# Patient Record
Sex: Female | Born: 1945 | Race: White | Hispanic: No | Smoking: Former smoker
Health system: Southern US, Community
[De-identification: ages and names within clinical notes are randomized; demographics above are authoritative.]

## PROBLEM LIST (undated history)

## (undated) DIAGNOSIS — E039 Hypothyroidism, unspecified: Secondary | ICD-10-CM

## (undated) DIAGNOSIS — C259 Malignant neoplasm of pancreas, unspecified: Secondary | ICD-10-CM

## (undated) DIAGNOSIS — K219 Gastro-esophageal reflux disease without esophagitis: Secondary | ICD-10-CM

## (undated) DIAGNOSIS — F419 Anxiety disorder, unspecified: Secondary | ICD-10-CM

## (undated) DIAGNOSIS — C73 Malignant neoplasm of thyroid gland: Secondary | ICD-10-CM

## (undated) DIAGNOSIS — Z8489 Family history of other specified conditions: Secondary | ICD-10-CM

## (undated) DIAGNOSIS — D649 Anemia, unspecified: Secondary | ICD-10-CM

## (undated) DIAGNOSIS — E119 Type 2 diabetes mellitus without complications: Secondary | ICD-10-CM

## (undated) DIAGNOSIS — C189 Malignant neoplasm of colon, unspecified: Secondary | ICD-10-CM

## (undated) DIAGNOSIS — I10 Essential (primary) hypertension: Secondary | ICD-10-CM

## (undated) DIAGNOSIS — C649 Malignant neoplasm of unspecified kidney, except renal pelvis: Secondary | ICD-10-CM

## (undated) HISTORY — DX: Malignant neoplasm of unspecified kidney, except renal pelvis: C64.9

## (undated) HISTORY — PX: COLON RESECTION: SHX5231

## (undated) HISTORY — PX: OTHER SURGICAL HISTORY: SHX169

## (undated) HISTORY — PX: CHOLECYSTECTOMY: SHX55

## (undated) HISTORY — PX: THYROID SURGERY: SHX805

---

## 2004-09-07 ENCOUNTER — Ambulatory Visit: Payer: Self-pay | Admitting: Internal Medicine

## 2005-03-09 ENCOUNTER — Ambulatory Visit: Payer: Self-pay | Admitting: Internal Medicine

## 2006-03-27 ENCOUNTER — Ambulatory Visit: Payer: Self-pay | Admitting: Internal Medicine

## 2006-04-25 ENCOUNTER — Ambulatory Visit: Payer: Self-pay | Admitting: Unknown Physician Specialty

## 2006-04-26 ENCOUNTER — Ambulatory Visit: Payer: Self-pay | Admitting: Unknown Physician Specialty

## 2006-04-30 ENCOUNTER — Ambulatory Visit: Payer: Self-pay | Admitting: Internal Medicine

## 2006-05-03 ENCOUNTER — Ambulatory Visit: Payer: Self-pay | Admitting: Surgery

## 2006-05-08 ENCOUNTER — Inpatient Hospital Stay: Payer: Self-pay | Admitting: Surgery

## 2006-05-21 ENCOUNTER — Ambulatory Visit: Payer: Self-pay | Admitting: Oncology

## 2006-05-28 ENCOUNTER — Ambulatory Visit: Payer: Self-pay | Admitting: Oncology

## 2006-08-14 ENCOUNTER — Ambulatory Visit: Payer: Self-pay | Admitting: Oncology

## 2006-08-20 ENCOUNTER — Ambulatory Visit: Payer: Self-pay | Admitting: Oncology

## 2006-08-27 ENCOUNTER — Ambulatory Visit: Payer: Self-pay | Admitting: Oncology

## 2007-01-03 ENCOUNTER — Ambulatory Visit: Payer: Self-pay | Admitting: Unknown Physician Specialty

## 2007-01-27 ENCOUNTER — Ambulatory Visit: Payer: Self-pay | Admitting: Oncology

## 2007-02-14 ENCOUNTER — Ambulatory Visit: Payer: Self-pay | Admitting: Oncology

## 2007-02-27 ENCOUNTER — Ambulatory Visit: Payer: Self-pay | Admitting: Oncology

## 2007-03-30 ENCOUNTER — Ambulatory Visit: Payer: Self-pay | Admitting: Oncology

## 2007-05-06 ENCOUNTER — Ambulatory Visit: Payer: Self-pay | Admitting: Internal Medicine

## 2007-05-06 ENCOUNTER — Ambulatory Visit: Payer: Self-pay | Admitting: Oncology

## 2007-05-13 ENCOUNTER — Ambulatory Visit: Payer: Self-pay | Admitting: Oncology

## 2007-05-28 ENCOUNTER — Ambulatory Visit: Payer: Self-pay | Admitting: Oncology

## 2007-06-27 ENCOUNTER — Ambulatory Visit: Payer: Self-pay | Admitting: Oncology

## 2007-07-28 ENCOUNTER — Ambulatory Visit: Payer: Self-pay | Admitting: Oncology

## 2007-08-26 ENCOUNTER — Ambulatory Visit: Payer: Self-pay | Admitting: Oncology

## 2007-08-27 ENCOUNTER — Ambulatory Visit: Payer: Self-pay | Admitting: Oncology

## 2007-11-27 ENCOUNTER — Ambulatory Visit: Payer: Self-pay | Admitting: Oncology

## 2007-12-14 ENCOUNTER — Ambulatory Visit: Payer: Self-pay | Admitting: Family Medicine

## 2007-12-28 ENCOUNTER — Ambulatory Visit: Payer: Self-pay | Admitting: Oncology

## 2008-05-06 ENCOUNTER — Ambulatory Visit: Payer: Self-pay | Admitting: Internal Medicine

## 2008-05-27 ENCOUNTER — Ambulatory Visit: Payer: Self-pay | Admitting: Oncology

## 2008-06-08 ENCOUNTER — Ambulatory Visit: Payer: Self-pay | Admitting: Oncology

## 2008-06-26 ENCOUNTER — Ambulatory Visit: Payer: Self-pay | Admitting: Oncology

## 2008-10-27 ENCOUNTER — Ambulatory Visit: Payer: Self-pay | Admitting: Oncology

## 2008-11-23 ENCOUNTER — Ambulatory Visit: Payer: Self-pay | Admitting: Oncology

## 2008-11-26 ENCOUNTER — Ambulatory Visit: Payer: Self-pay | Admitting: Oncology

## 2009-01-24 ENCOUNTER — Ambulatory Visit: Payer: Self-pay | Admitting: Unknown Physician Specialty

## 2009-04-26 ENCOUNTER — Ambulatory Visit: Payer: Self-pay | Admitting: Oncology

## 2009-05-10 ENCOUNTER — Ambulatory Visit: Payer: Self-pay | Admitting: Internal Medicine

## 2009-05-10 ENCOUNTER — Ambulatory Visit: Payer: Self-pay | Admitting: Oncology

## 2009-05-27 ENCOUNTER — Ambulatory Visit: Payer: Self-pay | Admitting: Oncology

## 2009-10-27 ENCOUNTER — Ambulatory Visit: Payer: Self-pay | Admitting: Oncology

## 2009-11-01 ENCOUNTER — Ambulatory Visit: Payer: Self-pay | Admitting: Oncology

## 2009-11-26 ENCOUNTER — Ambulatory Visit: Payer: Self-pay | Admitting: Oncology

## 2010-02-01 ENCOUNTER — Ambulatory Visit: Payer: Self-pay | Admitting: Oncology

## 2010-02-26 ENCOUNTER — Ambulatory Visit: Payer: Self-pay | Admitting: Oncology

## 2010-05-03 ENCOUNTER — Ambulatory Visit: Payer: Self-pay | Admitting: Oncology

## 2010-05-10 ENCOUNTER — Ambulatory Visit: Payer: Self-pay | Admitting: Oncology

## 2010-05-11 LAB — CEA: CEA: 1.6 ng/mL (ref 0.0–4.7)

## 2010-05-17 ENCOUNTER — Ambulatory Visit: Payer: Self-pay | Admitting: Unknown Physician Specialty

## 2010-05-28 ENCOUNTER — Ambulatory Visit: Payer: Self-pay | Admitting: Oncology

## 2010-05-30 ENCOUNTER — Ambulatory Visit: Payer: Self-pay | Admitting: Unknown Physician Specialty

## 2010-05-30 ENCOUNTER — Ambulatory Visit: Payer: Self-pay | Admitting: Surgery

## 2010-06-06 ENCOUNTER — Ambulatory Visit: Payer: Self-pay | Admitting: Surgery

## 2010-07-03 ENCOUNTER — Ambulatory Visit: Payer: Self-pay | Admitting: General Practice

## 2010-07-17 ENCOUNTER — Inpatient Hospital Stay: Payer: Self-pay | Admitting: General Practice

## 2010-11-15 ENCOUNTER — Ambulatory Visit: Payer: Self-pay | Admitting: Oncology

## 2010-11-27 ENCOUNTER — Ambulatory Visit: Payer: Self-pay | Admitting: Oncology

## 2011-01-05 ENCOUNTER — Ambulatory Visit: Payer: Self-pay | Admitting: Internal Medicine

## 2011-03-14 ENCOUNTER — Ambulatory Visit: Payer: Self-pay | Admitting: Oncology

## 2011-03-14 LAB — COMPREHENSIVE METABOLIC PANEL
Albumin: 3.7 g/dL (ref 3.4–5.0)
Anion Gap: 12 (ref 7–16)
BUN: 15 mg/dL (ref 7–18)
Calcium, Total: 9.2 mg/dL (ref 8.5–10.1)
EGFR (African American): 55 — ABNORMAL LOW
EGFR (Non-African Amer.): 45 — ABNORMAL LOW
Glucose: 198 mg/dL — ABNORMAL HIGH (ref 65–99)
Osmolality: 282 (ref 275–301)
Potassium: 4.4 mmol/L (ref 3.5–5.1)
Sodium: 138 mmol/L (ref 136–145)
Total Protein: 7.5 g/dL (ref 6.4–8.2)

## 2011-03-14 LAB — CBC CANCER CENTER
Basophil #: 0 x10 3/mm (ref 0.0–0.1)
Basophil %: 0.4 %
Eosinophil #: 0.2 x10 3/mm (ref 0.0–0.7)
Eosinophil %: 1.6 %
HGB: 11.4 g/dL — ABNORMAL LOW (ref 12.0–16.0)
Lymphocyte #: 1.7 x10 3/mm (ref 1.0–3.6)
Lymphocyte %: 18 %
Monocyte #: 0.3 x10 3/mm (ref 0.0–0.7)
Monocyte %: 3.2 %
Neutrophil %: 76.8 %
Platelet: 265 x10 3/mm (ref 150–440)
RBC: 4.34 10*6/uL (ref 3.80–5.20)
WBC: 9.6 x10 3/mm (ref 3.6–11.0)

## 2011-03-30 ENCOUNTER — Ambulatory Visit: Payer: Self-pay | Admitting: Oncology

## 2011-06-05 ENCOUNTER — Ambulatory Visit: Payer: Self-pay | Admitting: Internal Medicine

## 2011-09-21 ENCOUNTER — Ambulatory Visit: Payer: Self-pay | Admitting: Oncology

## 2011-09-21 LAB — CBC CANCER CENTER
Basophil %: 0.8 %
Eosinophil %: 1.9 %
HCT: 33 % — ABNORMAL LOW (ref 35.0–47.0)
Lymphocyte #: 1.8 x10 3/mm (ref 1.0–3.6)
MCHC: 32.3 g/dL (ref 32.0–36.0)
MCV: 84 fL (ref 80–100)
Monocyte #: 0.4 x10 3/mm (ref 0.2–0.9)
Neutrophil #: 6.5 x10 3/mm (ref 1.4–6.5)
Platelet: 210 x10 3/mm (ref 150–440)
RBC: 3.93 10*6/uL (ref 3.80–5.20)
WBC: 8.8 x10 3/mm (ref 3.6–11.0)

## 2011-09-23 LAB — CEA: CEA: 1.4 ng/mL (ref 0.0–4.7)

## 2011-09-27 ENCOUNTER — Ambulatory Visit: Payer: Self-pay | Admitting: Oncology

## 2012-03-21 ENCOUNTER — Ambulatory Visit: Payer: Self-pay | Admitting: Oncology

## 2012-03-21 LAB — CBC CANCER CENTER
Basophil #: 0.1 x10 3/mm (ref 0.0–0.1)
Basophil %: 1.4 %
HCT: 36.5 % (ref 35.0–47.0)
HGB: 11.6 g/dL — ABNORMAL LOW (ref 12.0–16.0)
Lymphocyte #: 1.4 x10 3/mm (ref 1.0–3.6)
Lymphocyte %: 12.8 %
MCH: 26.3 pg (ref 26.0–34.0)
Monocyte #: 0.4 x10 3/mm (ref 0.2–0.9)
Monocyte %: 3.4 %
RBC: 4.4 10*6/uL (ref 3.80–5.20)
WBC: 10.8 x10 3/mm (ref 3.6–11.0)

## 2012-03-21 LAB — COMPREHENSIVE METABOLIC PANEL
Albumin: 3.9 g/dL (ref 3.4–5.0)
Alkaline Phosphatase: 79 U/L (ref 50–136)
Anion Gap: 12 (ref 7–16)
BUN: 16 mg/dL (ref 7–18)
Bilirubin,Total: 0.3 mg/dL (ref 0.2–1.0)
Chloride: 103 mmol/L (ref 98–107)
Co2: 25 mmol/L (ref 21–32)
Creatinine: 1.3 mg/dL (ref 0.60–1.30)
EGFR (Non-African Amer.): 42 — ABNORMAL LOW
Glucose: 221 mg/dL — ABNORMAL HIGH (ref 65–99)
Osmolality: 287 (ref 275–301)
Potassium: 4.9 mmol/L (ref 3.5–5.1)
SGPT (ALT): 20 U/L (ref 12–78)
Total Protein: 7.7 g/dL (ref 6.4–8.2)

## 2012-03-24 LAB — CEA: CEA: 1.8 ng/mL (ref 0.0–4.7)

## 2012-03-29 ENCOUNTER — Ambulatory Visit: Payer: Self-pay | Admitting: Oncology

## 2012-06-05 ENCOUNTER — Ambulatory Visit: Payer: Self-pay | Admitting: Internal Medicine

## 2012-06-06 ENCOUNTER — Ambulatory Visit: Payer: Self-pay | Admitting: Internal Medicine

## 2012-06-24 ENCOUNTER — Ambulatory Visit: Payer: Self-pay | Admitting: Internal Medicine

## 2012-06-27 ENCOUNTER — Ambulatory Visit: Payer: Self-pay | Admitting: Internal Medicine

## 2012-06-30 ENCOUNTER — Ambulatory Visit: Payer: Self-pay | Admitting: Unknown Physician Specialty

## 2012-07-01 LAB — PATHOLOGY REPORT

## 2012-09-10 ENCOUNTER — Ambulatory Visit: Payer: Self-pay | Admitting: Oncology

## 2012-09-12 ENCOUNTER — Ambulatory Visit: Payer: Self-pay | Admitting: Oncology

## 2012-09-12 LAB — CBC CANCER CENTER
Basophil #: 0.1 x10 3/mm (ref 0.0–0.1)
Eosinophil %: 2.1 %
HCT: 32.2 % — ABNORMAL LOW (ref 35.0–47.0)
HGB: 10.6 g/dL — ABNORMAL LOW (ref 12.0–16.0)
MCV: 80 fL (ref 80–100)
Monocyte %: 5 %
Neutrophil #: 6.3 x10 3/mm (ref 1.4–6.5)
Neutrophil %: 74.7 %
WBC: 8.5 x10 3/mm (ref 3.6–11.0)

## 2012-09-12 LAB — COMPREHENSIVE METABOLIC PANEL
Albumin: 3.9 g/dL (ref 3.4–5.0)
Alkaline Phosphatase: 70 U/L (ref 50–136)
Anion Gap: 10 (ref 7–16)
BUN: 15 mg/dL (ref 7–18)
Bilirubin,Total: 0.3 mg/dL (ref 0.2–1.0)
Chloride: 104 mmol/L (ref 98–107)
Creatinine: 1.29 mg/dL (ref 0.60–1.30)
EGFR (African American): 49 — ABNORMAL LOW
Osmolality: 287 (ref 275–301)
Potassium: 5.2 mmol/L — ABNORMAL HIGH (ref 3.5–5.1)
SGOT(AST): 22 U/L (ref 15–37)
Sodium: 139 mmol/L (ref 136–145)
Total Protein: 7.7 g/dL (ref 6.4–8.2)

## 2012-09-13 LAB — CEA: CEA: 1.5 ng/mL (ref 0.0–4.7)

## 2012-09-26 ENCOUNTER — Ambulatory Visit: Payer: Self-pay | Admitting: Oncology

## 2013-03-13 ENCOUNTER — Ambulatory Visit: Payer: Self-pay | Admitting: Oncology

## 2013-03-13 LAB — COMPREHENSIVE METABOLIC PANEL
AST: 24 U/L (ref 15–37)
Albumin: 3.7 g/dL (ref 3.4–5.0)
Alkaline Phosphatase: 68 U/L
Anion Gap: 10 (ref 7–16)
BUN: 15 mg/dL (ref 7–18)
Bilirubin,Total: 0.3 mg/dL (ref 0.2–1.0)
CO2: 24 mmol/L (ref 21–32)
CREATININE: 1.26 mg/dL (ref 0.60–1.30)
Calcium, Total: 9 mg/dL (ref 8.5–10.1)
Chloride: 100 mmol/L (ref 98–107)
EGFR (African American): 50 — ABNORMAL LOW
EGFR (Non-African Amer.): 43 — ABNORMAL LOW
Glucose: 309 mg/dL — ABNORMAL HIGH (ref 65–99)
Osmolality: 281 (ref 275–301)
Potassium: 5.1 mmol/L (ref 3.5–5.1)
SGPT (ALT): 23 U/L (ref 12–78)
SODIUM: 134 mmol/L — AB (ref 136–145)
TOTAL PROTEIN: 7.3 g/dL (ref 6.4–8.2)

## 2013-03-13 LAB — CBC CANCER CENTER
BASOS PCT: 1 %
Basophil #: 0.1 x10 3/mm (ref 0.0–0.1)
Eosinophil #: 0.2 x10 3/mm (ref 0.0–0.7)
Eosinophil %: 1.6 %
HCT: 28.3 % — ABNORMAL LOW (ref 35.0–47.0)
HGB: 8.7 g/dL — ABNORMAL LOW (ref 12.0–16.0)
LYMPHS PCT: 14.4 %
Lymphocyte #: 1.4 x10 3/mm (ref 1.0–3.6)
MCH: 23.1 pg — ABNORMAL LOW (ref 26.0–34.0)
MCHC: 30.8 g/dL — ABNORMAL LOW (ref 32.0–36.0)
MCV: 75 fL — ABNORMAL LOW (ref 80–100)
MONO ABS: 0.4 x10 3/mm (ref 0.2–0.9)
Monocyte %: 4.4 %
NEUTROS ABS: 7.8 x10 3/mm — AB (ref 1.4–6.5)
Neutrophil %: 78.6 %
Platelet: 242 x10 3/mm (ref 150–440)
RBC: 3.77 10*6/uL — ABNORMAL LOW (ref 3.80–5.20)
RDW: 16.1 % — ABNORMAL HIGH (ref 11.5–14.5)
WBC: 10 x10 3/mm (ref 3.6–11.0)

## 2013-03-29 ENCOUNTER — Ambulatory Visit: Payer: Self-pay | Admitting: Oncology

## 2013-06-05 ENCOUNTER — Ambulatory Visit: Payer: Self-pay | Admitting: Oncology

## 2013-06-08 ENCOUNTER — Ambulatory Visit: Payer: Self-pay | Admitting: Internal Medicine

## 2013-10-02 ENCOUNTER — Ambulatory Visit: Payer: Self-pay | Admitting: Oncology

## 2013-10-02 LAB — COMPREHENSIVE METABOLIC PANEL
ANION GAP: 10 (ref 7–16)
Albumin: 3.6 g/dL (ref 3.4–5.0)
Alkaline Phosphatase: 62 U/L
BUN: 12 mg/dL (ref 7–18)
Bilirubin,Total: 0.3 mg/dL (ref 0.2–1.0)
CHLORIDE: 101 mmol/L (ref 98–107)
CO2: 26 mmol/L (ref 21–32)
Calcium, Total: 8.8 mg/dL (ref 8.5–10.1)
Creatinine: 1.12 mg/dL (ref 0.60–1.30)
EGFR (African American): 58 — ABNORMAL LOW
EGFR (Non-African Amer.): 50 — ABNORMAL LOW
GLUCOSE: 293 mg/dL — AB (ref 65–99)
Osmolality: 284 (ref 275–301)
POTASSIUM: 5 mmol/L (ref 3.5–5.1)
SGOT(AST): 28 U/L (ref 15–37)
SGPT (ALT): 22 U/L
Sodium: 137 mmol/L (ref 136–145)
TOTAL PROTEIN: 7.5 g/dL (ref 6.4–8.2)

## 2013-10-02 LAB — CBC CANCER CENTER
BASOS ABS: 0.1 x10 3/mm (ref 0.0–0.1)
Basophil %: 0.8 %
Eosinophil #: 0.1 x10 3/mm (ref 0.0–0.7)
Eosinophil %: 1.5 %
HCT: 29.2 % — ABNORMAL LOW (ref 35.0–47.0)
HGB: 9.1 g/dL — ABNORMAL LOW (ref 12.0–16.0)
LYMPHS ABS: 1.3 x10 3/mm (ref 1.0–3.6)
Lymphocyte %: 14.1 %
MCH: 23.8 pg — AB (ref 26.0–34.0)
MCHC: 31.1 g/dL — AB (ref 32.0–36.0)
MCV: 77 fL — ABNORMAL LOW (ref 80–100)
MONOS PCT: 4.5 %
Monocyte #: 0.4 x10 3/mm (ref 0.2–0.9)
NEUTROS ABS: 7 x10 3/mm — AB (ref 1.4–6.5)
Neutrophil %: 79.1 %
PLATELETS: 235 x10 3/mm (ref 150–440)
RBC: 3.81 10*6/uL (ref 3.80–5.20)
RDW: 19.5 % — AB (ref 11.5–14.5)
WBC: 8.9 x10 3/mm (ref 3.6–11.0)

## 2013-10-02 LAB — FERRITIN: Ferritin (ARMC): 8 ng/mL (ref 8–388)

## 2013-10-02 LAB — IRON AND TIBC
IRON BIND. CAP.(TOTAL): 539 ug/dL — AB (ref 250–450)
IRON: 55 ug/dL (ref 50–170)
Iron Saturation: 10 %
Unbound Iron-Bind.Cap.: 484 ug/dL

## 2013-10-27 ENCOUNTER — Ambulatory Visit: Payer: Self-pay | Admitting: Oncology

## 2013-12-25 ENCOUNTER — Ambulatory Visit: Payer: Self-pay | Admitting: Unknown Physician Specialty

## 2013-12-31 ENCOUNTER — Ambulatory Visit: Payer: Self-pay | Admitting: Oncology

## 2013-12-31 LAB — COMPREHENSIVE METABOLIC PANEL
Albumin: 3.5 g/dL (ref 3.4–5.0)
Alkaline Phosphatase: 69 U/L
Anion Gap: 13 (ref 7–16)
BUN: 15 mg/dL (ref 7–18)
Bilirubin,Total: 0.5 mg/dL (ref 0.2–1.0)
CHLORIDE: 99 mmol/L (ref 98–107)
CO2: 24 mmol/L (ref 21–32)
Calcium, Total: 9.9 mg/dL (ref 8.5–10.1)
Creatinine: 1.03 mg/dL (ref 0.60–1.30)
EGFR (Non-African Amer.): 56 — ABNORMAL LOW
GLUCOSE: 226 mg/dL — AB (ref 65–99)
OSMOLALITY: 280 (ref 275–301)
Potassium: 4.8 mmol/L (ref 3.5–5.1)
SGOT(AST): 18 U/L (ref 15–37)
SGPT (ALT): 21 U/L
Sodium: 136 mmol/L (ref 136–145)
Total Protein: 7.5 g/dL (ref 6.4–8.2)

## 2013-12-31 LAB — IRON AND TIBC
IRON SATURATION: 9 %
IRON: 35 ug/dL — AB (ref 50–170)
Iron Bind.Cap.(Total): 408 ug/dL (ref 250–450)
UNBOUND IRON-BIND. CAP.: 373 ug/dL

## 2013-12-31 LAB — CBC CANCER CENTER
BASOS ABS: 0.1 x10 3/mm (ref 0.0–0.1)
BASOS PCT: 0.8 %
EOS PCT: 1.2 %
Eosinophil #: 0.1 x10 3/mm (ref 0.0–0.7)
HCT: 36.1 % (ref 35.0–47.0)
HGB: 11.6 g/dL — ABNORMAL LOW (ref 12.0–16.0)
LYMPHS ABS: 1.3 x10 3/mm (ref 1.0–3.6)
Lymphocyte %: 12.3 %
MCH: 27.7 pg (ref 26.0–34.0)
MCHC: 32.2 g/dL (ref 32.0–36.0)
MCV: 86 fL (ref 80–100)
Monocyte #: 0.5 x10 3/mm (ref 0.2–0.9)
Monocyte %: 4.8 %
NEUTROS ABS: 8.4 x10 3/mm — AB (ref 1.4–6.5)
Neutrophil %: 80.9 %
PLATELETS: 202 x10 3/mm (ref 150–440)
RBC: 4.19 10*6/uL (ref 3.80–5.20)
RDW: 16.3 % — AB (ref 11.5–14.5)
WBC: 10.4 x10 3/mm (ref 3.6–11.0)

## 2013-12-31 LAB — FERRITIN: FERRITIN (ARMC): 37 ng/mL (ref 8–388)

## 2014-01-26 ENCOUNTER — Ambulatory Visit: Payer: Self-pay | Admitting: Oncology

## 2014-02-02 ENCOUNTER — Ambulatory Visit: Payer: Self-pay | Admitting: Surgery

## 2014-02-02 LAB — PROTIME-INR
INR: 1.1
PROTHROMBIN TIME: 13.9 s (ref 11.5–14.7)

## 2014-02-02 LAB — CBC WITH DIFFERENTIAL/PLATELET
BASOS ABS: 0.1 10*3/uL (ref 0.0–0.1)
BASOS PCT: 0.9 %
Eosinophil #: 0.1 10*3/uL (ref 0.0–0.7)
Eosinophil %: 1.6 %
HCT: 36.2 % (ref 35.0–47.0)
HGB: 11.7 g/dL — AB (ref 12.0–16.0)
LYMPHS ABS: 1.8 10*3/uL (ref 1.0–3.6)
Lymphocyte %: 19.5 %
MCH: 28 pg (ref 26.0–34.0)
MCHC: 32.3 g/dL (ref 32.0–36.0)
MCV: 87 fL (ref 80–100)
MONO ABS: 0.4 x10 3/mm (ref 0.2–0.9)
MONOS PCT: 4.5 %
NEUTROS PCT: 73.5 %
Neutrophil #: 6.8 10*3/uL — ABNORMAL HIGH (ref 1.4–6.5)
Platelet: 224 10*3/uL (ref 150–440)
RBC: 4.19 10*6/uL (ref 3.80–5.20)
RDW: 14.2 % (ref 11.5–14.5)
WBC: 9.2 10*3/uL (ref 3.6–11.0)

## 2014-02-02 LAB — BASIC METABOLIC PANEL
ANION GAP: 11 (ref 7–16)
BUN: 15 mg/dL (ref 7–18)
CALCIUM: 8.6 mg/dL (ref 8.5–10.1)
Chloride: 105 mmol/L (ref 98–107)
Co2: 23 mmol/L (ref 21–32)
Creatinine: 1.09 mg/dL (ref 0.60–1.30)
EGFR (African American): >60
EGFR (Non-African Amer.): 53 — ABNORMAL LOW
Glucose: 153 mg/dL — ABNORMAL HIGH (ref 65–99)
Osmolality: 281 (ref 275–301)
POTASSIUM: 4.6 mmol/L (ref 3.5–5.1)
Sodium: 139 mmol/L (ref 136–145)

## 2014-02-02 LAB — APTT: Activated PTT: 31.4 secs (ref 23.6–35.9)

## 2014-02-02 LAB — HEPATIC FUNCTION PANEL A (ARMC)
ALK PHOS: 72 U/L
Albumin: 3.6 g/dL (ref 3.4–5.0)
Bilirubin, Direct: <0.1 mg/dL (ref 0.0–0.2)
Bilirubin,Total: 0.2 mg/dL (ref 0.2–1.0)
SGOT(AST): 35 U/L (ref 15–37)
SGPT (ALT): 27 U/L
Total Protein: 7.3 g/dL (ref 6.4–8.2)

## 2014-02-04 DIAGNOSIS — D638 Anemia in other chronic diseases classified elsewhere: Secondary | ICD-10-CM | POA: Insufficient documentation

## 2014-02-09 ENCOUNTER — Ambulatory Visit: Payer: Self-pay | Admitting: Surgery

## 2014-02-10 LAB — ALBUMIN: ALBUMIN: 3.1 g/dL — AB (ref 3.4–5.0)

## 2014-02-10 LAB — CALCIUM: Calcium, Total: 8.1 mg/dL — ABNORMAL LOW (ref 8.5–10.1)

## 2014-03-03 ENCOUNTER — Ambulatory Visit: Payer: Self-pay | Admitting: Oncology

## 2014-03-05 ENCOUNTER — Ambulatory Visit: Payer: Self-pay | Admitting: Oncology

## 2014-03-05 LAB — CBC CANCER CENTER
BASOS ABS: 0.1 x10 3/mm (ref 0.0–0.1)
Basophil %: 1.1 %
EOS ABS: 0.3 x10 3/mm (ref 0.0–0.7)
Eosinophil %: 2.9 %
HCT: 34.9 % — ABNORMAL LOW (ref 35.0–47.0)
HGB: 11.2 g/dL — ABNORMAL LOW (ref 12.0–16.0)
LYMPHS PCT: 16.6 %
Lymphocyte #: 1.7 x10 3/mm (ref 1.0–3.6)
MCH: 27.2 pg (ref 26.0–34.0)
MCHC: 32.1 g/dL (ref 32.0–36.0)
MCV: 85 fL (ref 80–100)
MONO ABS: 0.4 x10 3/mm (ref 0.2–0.9)
Monocyte %: 3.8 %
NEUTROS PCT: 75.6 %
Neutrophil #: 7.9 x10 3/mm — ABNORMAL HIGH (ref 1.4–6.5)
PLATELETS: 217 x10 3/mm (ref 150–440)
RBC: 4.13 10*6/uL (ref 3.80–5.20)
RDW: 15.4 % — AB (ref 11.5–14.5)
WBC: 10.5 x10 3/mm (ref 3.6–11.0)

## 2014-03-05 LAB — BASIC METABOLIC PANEL
Anion Gap: 10 (ref 7–16)
BUN: 20 mg/dL — ABNORMAL HIGH (ref 7–18)
CHLORIDE: 103 mmol/L (ref 98–107)
CO2: 25 mmol/L (ref 21–32)
CREATININE: 1.46 mg/dL — AB (ref 0.60–1.30)
Calcium, Total: 9.5 mg/dL (ref 8.5–10.1)
EGFR (African American): 46 — ABNORMAL LOW
EGFR (Non-African Amer.): 38 — ABNORMAL LOW
Glucose: 200 mg/dL — ABNORMAL HIGH (ref 65–99)
Osmolality: 284 (ref 275–301)
Potassium: 4.6 mmol/L (ref 3.5–5.1)
SODIUM: 138 mmol/L (ref 136–145)

## 2014-03-29 ENCOUNTER — Ambulatory Visit: Payer: Self-pay | Admitting: Oncology

## 2014-03-30 ENCOUNTER — Ambulatory Visit: Payer: Self-pay | Admitting: Internal Medicine

## 2014-06-19 NOTE — Op Note (Signed)
PATIENT NAME:  Rebecca Kirby, Rebecca Kirby MR#:  [id number] DATE OF BIRTH:  May 12, 1945  DATE OF PROCEDURE:  02/09/2014  PREOPERATIVE DIAGNOSIS: Thyroid nodules.   POSTOPERATIVE DIAGNOSIS:  Thyroid nodules.  OPERATION PERFORMED:  1.  Total thyroidectomy.  2.  Reimplantation of right superior parathyroid into right sternocleidomastoid muscle.   SURGEON: Consuela Mimes, M.D.   FIRST ASSISTANT:  Dr. Marlyce Huge.   ANESTHESIA: General.   PROCEDURE IN DETAIL: The patient was placed supine on the Operating Room table and prepped and draped in the usual sterile fashion. A curvilinear incision was made 2 fingerbreadths above the suprasternal notch in the neck and carried down through the skin and subcutaneous tissue and platysma with electrocautery. Subplatysmal flaps are created superiorly and inferiorly and the intermediate fascia was opened in the midline between the strap muscles. Strap muscles were dissected off of the thyroid lobes bilaterally, and they were densely adherent on both sides with some reactive vasculature, likely due to the fine needle aspirates. The superior pole vessels were ligated and divided with the Harmonic scalpel. On the right one of the superior pole vessels was also clipped with a medium Hemoclip as it was quite sizable. The middle thyroid veins were ligated and divided with the Harmonic scalpel and inferior pole vessels were treated identically. As the thyroid lobes were rotated medially the recurrent laryngeal nerve was identified and spared throughout the procedure. No energy devices were used near the nerve. Rather, medium and small hemoclips were used as well as the Harmonic scalpel and the bipolar electrocautery, when necessary. The entire thyroid was removed, including some nodules within the isthmus and a pyramidal lobe and passed off the table as a specimen. Hemostasis was excellent and was aided with topical thrombin-soaked Gelfoam pads that were placed right on the  recurrent laryngeal nerves but were removed prior to closure. On the right side the right superior parathyroid gland was excised with the specimen and therefore, it was elected to reimplant this even though the remaining three parathyroid glands remained intact in the patient. After a small TLS drain was placed on both sides of the neck and brought out through a stab incision in the suprasternal notch and the intermediate fascia was closed with a running 3-0 Monocryl suture, and then the right superior parathyroid gland was minced into 1 mm cubes and placed into a pocket in the right sternocleidomastoid muscle and the fascia over top of this pocket was closed with a running 5-0 Prolene suture. The platysma was then closed with a running 3-0 Monocryl suture and the skin was reapproximated with a running subcuticular 5-0 Monocryl and suture strips. The patient tolerated the procedure well. There were no complications.     ____________________________ Consuela Mimes, MD wfm:at D: 02/09/2014 15:45:19 ET T: 02/09/2014 15:56:51 ET JOB#: [id number]  cc: Consuela Mimes, MD, <Dictator> A. Lavone Orn, MD Consuela Mimes MD ELECTRONICALLY SIGNED 02/12/2014 9:38

## 2014-06-21 LAB — SURGICAL PATHOLOGY

## 2014-07-28 ENCOUNTER — Inpatient Hospital Stay: Admission: RE | Admit: 2014-07-28 | Payer: Self-pay | Source: Ambulatory Visit

## 2014-07-29 ENCOUNTER — Encounter
Admission: RE | Admit: 2014-07-29 | Discharge: 2014-07-29 | Disposition: A | Discharge status: Home / Self Care | Payer: Medicare Other | Source: Ambulatory Visit | Attending: Orthopedic Surgery | Admitting: Orthopedic Surgery

## 2014-07-29 DIAGNOSIS — Z01810 Encounter for preprocedural cardiovascular examination: Secondary | ICD-10-CM | POA: Insufficient documentation

## 2014-07-29 DIAGNOSIS — Z01812 Encounter for preprocedural laboratory examination: Secondary | ICD-10-CM | POA: Insufficient documentation

## 2014-07-29 DIAGNOSIS — M179 Osteoarthritis of knee, unspecified: Secondary | ICD-10-CM | POA: Insufficient documentation

## 2014-07-29 HISTORY — DX: Gastro-esophageal reflux disease without esophagitis: K21.9

## 2014-07-29 HISTORY — DX: Essential (primary) hypertension: I10

## 2014-07-29 HISTORY — DX: Anxiety disorder, unspecified: F41.9

## 2014-07-29 HISTORY — DX: Hypothyroidism, unspecified: E03.9

## 2014-07-29 HISTORY — DX: Anemia, unspecified: D64.9

## 2014-07-29 HISTORY — DX: Type 2 diabetes mellitus without complications: E11.9

## 2014-07-29 LAB — URINALYSIS COMPLETE WITH MICROSCOPIC (ARMC ONLY)
BILIRUBIN URINE: NEGATIVE
Glucose, UA: NEGATIVE mg/dL
Hgb urine dipstick: NEGATIVE
Ketones, ur: NEGATIVE mg/dL
NITRITE: NEGATIVE
Protein, ur: NEGATIVE mg/dL
Specific Gravity, Urine: 1.015 (ref 1.005–1.030)
pH: 5 (ref 5.0–8.0)

## 2014-07-29 LAB — BASIC METABOLIC PANEL
Anion gap: 9 (ref 5–15)
BUN: 17 mg/dL (ref 6–20)
CALCIUM: 7.9 mg/dL — AB (ref 8.9–10.3)
CO2: 27 mmol/L (ref 22–32)
Chloride: 104 mmol/L (ref 101–111)
Creatinine, Ser: 1.03 mg/dL — ABNORMAL HIGH (ref 0.44–1.00)
GFR, EST NON AFRICAN AMERICAN: 53 mL/min — AB (ref >60)
Glucose, Bld: 101 mg/dL — ABNORMAL HIGH (ref 65–99)
Potassium: 4.5 mmol/L (ref 3.5–5.1)
Sodium: 140 mmol/L (ref 135–145)

## 2014-07-29 LAB — TYPE AND SCREEN
ABO/RH(D): A POS
ANTIBODY SCREEN: NEGATIVE

## 2014-07-29 LAB — SURGICAL PCR SCREEN
MRSA, PCR: NEGATIVE
STAPHYLOCOCCUS AUREUS: NEGATIVE

## 2014-07-29 LAB — CBC
HCT: 32.7 % — ABNORMAL LOW (ref 35.0–47.0)
HEMOGLOBIN: 10.4 g/dL — AB (ref 12.0–16.0)
MCH: 25.8 pg — ABNORMAL LOW (ref 26.0–34.0)
MCHC: 31.7 g/dL — AB (ref 32.0–36.0)
MCV: 81.5 fL (ref 80.0–100.0)
Platelets: 221 10*3/uL (ref 150–440)
RBC: 4.01 MIL/uL (ref 3.80–5.20)
RDW: 16.5 % — ABNORMAL HIGH (ref 11.5–14.5)
WBC: 10.7 10*3/uL (ref 3.6–11.0)

## 2014-07-29 LAB — PROTIME-INR
INR: 1.08
Prothrombin Time: 14.2 seconds (ref 11.4–15.0)

## 2014-07-29 LAB — ABO/RH: ABO/RH(D): A POS

## 2014-07-29 LAB — SEDIMENTATION RATE: SED RATE: 39 mm/h — AB (ref 0–30)

## 2014-07-29 LAB — APTT: APTT: 31 s (ref 24–36)

## 2014-07-29 NOTE — Patient Instructions (Signed)
  Your procedure is scheduled on: 08/09/14 Monday Report to Day Surgery. To find out your arrival time please call [phone] between 1PM - 3PM on Fri 08/06/14 Remember: Instructions that are not followed completely may result in serious medical risk, up to and including death, or upon the discretion of your surgeon and anesthesiologist your surgery may need to be rescheduled.    __x__ 1. Do not eat food or drink liquids after midnight. No gum chewing or hard candies.     ____ 2. No Alcohol for 24 hours before or after surgery.   ____ 3. Bring all medications with you on the day of surgery if instructed.    _x___ 4. Notify your doctor if there is any change in your medical condition     (cold, fever, infections).     Do not wear jewelry, make-up, hairpins, clips or nail polish.  Do not wear lotions, powders, or perfumes. You may wear deodorant.  Do not shave 48 hours prior to surgery. Men may shave face and neck.  Do not bring valuables to the hospital.    [facility] is not responsible for any belongings or valuables.               Contacts, dentures or bridgework may not be worn into surgery.  Leave your suitcase in the car. After surgery it may be brought to your room.  For patients admitted to the hospital, discharge time is determined by your                treatment team.   Patients discharged the day of surgery will not be allowed to drive home.   Please read over the following fact sheets that you were given:   MRSA Information   ____ Take these medicines the morning of surgery with A SIP OF WATER:    1. xanax  2. celexa  3. lexapro  4.synthroid  5.protonix  6.  ____ Fleet Enema (as directed)   ____ Use CHG Soap as directed  ____ Use inhalers on the day of surgery  __x__ Stop metformin 2 days prior to surgery    ____ Take 1/2 of usual insulin dose the night before surgery and none on the morning of surgery.   ____ Stop Coumadin/Plavix/aspirin on   ____  Stop Anti-inflammatories on    ____ Stop supplements until after surgery.    ____ Bring C-Pap to the hospital.

## 2014-07-30 LAB — HEMOGLOBIN A1C: HEMOGLOBIN A1C: 7.3 % — AB (ref 4.0–6.0)

## 2014-07-31 LAB — URINE CULTURE: Culture: >=100000

## 2014-08-02 NOTE — OR Nursing (Signed)
Labs faxed to Dr Marry Guan

## 2014-08-09 ENCOUNTER — Inpatient Hospital Stay: Payer: Medicare Other

## 2014-08-09 ENCOUNTER — Inpatient Hospital Stay: Payer: Medicare Other | Admitting: Anesthesiology

## 2014-08-09 ENCOUNTER — Encounter
Admission: RE | Disposition: A | Discharge status: Home Health | Payer: Self-pay | Source: Ambulatory Visit | Attending: Orthopedic Surgery

## 2014-08-09 ENCOUNTER — Encounter: Payer: Self-pay | Admitting: *Deleted

## 2014-08-09 ENCOUNTER — Inpatient Hospital Stay
Admission: RE | Admit: 2014-08-09 | Discharge: 2014-08-12 | DRG: 470 | Disposition: A | Discharge status: Home Health | Payer: Medicare Other | Source: Ambulatory Visit | Attending: Orthopedic Surgery | Admitting: Orthopedic Surgery

## 2014-08-09 DIAGNOSIS — Z86010 Personal history of colonic polyps: Secondary | ICD-10-CM | POA: Diagnosis not present

## 2014-08-09 DIAGNOSIS — Z85038 Personal history of other malignant neoplasm of large intestine: Secondary | ICD-10-CM | POA: Diagnosis not present

## 2014-08-09 DIAGNOSIS — Z96651 Presence of right artificial knee joint: Secondary | ICD-10-CM | POA: Diagnosis present

## 2014-08-09 DIAGNOSIS — E039 Hypothyroidism, unspecified: Secondary | ICD-10-CM | POA: Diagnosis present

## 2014-08-09 DIAGNOSIS — Z87891 Personal history of nicotine dependence: Secondary | ICD-10-CM | POA: Diagnosis not present

## 2014-08-09 DIAGNOSIS — Z833 Family history of diabetes mellitus: Secondary | ICD-10-CM

## 2014-08-09 DIAGNOSIS — Z79899 Other long term (current) drug therapy: Secondary | ICD-10-CM

## 2014-08-09 DIAGNOSIS — E559 Vitamin D deficiency, unspecified: Secondary | ICD-10-CM | POA: Diagnosis present

## 2014-08-09 DIAGNOSIS — Z96659 Presence of unspecified artificial knee joint: Secondary | ICD-10-CM | POA: Insufficient documentation

## 2014-08-09 DIAGNOSIS — W548XXA Other contact with dog, initial encounter: Secondary | ICD-10-CM | POA: Diagnosis present

## 2014-08-09 DIAGNOSIS — E1121 Type 2 diabetes mellitus with diabetic nephropathy: Secondary | ICD-10-CM | POA: Diagnosis present

## 2014-08-09 DIAGNOSIS — N289 Disorder of kidney and ureter, unspecified: Secondary | ICD-10-CM | POA: Diagnosis present

## 2014-08-09 DIAGNOSIS — Z8249 Family history of ischemic heart disease and other diseases of the circulatory system: Secondary | ICD-10-CM

## 2014-08-09 DIAGNOSIS — D649 Anemia, unspecified: Secondary | ICD-10-CM | POA: Diagnosis present

## 2014-08-09 DIAGNOSIS — S80212A Abrasion, left knee, initial encounter: Secondary | ICD-10-CM | POA: Diagnosis present

## 2014-08-09 DIAGNOSIS — M170 Bilateral primary osteoarthritis of knee: Secondary | ICD-10-CM | POA: Diagnosis present

## 2014-08-09 DIAGNOSIS — M179 Osteoarthritis of knee, unspecified: Secondary | ICD-10-CM | POA: Diagnosis present

## 2014-08-09 DIAGNOSIS — K219 Gastro-esophageal reflux disease without esophagitis: Secondary | ICD-10-CM | POA: Diagnosis present

## 2014-08-09 DIAGNOSIS — Z85528 Personal history of other malignant neoplasm of kidney: Secondary | ICD-10-CM

## 2014-08-09 DIAGNOSIS — Z905 Acquired absence of kidney: Secondary | ICD-10-CM | POA: Diagnosis present

## 2014-08-09 DIAGNOSIS — I10 Essential (primary) hypertension: Secondary | ICD-10-CM | POA: Diagnosis present

## 2014-08-09 DIAGNOSIS — F419 Anxiety disorder, unspecified: Secondary | ICD-10-CM | POA: Diagnosis present

## 2014-08-09 DIAGNOSIS — Z79891 Long term (current) use of opiate analgesic: Secondary | ICD-10-CM

## 2014-08-09 DIAGNOSIS — Z9049 Acquired absence of other specified parts of digestive tract: Secondary | ICD-10-CM | POA: Diagnosis present

## 2014-08-09 DIAGNOSIS — M1712 Unilateral primary osteoarthritis, left knee: Secondary | ICD-10-CM | POA: Diagnosis present

## 2014-08-09 DIAGNOSIS — Z825 Family history of asthma and other chronic lower respiratory diseases: Secondary | ICD-10-CM

## 2014-08-09 DIAGNOSIS — Z96652 Presence of left artificial knee joint: Secondary | ICD-10-CM

## 2014-08-09 HISTORY — PX: TOTAL KNEE ARTHROPLASTY: SHX125

## 2014-08-09 LAB — GLUCOSE, CAPILLARY
GLUCOSE-CAPILLARY: 232 mg/dL — AB (ref 65–99)
GLUCOSE-CAPILLARY: 200 mg/dL — AB (ref 65–99)
Glucose-Capillary: 166 mg/dL — ABNORMAL HIGH (ref 65–99)
Glucose-Capillary: 218 mg/dL — ABNORMAL HIGH (ref 65–99)

## 2014-08-09 SURGERY — ARTHROPLASTY, KNEE, TOTAL
Anesthesia: Spinal | Site: Knee | Laterality: Left | Wound class: Clean

## 2014-08-09 MED ORDER — ACETAMINOPHEN 10 MG/ML IV SOLN
INTRAVENOUS | Status: AC
  Filled 2014-08-09: qty 100

## 2014-08-09 MED ORDER — BUPIVACAINE-EPINEPHRINE 0.25% -1:200000 IJ SOLN
INTRAMUSCULAR | Status: DC | PRN
  Administered 2014-08-09: 30 mL

## 2014-08-09 MED ORDER — SODIUM CHLORIDE 0.9 % IV SOLN
INTRAVENOUS | Status: DC | PRN
  Administered 2014-08-09: 60 mL

## 2014-08-09 MED ORDER — ONDANSETRON HCL 4 MG PO TABS: 4.0000 mg | ORAL_TABLET | Freq: Four times a day (QID) | ORAL | Status: DC | PRN

## 2014-08-09 MED ORDER — PROPOFOL INFUSION 10 MG/ML OPTIME
INTRAVENOUS | Status: DC | PRN
  Administered 2014-08-09: 50 ug/kg/min via INTRAVENOUS

## 2014-08-09 MED ORDER — ENOXAPARIN SODIUM 30 MG/0.3ML ~~LOC~~ SOLN
30.0000 mg | Freq: Two times a day (BID) | SUBCUTANEOUS | Status: DC
  Administered 2014-08-10 – 2014-08-12 (×5): 30 mg via SUBCUTANEOUS
  Filled 2014-08-09 (×5): qty 0.3

## 2014-08-09 MED ORDER — GLYCOPYRROLATE 0.2 MG/ML IJ SOLN
INTRAMUSCULAR | Status: DC | PRN
  Administered 2014-08-09: 0.2 mg via INTRAVENOUS

## 2014-08-09 MED ORDER — CALCITRIOL 0.25 MCG PO CAPS
0.5000 ug | ORAL_CAPSULE | Freq: Every day | ORAL | Status: DC
Start: 2014-08-09 — End: 2014-08-10
  Filled 2014-08-09: qty 1

## 2014-08-09 MED ORDER — VITAMIN B-12 1000 MCG PO TABS
1000.0000 ug | ORAL_TABLET | Freq: Every day | ORAL | Status: DC
  Administered 2014-08-09 – 2014-08-12 (×4): 1000 ug via ORAL
  Filled 2014-08-09 (×4): qty 1

## 2014-08-09 MED ORDER — TRANEXAMIC ACID 1000 MG/10ML IV SOLN
1000.0000 mg | Freq: Once | INTRAVENOUS | Status: AC
  Administered 2014-08-09: 1000 mg via INTRAVENOUS
  Filled 2014-08-09: qty 10

## 2014-08-09 MED ORDER — HYDROMORPHONE HCL 1 MG/ML IJ SOLN: 0.2500 mg | INTRAMUSCULAR | Status: DC | PRN

## 2014-08-09 MED ORDER — PHENYLEPHRINE HCL 10 MG/ML IJ SOLN
INTRAMUSCULAR | Status: DC | PRN
  Administered 2014-08-09 (×2): 100 ug via INTRAVENOUS

## 2014-08-09 MED ORDER — ACETAMINOPHEN 325 MG PO TABS: 650.0000 mg | ORAL_TABLET | Freq: Four times a day (QID) | ORAL | Status: DC | PRN

## 2014-08-09 MED ORDER — ACETAMINOPHEN 10 MG/ML IV SOLN
1000.0000 mg | Freq: Four times a day (QID) | INTRAVENOUS | Status: AC
  Administered 2014-08-09 – 2014-08-10 (×4): 1000 mg via INTRAVENOUS
  Filled 2014-08-09 (×4): qty 100

## 2014-08-09 MED ORDER — ACETAMINOPHEN 650 MG RE SUPP: 650.0000 mg | Freq: Four times a day (QID) | RECTAL | Status: DC | PRN

## 2014-08-09 MED ORDER — CEFAZOLIN SODIUM-DEXTROSE 2-3 GM-% IV SOLR
2.0000 g | Freq: Four times a day (QID) | INTRAVENOUS | Status: AC
  Administered 2014-08-09 – 2014-08-10 (×4): 2 g via INTRAVENOUS
  Filled 2014-08-09 (×4): qty 50

## 2014-08-09 MED ORDER — CEFAZOLIN SODIUM-DEXTROSE 2-3 GM-% IV SOLR
INTRAVENOUS | Status: AC
Start: 2014-08-09 — End: 2014-08-09
  Filled 2014-08-09: qty 50

## 2014-08-09 MED ORDER — KETAMINE HCL 50 MG/ML IJ SOLN
INTRAMUSCULAR | Status: DC | PRN
  Administered 2014-08-09: 25 mg via INTRAMUSCULAR
  Administered 2014-08-09: 50 mg via INTRAVENOUS

## 2014-08-09 MED ORDER — NEOMYCIN-POLYMYXIN B GU 40-200000 IR SOLN
Status: DC | PRN
  Administered 2014-08-09: 14 mL

## 2014-08-09 MED ORDER — SODIUM CHLORIDE 0.9 % IJ SOLN
INTRAMUSCULAR | Status: AC
  Filled 2014-08-09: qty 50

## 2014-08-09 MED ORDER — BISACODYL 10 MG RE SUPP
10.0000 mg | Freq: Every day | RECTAL | Status: DC | PRN
  Administered 2014-08-11: 10 mg via RECTAL
  Filled 2014-08-09: qty 1

## 2014-08-09 MED ORDER — FLEET ENEMA 7-19 GM/118ML RE ENEM: 1.0000 | ENEMA | Freq: Once | RECTAL | Status: AC | PRN

## 2014-08-09 MED ORDER — BUPIVACAINE HCL (PF) 0.5 % IJ SOLN
INTRAMUSCULAR | Status: DC | PRN
Start: 2014-08-09 — End: 2014-08-09
  Administered 2014-08-09: 3 mL

## 2014-08-09 MED ORDER — TRANEXAMIC ACID 1000 MG/10ML IV SOLN
1000.0000 mg | INTRAVENOUS | Status: AC
  Administered 2014-08-09: 1000 mg via INTRAVENOUS
  Filled 2014-08-09: qty 10

## 2014-08-09 MED ORDER — ONDANSETRON HCL 4 MG/2ML IJ SOLN: 4.0000 mg | Freq: Four times a day (QID) | INTRAMUSCULAR | Status: DC | PRN

## 2014-08-09 MED ORDER — NEBIVOLOL HCL 5 MG PO TABS
5.0000 mg | ORAL_TABLET | Freq: Every day | ORAL | Status: DC
  Administered 2014-08-10 – 2014-08-12 (×3): 5 mg via ORAL
  Filled 2014-08-09 (×4): qty 1

## 2014-08-09 MED ORDER — AMLODIPINE BESYLATE 5 MG PO TABS
5.0000 mg | ORAL_TABLET | Freq: Every day | ORAL | Status: DC
  Administered 2014-08-10 – 2014-08-12 (×3): 5 mg via ORAL
  Filled 2014-08-09 (×3): qty 1

## 2014-08-09 MED ORDER — OXYCODONE HCL 5 MG PO TABS
5.0000 mg | ORAL_TABLET | ORAL | Status: DC | PRN
  Administered 2014-08-09: 5 mg via ORAL
  Administered 2014-08-09 – 2014-08-12 (×11): 10 mg via ORAL
  Filled 2014-08-09 (×2): qty 2
  Filled 2014-08-09: qty 1
  Filled 2014-08-09 (×9): qty 2

## 2014-08-09 MED ORDER — NEOMYCIN-POLYMYXIN B GU 40-200000 IR SOLN
Status: AC
  Filled 2014-08-09: qty 20

## 2014-08-09 MED ORDER — ALUM & MAG HYDROXIDE-SIMETH 200-200-20 MG/5ML PO SUSP: 30.0000 mL | ORAL | Status: DC | PRN

## 2014-08-09 MED ORDER — MORPHINE SULFATE 2 MG/ML IJ SOLN
2.0000 mg | INTRAMUSCULAR | Status: DC | PRN
  Administered 2014-08-09 – 2014-08-10 (×3): 2 mg via INTRAVENOUS
  Filled 2014-08-09: qty 1
  Filled 2014-08-09: qty 2
  Filled 2014-08-09: qty 1

## 2014-08-09 MED ORDER — ACETAMINOPHEN 10 MG/ML IV SOLN
INTRAVENOUS | Status: DC | PRN
  Administered 2014-08-09: 1000 mg via INTRAVENOUS

## 2014-08-09 MED ORDER — ALPRAZOLAM 0.5 MG PO TABS
0.5000 mg | ORAL_TABLET | Freq: Two times a day (BID) | ORAL | Status: DC | PRN
  Administered 2014-08-10 – 2014-08-12 (×2): 0.5 mg via ORAL
  Filled 2014-08-09 (×2): qty 1

## 2014-08-09 MED ORDER — PHENOL 1.4 % MT LIQD: 1.0000 | OROMUCOSAL | Status: DC | PRN

## 2014-08-09 MED ORDER — FOLIVANE-F 125-1 MG PO CAPS
1.0000 | ORAL_CAPSULE | Freq: Two times a day (BID) | ORAL | Status: DC
  Administered 2014-08-09 – 2014-08-12 (×6): 1 via ORAL
  Filled 2014-08-09: qty 1

## 2014-08-09 MED ORDER — LEVOTHYROXINE SODIUM 75 MCG PO TABS
150.0000 ug | ORAL_TABLET | Freq: Every day | ORAL | Status: DC
  Administered 2014-08-10 – 2014-08-12 (×3): 150 ug via ORAL
  Filled 2014-08-09 (×3): qty 2

## 2014-08-09 MED ORDER — VITAMIN D 1000 UNITS PO TABS
1000.0000 [IU] | ORAL_TABLET | Freq: Every day | ORAL | Status: DC
  Administered 2014-08-09 – 2014-08-12 (×4): 1000 [IU] via ORAL
  Filled 2014-08-09 (×4): qty 1

## 2014-08-09 MED ORDER — IRBESARTAN 150 MG PO TABS
300.0000 mg | ORAL_TABLET | Freq: Every day | ORAL | Status: DC
  Administered 2014-08-10 – 2014-08-12 (×3): 300 mg via ORAL
  Filled 2014-08-09 (×3): qty 2

## 2014-08-09 MED ORDER — PIOGLITAZONE HCL 15 MG PO TABS
30.0000 mg | ORAL_TABLET | Freq: Every day | ORAL | Status: DC
  Administered 2014-08-10 – 2014-08-12 (×3): 30 mg via ORAL
  Filled 2014-08-09 (×4): qty 2

## 2014-08-09 MED ORDER — CALCIUM ACETATE (PHOS BINDER) 667 MG PO CAPS
667.0000 mg | ORAL_CAPSULE | Freq: Two times a day (BID) | ORAL | Status: DC
  Administered 2014-08-09 – 2014-08-12 (×6): 667 mg via ORAL
  Filled 2014-08-09 (×7): qty 1

## 2014-08-09 MED ORDER — METOCLOPRAMIDE HCL 10 MG PO TABS
10.0000 mg | ORAL_TABLET | Freq: Three times a day (TID) | ORAL | Status: AC
  Administered 2014-08-09 – 2014-08-11 (×8): 10 mg via ORAL
  Filled 2014-08-09 (×8): qty 1

## 2014-08-09 MED ORDER — BUPIVACAINE-EPINEPHRINE (PF) 0.25% -1:200000 IJ SOLN
INTRAMUSCULAR | Status: AC
  Filled 2014-08-09: qty 30

## 2014-08-09 MED ORDER — ONDANSETRON HCL 4 MG/2ML IJ SOLN
INTRAMUSCULAR | Status: DC | PRN
  Administered 2014-08-09: 4 mg via INTRAVENOUS

## 2014-08-09 MED ORDER — AMLODIPINE-OLMESARTAN 5-20 MG PO TABS: 1.0000 | ORAL_TABLET | Freq: Every day | ORAL | Status: DC

## 2014-08-09 MED ORDER — MAGNESIUM HYDROXIDE 400 MG/5ML PO SUSP
30.0000 mL | Freq: Every day | ORAL | Status: DC | PRN
  Administered 2014-08-10: 30 mL via ORAL
  Filled 2014-08-09: qty 30

## 2014-08-09 MED ORDER — CEFAZOLIN SODIUM-DEXTROSE 2-3 GM-% IV SOLR
2.0000 g | Freq: Once | INTRAVENOUS | Status: AC
  Administered 2014-08-09: 2 g via INTRAVENOUS

## 2014-08-09 MED ORDER — NEBIVOLOL HCL 5 MG PO TABS
5.0000 mg | ORAL_TABLET | ORAL | Status: AC
  Administered 2014-08-09: 5 mg via ORAL
  Filled 2014-08-09: qty 1

## 2014-08-09 MED ORDER — SODIUM CHLORIDE 0.9 % IV SOLN
INTRAVENOUS | Status: DC
  Administered 2014-08-09: 1000 mL via INTRAVENOUS
  Administered 2014-08-09 (×2): via INTRAVENOUS

## 2014-08-09 MED ORDER — MENTHOL 3 MG MT LOZG: 1.0000 | LOZENGE | OROMUCOSAL | Status: DC | PRN

## 2014-08-09 MED ORDER — TRAMADOL HCL 50 MG PO TABS
50.0000 mg | ORAL_TABLET | ORAL | Status: DC | PRN
  Administered 2014-08-09: 100 mg via ORAL
  Administered 2014-08-09: 50 mg via ORAL
  Administered 2014-08-10 – 2014-08-12 (×5): 100 mg via ORAL
  Filled 2014-08-09 (×5): qty 2
  Filled 2014-08-09: qty 1
  Filled 2014-08-09 (×3): qty 2

## 2014-08-09 MED ORDER — METFORMIN HCL 500 MG PO TABS
1000.0000 mg | ORAL_TABLET | Freq: Two times a day (BID) | ORAL | Status: DC
  Administered 2014-08-09 – 2014-08-12 (×6): 1000 mg via ORAL
  Filled 2014-08-09 (×6): qty 2

## 2014-08-09 MED ORDER — BUPIVACAINE LIPOSOME 1.3 % IJ SUSP
INTRAMUSCULAR | Status: AC
  Filled 2014-08-09: qty 20

## 2014-08-09 MED ORDER — PANTOPRAZOLE SODIUM 40 MG PO TBEC
40.0000 mg | DELAYED_RELEASE_TABLET | Freq: Two times a day (BID) | ORAL | Status: DC
  Administered 2014-08-09 – 2014-08-12 (×4): 40 mg via ORAL
  Filled 2014-08-09 (×4): qty 1

## 2014-08-09 MED ORDER — SODIUM CHLORIDE 0.9 % IV SOLN
INTRAVENOUS | Status: DC
  Administered 2014-08-09 – 2014-08-10 (×2): via INTRAVENOUS

## 2014-08-09 MED ORDER — ONDANSETRON HCL 4 MG/2ML IJ SOLN: 4.0000 mg | Freq: Once | INTRAMUSCULAR | Status: DC | PRN

## 2014-08-09 MED ORDER — GLIPIZIDE 5 MG PO TABS
10.0000 mg | ORAL_TABLET | Freq: Two times a day (BID) | ORAL | Status: DC
  Administered 2014-08-09 – 2014-08-12 (×6): 10 mg via ORAL
  Filled 2014-08-09 (×6): qty 2

## 2014-08-09 MED ORDER — CITALOPRAM HYDROBROMIDE 20 MG PO TABS
20.0000 mg | ORAL_TABLET | Freq: Every day | ORAL | Status: DC
  Administered 2014-08-10 – 2014-08-12 (×3): 20 mg via ORAL
  Filled 2014-08-09 (×3): qty 1

## 2014-08-09 MED ORDER — SENNOSIDES-DOCUSATE SODIUM 8.6-50 MG PO TABS
1.0000 | ORAL_TABLET | Freq: Two times a day (BID) | ORAL | Status: DC
  Administered 2014-08-09 – 2014-08-12 (×6): 1 via ORAL
  Filled 2014-08-09 (×6): qty 1

## 2014-08-09 MED ORDER — MIDAZOLAM HCL 2 MG/2ML IJ SOLN
INTRAMUSCULAR | Status: DC | PRN
Start: 2014-08-09 — End: 2014-08-09
  Administered 2014-08-09: 2 mg via INTRAVENOUS

## 2014-08-09 MED ORDER — FENTANYL CITRATE (PF) 100 MCG/2ML IJ SOLN: 25.0000 ug | INTRAMUSCULAR | Status: DC | PRN

## 2014-08-09 MED ORDER — INSULIN ASPART 100 UNIT/ML ~~LOC~~ SOLN
0.0000 [IU] | Freq: Three times a day (TID) | SUBCUTANEOUS | Status: DC
  Administered 2014-08-09: 3 [IU] via SUBCUTANEOUS
  Administered 2014-08-10: 8 [IU] via SUBCUTANEOUS
  Administered 2014-08-10: 5 [IU] via SUBCUTANEOUS
  Administered 2014-08-10 (×2): 3 [IU] via SUBCUTANEOUS
  Administered 2014-08-11: 2 [IU] via SUBCUTANEOUS
  Administered 2014-08-11 (×2): 5 [IU] via SUBCUTANEOUS
  Administered 2014-08-11 – 2014-08-12 (×2): 3 [IU] via SUBCUTANEOUS
  Filled 2014-08-09 (×3): qty 3
  Filled 2014-08-09: qty 1
  Filled 2014-08-09: qty 2
  Filled 2014-08-09 (×2): qty 5
  Filled 2014-08-09: qty 3
  Filled 2014-08-09: qty 5
  Filled 2014-08-09: qty 3

## 2014-08-09 MED ORDER — SODIUM CHLORIDE 0.9 % IV SOLN
Freq: Once | INTRAVENOUS | Status: AC
  Administered 2014-08-09: 16:00:00 via INTRAVENOUS

## 2014-08-09 MED ORDER — LINAGLIPTIN 5 MG PO TABS
5.0000 mg | ORAL_TABLET | Freq: Every day | ORAL | Status: DC
  Administered 2014-08-10 – 2014-08-12 (×3): 5 mg via ORAL
  Filled 2014-08-09 (×3): qty 1

## 2014-08-09 SURGICAL SUPPLY — 57 items
AUTOTRANSFUS HAS 1/8 (MISCELLANEOUS) ×3
BATTERY INSTRU NAVIGATION (MISCELLANEOUS) ×12 IMPLANT
BLADE SAW 1 (BLADE) ×3 IMPLANT
BLADE SAW 1/2 (BLADE) ×3 IMPLANT
BONE CEMENT GENTAMICIN (Cement) ×3 IMPLANT
CANISTER SUCT 1200ML W/VALVE (MISCELLANEOUS) ×3 IMPLANT
CANISTER SUCT 3000ML (MISCELLANEOUS) ×6 IMPLANT
CAP KNEE TOTAL 3 SIGMA ×3 IMPLANT
CATH TRAY 16F METER LATEX (MISCELLANEOUS) ×3 IMPLANT
CEMENT BONE GENTAMICIN 40 (Cement) ×1 IMPLANT
COOLER POLAR GLACIER W/PUMP (MISCELLANEOUS) ×3 IMPLANT
DRAPE INCISE IOBAN 66X45 STRL (DRAPES) ×3 IMPLANT
DRAPE SHEET LG 3/4 BI-LAMINATE (DRAPES) ×3 IMPLANT
DRSG DERMACEA 8X12 NADH (GAUZE/BANDAGES/DRESSINGS) ×3 IMPLANT
DRSG OPSITE POSTOP 4X14 (GAUZE/BANDAGES/DRESSINGS) ×3 IMPLANT
DURAPREP 26ML APPLICATOR (WOUND CARE) ×6 IMPLANT
ELECT CAUTERY BLADE 6.4 (BLADE) ×3 IMPLANT
EX-PIN ORTHOLOCK NAV 4X150 (PIN) ×6 IMPLANT
GLOVE BIOGEL M STRL SZ7.5 (GLOVE) ×6 IMPLANT
GLOVE INDICATOR 8.0 STRL GRN (GLOVE) ×3 IMPLANT
GLOVE SURG 9.0 ORTHO LTXF (GLOVE) ×3 IMPLANT
GLOVE SURG ORTHO 9.0 STRL STRW (GLOVE) ×3 IMPLANT
GOWN STRL REUS W/ TWL LRG LVL3 (GOWN DISPOSABLE) ×1 IMPLANT
GOWN STRL REUS W/TWL 2XL LVL3 (GOWN DISPOSABLE) ×3 IMPLANT
GOWN STRL REUS W/TWL LRG LVL3 (GOWN DISPOSABLE) ×2
GOWN STRL REUS W/TWL XL LVL4 (GOWN DISPOSABLE) ×3 IMPLANT
HANDPIECE SUCTION TUBG SURGILV (MISCELLANEOUS) ×3 IMPLANT
HOLDER FOLEY CATH W/STRAP (MISCELLANEOUS) ×3 IMPLANT
HOOD PEEL AWAY FACE SHEILD DIS (HOOD) ×6 IMPLANT
KNIFE SCULPS 14X20 (INSTRUMENTS) ×3 IMPLANT
NDL SAFETY 18GX1.5 (NEEDLE) ×3 IMPLANT
NEEDLE SPNL 18GX3.5 QUINCKE PK (NEEDLE) ×3 IMPLANT
NEEDLE SPNL 20GX3.5 QUINCKE YW (NEEDLE) ×3 IMPLANT
NS IRRIG 500ML POUR BTL (IV SOLUTION) ×3 IMPLANT
PACK TOTAL KNEE (MISCELLANEOUS) ×3 IMPLANT
PAD GROUND ADULT SPLIT (MISCELLANEOUS) ×3 IMPLANT
PAD WRAPON POLAR KNEE (MISCELLANEOUS) ×1 IMPLANT
PIN FIXATION 1/8DIA X 3INL (PIN) ×3 IMPLANT
SOL .9 NS 3000ML IRR  AL (IV SOLUTION) ×2
SOL .9 NS 3000ML IRR UROMATIC (IV SOLUTION) ×1 IMPLANT
SOL PREP PVP 2OZ (MISCELLANEOUS) ×3
SOLUTION PREP PVP 2OZ (MISCELLANEOUS) ×1 IMPLANT
SPONGE DRAIN TRACH 4X4 STRL 2S (GAUZE/BANDAGES/DRESSINGS) ×3 IMPLANT
STAPLER SKIN PROX 35W (STAPLE) ×3 IMPLANT
STRAP SAFETY BODY (MISCELLANEOUS) ×3 IMPLANT
SUCTION FRAZIER TIP 10 FR DISP (SUCTIONS) ×3 IMPLANT
SUT VIC AB 0 CT1 36 (SUTURE) ×3 IMPLANT
SUT VIC AB 1 CT1 36 (SUTURE) ×6 IMPLANT
SUT VIC AB 2-0 CT2 27 (SUTURE) ×3 IMPLANT
SYR 20CC LL (SYRINGE) ×3 IMPLANT
SYR 30ML LL (SYRINGE) ×3 IMPLANT
SYR 50ML LL SCALE MARK (SYRINGE) ×3 IMPLANT
SYSTEM AUTOTRANSFUS DUAL TROCR (MISCELLANEOUS) ×1 IMPLANT
TOWEL OR 17X26 4PK STRL BLUE (TOWEL DISPOSABLE) ×3 IMPLANT
TOWER CARTRIDGE SMART MIX (DISPOSABLE) ×3 IMPLANT
WATER STERILE IRR 1000ML POUR (IV SOLUTION) IMPLANT
WRAPON POLAR PAD KNEE (MISCELLANEOUS) ×3

## 2014-08-09 NOTE — Progress Notes (Signed)
ALERT. MODERATE PAIN RELIEF WITH PRESENT REGIME OF TRAMADOL,OXYCODONE AND MORPHINE. FULL SENSATION NOTED. POOR APPETITE . TOLERATED AUTOVAC 92 CC. LEFT KNEE DRESSING CLEAN DRY AND INTACT

## 2014-08-09 NOTE — Brief Op Note (Signed)
08/09/2014  11:11 AM  PATIENT:  Rebecca Kirby  69 y.o. female  PRE-OPERATIVE DIAGNOSIS:  DEGENERATIVE ARTHROSIS LEFT KNEE  POST-OPERATIVE DIAGNOSIS:  DEGENERATIVE ARTHROSIS LEFT KNEE  PROCEDURE:  Procedure(s): TOTAL KNEE ARTHROPLASTY (Left) using computer-assisted navigation  SURGEON:  Surgeon(s) and Role:    * Dereck Leep, MD - Primary  ASSISTANTS: Vance Peper, PA   ANESTHESIA:   spinal  EBL:  Total I/O In: -  Out: 550 [Urine:500; Blood:50]  BLOOD ADMINISTERED:none  DRAINS: 2 drains to reinfusion system   LOCAL MEDICATIONS USED:  MARCAINE    and OTHER Exparel  SPECIMEN:  No Specimen  DISPOSITION OF SPECIMEN:  N/A  COUNTS:  YES  TOURNIQUET:  99 min  DICTATION: .Dragon Dictation  PLAN OF CARE: Admit to inpatient   PATIENT DISPOSITION:  PACU - hemodynamically stable.   Delay start of Pharmacological VTE agent (>24hrs) due to surgical blood loss or risk of bleeding: yes

## 2014-08-09 NOTE — Anesthesia Procedure Notes (Addendum)
Date/Time: 08/09/2014 7:59 AM Performed by: Nelda Marseille Pre-anesthesia Checklist: Patient identified, Emergency Drugs available, Suction available, Patient being monitored and Timeout performed Oxygen Delivery Method: Simple face mask    Spinal Patient location during procedure: OR Start time: 08/09/2014 7:40 AM End time: 08/09/2014 7:44 AM Staffing Resident/CRNA: Nelda Marseille Preanesthetic Checklist Completed: patient identified, site marked, surgical consent, pre-op evaluation, timeout performed, IV checked, risks and benefits discussed and monitors and equipment checked Spinal Block Patient position: sitting Prep: Betadine Patient monitoring: heart rate, continuous pulse ox, blood pressure and cardiac monitor Approach: midline Location: L4-5 Injection technique: single-shot Needle Needle type: Whitacre and Introducer  Needle gauge: 24 G Needle length: 9 cm Assessment Sensory level: T10 Additional Notes Negative paresthesia. Negative blood return. Positive free-flowing CSF. Expiration date of kit checked and confirmed. Patient tolerated procedure well, without complications.

## 2014-08-09 NOTE — H&P (Addendum)
The patient has been re-examined, and the chart reviewed, and there have been no interval changes to the documented history and physical.    The risks, benefits, and alternatives have been discussed at length, and the patient is willing to proceed.    A 3 cm superficial "scratch" was noted superior to the patella. The patient believes her dog may have caused the scratch this AM when she jumped up on the patient. There was not a break in the skin integrity, minimal redness, and no evidence of drainage.This finding was discussed in detail with the patient and her daughter, specifically with regard to the potential for cellulitis and infection given the patient's diabetes. The grave consequences with periprosthetic infection were discussed. The patient and her daughter expressed understanding of these potential risks and agreed with proceeding with surgery.

## 2014-08-09 NOTE — Anesthesia Preprocedure Evaluation (Signed)
Anesthesia Evaluation  Patient identified by MRN, date of birth, ID band Patient awake    Reviewed: Allergy & Precautions, NPO status , Patient's Chart, lab work & pertinent test results  History of Anesthesia Complications Negative for: history of anesthetic complications  Airway Mallampati: II  TM Distance: >3 FB Neck ROM: Full    Dental  (+) Chipped,    Pulmonary neg pulmonary ROS, former smoker,  breath sounds clear to auscultation  Pulmonary exam normal       Cardiovascular Exercise Tolerance: Poor hypertension, Pt. on medications and Pt. on home beta blockers Normal cardiovascular examRhythm:Regular Rate:Normal     Neuro/Psych Anxiety negative neurological ROS     GI/Hepatic Neg liver ROS, GERD-  Medicated and Controlled,  Endo/Other  diabetes, Poorly Controlled, Type 2, Oral Hypoglycemic AgentsHypothyroidism   Renal/GU negative Renal ROS  negative genitourinary   Musculoskeletal  (+) Arthritis -, Osteoarthritis,    Abdominal   Peds negative pediatric ROS (+)  Hematology  (+) anemia ,   Anesthesia Other Findings   Reproductive/Obstetrics negative OB ROS                             Anesthesia Physical Anesthesia Plan  ASA: III  Anesthesia Plan: Spinal   Post-op Pain Management:    Induction:   Airway Management Planned: Nasal Cannula  Additional Equipment:   Intra-op Plan:   Post-operative Plan:   Informed Consent: I have reviewed the patients History and Physical, chart, labs and discussed the procedure including the risks, benefits and alternatives for the proposed anesthesia with the patient or authorized representative who has indicated his/her understanding and acceptance.   Dental advisory given  Plan Discussed with: CRNA and Surgeon  Anesthesia Plan Comments:         Anesthesia Quick Evaluation

## 2014-08-09 NOTE — Op Note (Signed)
DATE OF SURGERY:  08/09/2014  PATIENT NAME:  Rebecca Kirby   DOB: Sep 07, 1945  MRN: [id number]  PRE-OPERATIVE DIAGNOSIS: Degenerative arthrosis of the left knee, primary  POST-OPERATIVE DIAGNOSIS:  Same  PROCEDURE:  Left total knee arthroplasty using computer-assisted navigation  SURGEON:  Marciano Sequin. M.D.  ASSISTANT:  Vance Peper, PA (present and scrubbed throughout the case, critical for assistance with exposure, retraction, instrumentation, and closure)  ANESTHESIA: spinal  ESTIMATED BLOOD LOSS: 50 mL  FLUIDS REPLACED: 1300 mL of crystalloid  TOURNIQUET TIME: 99 minutes  DRAINS: 2 medium drains to a reinfusion system  SOFT TISSUE RELEASES: Anterior cruciate ligament, posterior cruciate ligament, deep medial collateral ligament, patellofemoral ligament   IMPLANTS UTILIZED: DePuy PFC Sigma size 3 posterior stabilized femoral component (cemented), size 3 MBT tibial component (cemented), 35 mm 3 peg oval dome patella (cemented), and a 10 mm stabilized rotating platform polyethylene insert.  INDICATIONS FOR SURGERY: Rebecca Kirby is a 69 y.o. year old female with a long history of progressive knee pain. X-rays demonstrated severe degenerative changes in tricompartmental fashion. He had not seen any significant improvement despite conservative nonsurgical intervention. After discussion of the risks and benefits of surgical intervention, the patient expressed understanding of the risks benefits and agree with plans for total knee arthroplasty.   The risks, benefits, and alternatives were discussed at length including but not limited to the risks of infection, bleeding, nerve injury, stiffness, blood clots, the need for revision surgery, cardiopulmonary complications, among others, and they were willing to proceed.  PROCEDURE IN DETAIL: The patient was brought into the operating room and, after adequate spinal anesthesia was achieved, a tourniquet was placed on the patient's upper  thigh. The patient's knee and leg were cleaned and prepped with alcohol and DuraPrep and draped in the usual sterile fashion. A "timeout" was performed as per usual protocol. The lower extremity was exsanguinated using an Esmarch, and the tourniquet was inflated to 300 mmHg. An anterior longitudinal incision was made followed by a standard mid vastus approach. The deep fibers of the medial collateral ligament were elevated in a subperiosteal fashion off of the medial flare of the tibia so as to maintain a continuous soft tissue sleeve. The patella was subluxed laterally and the patellofemoral ligament was incised. Inspection of the knee demonstrated severe degenerative changes with full-thickness loss of articular cartilage. Osteophytes were debrided using a rongeur. Anterior and posterior cruciate ligaments were excised. Two 4.0 mm Schanz pins were inserted in the femur and into the tibia for attachment of the array of trackers used for computer-assisted navigation. Hip center was identified using a circumduction technique. Distal landmarks were mapped using the computer. The distal femur and proximal tibia were mapped using the computer. The distal femoral cutting guide was positioned using computer-assisted navigation so as to achieve a 5 distal valgus cut. The femur was sized and it was felt that a size 3 femoral component was appropriate. A size 3 femoral cutting guide was positioned and the anterior cut was performed and verified using the computer. This was followed by completion of the posterior and chamfer cuts. Femoral cutting guide for the central box was then positioned in the center box cut was performed.  Attention was then directed to the proximal tibia. Medial and lateral menisci were excised. The extramedullary tibial cutting guide was positioned using computer-assisted navigation so as to achieve a 0 varus-valgus alignment and 0 posterior slope. The cut was performed and verified using the  computer. The  proximal tibia was sized and it was felt that a size 3 tibial tray was appropriate. Tibial and femoral trials were inserted followed by insertion of a 10 mm polyethylene insert. The knee was felt to be tight in extension but well balanced in flexion. Trial components were removed and the distal femoral cutting guide was repositioned so as to resect an additional 2 mm of bone. This was followed by placement of the 3 in 1 cutting guide for re-cutting of the chamfers. Trial components were reinserted with a 10 mm polyethylene trial. This allowed for excellent mediolateral soft tissue balancing both in flexion and in full extension. Finally, the patella was cut and prepared so as to accommodate a 35 mm 3 peg oval dome patella. A patella trial was placed and the knee was placed through a range of motion with excellent patellar tracking appreciated. The femoral trial was removed after debridement of posterior osteophytes. The central post-hole for the tibial component was reamed followed by insertion of a keel punch. Tibial trials were then removed. Cut surfaces of bone were irrigated with copious amounts of normal saline with antibiotic solution using pulsatile lavage and then suctioned dry. Polymethylmethacrylate cement with gentamicin was prepared in the usual fashion using a vacuum mixer. Cement was applied to the cut surface of the proximal tibia as well as along the undersurface of a size 3 MBT tibial component. Tibial component was positioned and impacted into place. Excess cement was removed using [employer] fast streamer. Cement was then applied to the cut surfaces of the femur as well as along the posterior flanges of the size 3 femoral component. The femoral component was positioned and impacted into place. Excess cement was removed using [employer] fast streamer. A 10 mm polyethylene trial was inserted and the knee was brought into full extension with steady axial compression applied. Finally, cement was applied  to the backside of a 35 mm 3 peg oval dome patella and the patellar component was positioned and patellar clamp applied. Excess cement was removed using [employer] fast streamer. After adequate curing of the cement, the tourniquet was deflated after a total tourniquet time of 99 minutes. Hemostasis was achieved using electrocautery. The knee was irrigated with copious amounts of normal saline with antibiotic solution using pulsatile lavage and then suctioned dry. 20 mL of 1.3% Exparel in 40 mL of normal saline was injected along the posterior capsule, medial and lateral gutters, and along the arthrotomy site. A 10 mm stabilized rotating platform polyethylene insert was inserted and the knee was placed through a range of motion with excellent mediolateral soft tissue balancing appreciated and excellent patellar tracking noted. 2 medium drains were placed in the wound bed and brought out through separate stab incisions to be attached to a reinfusion system. The medial parapatellar portion of the incision was reapproximated using interrupted sutures of #1 Vicryl. Subcutaneous tissue was then injected with a total of 30 cc of 0.25% Marcaine with epinephrine. Subcutaneous tissue was approximated in layers using first #0 Vicryl followed #2-0 Vicryl. The skin was approximated with skin staples. A sterile dressing was applied.  The patient tolerated the procedure well and was transported to the recovery room in stable condition.    James P. Holley Bouche., M.D.

## 2014-08-09 NOTE — OR Nursing (Signed)
Patient has a superficial reddened area on the left knee close to where the incision will be. Dr. Marry Guan has had extensive conversation with the patient and her daughter re risk and benefits of surgery today. Patient is adamant about wanting to do surgery today. The decision by Dr. Marry Guan with input from family was to proceed with surgery today.

## 2014-08-09 NOTE — Care Management Note (Addendum)
Case Management Note  Patient Details  Name: Rebecca Kirby MRN: [id number] Date of Birth: 1945-09-08  Subjective/Objective:                  Patient received from PACU. She is resting in bed and able to answer CM assessment questions. She would like to return home at discharge. She used [facility] in the past (2012).  She has a rolling walker available at home. She uses [facility] for Rx  [phone].   Action/Plan:  RNCM will continue to follow.Lovenox 40mg  #14 called in to [facility] for Rx  [phone] for price and availability.  List of home health providers shared with patient/daughter. Checking with [facility] to see if they can accept patient as that is her preference.   Expected Discharge Date:                  Expected Discharge Plan:     In-House Referral:     Discharge planning Services  CM Consult  Post Acute Care Choice:    Choice offered to:  Patient, Adult Children  DME Arranged:  N/A DME Agency:     HH Arranged:  PT HH Agency:  [facility]  Status of Service:     Medicare Important Message Given:    Date Medicare IM Given:    Medicare IM give by:    Date Additional Medicare IM Given:    Additional Medicare Important Message give by:     If discussed at [place] of Stay Meetings, dates discussed:    Additional Comments: [facility] could not provide assistance to this patient. Patient/daughter would like to use [facility]. Referral text to Sonia Side and Tim with [facility].  Lovenox $14.69.   Marshell Garfinkel, RN 08/09/2014, 1:08 PM

## 2014-08-09 NOTE — Transfer of Care (Signed)
Immediate Anesthesia Transfer of Care Note  Patient: Rebecca Kirby  Procedure(s) Performed: Procedure(s): TOTAL KNEE ARTHROPLASTY (Left)  Patient Location: PACU  Anesthesia Type:Spinal  Level of Consciousness: awake and alert   Airway & Oxygen Therapy: Patient connected to face mask oxygen  Post-op Assessment: Report given to RN and Post -op Vital signs reviewed and stable  Post vital signs: Reviewed and stable  Last Vitals:  Filed Vitals:   08/09/14 0615  BP: 130/70  Pulse: 76  Temp: 36.8 C  Resp: 14    Complications: No apparent anesthesia complications

## 2014-08-09 NOTE — Transfer of Care (Signed)
Immediate Anesthesia Transfer of Care Note  Patient: Rebecca Kirby  Procedure(s) Performed: Procedure(s): TOTAL KNEE ARTHROPLASTY (Left)  Patient Location: PACU  Anesthesia Type:Spinal  Level of Consciousness: awake and oriented  Airway & Oxygen Therapy: Patient connected to face mask oxygen  Post-op Assessment: Report given to RN and Post -op Vital signs reviewed and stable  Post vital signs: Reviewed and stable  Last Vitals:  Filed Vitals:   08/09/14 0615  BP: 130/70  Pulse: 76  Temp: 36.8 C  Resp: 14    Complications: No apparent anesthesia complications

## 2014-08-10 LAB — CBC
HCT: 29.3 % — ABNORMAL LOW (ref 35.0–47.0)
Hemoglobin: 9.2 g/dL — ABNORMAL LOW (ref 12.0–16.0)
MCH: 25.8 pg — AB (ref 26.0–34.0)
MCHC: 31.5 g/dL — AB (ref 32.0–36.0)
MCV: 81.9 fL (ref 80.0–100.0)
PLATELETS: 171 10*3/uL (ref 150–440)
RBC: 3.58 MIL/uL — ABNORMAL LOW (ref 3.80–5.20)
RDW: 16.7 % — AB (ref 11.5–14.5)
WBC: 13.4 10*3/uL — ABNORMAL HIGH (ref 3.6–11.0)

## 2014-08-10 LAB — BASIC METABOLIC PANEL
Anion gap: 8 (ref 5–15)
BUN: 8 mg/dL (ref 6–20)
CHLORIDE: 102 mmol/L (ref 101–111)
CO2: 24 mmol/L (ref 22–32)
CREATININE: 0.91 mg/dL (ref 0.44–1.00)
Calcium: 7.4 mg/dL — ABNORMAL LOW (ref 8.9–10.3)
Glucose, Bld: 243 mg/dL — ABNORMAL HIGH (ref 65–99)
Potassium: 4.9 mmol/L (ref 3.5–5.1)
Sodium: 134 mmol/L — ABNORMAL LOW (ref 135–145)

## 2014-08-10 LAB — GLUCOSE, CAPILLARY
GLUCOSE-CAPILLARY: 258 mg/dL — AB (ref 65–99)
GLUCOSE-CAPILLARY: 197 mg/dL — AB (ref 65–99)
Glucose-Capillary: 196 mg/dL — ABNORMAL HIGH (ref 65–99)
Glucose-Capillary: 237 mg/dL — ABNORMAL HIGH (ref 65–99)

## 2014-08-10 NOTE — Progress Notes (Signed)
Still complaining of pain. Minimal relief with medication prescribed. Dsg to left knee dry and intact with polar care in place. Foley patent.

## 2014-08-10 NOTE — Progress Notes (Signed)
Physical Therapy Treatment Patient Details Name: Rebecca Kirby MRN: [id number] DOB: [birth year] Today's Date: 08/10/2014    History of Present Illness This patient is a 69 year old female who came to [facility] for a L TKR.    PT Comments    Pt tolerating treatment session well, motivated and able to complete entire PT sesssion as planned. Pt continues to make progress toward goals as evidenced by improved ambulation distance and strength demonstrated during therex. Pt's greatest limitation continues to be AROM and pain control which continues to limit ability to perform functional mobility at baseline function. Patient presenting with impairment of strength, pain, range of motion, balance, and activity tolerance, limiting ability to perform ADL and mobility tasks at  baseline level of function. Patient will benefit from skilled intervention to address the above impairments and limitations, in order to restore to prior level of function, improve patient safety upon discharge, and to decrease caregiver burden.    Follow Up Recommendations  SNF     Equipment Recommendations  None recommended by PT    Recommendations for Other Services       Precautions / Restrictions Precautions Precautions: Fall Restrictions Weight Bearing Restrictions: Yes LLE Weight Bearing: Weight bearing as tolerated    Mobility  Bed Mobility Overal bed mobility: Needs Assistance Bed Mobility: Sit to Supine     Supine to sit: Supervision     General bed mobility comments: Cues given for leg hooking to come back into supine. Pt unable to effectively scoot up in despite multiple attemtps.   Transfers Overall transfer level: Needs assistance Equipment used: Rolling walker (2 wheeled) Transfers: Sit to/from Stand Sit to Stand: Supervision         General transfer comment: Maximal effort required, but able to accomplish in one attempt.   Ambulation/Gait Ambulation/Gait assistance: Supervision Ambulation  Distance (Feet): 200 Feet Assistive device: Rolling walker (2 wheeled) Gait Pattern/deviations: Step-through pattern (cadence: equal; step-length: near-equal. ) Gait velocity: 0.66 m/s Gait velocity interpretation: <1.8 ft/sec, indicative of risk for recurrent falls General Gait Details: 55' to bathroom, 18' to chair   Stairs            Wheelchair Mobility    Modified Rankin (Stroke Patients Only)       Balance Overall balance assessment: Modified Independent;History of Falls;No apparent balance deficits (not formally assessed)                                  Cognition Arousal/Alertness: Awake/alert Behavior During Therapy: WFL for tasks assessed/performed Overall Cognitive Status: Within Functional Limits for tasks assessed                      Exercises Total Joint Exercises Ankle Circles/Pumps:  (Pt familiar with and already performing. ) Quad Sets: Supine;10 reps;Left;Strengthening;AROM Short Arc Quad: AAROM;Strengthening;Left;Supine;15 reps Heel Slides: AAROM;Left;Strengthening;10 reps;Supine Hip ABduction/ADduction: Strengthening;AROM;Left;Supine;15 reps Straight Leg Raises: AAROM;Strengthening;Left;10 reps;Supine Goniometric ROM: 15-61 degrees L knee flexion     General Comments        Pertinent Vitals/Pain Pain Assessment: 0-10 Pain Score: 8  Pain Location: L knee  Pain Descriptors / Indicators: Aching;Constant Pain Intervention(s): Limited activity within patient's tolerance;Premedicated before session;Monitored during session;Ice applied    Home Living Family/patient expects to be discharged to:: Private residence Living Arrangements: Alone Available Help at Discharge: Family Type of Home: Mobile home Home Access: Stairs to enter Entrance Stairs-Rails: Right Home Layout: Other (  Comment) Home Equipment: Bedside commode;Walker - 2 wheels      Prior Function Level of Independence: Independent      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   PT Goals (current goals can now be found in the care plan section) Acute Rehab PT Goals Patient Stated Goal: Home with familiy assist PT Goal Formulation: With patient/family Time For Goal Achievement: 08/24/14 Potential to Achieve Goals: Fair Progress towards PT goals: Progressing toward goals    Frequency  BID    PT Plan Current plan remains appropriate    Co-evaluation             End of Session Equipment Utilized During Treatment: Gait belt Activity Tolerance: Patient tolerated treatment well;Patient limited by pain Patient left: with family/visitor present;with call bell/phone within reach;in bed;with bed alarm set     Time: 1421-1444 PT Time Calculation (min) (ACUTE ONLY): 23 min  Charges:  $Gait Training: 8-22 mins $Therapeutic Exercise: 8-22 mins                    G Codes:      Taffany Heiser C 08-19-14, 2:55 PM 2:56 PM  Etta Grandchild, PT, DPT [place] License # [id number]

## 2014-08-10 NOTE — Plan of Care (Signed)
Problem: Consults Goal: Diagnosis- Total Joint Replacement Outcome: Completed/Met Date Met:  08/10/14 Primary Total Knee     

## 2014-08-10 NOTE — Anesthesia Postprocedure Evaluation (Signed)
  Anesthesia Post-op Note  Patient: Rebecca Kirby  Procedure(s) Performed: Procedure(s): TOTAL KNEE ARTHROPLASTY (Left)  Anesthesia type:Spinal  Patient location: PACU  Post pain: Pain level controlled  Post assessment: Post-op Vital signs reviewed, Patient's Cardiovascular Status Stable, Respiratory Function Stable, Patent Airway and No signs of Nausea or vomiting  Post vital signs: Reviewed and stable  Last Vitals:  Filed Vitals:   08/10/14 0412  BP: 149/56  Pulse: 91  Temp: 37.1 C  Resp: 18    Level of consciousness: awake, alert  and patient cooperative  Complications: No apparent anesthesia complications

## 2014-08-10 NOTE — Progress Notes (Signed)
Pt complaining of being in pain. Pt dangled legs, sat on side of bed for a few minutes. She stated it made her feel better

## 2014-08-10 NOTE — Progress Notes (Signed)
SNF and Non-Emergent EMS Transport Benefits:  Number called: [phone] Rep: Nevin Bloodgood Reference Number: [id number]  [place] Complete HMO Plan Two active as of 02/26/14 with no deductible.  Out of pocket max is $6700, of which $384.16 met so far.  In-network SNF: $0 copay for days 1-20, a $160 daily copay for days 21-62, and a $0 copay for days 63-100.  Once out of pocket is reached, patient covered at 100% for remainder of 100 day benefit period.  $0 copay for professional fees and 3 day hospital stay is not required.  Josem Kaufmann is required: [phone].    Non-emergent EMS transport: $250 copay for each one way medically necessary, Medicare covered trip.  Josem Kaufmann is required: [phone].

## 2014-08-10 NOTE — Anesthesia Post-op Follow-up Note (Signed)
  Anesthesia Pain Follow-up Note  Patient: Rebecca Kirby  Day #: 1  Date of Follow-up: 08/10/2014 Time: 7:27 AM  Last Vitals:  Filed Vitals:   08/10/14 0412  BP: 149/56  Pulse: 91  Temp: 37.1 C  Resp: 18    Level of Consciousness: alert  Pain: none   Side Effects:None  Catheter Site Exam:clean, dry, no drainage  Plan: D/C from anesthesia care  Gerald Leitz

## 2014-08-10 NOTE — Anesthesia Postprocedure Evaluation (Deleted)
  Anesthesia Post-op Note  Patient: Rebecca Kirby  Procedure(s) Performed: Procedure(s): TOTAL KNEE ARTHROPLASTY (Left)  Anesthesia type:Spinal  Patient location: PACU  Post pain: Pain level controlled  Post assessment: Post-op Vital signs reviewed, Patient's Cardiovascular Status Stable, Respiratory Function Stable, Patent Airway and No signs of Nausea or vomiting  Post vital signs: Reviewed and stable  Last Vitals:  Filed Vitals:   08/10/14 0733  BP: 148/58  Pulse: 70  Temp: 36.7 C  Resp: 18    Level of consciousness: awake, alert  and patient cooperative  Complications: No apparent anesthesia complications

## 2014-08-10 NOTE — Progress Notes (Signed)
   Subjective: 1 Day Post-Op Procedure(s) (LRB): TOTAL KNEE ARTHROPLASTY (Left) Patient reports pain as 7 on 0-10 scale.   Patient is well, and has had no acute complaints or problems We will start therapy today.  Plan is to go Rehab after hospital stay. no nausea and no vomiting Patient denies any chest pains or shortness of breath. Objective: Vital signs in last 24 hours: Temp:  [97.7 F (36.5 C)-99.7 F (37.6 C)] 98 F (36.7 C) (06/14 0733) Pulse Rate:  [57-91] 70 (06/14 0733) Resp:  [12-18] 18 (06/14 0733) BP: (108-149)/(43-65) 148/58 mmHg (06/14 0733) SpO2:  [87 %-100 %] 90 % (06/14 0733) Weight:  [101.152 kg (223 lb)] 101.152 kg (223 lb) (06/13 1400) Patient still has original dressing in place Calves are nontender Homan sign negative Heels are non tender and elevated off the bed using rolled towels Intake/Output from previous day: 06/13 0701 - 06/14 0700 In: 3242 [I.V.:3100; Blood:92] Out: 3125 [Urine:3000; Drains:75; Blood:50] Intake/Output this shift:     Recent Labs  08/10/14 0441  HGB 9.2*    Recent Labs  08/10/14 0441  WBC 13.4*  RBC 3.58*  HCT 29.3*  PLT 171    Recent Labs  08/10/14 0441  NA 134*  K 4.9  CL 102  CO2 24  BUN 8  CREATININE 0.91  GLUCOSE 243*  CALCIUM 7.4*   No results for input(s): LABPT, INR in the last 72 hours.  EXAM General - Patient is Alert, Appropriate and Oriented Extremity - Neurologically intact Neurovascular intact Sensation intact distally Intact pulses distally Dorsiflexion/Plantar flexion intact Dressing - Patient still has original dressing in place Motor Function - intact, moving foot and toes well on exam. Partial straight leg raise on her own  Past Medical History  Diagnosis Date  . Anxiety   . Hypertension   . Hypothyroidism   . Diabetes mellitus without complication   . GERD (gastroesophageal reflux disease)   . Anemia     Assessment/Plan: 1 Day Post-Op Procedure(s) (LRB): TOTAL KNEE  ARTHROPLASTY (Left) Active Problems:   Left knee DJD   Total knee replacement status  Estimated body mass index is 36.01 kg/(m^2) as calculated from the following:   Height as of this encounter: 5\' 6"  (1.676 m).   Weight as of this encounter: 101.152 kg (223 lb). Advance diet Up with therapy D/C IV fluids Discharge to SNF  Labs: Were reviewed DVT Prophylaxis - Lovenox, Foot Pumps and TED hose Weight-Bearing as tolerated to left leg D/C O2 and Pulse OX and try on Room Air  Jon R. [place] 08/10/2014, 7:42 AM

## 2014-08-10 NOTE — Clinical Social Work Note (Signed)
Clinical Social Work Assessment  Patient Details  Name: Rebecca Kirby MRN: [id number] Date of Birth: 1945-09-04  Date of referral:  08/10/14               Reason for consult:  Facility Placement                Permission sought to share information with:  Family Supports Permission granted to share information::  Yes, Verbal Permission Granted  Name::        Agency::     Relationship::     Contact Information:     Housing/Transportation Living arrangements for the past 2 months:   (home) Source of Information:  Patient, Adult Children Patient Interpreter Needed:  None Criminal Activity/Legal Involvement Pertinent to Current Situation/Hospitalization:  No - Comment as needed Significant Relationships:  Adult Children Lives with:  Self Do you feel safe going back to the place where you live?  Yes Need for family participation in patient care:  Yes (Comment)  Care giving concerns:  Patient's daughter reports patient will have 24/7 care at home.    Social Worker assessment / plan:  CSW spoke with patient and her daughter this afternoon in patient's room. Patient had a scowl on her face. Patient's daughter and patient immediately began to discuss with CSW their concern regarding hospital bills that were sent to a collection agency and that they have not yet had a resolution. They also stated patient was asked by Dr. Jeb Levering to be a part of a program at the [facility] but patient did not want to incur any additional bills. CSW offered sympathy and offered to discuss situation with Elease Etienne, the [place] Patient Navigator. CSW also advised to call and speak with a supervisor in the financial department as well.   When CSW began speaking regarding the reason visiting patient. Patient looked out the window and was told by her daughter to pay attention and patient replied that "I am paying attention." Patient bluntly stated, "no" when asked if she would consider rehab. Patient's  daughter stated that she told the PT that patient would refuse and that this was a similar situation that patient had with her last surgery where it was recommended she go to STR but was able to go home with [facility] and had 24/7 care. Patient's daughter stated that patient would have 24/7 care. 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 Employment status:  Retired Forensic scientist:    PT Recommendations:  [place] / Referral to community resources:     Patient/Family's Response to care:  Patient very hostile regarding recommendation for rehab by PT.  Patient/Family's Understanding of and Emotional Response to Diagnosis, Current Treatment, and Prognosis:  Patient refuses STR at this time.  Emotional Assessment Appearance:  Appears stated age Attitude/Demeanor/Rapport:  Angry, Avoidant, Guarded Affect (typically observed):  Angry Orientation:  Oriented to Self, Oriented to Place, Oriented to  Time, Oriented to Situation Alcohol / Substance use:  Not Applicable Psych involvement (Current and /or in the community):  No (Comment)  Discharge Needs  Concerns to be addressed:  Patient refuses services Readmission within the last 30 days:  No Current discharge risk:   (PT recommendations for STR level of  care, paitient refuses) Barriers to Discharge:  No Barriers Identified   Shela Leff, LCSW 08/10/2014, 11:51 AM

## 2014-08-10 NOTE — Procedures (Signed)
lovenox teaching done with teach back. Had lovenox with previous knee surgery. Reinforce teaching prior to discharge

## 2014-08-10 NOTE — Evaluation (Signed)
Physical Therapy Evaluation Patient Details Name: Rebecca Kirby MRN: [id number] DOB: 1945-04-11 Today's Date: 08/10/2014   History of Present Illness  Pt is a 69yo white female who presents 1day s/p L TKA. Pt reports gradual decline in function and increasing pain on LLE. Pt reports history of R TKA in 2012 which reportedly went very well.   Clinical Impression  Pt tolerating evaluation with moderate c/o pain and nausea but able to complete entire PT sesssion as planned. Pt's greatest limitation is pain and limited ROM which continues to limit ability to perform all mobility at baseline function. Patient presenting with impairment of strength, pain, range of motion, balance, and activity tolerance, limiting ability to perform ADL and mobility tasks at  baseline level of function. Patient will benefit from skilled intervention to address the above impairments and limitations, in order to restore to prior level of function, improve patient safety upon discharge, and to decrease caregiver burden.      Follow Up Recommendations SNF    Equipment Recommendations  None recommended by PT    Recommendations for Other Services       Precautions / Restrictions Precautions Precautions: Fall Restrictions Weight Bearing Restrictions: Yes LLE Weight Bearing: Weight bearing as tolerated      Mobility  Bed Mobility Overal bed mobility: Needs Assistance Bed Mobility: Supine to Sit     Supine to sit: Supervision     General bed mobility comments: difficulty moving hips laterally in bed.   Transfers Overall transfer level: Needs assistance Equipment used: Rolling walker (2 wheeled) Transfers: Sit to/from Stand Sit to Stand: Supervision         General transfer comment: Weakness noted with Maximal effort required come to standing.   Ambulation/Gait Ambulation/Gait assistance: Supervision Ambulation Distance (Feet): 30 Feet Assistive device: Rolling walker (2 wheeled) Gait  Pattern/deviations: Antalgic;Decreased weight shift to left   Gait velocity interpretation: <1.8 ft/sec, indicative of risk for recurrent falls General Gait Details: 48' to bathroom, 18' to chair  Stairs            Wheelchair Mobility    Modified Rankin (Stroke Patients Only)       Balance Overall balance assessment: Modified Independent;History of Falls;No apparent balance deficits (not formally assessed)                                           Pertinent Vitals/Pain Pain Assessment: 0-10 Pain Score: 8  Pain Location: L knee  Pain Descriptors / Indicators: Aching;Constant Pain Intervention(s): Monitored during session;Limited activity within patient's tolerance;Ice applied;Premedicated before session    Home Living Family/patient expects to be discharged to:: Private residence Living Arrangements: Alone Available Help at Discharge: Family Type of Home: Mobile home Home Access: Stairs to enter Entrance Stairs-Rails: Right Entrance Stairs-Number of Steps: [place]: One level Home Equipment: Bedside commode;Walker - 2 wheels      Prior Function Level of Independence: Independent         Comments: Still working. History of multiple falls before surgery.      Hand Dominance   Dominant Hand: Right    Extremity/Trunk Assessment   Upper Extremity Assessment: Overall WFL for tasks assessed           Lower Extremity Assessment: Generalized weakness      Cervical / Trunk Assessment: Normal  Communication   Communication: No difficulties  Cognition Arousal/Alertness: Awake/alert Behavior  During Therapy: WFL for tasks assessed/performed Overall Cognitive Status: Within Functional Limits for tasks assessed                      General Comments      Exercises Total Joint Exercises Ankle Circles/Pumps:  (Pt familiar with and already performing. ) Quad Sets: AAROM;Supine;10 reps;Left;Strengthening Short Arc Quad:  AAROM;Strengthening;Left;10 reps;Supine (Very limited AROM and PROM) Heel Slides: AAROM;Left;Strengthening;10 reps;Supine Hip ABduction/ADduction: Strengthening;AROM;Left;10 reps;Supine Straight Leg Raises: AAROM;Strengthening;Left;10 reps;Supine Goniometric ROM: 15-61 degrees L knee flexion       Assessment/Plan    PT Assessment    PT Diagnosis Difficulty walking;Abnormality of gait;Generalized weakness;Acute pain   PT Problem List    PT Treatment Interventions     PT Goals (Current goals can be found in the Care Plan section) Acute Rehab PT Goals Patient Stated Goal: DC to home with help from family PT Goal Formulation: With patient/family Time For Goal Achievement: 08/24/14 Potential to Achieve Goals: Fair    Frequency     Barriers to discharge        Co-evaluation               End of Session Equipment Utilized During Treatment: Gait belt Activity Tolerance: Patient tolerated treatment well;Patient limited by pain Patient left: in chair;with family/visitor present;with call bell/phone within reach;with chair alarm set Nurse Communication: Mobility status         Time: 1031-5945 PT Time Calculation (min) (ACUTE ONLY): 34 min   Charges:   PT Evaluation $Initial PT Evaluation Tier I: 1 Procedure PT Treatments $Therapeutic Exercise: 8-22 mins   PT G Codes:        Buccola,Allan C 08/26/2014, 11:08 AM  11:11 AM  Etta Grandchild, PT, DPT [place] License # [id number]

## 2014-08-10 NOTE — Progress Notes (Signed)
Up to bathroom with assist. Continued  Incisional pain requiring tramadol and oxycodone. Left knee dressing intact. Voiding withyout difficulty. Blood sugars elevated. Neuro checks wnl

## 2014-08-10 NOTE — Evaluation (Signed)
Occupational Therapy Evaluation Patient Details Name: Rebecca Kirby MRN: [id number] DOB: 12/17/1945 Today's Date: 08/10/2014    History of Present Illness This patient is a 69 year old female who came to [facility] for a L TKR.   Clinical Impression   This patient is a 69 year old female who came to [facility] for a L total knee replacement.  Patient lives in a one story mobile home with 5 and 2 steps to enter depending on entrance used.  She had been independent with ADL and functional mobility and works. She now requires minimal assistance for lower body dressing and practiced Donned/doffed socks and pants to knees (drain still in place).      Follow Up Recommendations       Equipment Recommendations       Recommendations for Other Services       Precautions / Restrictions Precautions Precautions: Fall Restrictions Weight Bearing Restrictions: Yes LLE Weight Bearing: Weight bearing as tolerated      Mobility Bed Mobility Overal bed mobility:  Bed Mobility: Supine to Sit     Supine to sit:     General bed mobility comments:   Transfers Overall transfer level: Needs assistance Equipment used: Rolling walker (2 wheeled) Transfers: Sit to/from Stand Sit to Stand: Supervision         General transfer comment:    Balance Overall balance assessment:                                           ADL                                         General ADL Comments: Had been independent now needs assist     Vision     Perception     Praxis      Pertinent Vitals/Pain Pain Assessment: 0-10 Pain Score: 8  Pain Location: L knee  Pain Descriptors / Indicators: Aching;Constant Pain Intervention(s):     Hand Dominance Right   Extremity/Trunk Assessment Upper Extremity Assessment Upper Extremity Assessment: Overall WFL for tasks assessed   Lower Extremity Assessment Lower Extremity Assessment:  Generalized weakness   Cervical / Trunk Assessment Cervical / Trunk Assessment: Normal   Communication Communication Communication: No difficulties   Cognition Arousal/Alertness: Awake/alert Behavior During Therapy: WFL for tasks assessed/performed Overall Cognitive Status: Within Functional Limits for tasks assessed                     General Comments       Exercises       Shoulder Instructions      Home Living Family/patient expects to be discharged to:: Private residence Living Arrangements: Alone Available Help at Discharge: Family Type of Home: Mobile home Home Access: Stairs to enter Entrance Stairs-Number of Steps: 5 in front and 2 in rear Entrance Stairs-Rails: Right Home Layout: Other (Comment)               Home Equipment: Bedside commode;Walker - 2 wheels          Prior Functioning/Environment Level of Independence: Independent        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    OT Diagnosis: Generalized weakness   OT Problem List: Pain  OT Treatment/Interventions:      OT Goals(Current goals can be found in the care plan section) Acute Rehab OT Goals Patient Stated Goal: Home with familiy assist OT Goal Formulation: With patient/family Potential to Achieve Goals: Good  OT Frequency:     Barriers to D/C:            Co-evaluation              End of Session Equipment Utilized During Treatment:  (Hip kit)  Activity Tolerance: Patient tolerated treatment well Patient left: in chair;with chair alarm set;with call bell/phone within reach   Time: 1116-1140 OT Time Calculation (min): 24 min Charges:  OT General Charges $OT Visit: 1 Procedure OT Evaluation $Initial OT Evaluation Tier I: 1 Procedure OT Treatments $Self Care/Home Management : 8-22 mins G-Codes:    Myrene Galas, MS/OTR/L  08/10/2014, 11:51 AM

## 2014-08-11 LAB — CBC
HEMATOCRIT: 28.8 % — AB (ref 35.0–47.0)
Hemoglobin: 9.3 g/dL — ABNORMAL LOW (ref 12.0–16.0)
MCH: 26.1 pg (ref 26.0–34.0)
MCHC: 32.2 g/dL (ref 32.0–36.0)
MCV: 81.1 fL (ref 80.0–100.0)
Platelets: 172 10*3/uL (ref 150–440)
RBC: 3.55 MIL/uL — ABNORMAL LOW (ref 3.80–5.20)
RDW: 16.4 % — AB (ref 11.5–14.5)
WBC: 12.8 10*3/uL — ABNORMAL HIGH (ref 3.6–11.0)

## 2014-08-11 LAB — GLUCOSE, CAPILLARY
GLUCOSE-CAPILLARY: 214 mg/dL — AB (ref 65–99)
GLUCOSE-CAPILLARY: 190 mg/dL — AB (ref 65–99)
Glucose-Capillary: 207 mg/dL — ABNORMAL HIGH (ref 65–99)
Glucose-Capillary: 133 mg/dL — ABNORMAL HIGH (ref 65–99)

## 2014-08-11 MED ORDER — LACTULOSE 10 GM/15ML PO SOLN: 10.0000 g | Freq: Two times a day (BID) | ORAL | Status: DC | PRN

## 2014-08-11 MED ORDER — ENOXAPARIN SODIUM 40 MG/0.4ML ~~LOC~~ SOLN: 40.0000 mg | SUBCUTANEOUS | Status: DC

## 2014-08-11 MED ORDER — OXYCODONE HCL 5 MG PO TABS: 5.0000 mg | ORAL_TABLET | ORAL | Status: DC | PRN

## 2014-08-11 MED ORDER — TRAMADOL HCL 50 MG PO TABS
50.0000 mg | ORAL_TABLET | ORAL | Status: DC | PRN
Start: 2014-08-11 — End: 2015-07-07

## 2014-08-11 NOTE — Progress Notes (Signed)
Inpatient Diabetes Program Recommendations  AACE/ADA: New Consensus Statement on Inpatient Glycemic Control (2013)  Target Ranges:  Prepandial:   less than 140 mg/dL      Peak postprandial:   less than 180 mg/dL (1-2 hours)      Critically ill patients:  140 - 180 mg/dL   Note CBG's higher than goal.  Patient needs to follow-up with PCP regarding elevated CBG's.  A1C not available. May need low dose basal insulin such as Lantus 15 units daily to assist with insulin needs.  Thanks, Adah Perl, RN, BC-ADM Inpatient Diabetes Coordinator Pager [phone] (8a-5p)

## 2014-08-11 NOTE — Discharge Instructions (Signed)
° °Rebecca Kirby ° °Knee Rehabilitation, Guidelines Following Surgery  °Results after knee surgery are often greatly improved when you follow the exercise, range of motion and muscle strengthening exercises prescribed by your doctor. Safety measures are also important to protect the knee from further injury. Any time any of these exercises cause you to have increased pain or swelling in your knee joint, decrease the amount until you are comfortable again and slowly increase them. If you have problems or questions, call your caregiver or physical therapist for advice.  ° °HOME CARE INSTRUCTIONS  °Remove items at home which could result in a fall. This includes throw rugs or furniture in walking pathways.  °· Continue to use polar care unit on the knee for pain and swelling from surgery. You may notice swelling that will progress down to the foot and ankle.  This is normal after surgery.  Elevate the leg when you are not up walking on it.   °· Continue to use the breathing machine which will help keep your temperature down.  It is common for your temperature to cycle up and down following surgery, especially at night when you are not up moving around and exerting yourself.  The breathing machine keeps your lungs expanded and your temperature down. °· Do not place pillow under knee, focus on keeping the knee STRAIGHT while resting ° °DIET °You may resume your previous home diet once your are discharged from the hospital. ° °DRESSING / WOUND CARE / SHOWERING °You may start showering once staples have been removed. Change the surgical dressing as needed. ° °ACTIVITY °Walk with your walker as instructed. °Use walker as long as suggested by your caregivers. °Avoid periods of inactivity such as sitting longer than an hour when not asleep. This helps prevent blood clots.  °You may resume a sexual relationship in one month or when given the OK by your doctor.  °You may return to work once  you are cleared by your doctor.  °Do not drive a car for 6 weeks or until released by you surgeon.  °Do not drive while taking narcotics. ° °WEIGHT BEARING °Weight bearing as tolerated with assist device (walker, cane, etc) as directed, use it as long as suggested by your surgeon or therapist, typically at least 4-6 weeks. ° °POSTOPERATIVE CONSTIPATION PROTOCOL °Constipation - defined medically as fewer than three stools per week and severe constipation as less than one stool per week. ° °One of the most common issues patients have following surgery is constipation.  Even if you have a regular bowel pattern at home, your normal regimen is likely to be disrupted due to multiple reasons following surgery.  Combination of anesthesia, postoperative narcotics, change in appetite and fluid intake all can affect your bowels.  In order to avoid complications following surgery, here are some recommendations in order to help you during your recovery period. ° °Colace (docusate) - Pick up an over-the-counter form of Colace or another stool softener and take twice a day as long as you are requiring postoperative pain medications.  Take with a full glass of water daily.  If you experience loose stools or diarrhea, hold the colace until you stool forms back up.  If your symptoms do not get better within 1 week or if they get worse, check with your doctor. ° °Dulcolax (bisacodyl) - Pick up over-the-counter and take as directed by the product packaging as needed to assist with the movement of your bowels.  Take with a   full glass of water.  Use this product as needed if not relieved by Colace only.   MiraLax (polyethylene glycol) - Pick up over-the-counter to have on hand.  MiraLax is a solution that will increase the amount of water in your bowels to assist with bowel movements.  Take as directed and can mix with a glass of water, juice, soda, coffee, or tea.  Take if you go more than two days without a movement. Do not use  MiraLax more than once per day. Call your doctor if you are still constipated or irregular after using this medication for 7 days in a row.  If you continue to have problems with postoperative constipation, please contact the office for further assistance and recommendations.  If you experience "the worst abdominal pain ever" or develop nausea or vomiting, please contact the office immediatly for further recommendations for treatment.  ITCHING  If you experience itching with your medications, try taking only a single pain pill, or even half a pain pill at a time.  You can also use Benadryl over the counter for itching or also to help with sleep.   TED HOSE STOCKINGS Wear the elastic stockings on both legs for  6 weeks following surgery during the day but you may remove then at night for sleeping.  MEDICATIONS See your medication summary on the After Visit Summary that the nursing staff will review with you prior to discharge.  You may have some home medications which will be placed on hold until you complete the course of blood thinner medication.  It is important for you to complete the blood thinner medication as prescribed by your surgeon.  Continue your approved medications as instructed at time of discharge.  PRECAUTIONS If you experience chest pain or shortness of breath - call 911 immediately for transfer to the hospital emergency department.  If you develop a fever greater that 101 F, purulent drainage from wound, increased redness or drainage from wound, foul odor from the wound/dressing, or calf pain - CONTACT YOUR SURGEON.                                                   FOLLOW-UP APPOINTMENTS Make sure you keep all of your appointments after your operation with your surgeon and caregivers. You should call the office at the above phone number and make an appointment for approximately two weeks after the date of your surgery or on the date instructed by your surgeon outlined in the  "After Visit Summary".   RANGE OF MOTION AND STRENGTHENING EXERCISES  Rehabilitation of the knee is important following a knee injury or an operation. After just a few days of immobilization, the muscles of the thigh which control the knee become weakened and shrink (atrophy). Knee exercises are designed to build up the tone and strength of the thigh muscles and to improve knee motion. Often times heat used for twenty to thirty minutes before working out will loosen up your tissues and help with improving the range of motion but do not use heat for the first two weeks following surgery. These exercises can be done on a training (exercise) mat, on the floor, on a table or on a bed. Use what ever works the best and is most comfortable for you Knee exercises include:  Leg Lifts - While your knee is  still immobilized in a splint or cast, you can do straight leg raises. Lift the leg to 60 degrees, hold for 3 sec, and slowly lower the leg. Repeat 10-20 times 2-3 times daily. Perform this exercise against resistance later as your knee gets better.  Quad and Hamstring Sets - Tighten up the muscle on the front of the thigh (Quad) and hold for 5-10 sec. Repeat this 10-20 times hourly. Hamstring sets are done by pushing the foot backward against an object and holding for 5-10 sec. Repeat as with quad sets.   Leg Slides: Lying on your back, slowly slide your foot toward your buttocks, bending your knee up off the floor (only go as far as is comfortable). Then slowly slide your foot back down until your leg is flat on the floor again.  Angel Wings: Lying on your back spread your legs to the side as far apart as you can without causing discomfort.  A rehabilitation program following serious knee injuries can speed recovery and prevent re-injury in the future due to weakened muscles. Contact your doctor or a physical therapist for more information on knee rehabilitation.   IF YOU ARE TRANSFERRED TO A SKILLED REHAB  FACILITY If the patient is transferred to a skilled rehab facility following release from the hospital, a list of the current medications will be sent to the facility for the patient to continue.  When discharged from the skilled rehab facility, please have the facility set up the patient's [place] prior to being released. Also, the skilled facility will be responsible for providing the patient with their medications at time of release from the facility to include their pain medication, the muscle relaxants, and their blood thinner medication. If the patient is still at the rehab facility at time of the two week follow up appointment, the skilled rehab facility will also need to assist the patient in arranging follow up appointment in our office and any transportation needs.  MAKE SURE YOU:  Understand these instructions.  Get help right away if you are not doing well or get worse.

## 2014-08-11 NOTE — Progress Notes (Signed)
Physical Therapy Treatment Patient Details Name: Rebecca Kirby MRN: [id number] DOB: [birth year] Today's Date: 08/11/2014    History of Present Illness This patient is a 69 year old female who came to [facility] for a L TKR.    PT Comments    Pt tolerating treatment session well, motivated and able to complete entire PT sesssion as planned. Pt continues to make progress toward goals as evidenced by improved safety and tolerance to stairs activities. Pt's greatest limitation continues to be weakness exhibited during therex and HEP review which continues to limit ability to perform HEP indep at DC. Patient presenting with impairment of strength, pain, range of motion, balance, and activity tolerance, limiting ability to perform ADL and mobility tasks at  baseline level of function. Patient will benefit from skilled intervention to address the above impairments and limitations, in order to restore to prior level of function, improve patient safety upon discharge, and to decrease caregiver burden.    Follow Up Recommendations  Home health PT     Equipment Recommendations  None recommended by PT    Recommendations for Other Services       Precautions / Restrictions Precautions Precautions: Fall Restrictions Weight Bearing Restrictions: Yes LLE Weight Bearing: Weight bearing as tolerated    Mobility  Bed Mobility Overal bed mobility: Needs Assistance Bed Mobility: Supine to Sit     Supine to sit: Supervision     General bed mobility comments: Pt still using bed controls to bring back of bed up into full sitting position, despite PT cues to attempt bed mobility from flat to simulate bedmobility at home. Pt reports that her bed is different she will not have problems at home.   Transfers Overall transfer level: Needs assistance Equipment used: Rolling walker (2 wheeled) Transfers: Sit to/from Stand Sit to Stand: Supervision         General transfer comment: Moderate effort  required, but able to accomplish in one attempt.   Ambulation/Gait Ambulation/Gait assistance: Supervision Ambulation Distance (Feet): 160 Feet Assistive device: Rolling walker (2 wheeled)   Gait velocity: 0.50 m/s Gait velocity interpretation: <1.8 ft/sec, indicative of risk for recurrent falls General Gait Details: Gait to PT gym to perform stairs training.    Stairs Stairs: Yes Stairs assistance: Min guard Stair Management: Two rails;Step to pattern Number of Stairs: 8 General stair comments: Pt reminded of cues for safe performance of stairs. Grandson attending to take note. Pt able to perform correctly with minimal verval cues.   Wheelchair Mobility    Modified Rankin (Stroke Patients Only)       Balance Overall balance assessment: Modified Independent;History of Falls;No apparent balance deficits (not formally assessed)                                  Cognition Arousal/Alertness: Awake/alert Behavior During Therapy: WFL for tasks assessed/performed Overall Cognitive Status: Within Functional Limits for tasks assessed                      Exercises Total Joint Exercises Quad Sets: Supine;10 reps;Left;Strengthening;AROM Short Arc Quad: AAROM;Strengthening;Left;Supine;15 reps Heel Slides: AAROM;Left;Strengthening;10 reps;Supine (Reviewed active assisted with patient and taught grandson how to provide assistance with activty s/p DC. ) Straight Leg Raises: Strengthening;Left;10 reps;Supine;AROM Goniometric ROM: 7-80 degrees    General Comments        Pertinent Vitals/Pain Pain Assessment: 0-10 Pain Score: 5  Pain Location: L knee  Pain Descriptors / Indicators: Constant Pain Intervention(s): Monitored during session;Ice applied;Premedicated before session    Home Living                      Prior Function            PT Goals (current goals can now be found in the care plan section) Acute Rehab PT Goals Patient Stated  Goal: Home with familiy assist PT Goal Formulation: With patient/family Time For Goal Achievement: 08/24/14 Potential to Achieve Goals: Good Progress towards PT goals: Progressing toward goals    Frequency  BID    PT Plan Current plan remains appropriate    Co-evaluation             End of Session Equipment Utilized During Treatment: Gait belt Activity Tolerance: Patient tolerated treatment well;Patient limited by pain Patient left: with family/visitor present;with call bell/phone within reach;with chair alarm set;in chair     Time: 1352-1418 PT Time Calculation (min) (ACUTE ONLY): 26 min  Charges:  $Gait Training: 8-22 mins $Therapeutic Exercise: 8-22 mins                    G Codes:      Chason Mciver C Aug 29, 2014, 2:28 PM  2:30 PM  Etta Grandchild, PT, DPT [place] License # [id number]

## 2014-08-11 NOTE — Progress Notes (Signed)
   Subjective: 2 Days Post-Op Procedure(s) (LRB): TOTAL KNEE ARTHROPLASTY (Left) Patient reports pain as 10 on 0-10 scale.  However the patient does not appear to be having this much discomfort. Patient is well, and has had no acute complaints or problems Continue with physical therapy today.  Plan is to go Home after hospital stay. no nausea and no vomiting Patient denies any chest pains or shortness of breath. Objective: Vital signs in last 24 hours: Temp:  [98 F (36.7 C)-98.8 F (37.1 C)] 98.1 F (36.7 C) (06/15 0404) Pulse Rate:  [70-87] 87 (06/15 0404) Resp:  [18] 18 (06/15 0404) BP: (133-149)/(51-59) 139/54 mmHg (06/15 0404) SpO2:  [90 %-95 %] 90 % (06/15 0404) well approximated incision. The dog scratch to the proximal area of the incision shows no signs of any infection and almost invisible. Heels are non tender and elevated off the bed using rolled towels Intake/Output from previous day: 06/14 0701 - 06/15 0700 In: 1180 [P.O.:720; I.V.:460] Out: 1320 [Urine:1050; Drains:270] Intake/Output this shift:     Recent Labs  08/10/14 0441 08/11/14 0501  HGB 9.2* 9.3*    Recent Labs  08/10/14 0441 08/11/14 0501  WBC 13.4* 12.8*  RBC 3.58* 3.55*  HCT 29.3* 28.8*  PLT 171 172    Recent Labs  08/10/14 0441  NA 134*  K 4.9  CL 102  CO2 24  BUN 8  CREATININE 0.91  GLUCOSE 243*  CALCIUM 7.4*   No results for input(s): LABPT, INR in the last 72 hours.  EXAM General - Patient is Alert, Appropriate and Oriented Extremity - Neurologically intact Neurovascular intact Sensation intact distally Intact pulses distally Dorsiflexion/Plantar flexion intact Dressing - dressing C/D/I Motor Function - intact, moving foot and toes well on exam. Good quad tone but still having some difficulty with straight leg raise.  Past Medical History  Diagnosis Date  . Anxiety   . Hypertension   . Hypothyroidism   . Diabetes mellitus without complication   . GERD  (gastroesophageal reflux disease)   . Anemia     Assessment/Plan: 2 Days Post-Op Procedure(s) (LRB): TOTAL KNEE ARTHROPLASTY (Left) Active Problems:   Left knee DJD   Total knee replacement status  Estimated body mass index is 36.01 kg/(m^2) as calculated from the following:   Height as of this encounter: 5\' 6"  (1.676 m).   Weight as of this encounter: 101.152 kg (223 lb). Up with therapy Plan for discharge tomorrow Discharge home with home health  Labs: Were reviewed on today's visit DVT Prophylaxis - Lovenox, Foot Pumps and TED hose Weight-Bearing as tolerated to left leg Needs to have a bowel movement today. We'll stick with milk of magnesia for now secondary to irritable bowel syndrome.  Jillyn Ledger. [place] 08/11/2014, 7:13 AM

## 2014-08-11 NOTE — Progress Notes (Signed)
Physical Therapy Treatment Patient Details Name: Rebecca Kirby MRN: [id number] DOB: [birth year] Today's Date: 08/11/2014    History of Present Illness This patient is a 69 year old female who came to [facility] for a L TKR.    PT Comments    Pt tolerating treatment session well, motivated and able to complete entire PT sesssion as planned. Pt continues to make progress toward goals as evidenced by performance of stairs today. Pt's greatest limitation continues to be difficulty with bed mobility and sequencing of steps with stairs which continues to limit ability to perform independent household mobility at baseline function. Patient presenting with impairment of strength, pain, range of motion, balance, and activity tolerance, limiting ability to perform ADL and mobility tasks at  baseline level of function. Patient will benefit from skilled intervention to address the above impairments and limitations, in order to restore to prior level of function, improve patient safety upon discharge, and to decrease caregiver burden.    Follow Up Recommendations  Home health PT     Equipment Recommendations  None recommended by PT    Recommendations for Other Services       Precautions / Restrictions Precautions Precautions: Fall Restrictions Weight Bearing Restrictions: Yes LLE Weight Bearing: Weight bearing as tolerated    Mobility  Bed Mobility Overal bed mobility: Needs Assistance Bed Mobility: Supine to Sit     Supine to sit: Supervision     General bed mobility comments: Pt using bed controls to bring back of bed up into full sitting position, despite PT cues to attempt bed mobility from flat to simulate bedmobility at home.   Transfers Overall transfer level: Needs assistance Equipment used: Rolling walker (2 wheeled) Transfers: Sit to/from Stand Sit to Stand: Supervision         General transfer comment: Moderate effort required, but able to accomplish in one attempt.    Ambulation/Gait Ambulation/Gait assistance: Supervision Ambulation Distance (Feet): 160 Feet Assistive device: Rolling walker (2 wheeled)   Gait velocity: 0.50 m/s Gait velocity interpretation: <1.8 ft/sec, indicative of risk for recurrent falls General Gait Details: Gait to PT gym to perform stairs training.    Stairs Stairs: Yes Stairs assistance: Min guard Stair Management: Two rails;Step to pattern Number of Stairs: 4 General stair comments: Pt given several cues for proper step sequencing, but pt demonstrating difficulty performing with 100% compliance. Will revisit again.   Wheelchair Mobility    Modified Rankin (Stroke Patients Only)       Balance Overall balance assessment: Modified Independent;No apparent balance deficits (not formally assessed);History of Falls                                  Cognition Arousal/Alertness: Awake/alert Behavior During Therapy: WFL for tasks assessed/performed Overall Cognitive Status: Within Functional Limits for tasks assessed                      Exercises Total Joint Exercises Quad Sets: Supine;10 reps;Left;Strengthening;AROM Short Arc Quad: AAROM;Strengthening;Left;Supine;15 reps Heel Slides: AAROM;Left;Strengthening;10 reps;Supine (Attempted performance with self-assisted with gait belt. ) Straight Leg Raises: Strengthening;Left;10 reps;Supine;AROM Goniometric ROM: 7-80 degrees    General Comments        Pertinent Vitals/Pain Pain Assessment: 0-10 Pain Score: 6  Pain Location: L knee  Pain Descriptors / Indicators: Constant Pain Intervention(s): Monitored during session;Ice applied;Premedicated before session    Home Living  Prior Function            PT Goals (current goals can now be found in the care plan section) Acute Rehab PT Goals Patient Stated Goal: Home with familiy assist PT Goal Formulation: With patient/family Time For Goal Achievement:  08/24/14 Potential to Achieve Goals: Good Progress towards PT goals: Progressing toward goals    Frequency  BID    PT Plan Discharge plan needs to be updated    Co-evaluation             End of Session Equipment Utilized During Treatment: Gait belt Activity Tolerance: Patient tolerated treatment well;Patient limited by pain Patient left: with family/visitor present;with call bell/phone within reach;with chair alarm set;in chair     Time: 6010-9323 PT Time Calculation (min) (ACUTE ONLY): 30 min  Charges:  $Gait Training: 8-22 mins $Therapeutic Exercise: 8-22 mins                    G Codes:      Kinzlee Selvy C 2014/09/08, 10:45 AM 10:46 AM  Etta Grandchild, PT, DPT [place] License # [id number]

## 2014-08-12 LAB — CBC
HCT: 28.2 % — ABNORMAL LOW (ref 35.0–47.0)
HEMOGLOBIN: 8.7 g/dL — AB (ref 12.0–16.0)
MCH: 25.1 pg — AB (ref 26.0–34.0)
MCHC: 30.8 g/dL — AB (ref 32.0–36.0)
MCV: 81.7 fL (ref 80.0–100.0)
Platelets: 184 10*3/uL (ref 150–440)
RBC: 3.45 MIL/uL — ABNORMAL LOW (ref 3.80–5.20)
RDW: 16.6 % — ABNORMAL HIGH (ref 11.5–14.5)
WBC: 12.5 10*3/uL — ABNORMAL HIGH (ref 3.6–11.0)

## 2014-08-12 LAB — GLUCOSE, CAPILLARY: GLUCOSE-CAPILLARY: 162 mg/dL — AB (ref 65–99)

## 2014-08-12 NOTE — Care Management Note (Signed)
Case Management Note  Patient Details  Name: Rebecca Kirby MRN: [id number] Date of Birth: August 20, 1945  Subjective/Objective:     Home health services have been arranged with [place]. [facility] updated on discharge. Patient updated on POC. She has all needed DME.                Action/Plan: Home PT with [facility]  Expected Discharge Date:    08/12/2014              Expected Discharge Plan:  [place]  In-House Referral:     Discharge planning Services  CM Consult  Post Acute Care Choice:    Choice offered to:  Patient, Adult Children  DME Arranged:  N/A DME Agency:     HH Arranged:  PT HH Agency:  [place]  Status of Service:  Completed, signed off  Medicare Important Message Given:  Yes Date Medicare IM Given:  08/12/14 Medicare IM give by:  [place] July Date Additional Medicare IM Given:    Additional Medicare Important Message give by:     If discussed at [place] of Stay Meetings, dates discussed:    Additional Comments:  Jolly Mango, RN 08/12/2014, 10:15 AM

## 2014-08-12 NOTE — Progress Notes (Signed)
Physical Therapy Treatment Patient Details Name: Rebecca Kirby MRN: [id number] DOB: 02-24-1946 Today's Date: 08/12/2014    History of Present Illness This patient is a 69 year old female who came to [facility] for a L TKR.    PT Comments    Pt tolerating treatment session well, motivated and able to complete entire PT sesssion as planned. Pt continues to make progress toward goals as evidenced by decreased level of assistance required with mobility. Pt's greatest limitation continues to be strength and indep c therex, which continues to limit ability to perform HEP at baseline function. Patient presenting with impairment of strength, pain, range of motion, balance, and activity tolerance, limiting ability to perform ADL and mobility tasks at  baseline level of function. Patient will benefit from skilled intervention to address the above impairments and limitations, in order to restore to prior level of function, improve patient safety upon discharge, and to decrease caregiver burden.    Follow Up Recommendations  Home health PT     Equipment Recommendations  None recommended by PT    Recommendations for Other Services       Precautions / Restrictions Precautions Precautions: Fall Restrictions Weight Bearing Restrictions: Yes LLE Weight Bearing: Weight bearing as tolerated    Mobility  Bed Mobility Overal bed mobility: Modified Independent Bed Mobility: Supine to Sit     Supine to sit: Modified independent (Device/Increase time) Sit to supine: Modified independent (Device/Increase time)   General bed mobility comments: Performed in flat bed with no guard rails, demonstrating good trunk strength.   Transfers Overall transfer level: Modified independent Equipment used: Rolling walker (2 wheeled) Transfers: Sit to/from Stand Sit to Stand: Modified independent (Device/Increase time)         General transfer comment: Moderate effort required, but able to accomplish in one  attempt.   Ambulation/Gait Ambulation/Gait assistance: Supervision Ambulation Distance (Feet): 180 Feet Assistive device: Rolling walker (2 wheeled) Gait Pattern/deviations: Step-to pattern   Gait velocity interpretation: <1.8 ft/sec, indicative of risk for recurrent falls General Gait Details: Near equal step length and stance time. Progressing well, asking when she will be able to ambulated without RW.    Stairs            Wheelchair Mobility    Modified Rankin (Stroke Patients Only)       Balance Overall balance assessment: Modified Independent;History of Falls;No apparent balance deficits (not formally assessed)                                  Cognition Arousal/Alertness: Awake/alert Behavior During Therapy: WFL for tasks assessed/performed Overall Cognitive Status: Within Functional Limits for tasks assessed                      Exercises Total Joint Exercises Quad Sets: Supine;10 reps;Left;Strengthening;AROM Heel Slides: AAROM;Left;10 reps;Supine Goniometric ROM: 8-95 degrees L knee flexion.     General Comments        Pertinent Vitals/Pain Pain Assessment: 0-10 Pain Score: 7  Pain Location: L knee  Pain Intervention(s): Limited activity within patient's tolerance;Monitored during session;Ice applied;Premedicated before session    Home Living Family/patient expects to be discharged to:: Private residence Living Arrangements: Alone Available Help at Discharge: Family Type of Home: Mobile home Home Access: Stairs to enter            Prior Function  PT Goals (current goals can now be found in the care plan section) Acute Rehab PT Goals Patient Stated Goal: Home with familiy assist PT Goal Formulation: With patient/family Time For Goal Achievement: 08/24/14 Potential to Achieve Goals: Good Progress towards PT goals: Progressing toward goals    Frequency  BID    PT Plan Current plan remains appropriate     Co-evaluation             End of Session   Activity Tolerance: Patient tolerated treatment well;Patient limited by pain Patient left: with family/visitor present;with call bell/phone within reach;in bed;with bed alarm set     Time: 0355-9741 PT Time Calculation (min) (ACUTE ONLY): 15 min  Charges:  $Therapeutic Activity: 8-22 mins                    G Codes:      Aldine Grainger C 26-Aug-2014, 10:04 AM  10:06 AM  Etta Grandchild, PT, DPT [place] License # [id number]

## 2014-08-12 NOTE — Discharge Summary (Signed)
Physician Discharge Summary  Patient ID: Rebecca Kirby MRN: [id number] DOB/AGE: [birth year] 71 y.o.  Admit date: 08/09/2014 Discharge date: 08/12/2014  Admission Diagnoses:  DEGENERATIVE OSTEOARTHRITIS   Discharge Diagnoses: Patient Active Problem List   Diagnosis Date Noted  . 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 Total knee replacement status 08/09/2014    Past Medical History  Diagnosis Date  . Anxiety   . Hypertension   . Hypothyroidism   . Diabetes mellitus without complication   . GERD (gastroesophageal reflux disease)   . Anemia      Transfusion: Autovac transfusions given the first 6 hours postoperatively   Consultants (if any):   there is no consultations ordered on this admission  Discharged Condition: Improved  Hospital Course: Rebecca Kirby is an 69 y.o. female who was admitted 08/09/2014 with a diagnosis of degenerative arthrosis left knee and went to the operating room on 08/09/2014 and underwent the above named procedures.    Surgeries:Procedure(s): TOTAL KNEE ARTHROPLASTY on 08/09/2014  ANESTHESIA: spinal  ESTIMATED BLOOD LOSS: 50 mL  FLUIDS REPLACED: 1300 mL of crystalloid  TOURNIQUET TIME: 99 minutes  DRAINS: 2 medium drains to a reinfusion system  SOFT TISSUE RELEASES: Anterior cruciate ligament, posterior cruciate ligament, deep medial collateral ligament, patellofemoral ligament   IMPLANTS UTILIZED: DePuy PFC Sigma size 3 posterior stabilized femoral component (cemented), size 3 MBT tibial component (cemented), 35 mm 3 peg oval dome patella (cemented), and a 10 mm stabilized rotating platform polyethylene insert.  INDICATIONS FOR SURGERY: Rebecca Kirby is a 69 y.o. year old female with a long history of progressive knee pain. X-rays demonstrated severe degenerative changes in tricompartmental fashion. He had not seen any significant improvement despite conservative nonsurgical intervention. After discussion of the risks and benefits of surgical  intervention, the patient expressed understanding of the risks benefits and agree with plans for total knee arthroplasty.   Patient tolerated the surgery well. No complications .Patient was taken to PACU where she was stabilized and then transferred to the orthopedic floor.  Patient started on Lovenox 30 q 12 hrs. Foot pumps applied bilaterally at 80 mmHg. Heels elevated off bed with rolled towels. No evidence of DVT. Calves non tender. Negative Homan. Physical therapy started on day #1 for gait training and transfer with OT starting on  day #1 for ADL and assisted devices. Patient has done well with therapy. Ambulated greater than 200 feet upon being discharged. Able to go up 4 steps independent with bed to chair transfers.  Patient's IV , foley day #1 and hemovac was d/c on day #2.   She was given perioperative antibiotics:  Anti-infectives    Start     Dose/Rate Route Frequency Ordered Stop   08/09/14 1300  ceFAZolin (ANCEF) IVPB 2 g/50 mL premix     2 g 100 mL/hr over 30 Minutes Intravenous Every 6 hours 08/09/14 1247 08/10/14 0650   08/09/14 0615  ceFAZolin (ANCEF) IVPB 2 g/50 mL premix     2 g 100 mL/hr over 30 Minutes Intravenous  Once 08/09/14 0600 08/09/14 0747   08/09/14 0554  ceFAZolin (ANCEF) 2-3 GM-% IVPB SOLR    Comments:  Idamae Lusher: cabinet override      08/09/14 0554 08/09/14 1759    .  She was given sequential compression devices, early ambulation, and TED stockings and Lovenox for DVT prophylaxis.  She benefited maximally from the hospital stay and there were no complications.    Recent vital signs:  Filed Vitals:   08/12/14  0318  BP: 156/72  Pulse: 91  Temp: 99.3 F (37.4 C)  Resp: 18    Recent laboratory studies:  Lab Results  Component Value Date   HGB 8.7* 08/12/2014   HGB 9.3* 08/11/2014   HGB 9.2* 08/10/2014   Lab Results  Component Value Date   WBC 12.5* 08/12/2014   PLT 184 08/12/2014   Lab Results  Component Value Date   INR 1.08  07/29/2014   Lab Results  Component Value Date   NA 134* 08/10/2014   K 4.9 08/10/2014   CL 102 08/10/2014   CO2 24 08/10/2014   BUN 8 08/10/2014   CREATININE 0.91 08/10/2014   GLUCOSE 243* 08/10/2014    Discharge Medications:     Medication List    STOP taking these medications        HYDROcodone-acetaminophen 10-325 MG per tablet  Commonly known as:  NORCO      TAKE these medications        ALPRAZolam 0.5 MG tablet  Commonly known as:  XANAX  Take 0.5 mg by mouth 2 (two) times daily as needed for anxiety.     amLODipine-olmesartan 5-20 MG per tablet  Commonly known as:  AZOR  Take 1 tablet by mouth daily.     calcitRIOL 0.5 MCG capsule  Commonly known as:  ROCALTROL  Take 0.5 mcg by mouth daily.     calcium acetate 667 MG capsule  Commonly known as:  PHOSLO  Take by mouth 2 (two) times daily.     citalopram 20 MG tablet  Commonly known as:  CELEXA  Take 20 mg by mouth daily.     enoxaparin 40 MG/0.4ML injection  Commonly known as:  LOVENOX  Inject 0.4 mLs (40 mg total) into the skin daily.  Start taking on:  08/13/2014     FOLIVANE-F 125-1 MG Caps  Take 1 capsule by mouth 2 (two) times daily.     glipiZIDE 10 MG tablet  Commonly known as:  GLUCOTROL  Take 10 mg by mouth 2 (two) times daily before a meal.     levothyroxine 150 MCG tablet  Commonly known as:  SYNTHROID, LEVOTHROID  Take 150 mcg by mouth daily before breakfast.     metFORMIN 1000 MG tablet  Commonly known as:  GLUCOPHAGE  Take 1,000 mg by mouth 2 (two) times daily with a meal.     nebivolol 5 MG tablet  Commonly known as:  BYSTOLIC  Take 5 mg by mouth daily.     oxyCODONE 5 MG immediate release tablet  Commonly known as:  Oxy IR/ROXICODONE  Take 1-2 tablets (5-10 mg total) by mouth every 3 (three) hours as needed for breakthrough pain.     pantoprazole 40 MG tablet  Commonly known as:  PROTONIX  Take 40 mg by mouth daily.     pioglitazone 30 MG tablet  Commonly known as:   ACTOS  Take 30 mg by mouth daily.     sitaGLIPtin 100 MG tablet  Commonly known as:  JANUVIA  Take 100 mg by mouth daily.     traMADol 50 MG tablet  Commonly known as:  ULTRAM  Take 1-2 tablets (50-100 mg total) by mouth every 4 (four) hours as needed for moderate pain.     vitamin B-12 1000 MCG tablet  Commonly known as:  CYANOCOBALAMIN  Take 1,000 mcg by mouth daily.     Vitamin D-3 1000 UNITS Caps  Take 1 capsule by mouth daily.  Diagnostic Studies: Dg Knee Left Port  08/09/2014   CLINICAL DATA:  Left knee arthroplasty  EXAM: PORTABLE LEFT KNEE - 1-2 VIEW  COMPARISON:  None.  FINDINGS: Left knee arthroplasty in satisfactory position.  Surgical drain.  Subcutaneous gas.  Overlying skin staples.  No fracture or dislocation is seen.  IMPRESSION: Left knee arthroplasty in satisfactory position.   Electronically Signed   By: Julian Hy M.D.   On: 08/09/2014 12:07    Disposition:       Discharge Instructions    Diet - low sodium heart healthy    Complete by:  As directed      Diet - low sodium heart healthy    Complete by:  As directed      Increase activity slowly    Complete by:  As directed      Increase activity slowly    Complete by:  As directed            Follow-up Information    Follow up with [facility] R[facility] On 08/24/2014.   Specialty:  Physician Assistant   Why:  Appointment is at 8:30   Contact information:   [address] [place] [address] [phone]       Follow up with Dereck Leep, MD On 09/21/2014.   Specialty:  Orthopedic Surgery   Why:  Appointment is at 9:15      Please follow up.   Why:  Patient should already have an appointment scheduled for 08/24/2014 at 8:30       Signed: Kris Burd R. 08/12/2014, 7:37 AM

## 2014-08-12 NOTE — Progress Notes (Signed)
   Subjective: 3 Days Post-Op Procedure(s) (LRB): TOTAL KNEE ARTHROPLASTY (Left) Patient reports pain as moderate.   Patient is well, and has had no acute complaints or problems Continue with physical therapy today.  Plan is to go Home after hospital stay. no nausea and no vomiting Patient denies any chest pains or shortness of breath. Objective: Vital signs in last 24 hours: Temp:  [98.3 F (36.8 C)-99.3 F (37.4 C)] 99.3 F (37.4 C) (06/16 0318) Pulse Rate:  [80-91] 91 (06/16 0318) Resp:  [16-18] 18 (06/16 0318) BP: (148-156)/(53-72) 156/72 mmHg (06/16 0318) SpO2:  [94 %-97 %] 97 % (06/16 0318) well approximated incision Heels are non tender and elevated off the bed using rolled towels Intake/Output from previous day: 06/15 0701 - 06/16 0700 In: 720 [P.O.:720] Out: 1475 [Urine:1475] Intake/Output this shift: Total I/O In: -  Out: 350 [Urine:350]   Recent Labs  08/10/14 0441 08/11/14 0501 08/12/14 0455  HGB 9.2* 9.3* 8.7*    Recent Labs  08/11/14 0501 08/12/14 0455  WBC 12.8* 12.5*  RBC 3.55* 3.45*  HCT 28.8* 28.2*  PLT 172 184    Recent Labs  08/10/14 0441  NA 134*  K 4.9  CL 102  CO2 24  BUN 8  CREATININE 0.91  GLUCOSE 243*  CALCIUM 7.4*   No results for input(s): LABPT, INR in the last 72 hours.  EXAM General - Patient is Alert, Appropriate and Oriented Extremity - Neurologically intact Neurovascular intact Sensation intact distally Intact pulses distally Dorsiflexion/Plantar flexion intact Dressing - scant drainage Motor Function - intact, moving foot and toes well on exam. Has good quad tone. Right leg raise on her own. Good strength against resistance of dorsiflexion and plantarflexion of the foot  Past Medical History  Diagnosis Date  . Anxiety   . Hypertension   . Hypothyroidism   . Diabetes mellitus without complication   . GERD (gastroesophageal reflux disease)   . Anemia     Assessment/Plan: 3 Days Post-Op Procedure(s)  (LRB): TOTAL KNEE ARTHROPLASTY (Left) Active Problems:   Left knee DJD   Total knee replacement status  Estimated body mass index is 36.01 kg/(m^2) as calculated from the following:   Height as of this encounter: 5\' 6"  (1.676 m).   Weight as of this encounter: 101.152 kg (223 lb). Up with therapy Discharge home with home health  Labs: Labs were reviewed DVT Prophylaxis - Lovenox, Foot Pumps and TED hose Weight-Bearing as tolerated to left leg D/C O2 and Pulse OX and try on Room Air  Jon R. Duenweg Arnold Line 08/12/2014, 7:34 AM

## 2014-08-12 NOTE — Progress Notes (Signed)
Discharge instructions completed with patient and daughter.  Discharged home with daughter, HHPT.  No complaints at time of discharge.

## 2014-08-26 ENCOUNTER — Inpatient Hospital Stay: Payer: Medicare Other | Attending: Oncology | Admitting: Oncology

## 2014-08-26 ENCOUNTER — Inpatient Hospital Stay: Payer: Medicare Other

## 2014-08-26 ENCOUNTER — Other Ambulatory Visit: Payer: Self-pay | Admitting: *Deleted

## 2014-08-26 ENCOUNTER — Encounter: Payer: Self-pay | Admitting: Oncology

## 2014-08-26 VITALS — BP 108/75 | HR 86 | Temp 98.6°F | Wt 199.5 lb

## 2014-08-26 DIAGNOSIS — Z85038 Personal history of other malignant neoplasm of large intestine: Secondary | ICD-10-CM | POA: Insufficient documentation

## 2014-08-26 DIAGNOSIS — C189 Malignant neoplasm of colon, unspecified: Secondary | ICD-10-CM

## 2014-08-26 DIAGNOSIS — Z87891 Personal history of nicotine dependence: Secondary | ICD-10-CM | POA: Insufficient documentation

## 2014-08-26 DIAGNOSIS — E890 Postprocedural hypothyroidism: Secondary | ICD-10-CM | POA: Diagnosis not present

## 2014-08-26 DIAGNOSIS — D649 Anemia, unspecified: Secondary | ICD-10-CM | POA: Insufficient documentation

## 2014-08-26 DIAGNOSIS — Z9049 Acquired absence of other specified parts of digestive tract: Secondary | ICD-10-CM | POA: Diagnosis not present

## 2014-08-26 DIAGNOSIS — K219 Gastro-esophageal reflux disease without esophagitis: Secondary | ICD-10-CM | POA: Diagnosis not present

## 2014-08-26 DIAGNOSIS — I10 Essential (primary) hypertension: Secondary | ICD-10-CM | POA: Diagnosis not present

## 2014-08-26 DIAGNOSIS — M2550 Pain in unspecified joint: Secondary | ICD-10-CM | POA: Insufficient documentation

## 2014-08-26 DIAGNOSIS — F419 Anxiety disorder, unspecified: Secondary | ICD-10-CM | POA: Insufficient documentation

## 2014-08-26 DIAGNOSIS — R221 Localized swelling, mass and lump, neck: Secondary | ICD-10-CM | POA: Diagnosis not present

## 2014-08-26 DIAGNOSIS — Z79899 Other long term (current) drug therapy: Secondary | ICD-10-CM | POA: Diagnosis not present

## 2014-08-26 DIAGNOSIS — C649 Malignant neoplasm of unspecified kidney, except renal pelvis: Secondary | ICD-10-CM | POA: Diagnosis present

## 2014-08-26 DIAGNOSIS — E119 Type 2 diabetes mellitus without complications: Secondary | ICD-10-CM | POA: Insufficient documentation

## 2014-08-26 HISTORY — DX: Malignant neoplasm of unspecified kidney, except renal pelvis: C64.9

## 2014-08-26 LAB — COMPREHENSIVE METABOLIC PANEL
ALBUMIN: 4 g/dL (ref 3.5–5.0)
ALT: 15 U/L (ref 14–54)
ANION GAP: 11 (ref 5–15)
AST: 29 U/L (ref 15–41)
Alkaline Phosphatase: 81 U/L (ref 38–126)
BILIRUBIN TOTAL: 0.4 mg/dL (ref 0.3–1.2)
BUN: 13 mg/dL (ref 6–20)
CALCIUM: 8.5 mg/dL — AB (ref 8.9–10.3)
CO2: 26 mmol/L (ref 22–32)
Chloride: 98 mmol/L — ABNORMAL LOW (ref 101–111)
Creatinine, Ser: 1.19 mg/dL — ABNORMAL HIGH (ref 0.44–1.00)
GFR calc Af Amer: 52 mL/min — ABNORMAL LOW (ref >60)
GFR, EST NON AFRICAN AMERICAN: 45 mL/min — AB (ref >60)
Glucose, Bld: 177 mg/dL — ABNORMAL HIGH (ref 65–99)
Potassium: 4.9 mmol/L (ref 3.5–5.1)
SODIUM: 135 mmol/L (ref 135–145)
Total Protein: 7.6 g/dL (ref 6.5–8.1)

## 2014-08-26 LAB — CBC WITH DIFFERENTIAL/PLATELET
Basophils Absolute: 0.1 10*3/uL (ref 0–0.1)
Basophils Relative: 1 %
EOS ABS: 0.3 10*3/uL (ref 0–0.7)
EOS PCT: 3 %
HCT: 31.5 % — ABNORMAL LOW (ref 35.0–47.0)
HEMOGLOBIN: 9.7 g/dL — AB (ref 12.0–16.0)
Lymphocytes Relative: 12 %
Lymphs Abs: 1.4 10*3/uL (ref 1.0–3.6)
MCH: 24.8 pg — AB (ref 26.0–34.0)
MCHC: 30.7 g/dL — ABNORMAL LOW (ref 32.0–36.0)
MCV: 80.6 fL (ref 80.0–100.0)
Monocytes Absolute: 0.5 10*3/uL (ref 0.2–0.9)
Monocytes Relative: 5 %
Neutro Abs: 8.8 10*3/uL — ABNORMAL HIGH (ref 1.4–6.5)
Neutrophils Relative %: 79 %
PLATELETS: 366 10*3/uL (ref 150–440)
RBC: 3.91 MIL/uL (ref 3.80–5.20)
RDW: 15.9 % — ABNORMAL HIGH (ref 11.5–14.5)
WBC: 11.1 10*3/uL — ABNORMAL HIGH (ref 3.6–11.0)

## 2014-08-26 MED ORDER — CITALOPRAM HYDROBROMIDE 40 MG PO TABS: 40.0000 mg | ORAL_TABLET | Freq: Every day | ORAL | Status: DC

## 2014-08-26 NOTE — Progress Notes (Signed)
[facility]  [phone]  Fax:([phone]) [place] DOB: June 20, 1945  MR#: [id number]  LEX#:[id number]  Patient Care Team: Idelle Crouch, MD as PCP - General (Internal Medicine)  CHIEF COMPLAINT:  Chief Complaint  Patient presents with  . Follow-up   Carcinoma of sigmoid colon, status post colectomy. 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 T3 a NXM0 Status post thyroidectomy for enlarged thyroid gland in December 2015. No evidence of thyroid cancer but suspected metastatic disease from kidney cancer.  INTERVAL HISTORY:  Patient is here for continued evaluation and treatment consideration regarding a history of colon CA, clear cell carcinoma of the kidney, as well as recent thyroidectomy due to enlarging thyroid gland. On pathology there was no evidence of primary thyroid cancer however there was an area of concern suggesting metastasis from the kidney cancer. Patient continues to have some swelling in the neck area. She has recently had her left knee replaced and is working with physical therapy. She otherwise denies any complaints other than the cost of medications.  REVIEW OF SYSTEMS:   Review of Systems  Constitutional: Negative for fever, chills, weight loss, malaise/fatigue and diaphoresis.  HENT: Negative for congestion, ear discharge, ear pain, hearing loss, nosebleeds, sore throat and tinnitus.   Eyes: Negative for blurred vision, double vision, photophobia, pain, discharge and redness.  Respiratory: Negative for cough, hemoptysis, sputum production, shortness of breath, wheezing and stridor.   Cardiovascular: Negative for chest pain, palpitations, orthopnea, claudication, leg swelling and PND.  Gastrointestinal: Negative for heartburn, nausea, vomiting, abdominal pain, diarrhea, constipation, blood in stool and melena.  Genitourinary: Negative.   Musculoskeletal: Positive for joint  pain.       Recent knee replacement  Skin: Negative.   Neurological: Negative for dizziness, tingling, focal weakness, seizures, weakness and headaches.  Endo/Heme/Allergies: Does not bruise/bleed easily.  Psychiatric/Behavioral: Negative for depression. The patient is not nervous/anxious and does not have insomnia.     As per HPI. Otherwise, a complete review of systems is negatve.  ONCOLOGY HISTORY:  No history exists.    PAST MEDICAL HISTORY: Past Medical History  Diagnosis Date  . Anxiety   . Hypertension   . Hypothyroidism   . Diabetes mellitus without complication   . GERD (gastroesophageal reflux disease)   . Anemia   . Kidney cancer, primary, with metastasis from kidney to other site 08/26/2014    PAST SURGICAL HISTORY: Past Surgical History  Procedure Laterality Date  . Colon resection    . Rt kidney removed Right   . Cholecystectomy    . Thyroid surgery Bilateral   . Total knee arthroplasty Right   . Total knee arthroplasty Left 08/09/2014    Procedure: TOTAL KNEE ARTHROPLASTY;  Surgeon: Dereck Leep, MD;  Location: [facility] ORS;  Service: Orthopedics;  Laterality: Left;    FAMILY HISTORY No family history on file.  GYNECOLOGIC HISTORY:  No LMP recorded. Patient is postmenopausal.     ADVANCED DIRECTIVES:    HEALTH MAINTENANCE: History  Substance Use Topics  . Smoking status: Former Smoker    Quit date: 07/29/1994  . Smokeless tobacco: Not on file  . Alcohol Use: No     Colonoscopy:  PAP:  Bone density:  Lipid panel:  No Known Allergies  Current Outpatient Prescriptions  Medication Sig Dispense Refill  . ALPRAZolam (XANAX) 0.5 MG tablet Take 0.5 mg by mouth 2 (two) times daily as needed for  anxiety.    Marland Kitchen amLODipine-olmesartan (AZOR) 5-20 MG per tablet Take 1 tablet by mouth daily.    . calcitRIOL (ROCALTROL) 0.5 MCG capsule Take 0.5 mcg by mouth daily.    . calcium acetate (PHOSLO) 667 MG capsule Take by mouth 2 (two) times daily.    .  Cholecalciferol (VITAMIN D-3) 1000 UNITS CAPS Take 1 capsule by mouth daily.    Marland Kitchen enoxaparin (LOVENOX) 40 MG/0.4ML injection Inject 0.4 mLs (40 mg total) into the skin daily. 14 Syringe 0  . Fe Fum-FePoly-FA-Vit C-Vit B3 (FOLIVANE-F) 125-1 MG CAPS Take 1 capsule by mouth 2 (two) times daily.    Marland Kitchen glipiZIDE (GLUCOTROL) 10 MG tablet Take 10 mg by mouth 2 (two) times daily before a meal.    . levothyroxine (SYNTHROID, LEVOTHROID) 150 MCG tablet Take 150 mcg by mouth daily before breakfast.    . metFORMIN (GLUCOPHAGE) 1000 MG tablet Take 1,000 mg by mouth 2 (two) times daily with a meal.    . nebivolol (BYSTOLIC) 5 MG tablet Take 5 mg by mouth daily.    Marland Kitchen oxyCODONE (OXY IR/ROXICODONE) 5 MG immediate release tablet Take 1-2 tablets (5-10 mg total) by mouth every 3 (three) hours as needed for breakthrough pain. 80 tablet 0  . pantoprazole (PROTONIX) 40 MG tablet Take 40 mg by mouth daily.    . pioglitazone (ACTOS) 30 MG tablet Take 30 mg by mouth daily.    . sitaGLIPtin (JANUVIA) 100 MG tablet Take 100 mg by mouth daily.    . traMADol (ULTRAM) 50 MG tablet Take 1-2 tablets (50-100 mg total) by mouth every 4 (four) hours as needed for moderate pain. 60 tablet 1  . vitamin B-12 (CYANOCOBALAMIN) 1000 MCG tablet Take 1,000 mcg by mouth daily.    . citalopram (CELEXA) 40 MG tablet Take 1 tablet (40 mg total) by mouth daily. 90 tablet 1   No current facility-administered medications for this visit.    OBJECTIVE: BP 108/75 mmHg  Pulse 86  Temp(Src) 98.6 F (37 C) (Tympanic)  Wt 199 lb 8.3 oz (90.5 kg)   Body mass index is 32.22 kg/(m^2).    ECOG FS:1 - Symptomatic but completely ambulatory  General: Well-developed, well-nourished, no acute distress. Eyes: Pink conjunctiva, anicteric sclera. HEENT: Normocephalic, moist mucous membranes, clear oropharnyx. Some edema collected in neck. Lungs: Clear to auscultation bilaterally. Heart: Regular rate and rhythm. No rubs, murmurs, or gallops. Abdomen: Soft,  nontender, nondistended. No organomegaly noted, normoactive bowel sounds. Musculoskeletal: No edema, cyanosis, or clubbing. Recent left knee replacement Neuro: Alert, answering all questions appropriately. Cranial nerves grossly intact. Skin: No rashes or petechiae noted. Psych: Normal affect. Lymphatics: No cervical, calvicular, axillary or inguinal LAD.   LAB RESULTS:  Appointment on 08/26/2014  Component Date Value Ref Range Status  . WBC 08/26/2014 11.1* 3.6 - 11.0 K/uL Final  . RBC 08/26/2014 3.91  3.80 - 5.20 MIL/uL Final  . Hemoglobin 08/26/2014 9.7* 12.0 - 16.0 g/dL Final  . HCT 08/26/2014 31.5* 35.0 - 47.0 % Final  . MCV 08/26/2014 80.6  80.0 - 100.0 fL Final  . MCH 08/26/2014 24.8* 26.0 - 34.0 pg Final  . MCHC 08/26/2014 30.7* 32.0 - 36.0 g/dL Final  . RDW 08/26/2014 15.9* 11.5 - 14.5 % Final  . Platelets 08/26/2014 366  150 - 440 K/uL Final  . Neutrophils Relative % 08/26/2014 79   Final  . Neutro Abs 08/26/2014 8.8* 1.4 - 6.5 K/uL Final  . Lymphocytes Relative 08/26/2014 12   Final  .  Lymphs Abs 08/26/2014 1.4  1.0 - 3.6 K/uL Final  . Monocytes Relative 08/26/2014 5   Final  . Monocytes Absolute 08/26/2014 0.5  0.2 - 0.9 K/uL Final  . Eosinophils Relative 08/26/2014 3   Final  . Eosinophils Absolute 08/26/2014 0.3  0 - 0.7 K/uL Final  . Basophils Relative 08/26/2014 1   Final  . Basophils Absolute 08/26/2014 0.1  0 - 0.1 K/uL Final  . Sodium 08/26/2014 135  135 - 145 mmol/L Final  . Potassium 08/26/2014 4.9  3.5 - 5.1 mmol/L Final  . Chloride 08/26/2014 98* 101 - 111 mmol/L Final  . CO2 08/26/2014 26  22 - 32 mmol/L Final  . Glucose, Bld 08/26/2014 177* 65 - 99 mg/dL Final  . BUN 08/26/2014 13  6 - 20 mg/dL Final  . Creatinine, Ser 08/26/2014 1.19* 0.44 - 1.00 mg/dL Final  . Calcium 08/26/2014 8.5* 8.9 - 10.3 mg/dL Final  . Total Protein 08/26/2014 7.6  6.5 - 8.1 g/dL Final  . Albumin 08/26/2014 4.0  3.5 - 5.0 g/dL Final  . AST 08/26/2014 29  15 - 41 U/L Final  .  ALT 08/26/2014 15  14 - 54 U/L Final  . Alkaline Phosphatase 08/26/2014 81  38 - 126 U/L Final  . Total Bilirubin 08/26/2014 0.4  0.3 - 1.2 mg/dL Final  . GFR calc non Af Amer 08/26/2014 45* >60 mL/min Final  . GFR calc Af Amer 08/26/2014 52* >60 mL/min Final   Comment: (NOTE) The eGFR has been calculated using the CKD EPI equation. This calculation has not been validated in all clinical situations. eGFR's persistently <60 mL/min signify possible Chronic Kidney Disease.   . Anion gap 08/26/2014 11  5 - 15 Final    STUDIES: No results found.  ASSESSMENT:  Carcinoma of colon Carcinoma of kidney  PLAN:   1. Colon CA. Clinically there is no evidence of disease. Colonoscopy was performed in May 2014 and was reported as negative. 2. Kidney CA, clear cell. Following recent thyroidectomy, patient was found to have metastasis from the kidney to the thyroid. Patient had a PET scan in January 2016 that did not reveal any other evidence of metastatic disease. Patient is now considered stage IV for renal cell CA but with no other evidence of disease at this time.  Discussed with patient and patient's daughter their concerns regarding financial issues. Specifically the high cost of medications. [place] with care management for referral. He will contact patient's daughter directly to see if there is any assistance that he may offer. We'll continue with follow-up in 6 months.  Patient expressed understanding and was in agreement with this plan. She also understands that She can call clinic at any time with any questions, concerns, or complaints.   Dr. Oliva Bustard was available for consultation and review of plan of care for this patient.   Evlyn Kanner, NP   08/26/2014 12:33 PM

## 2014-08-26 NOTE — Progress Notes (Signed)
Patient does have living will.  Former smoker. 

## 2014-08-27 LAB — CEA: CEA: 2.2 ng/mL (ref 0.0–4.7)

## 2014-09-08 ENCOUNTER — Other Ambulatory Visit: Payer: Self-pay

## 2014-09-08 NOTE — Progress Notes (Unsigned)
PSN met with patient's daughter about medication cost, and obtaining clarification about hospital bills.  Daughter referred to patient financial services about the bills.  PSN gave her information about the Medication Management program.

## 2014-09-15 ENCOUNTER — Other Ambulatory Visit: Payer: Self-pay | Admitting: Internal Medicine

## 2014-09-15 DIAGNOSIS — Z1231 Encounter for screening mammogram for malignant neoplasm of breast: Secondary | ICD-10-CM

## 2014-09-23 ENCOUNTER — Other Ambulatory Visit: Payer: Self-pay | Admitting: Internal Medicine

## 2014-09-23 ENCOUNTER — Ambulatory Visit
Admission: RE | Admit: 2014-09-23 | Discharge: 2014-09-23 | Disposition: A | Discharge status: Home / Self Care | Payer: Medicare Other | Source: Ambulatory Visit | Attending: Internal Medicine | Admitting: Internal Medicine

## 2014-09-23 DIAGNOSIS — Z1231 Encounter for screening mammogram for malignant neoplasm of breast: Secondary | ICD-10-CM | POA: Insufficient documentation

## 2014-09-23 HISTORY — DX: Malignant neoplasm of colon, unspecified: C18.9

## 2014-09-23 HISTORY — DX: Malignant neoplasm of thyroid gland: C73

## 2015-01-05 DIAGNOSIS — E079 Disorder of thyroid, unspecified: Secondary | ICD-10-CM | POA: Insufficient documentation

## 2015-01-23 ENCOUNTER — Other Ambulatory Visit: Payer: Self-pay | Admitting: Family Medicine

## 2015-02-28 ENCOUNTER — Ambulatory Visit: Payer: Medicare Other | Admitting: Oncology

## 2015-02-28 ENCOUNTER — Other Ambulatory Visit: Payer: Medicare Other

## 2015-03-08 ENCOUNTER — Inpatient Hospital Stay: Payer: Medicare Other | Admitting: Oncology

## 2015-03-08 ENCOUNTER — Inpatient Hospital Stay: Payer: Medicare Other

## 2015-03-16 ENCOUNTER — Inpatient Hospital Stay: Payer: Medicare Other

## 2015-03-16 ENCOUNTER — Encounter: Payer: Self-pay | Admitting: Oncology

## 2015-03-16 ENCOUNTER — Inpatient Hospital Stay: Payer: Medicare Other | Attending: Oncology | Admitting: Oncology

## 2015-03-16 VITALS — BP 179/72 | HR 78 | Temp 96.4°F | Wt 203.3 lb

## 2015-03-16 DIAGNOSIS — Z87891 Personal history of nicotine dependence: Secondary | ICD-10-CM | POA: Diagnosis not present

## 2015-03-16 DIAGNOSIS — Z803 Family history of malignant neoplasm of breast: Secondary | ICD-10-CM | POA: Insufficient documentation

## 2015-03-16 DIAGNOSIS — E119 Type 2 diabetes mellitus without complications: Secondary | ICD-10-CM | POA: Diagnosis not present

## 2015-03-16 DIAGNOSIS — Z7984 Long term (current) use of oral hypoglycemic drugs: Secondary | ICD-10-CM | POA: Insufficient documentation

## 2015-03-16 DIAGNOSIS — Z85038 Personal history of other malignant neoplasm of large intestine: Secondary | ICD-10-CM | POA: Diagnosis not present

## 2015-03-16 DIAGNOSIS — D649 Anemia, unspecified: Secondary | ICD-10-CM | POA: Diagnosis not present

## 2015-03-16 DIAGNOSIS — C649 Malignant neoplasm of unspecified kidney, except renal pelvis: Secondary | ICD-10-CM | POA: Diagnosis not present

## 2015-03-16 DIAGNOSIS — Z905 Acquired absence of kidney: Secondary | ICD-10-CM | POA: Diagnosis not present

## 2015-03-16 DIAGNOSIS — Z79899 Other long term (current) drug therapy: Secondary | ICD-10-CM | POA: Insufficient documentation

## 2015-03-16 DIAGNOSIS — N289 Disorder of kidney and ureter, unspecified: Secondary | ICD-10-CM | POA: Insufficient documentation

## 2015-03-16 DIAGNOSIS — E890 Postprocedural hypothyroidism: Secondary | ICD-10-CM | POA: Diagnosis not present

## 2015-03-16 DIAGNOSIS — F419 Anxiety disorder, unspecified: Secondary | ICD-10-CM | POA: Insufficient documentation

## 2015-03-16 DIAGNOSIS — S52209A Unspecified fracture of shaft of unspecified ulna, initial encounter for closed fracture: Secondary | ICD-10-CM | POA: Insufficient documentation

## 2015-03-16 DIAGNOSIS — C189 Malignant neoplasm of colon, unspecified: Secondary | ICD-10-CM | POA: Insufficient documentation

## 2015-03-16 DIAGNOSIS — E039 Hypothyroidism, unspecified: Secondary | ICD-10-CM | POA: Insufficient documentation

## 2015-03-16 DIAGNOSIS — E559 Vitamin D deficiency, unspecified: Secondary | ICD-10-CM | POA: Insufficient documentation

## 2015-03-16 DIAGNOSIS — Z8781 Personal history of (healed) traumatic fracture: Secondary | ICD-10-CM | POA: Insufficient documentation

## 2015-03-16 DIAGNOSIS — I10 Essential (primary) hypertension: Secondary | ICD-10-CM | POA: Insufficient documentation

## 2015-03-16 DIAGNOSIS — R221 Localized swelling, mass and lump, neck: Secondary | ICD-10-CM

## 2015-03-16 DIAGNOSIS — K219 Gastro-esophageal reflux disease without esophagitis: Secondary | ICD-10-CM | POA: Diagnosis not present

## 2015-03-16 LAB — COMPREHENSIVE METABOLIC PANEL
ALK PHOS: 59 U/L (ref 38–126)
ALT: 13 U/L — ABNORMAL LOW (ref 14–54)
AST: 19 U/L (ref 15–41)
Albumin: 3.9 g/dL (ref 3.5–5.0)
Anion gap: 6 (ref 5–15)
BUN: 20 mg/dL (ref 6–20)
CALCIUM: 8.5 mg/dL — AB (ref 8.9–10.3)
CO2: 26 mmol/L (ref 22–32)
CREATININE: 1.07 mg/dL — AB (ref 0.44–1.00)
Chloride: 103 mmol/L (ref 101–111)
GFR, EST AFRICAN AMERICAN: 59 mL/min — AB (ref >60)
GFR, EST NON AFRICAN AMERICAN: 51 mL/min — AB (ref >60)
Glucose, Bld: 151 mg/dL — ABNORMAL HIGH (ref 65–99)
Potassium: 4.7 mmol/L (ref 3.5–5.1)
Sodium: 135 mmol/L (ref 135–145)
TOTAL PROTEIN: 7.2 g/dL (ref 6.5–8.1)
Total Bilirubin: 0.4 mg/dL (ref 0.3–1.2)

## 2015-03-16 LAB — CBC WITH DIFFERENTIAL/PLATELET
Basophils Absolute: 0.1 10*3/uL (ref 0–0.1)
Basophils Relative: 1 %
Eosinophils Absolute: 0.2 10*3/uL (ref 0–0.7)
Eosinophils Relative: 2 %
HEMATOCRIT: 30.2 % — AB (ref 35.0–47.0)
Hemoglobin: 9.7 g/dL — ABNORMAL LOW (ref 12.0–16.0)
LYMPHS ABS: 1.2 10*3/uL (ref 1.0–3.6)
LYMPHS PCT: 14 %
MCH: 25.8 pg — AB (ref 26.0–34.0)
MCHC: 31.9 g/dL — ABNORMAL LOW (ref 32.0–36.0)
MCV: 80.7 fL (ref 80.0–100.0)
MONO ABS: 0.4 10*3/uL (ref 0.2–0.9)
Monocytes Relative: 5 %
NEUTROS ABS: 6.6 10*3/uL — AB (ref 1.4–6.5)
Neutrophils Relative %: 78 %
PLATELETS: 229 10*3/uL (ref 150–440)
RBC: 3.75 MIL/uL — ABNORMAL LOW (ref 3.80–5.20)
RDW: 17 % — ABNORMAL HIGH (ref 11.5–14.5)
WBC: 8.4 10*3/uL (ref 3.6–11.0)

## 2015-03-16 NOTE — Progress Notes (Signed)
[place]  Telephone:(336) [phone]  Fax:([phone]) [place] DOB: 12/24/1945  MR#: [id number]  KVQ#:[id number]  Patient Care Team: Idelle Crouch, MD as PCP - General (Internal Medicine)  CHIEF COMPLAINT:  Chief Complaint  Patient presents with  . Kidney Cancer   Carcinoma of sigmoid colon, status post colectomy. 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 T3 a NXM0 Status post thyroidectomy for enlarged thyroid gland in December 2015. No evidence of thyroid cancer but suspected metastatic disease from kidney cancer.  INTERVAL HISTORY:  Patient is here for continued evaluation and treatment consideration regarding a history of colon CA, clear cell carcinoma of the kidney, as well as recent thyroidectomy due to enlarging thyroid gland. On pathology there was no evidence of primary thyroid cancer however there was an area of concern suggesting metastasis from the kidney cancer. Patient continues to have some swelling in the neck area. She has recently had her left knee replaced and is working with physical therapy. She otherwise denies any complaints other than the cost of medications. Patient is scheduled for colonoscopy next year. Mammogram in June of 2016.  Last PET scan was done in January of 2016 Patient is here for further follow-up and treatment consideration No chills fever. Hemoglobin continues to be low  REVIEW OF SYSTEMS:   Review of Systems  Constitutional: Negative for fever, chills, weight loss, malaise/fatigue and diaphoresis.  HENT: Negative for congestion, ear discharge, ear pain, hearing loss, nosebleeds, sore throat and tinnitus.   Eyes: Negative for blurred vision, double vision, photophobia, pain, discharge and redness.  Respiratory: Negative for cough, hemoptysis, sputum production, shortness of breath, wheezing and stridor.   Cardiovascular: Negative for chest pain, palpitations,  orthopnea, claudication, leg swelling and PND.  Gastrointestinal: Negative for heartburn, nausea, vomiting, abdominal pain, diarrhea, constipation, blood in stool and melena.  Genitourinary: Negative.   Musculoskeletal: Positive for joint pain.       Recent knee replacement  Skin: Negative.   Neurological: Negative for dizziness, tingling, focal weakness, seizures, weakness and headaches.  Endo/Heme/Allergies: Does not bruise/bleed easily.  Psychiatric/Behavioral: Negative for depression. The patient is not nervous/anxious and does not have insomnia.     As per HPI. Otherwise, a complete review of systems is negatve.  ONCOLOGY HISTORY:  No history exists.    PAST MEDICAL HISTORY: Past Medical History  Diagnosis Date  . Anxiety   . Hypertension   . Hypothyroidism   . Diabetes mellitus without complication ([place])   . GERD (gastroesophageal reflux disease)   . Anemia   . Kidney cancer, primary, with metastasis from kidney to other site [facility]) 08/26/2014  . Colon cancer ([place])   . Thyroid cancer ([place])     PAST SURGICAL HISTORY: Past Surgical History  Procedure Laterality Date  . Colon resection    . Rt kidney removed Right   . Cholecystectomy    . Thyroid surgery Bilateral   . Total knee arthroplasty Right   . Total knee arthroplasty Left 08/09/2014    Procedure: TOTAL KNEE ARTHROPLASTY;  Surgeon: Dereck Leep, MD;  Location: [facility] ORS;  Service: Orthopedics;  Laterality: Left;    FAMILY HISTORY Family History  Problem Relation Age of Onset  . Breast cancer Maternal Aunt   . Breast cancer Cousin     GYNECOLOGIC HISTORY:  No LMP recorded. Patient is postmenopausal.     ADVANCED DIRECTIVES:    HEALTH MAINTENANCE: Social History  Substance Use Topics  . Smoking status: Former Smoker    Quit date: 07/29/1994  . Smokeless tobacco: None  . Alcohol Use: No       No Known Allergies  Current Outpatient Prescriptions  Medication Sig Dispense Refill  . ALPRAZolam  (XANAX) 0.5 MG tablet Take 0.5 mg by mouth 2 (two) times daily as needed for anxiety.    Marland Kitchen amLODipine-olmesartan (AZOR) 5-20 MG per tablet Take 1 tablet by mouth daily.    Marland Kitchen amLODipine-olmesartan (AZOR) 5-20 MG tablet Take 1 tablet by mouth once daily    . calcitRIOL (ROCALTROL) 0.5 MCG capsule Take 0.5 mcg by mouth daily.    . calcium acetate (PHOSLO) 667 MG capsule Take by mouth 2 (two) times daily.    . Cholecalciferol (VITAMIN D-3) 1000 UNITS CAPS Take 1 capsule by mouth daily.    . citalopram (CELEXA) 40 MG tablet Take 1 tablet by mouth  daily 90 tablet 3  . Fe Fum-FePoly-FA-Vit C-Vit B3 (FOLIVANE-F) 125-1 MG CAPS Take 1 capsule by mouth 2 (two) times daily.    Marland Kitchen glipiZIDE (GLUCOTROL) 10 MG tablet Take 10 mg by mouth 2 (two) times daily before a meal.    . levothyroxine (SYNTHROID, LEVOTHROID) 150 MCG tablet Take 150 mcg by mouth daily before breakfast.    . metFORMIN (GLUCOPHAGE) 1000 MG tablet Take 1,000 mg by mouth 2 (two) times daily with a meal.    . nebivolol (BYSTOLIC) 5 MG tablet Take 5 mg by mouth daily.    . nebivolol (BYSTOLIC) 5 MG tablet Take 1 tablet by mouth once daily    . oxyCODONE (OXY IR/ROXICODONE) 5 MG immediate release tablet Take 1-2 tablets (5-10 mg total) by mouth every 3 (three) hours as needed for breakthrough pain. 80 tablet 0  . pantoprazole (PROTONIX) 40 MG tablet Take 40 mg by mouth daily.    . pioglitazone (ACTOS) 30 MG tablet Take 30 mg by mouth daily.    . sitaGLIPtin (JANUVIA) 100 MG tablet Take 100 mg by mouth daily.    . traMADol (ULTRAM) 50 MG tablet Take 1-2 tablets (50-100 mg total) by mouth every 4 (four) hours as needed for moderate pain. 60 tablet 1  . vitamin B-12 (CYANOCOBALAMIN) 1000 MCG tablet Take 1,000 mcg by mouth daily.    Marland Kitchen enoxaparin (LOVENOX) 40 MG/0.4ML injection Inject 0.4 mLs (40 mg total) into the skin daily. 14 Syringe 0   No current facility-administered medications for this visit.    OBJECTIVE: BP 179/72 mmHg  Pulse 78   Temp(Src) 96.4 F (35.8 C) (Tympanic)  Wt 203 lb 4.2 oz (92.2 kg)   Body mass index is 32.82 kg/(m^2).    ECOG FS:1 - Symptomatic but completely ambulatory  General: Well-developed, well-nourished, no acute distress. Eyes: Pink conjunctiva, anicteric sclera. HEENT: Normocephalic, moist mucous membranes, clear oropharnyx. Some edema collected in neck. Lungs: Clear to auscultation bilaterally. Heart: Regular rate and rhythm. No rubs, murmurs, or gallops. Abdomen: Soft, nontender, nondistended. No organomegaly noted, normoactive bowel sounds. Musculoskeletal: No edema, cyanosis, or clubbing. Recent left knee replacement Neuro: Alert, answering all questions appropriately. Cranial nerves grossly intact. Skin: No rashes or petechiae noted. Psych: Normal affect. Lymphatics: No cervical, calvicular, axillary or inguinal LAD.   LAB RESULTS:  Appointment on 03/16/2015  Component Date Value Ref Range Status  . WBC 03/16/2015 8.4  3.6 - 11.0 K/uL Final  . RBC 03/16/2015 3.75* 3.80 - 5.20 MIL/uL Final  . Hemoglobin 03/16/2015 9.7* 12.0 - 16.0 g/dL Final  .  HCT 03/16/2015 30.2* 35.0 - 47.0 % Final  . MCV 03/16/2015 80.7  80.0 - 100.0 fL Final  . MCH 03/16/2015 25.8* 26.0 - 34.0 pg Final  . MCHC 03/16/2015 31.9* 32.0 - 36.0 g/dL Final  . RDW 03/16/2015 17.0* 11.5 - 14.5 % Final  . Platelets 03/16/2015 229  150 - 440 K/uL Final  . Neutrophils Relative % 03/16/2015 78   Final  . Neutro Abs 03/16/2015 6.6* 1.4 - 6.5 K/uL Final  . Lymphocytes Relative 03/16/2015 14   Final  . Lymphs Abs 03/16/2015 1.2  1.0 - 3.6 K/uL Final  . Monocytes Relative 03/16/2015 5   Final  . Monocytes Absolute 03/16/2015 0.4  0.2 - 0.9 K/uL Final  . Eosinophils Relative 03/16/2015 2   Final  . Eosinophils Absolute 03/16/2015 0.2  0 - 0.7 K/uL Final  . Basophils Relative 03/16/2015 1   Final  . Basophils Absolute 03/16/2015 0.1  0 - 0.1 K/uL Final  . Sodium 03/16/2015 135  135 - 145 mmol/L Final  . Potassium  03/16/2015 4.7  3.5 - 5.1 mmol/L Final  . Chloride 03/16/2015 103  101 - 111 mmol/L Final  . CO2 03/16/2015 26  22 - 32 mmol/L Final  . Glucose, Bld 03/16/2015 151* 65 - 99 mg/dL Final  . BUN 03/16/2015 20  6 - 20 mg/dL Final  . Creatinine, Ser 03/16/2015 1.07* 0.44 - 1.00 mg/dL Final  . Calcium 03/16/2015 8.5* 8.9 - 10.3 mg/dL Final  . Total Protein 03/16/2015 7.2  6.5 - 8.1 g/dL Final  . Albumin 03/16/2015 3.9  3.5 - 5.0 g/dL Final  . AST 03/16/2015 19  15 - 41 U/L Final  . ALT 03/16/2015 13* 14 - 54 U/L Final  . Alkaline Phosphatase 03/16/2015 59  38 - 126 U/L Final  . Total Bilirubin 03/16/2015 0.4  0.3 - 1.2 mg/dL Final  . GFR calc non Af Amer 03/16/2015 51* >60 mL/min Final  . GFR calc Af Amer 03/16/2015 59* >60 mL/min Final   Comment: (NOTE) The eGFR has been calculated using the CKD EPI equation. This calculation has not been validated in all clinical situations. eGFR's persistently <60 mL/min signify possible Chronic Kidney Disease.   . Anion gap 03/16/2015 6  5 - 15 Final    STUDIES: No results found.  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  PLAN:   1. Colon CA. Clinically there is no evidence of disease. Colonoscopy was performed in May 2014 and was reported as negative. Colonoscopy is being planned for 2017. Last CEA in June was 2.2 when one will be repeated in February 3 rd at Dr. Stacie Glaze office  Screening mammogram has been   birad-1  was done in July of 2016 2. Kidney CA, clear cell. Following recent thyroidectomy, patient was found to have metastasis from the kidney to the thyroid. Patient had a PET scan in January 2016 that did not reveal any other evidence of metastatic disease. Patient is now considered stage IV for renal cell CA but with no other evidence of disease at this time.  Discussed with patient and  patient's daughter their concerns regarding financial issues. Specifically the high cost of medications. [place] with care management for referral. He will contact patient's daughter directly to see if there is any assistance that he may offer. We'll continue with follow-up in 6  months. Repeat PET scan  In next few for complete omplete restaging  Patient expressed understanding and was in agreement with this plan. She also understands that She can call clinic at any time with any questions, concerns, or complaints.   Dr. Oliva Bustard was available for consultation and review of plan of care for this patient.   Forest Gleason, MD   03/16/2015 10:05 AM

## 2015-04-04 ENCOUNTER — Ambulatory Visit
Admission: RE | Admit: 2015-04-04 | Discharge: 2015-04-04 | Disposition: A | Discharge status: Home / Self Care | Payer: Medicare Other | Source: Ambulatory Visit | Attending: Oncology | Admitting: Oncology

## 2015-04-04 DIAGNOSIS — M5136 Other intervertebral disc degeneration, lumbar region: Secondary | ICD-10-CM | POA: Diagnosis not present

## 2015-04-04 DIAGNOSIS — R911 Solitary pulmonary nodule: Secondary | ICD-10-CM | POA: Diagnosis not present

## 2015-04-04 DIAGNOSIS — I2510 Atherosclerotic heart disease of native coronary artery without angina pectoris: Secondary | ICD-10-CM | POA: Insufficient documentation

## 2015-04-04 DIAGNOSIS — K7460 Unspecified cirrhosis of liver: Secondary | ICD-10-CM | POA: Insufficient documentation

## 2015-04-04 DIAGNOSIS — E890 Postprocedural hypothyroidism: Secondary | ICD-10-CM | POA: Insufficient documentation

## 2015-04-04 DIAGNOSIS — M47816 Spondylosis without myelopathy or radiculopathy, lumbar region: Secondary | ICD-10-CM | POA: Insufficient documentation

## 2015-04-04 DIAGNOSIS — I517 Cardiomegaly: Secondary | ICD-10-CM | POA: Insufficient documentation

## 2015-04-04 DIAGNOSIS — C7989 Secondary malignant neoplasm of other specified sites: Secondary | ICD-10-CM | POA: Diagnosis not present

## 2015-04-04 DIAGNOSIS — J8410 Pulmonary fibrosis, unspecified: Secondary | ICD-10-CM | POA: Diagnosis not present

## 2015-04-04 DIAGNOSIS — I700 Atherosclerosis of aorta: Secondary | ICD-10-CM | POA: Insufficient documentation

## 2015-04-04 DIAGNOSIS — C189 Malignant neoplasm of colon, unspecified: Secondary | ICD-10-CM | POA: Diagnosis not present

## 2015-04-04 DIAGNOSIS — N281 Cyst of kidney, acquired: Secondary | ICD-10-CM | POA: Insufficient documentation

## 2015-04-04 DIAGNOSIS — Z905 Acquired absence of kidney: Secondary | ICD-10-CM | POA: Insufficient documentation

## 2015-04-04 DIAGNOSIS — C649 Malignant neoplasm of unspecified kidney, except renal pelvis: Secondary | ICD-10-CM

## 2015-04-04 LAB — GLUCOSE, CAPILLARY: Glucose-Capillary: 180 mg/dL — ABNORMAL HIGH (ref 65–99)

## 2015-04-04 MED ORDER — FLUDEOXYGLUCOSE F - 18 (FDG) INJECTION
12.4100 | Freq: Once | INTRAVENOUS | Status: AC | PRN
  Administered 2015-04-04: 12.41 via INTRAVENOUS

## 2015-04-08 ENCOUNTER — Other Ambulatory Visit: Payer: Self-pay | Admitting: Internal Medicine

## 2015-04-08 DIAGNOSIS — R932 Abnormal findings on diagnostic imaging of liver and biliary tract: Secondary | ICD-10-CM

## 2015-04-12 ENCOUNTER — Ambulatory Visit
Admission: RE | Admit: 2015-04-12 | Discharge: 2015-04-12 | Disposition: A | Discharge status: Home / Self Care | Payer: Medicare Other | Source: Ambulatory Visit | Attending: Internal Medicine | Admitting: Internal Medicine

## 2015-04-12 DIAGNOSIS — R932 Abnormal findings on diagnostic imaging of liver and biliary tract: Secondary | ICD-10-CM

## 2015-04-12 DIAGNOSIS — Z9049 Acquired absence of other specified parts of digestive tract: Secondary | ICD-10-CM | POA: Insufficient documentation

## 2015-04-12 DIAGNOSIS — R945 Abnormal results of liver function studies: Secondary | ICD-10-CM | POA: Diagnosis present

## 2015-04-12 DIAGNOSIS — K7460 Unspecified cirrhosis of liver: Secondary | ICD-10-CM | POA: Insufficient documentation

## 2015-07-06 ENCOUNTER — Other Ambulatory Visit: Payer: Self-pay | Admitting: *Deleted

## 2015-07-06 DIAGNOSIS — D638 Anemia in other chronic diseases classified elsewhere: Secondary | ICD-10-CM

## 2015-07-07 ENCOUNTER — Inpatient Hospital Stay (HOSPITAL_BASED_OUTPATIENT_CLINIC_OR_DEPARTMENT_OTHER): Payer: Medicare Other | Admitting: Oncology

## 2015-07-07 ENCOUNTER — Inpatient Hospital Stay: Payer: Medicare Other | Attending: Oncology

## 2015-07-07 ENCOUNTER — Encounter: Payer: Self-pay | Admitting: Oncology

## 2015-07-07 VITALS — BP 131/64 | HR 76 | Temp 97.1°F | Resp 18 | Wt 211.0 lb

## 2015-07-07 DIAGNOSIS — K219 Gastro-esophageal reflux disease without esophagitis: Secondary | ICD-10-CM | POA: Insufficient documentation

## 2015-07-07 DIAGNOSIS — E890 Postprocedural hypothyroidism: Secondary | ICD-10-CM

## 2015-07-07 DIAGNOSIS — M2550 Pain in unspecified joint: Secondary | ICD-10-CM | POA: Diagnosis not present

## 2015-07-07 DIAGNOSIS — I10 Essential (primary) hypertension: Secondary | ICD-10-CM | POA: Insufficient documentation

## 2015-07-07 DIAGNOSIS — Z803 Family history of malignant neoplasm of breast: Secondary | ICD-10-CM

## 2015-07-07 DIAGNOSIS — Z85038 Personal history of other malignant neoplasm of large intestine: Secondary | ICD-10-CM | POA: Diagnosis not present

## 2015-07-07 DIAGNOSIS — Z7984 Long term (current) use of oral hypoglycemic drugs: Secondary | ICD-10-CM

## 2015-07-07 DIAGNOSIS — R110 Nausea: Secondary | ICD-10-CM | POA: Diagnosis not present

## 2015-07-07 DIAGNOSIS — Z85528 Personal history of other malignant neoplasm of kidney: Secondary | ICD-10-CM

## 2015-07-07 DIAGNOSIS — E119 Type 2 diabetes mellitus without complications: Secondary | ICD-10-CM | POA: Diagnosis not present

## 2015-07-07 DIAGNOSIS — R221 Localized swelling, mass and lump, neck: Secondary | ICD-10-CM | POA: Diagnosis not present

## 2015-07-07 DIAGNOSIS — Z87891 Personal history of nicotine dependence: Secondary | ICD-10-CM

## 2015-07-07 DIAGNOSIS — D649 Anemia, unspecified: Secondary | ICD-10-CM

## 2015-07-07 DIAGNOSIS — D638 Anemia in other chronic diseases classified elsewhere: Secondary | ICD-10-CM

## 2015-07-07 DIAGNOSIS — F419 Anxiety disorder, unspecified: Secondary | ICD-10-CM | POA: Diagnosis not present

## 2015-07-07 DIAGNOSIS — R0602 Shortness of breath: Secondary | ICD-10-CM | POA: Diagnosis not present

## 2015-07-07 DIAGNOSIS — D509 Iron deficiency anemia, unspecified: Secondary | ICD-10-CM | POA: Insufficient documentation

## 2015-07-07 LAB — CBC WITH DIFFERENTIAL/PLATELET
BASOS ABS: 0.1 10*3/uL (ref 0–0.1)
BASOS PCT: 1 %
EOS ABS: 0.1 10*3/uL (ref 0–0.7)
Eosinophils Relative: 1 %
HCT: 23.7 % — ABNORMAL LOW (ref 35.0–47.0)
Hemoglobin: 7.7 g/dL — ABNORMAL LOW (ref 12.0–16.0)
Lymphocytes Relative: 16 %
Lymphs Abs: 1.7 10*3/uL (ref 1.0–3.6)
MCH: 25.7 pg — ABNORMAL LOW (ref 26.0–34.0)
MCHC: 32.6 g/dL (ref 32.0–36.0)
MCV: 79.1 fL — ABNORMAL LOW (ref 80.0–100.0)
MONOS PCT: 5 %
Monocytes Absolute: 0.5 10*3/uL (ref 0.2–0.9)
NEUTROS ABS: 7.7 10*3/uL — AB (ref 1.4–6.5)
NEUTROS PCT: 77 %
PLATELETS: 239 10*3/uL (ref 150–440)
RBC: 3 MIL/uL — AB (ref 3.80–5.20)
RDW: 15.6 % — AB (ref 11.5–14.5)
WBC: 10.1 10*3/uL (ref 3.6–11.0)

## 2015-07-07 LAB — SAMPLE TO BLOOD BANK

## 2015-07-07 LAB — FERRITIN: Ferritin: 7 ng/mL — ABNORMAL LOW (ref 11–307)

## 2015-07-07 LAB — IRON AND TIBC
Iron: 35 ug/dL (ref 28–170)
SATURATION RATIOS: 7 % — AB (ref 10.4–31.8)
TIBC: 526 ug/dL — ABNORMAL HIGH (ref 250–450)
UIBC: 491 ug/dL

## 2015-07-07 LAB — PREPARE RBC (CROSSMATCH)

## 2015-07-07 NOTE — Progress Notes (Signed)
Patient states she has some SOB when walking around even on level ground.  States she has episodes when she eats that suddenly she has to move her bowels.  States she is very tired.  Patient accompanied by her daughter for this visit.  Having nausea in the morning.

## 2015-07-08 ENCOUNTER — Inpatient Hospital Stay: Payer: Medicare Other

## 2015-07-08 VITALS — BP 115/68 | HR 73 | Temp 97.4°F

## 2015-07-08 DIAGNOSIS — D509 Iron deficiency anemia, unspecified: Secondary | ICD-10-CM | POA: Diagnosis not present

## 2015-07-08 DIAGNOSIS — D638 Anemia in other chronic diseases classified elsewhere: Secondary | ICD-10-CM

## 2015-07-08 DIAGNOSIS — D649 Anemia, unspecified: Secondary | ICD-10-CM

## 2015-07-08 MED ORDER — ACETAMINOPHEN 325 MG PO TABS
650.0000 mg | ORAL_TABLET | Freq: Once | ORAL | Status: AC
  Administered 2015-07-08: 650 mg via ORAL
  Filled 2015-07-08: qty 2

## 2015-07-08 MED ORDER — SODIUM CHLORIDE 0.9 % IV SOLN
510.0000 mg | Freq: Once | INTRAVENOUS | Status: AC
  Administered 2015-07-08: 510 mg via INTRAVENOUS
  Filled 2015-07-08: qty 17

## 2015-07-08 MED ORDER — DIPHENHYDRAMINE HCL 25 MG PO CAPS
25.0000 mg | ORAL_CAPSULE | Freq: Once | ORAL | Status: AC
  Administered 2015-07-08: 25 mg via ORAL
  Filled 2015-07-08: qty 1

## 2015-07-08 MED ORDER — SODIUM CHLORIDE 0.9 % IV SOLN
250.0000 mL | Freq: Once | INTRAVENOUS | Status: AC
  Administered 2015-07-08: 250 mL via INTRAVENOUS
  Filled 2015-07-08: qty 250

## 2015-07-08 MED ORDER — SODIUM CHLORIDE 0.9 % IV SOLN
Freq: Once | INTRAVENOUS | Status: AC
  Administered 2015-07-08: 10:00:00 via INTRAVENOUS
  Filled 2015-07-08: qty 1000

## 2015-07-09 ENCOUNTER — Encounter: Payer: Self-pay | Admitting: Oncology

## 2015-07-09 LAB — TYPE AND SCREEN
ABO/RH(D): A POS
ANTIBODY SCREEN: NEGATIVE
Unit division: 0

## 2015-07-09 NOTE — Progress Notes (Signed)
[place]  Telephone:(336) [phone]  Fax:([phone]) [place] DOB: 06/10/1945  MR#: [id number]  [id number]  Patient Care Team: Idelle Crouch, MD as PCP - General (Internal Medicine)  CHIEF COMPLAINT:  Chief Complaint  Patient presents with  . Colon Cancer   Carcinoma of sigmoid colon, status post colectomy. 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 T3 a NXM0 Status post thyroidectomy for enlarged thyroid gland in December 2015. No evidence of thyroid cancer but suspected metastatic disease from kidney cancer.  INTERVAL HISTORY:  Patient is here for continued evaluation and treatment consideration regarding a history of colon CA, clear cell carcinoma of the kidney, as well as recent thyroidectomy due to enlarging thyroid gland. On pathology there was no evidence of primary thyroid cancer however there was an area of concern suggesting metastasis from the kidney cancer. Patient continues to have some swelling in the neck area. She has recently had her left knee replaced and is working with physical therapy. She otherwise denies any complaints other than the cost of medications. Patient is scheduled for colonoscopy next year. Mammogram in June of 2016.  Last PET scan was done in January of 2016 Patient is here for further follow-up and treatment consideration No chills fever.      Patient states she has some SOB when walking around even on level ground. States she has episodes when she eats that suddenly she has to move her bowels. States she is very tired. Patient accompanied by her daughter for this visit. Having nausea in the morning.   Patient denies any history of black stool.  Hemoccult test which was done in primary care doctor's office was negative for any  OCCULT BLOOD .  Patient does not take any aspirin or any blood thinning medication.  Hemoglobin is dropped to 7 g recently. Here for  further follow-up and treatment consideration     REVIEW OF SYSTEMS:   Review of Systems  Constitutional: Negative for fever, chills, weight loss, malaise/fatigue and diaphoresis.  HENT: Negative for congestion, ear discharge, ear pain, hearing loss, nosebleeds, sore throat and tinnitus.   Eyes: Negative for blurred vision, double vision, photophobia, pain, discharge and redness.  Respiratory: Negative for cough, hemoptysis, sputum production, shortness of breath, wheezing and stridor.   Cardiovascular: Negative for chest pain, palpitations, orthopnea, claudication, leg swelling and PND.  Gastrointestinal: Negative for heartburn, nausea, vomiting, abdominal pain, diarrhea, constipation, blood in stool and melena.  Genitourinary: Negative.   Musculoskeletal: Positive for joint pain.       Recent knee replacement  Skin: Negative.   Neurological: Negative for dizziness, tingling, focal weakness, seizures, weakness and headaches.  Endo/Heme/Allergies: Does not bruise/bleed easily.  Psychiatric/Behavioral: Negative for depression. The patient is not nervous/anxious and does not have insomnia.     As per HPI. Otherwise, a complete review of systems is negatve.  ONCOLOGY HISTORY:  No history exists.    PAST MEDICAL HISTORY: Past Medical History  Diagnosis Date  . Anxiety   . Hypertension   . Hypothyroidism   . Diabetes mellitus without complication ([place])   . GERD (gastroesophageal reflux disease)   . Anemia   . 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 Colon cancer ([place])   . Thyroid cancer ([place])     PAST SURGICAL HISTORY: Past Surgical History  Procedure Laterality Date  . Colon resection    .  Rt kidney removed Right   . Cholecystectomy    . Thyroid surgery Bilateral   . Total knee arthroplasty Right   . Total knee arthroplasty Left 08/09/2014    Procedure: TOTAL KNEE ARTHROPLASTY;  Surgeon: Dereck Leep, MD;  Location: [facility] ORS;   Service: Orthopedics;  Laterality: Left;    FAMILY HISTORY Family History  Problem Relation Age of Onset  . Breast cancer Maternal Aunt   . Breast cancer Cousin     GYNECOLOGIC HISTORY:  No LMP recorded. Patient is postmenopausal.     ADVANCED DIRECTIVES:    HEALTH MAINTENANCE: Social History  Substance Use Topics  . Smoking status: Former Smoker    Quit date: 07/29/1994  . Smokeless tobacco: None  . Alcohol Use: No       No Known Allergies  Current Outpatient Prescriptions  Medication Sig Dispense Refill  . amLODipine-olmesartan (AZOR) 5-20 MG per tablet Take 1 tablet by mouth daily.    . calcium acetate (PHOSLO) 667 MG capsule Take by mouth 2 (two) times daily.    . Cholecalciferol (VITAMIN D-3) 1000 UNITS CAPS Take 1 capsule by mouth daily.    . citalopram (CELEXA) 40 MG tablet Take 1 tablet by mouth  daily 90 tablet 3  . Fe Fum-FePoly-FA-Vit C-Vit B3 (FOLIVANE-F) 125-1 MG CAPS Take 1 capsule by mouth 2 (two) times daily.    Marland Kitchen glipiZIDE (GLUCOTROL) 10 MG tablet Take 10 mg by mouth 2 (two) times daily before a meal.    . HYDROcodone-acetaminophen (NORCO) 10-325 MG tablet Take by mouth.    . levothyroxine (SYNTHROID, LEVOTHROID) 150 MCG tablet Take 150 mcg by mouth daily before breakfast.    . metFORMIN (GLUCOPHAGE) 1000 MG tablet Take 1,000 mg by mouth 2 (two) times daily with a meal.    . nebivolol (BYSTOLIC) 5 MG tablet Take 5 mg by mouth daily.    . pantoprazole (PROTONIX) 40 MG tablet Take 40 mg by mouth daily.    . pioglitazone (ACTOS) 30 MG tablet Take 30 mg by mouth daily.    . sitaGLIPtin (JANUVIA) 100 MG tablet Take 100 mg by mouth daily.    Marland Kitchen enoxaparin (LOVENOX) 40 MG/0.4ML injection Inject 0.4 mLs (40 mg total) into the skin daily. 14 Syringe 0  . nebivolol (BYSTOLIC) 5 MG tablet Reported on 07/07/2015    . vitamin B-12 (CYANOCOBALAMIN) 1000 MCG tablet Take 1,000 mcg by mouth daily. Reported on 07/07/2015     No current facility-administered medications  for this visit.    OBJECTIVE: BP 131/64 mmHg  Pulse 76  Temp(Src) 97.1 F (36.2 C) (Tympanic)  Resp 18  Wt 210 lb 15.7 oz (95.7 kg)   Body mass index is 34.07 kg/(m^2).    ECOG FS:1 - Symptomatic but completely ambulatory  General: Well-developed, well-nourished, no acute distress. Eyes: Pink conjunctiva, anicteric sclera. HEENT: Normocephalic, moist mucous membranes, clear oropharnyx. Some edema collected in neck. Lungs: Clear to auscultation bilaterally. Heart: Regular rate and rhythm. No rubs, murmurs, or gallops. Abdomen: Soft, nontender, nondistended. No organomegaly noted, normoactive bowel sounds. Musculoskeletal: No edema, cyanosis, or clubbing. Recent left knee replacement Neuro: Alert, answering all questions appropriately. Cranial nerves grossly intact. Skin: No rashes or petechiae noted. Psych: Normal affect. Lymphatics: No cervical, calvicular, axillary or inguinal LAD.   LAB RESULTS:  Office Visit on 07/07/2015  Component Date Value Ref Range Status  . Order Confirmation 07/07/2015 ORDER PROCESSED BY BLOOD BANK   Final  . ABO/RH(D) 07/07/2015 A POS   Final  .  Antibody Screen 07/07/2015 NEG   Final  . Sample Expiration 07/07/2015 07/10/2015   Final  . Unit Number 07/07/2015 [id number]   Final  . Blood Component Type 07/07/2015 RED CELLS,LR   Final  . Unit division 07/07/2015 00   Final  . Status of Unit 07/07/2015 ISSUED   Final  . Transfusion Status 07/07/2015 OK TO TRANSFUSE   Final  . Crossmatch Result 07/07/2015 Compatible   Final  Appointment on 07/07/2015  Component Date Value Ref Range Status  . WBC 07/07/2015 10.1  3.6 - 11.0 K/uL Final  . RBC 07/07/2015 3.00* 3.80 - 5.20 MIL/uL Final  . Hemoglobin 07/07/2015 7.7* 12.0 - 16.0 g/dL Final  . HCT 07/07/2015 23.7* 35.0 - 47.0 % Final  . MCV 07/07/2015 79.1* 80.0 - 100.0 fL Final  . MCH 07/07/2015 25.7* 26.0 - 34.0 pg Final  . MCHC 07/07/2015 32.6  32.0 - 36.0 g/dL Final  . RDW 07/07/2015 15.6* 11.5 -  14.5 % Final  . Platelets 07/07/2015 239  150 - 440 K/uL Final  . Neutrophils Relative % 07/07/2015 77   Final  . Neutro Abs 07/07/2015 7.7* 1.4 - 6.5 K/uL Final  . Lymphocytes Relative 07/07/2015 16   Final  . Lymphs Abs 07/07/2015 1.7  1.0 - 3.6 K/uL Final  . Monocytes Relative 07/07/2015 5   Final  . Monocytes Absolute 07/07/2015 0.5  0.2 - 0.9 K/uL Final  . Eosinophils Relative 07/07/2015 1   Final  . Eosinophils Absolute 07/07/2015 0.1  0 - 0.7 K/uL Final  . Basophils Relative 07/07/2015 1   Final  . Basophils Absolute 07/07/2015 0.1  0 - 0.1 K/uL Final  . Blood Bank Specimen 07/07/2015 SAMPLE AVAILABLE FOR TESTING   Final  . Sample Expiration 07/07/2015 07/10/2015   Final  . Ferritin 07/07/2015 7* 11 - 307 ng/mL Final  . Iron 07/07/2015 35  28 - 170 ug/dL Final  . TIBC 07/07/2015 526* 250 - 450 ug/dL Final  . Saturation Ratios 07/07/2015 7* 10.4 - 31.8 % Final  . UIBC 07/07/2015 491   Final    STUDIES: No results found.  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  PLAN:   1. Colon CA. Clinically there is no evidence of disease. Colonoscopy was performed in May 2014 and was reported as negative. Colonoscopy is being planned for 2017. Last CEA in June was 2.2 when one will be repeated in February 3 rd at Dr. Stacie Glaze office  Screening mammogram has been   birad-1  was done in July of 2016 2. Kidney CA, clear cell. Following recent thyroidectomy, patient was found to have metastasis from the kidney to the thyroid. Patient had a PET scan in January 2016 that did not reveal any other evidence of metastatic disease. Patient is now considered stage IV for renal cell CA but with no other evidence of disease at this time.  2.  Anemia multifactorial recent episode appears to be iron deficiency Ferritin is low.  Occult blood is been  negative. Proceed with 1 unit of packed cells as patient is very symptomatic Intravenous iron tomorrow and repeat in 1 week Patient will need upper and lower endoscopy as well as Shows study for small intestinal evaluation if upper and lower endoscopies negative for any source of bleeding 3.  Considering previous history of carcinoma of kidney metastases to thyroid gland  patient may need another PET scan for restaging workup 4.  If anemia does not improve the possibility of bone marrow aspiration and biopsy would be planned to rule out any myelodysplastic syndrome This has all been discussed with the patient and family .      Forest Gleason, MD   07/09/2015 8:00 AM

## 2015-07-15 ENCOUNTER — Inpatient Hospital Stay: Payer: Medicare Other

## 2015-07-15 VITALS — BP 105/64 | HR 72 | Resp 20

## 2015-07-15 DIAGNOSIS — D509 Iron deficiency anemia, unspecified: Secondary | ICD-10-CM | POA: Diagnosis not present

## 2015-07-15 DIAGNOSIS — D638 Anemia in other chronic diseases classified elsewhere: Secondary | ICD-10-CM

## 2015-07-15 MED ORDER — SODIUM CHLORIDE 0.9 % IV SOLN
Freq: Once | INTRAVENOUS | Status: AC
Start: 2015-07-15 — End: 2015-07-15
  Administered 2015-07-15: 14:00:00 via INTRAVENOUS
  Filled 2015-07-15: qty 1000

## 2015-07-15 MED ORDER — FERUMOXYTOL INJECTION 510 MG/17 ML
510.0000 mg | Freq: Once | INTRAVENOUS | Status: AC
  Administered 2015-07-15: 510 mg via INTRAVENOUS
  Filled 2015-07-15: qty 17

## 2015-07-21 ENCOUNTER — Inpatient Hospital Stay (HOSPITAL_BASED_OUTPATIENT_CLINIC_OR_DEPARTMENT_OTHER): Payer: Medicare Other | Admitting: Oncology

## 2015-07-21 ENCOUNTER — Inpatient Hospital Stay: Payer: Medicare Other

## 2015-07-21 VITALS — BP 143/74 | HR 73 | Temp 96.4°F | Resp 18 | Wt 207.5 lb

## 2015-07-21 DIAGNOSIS — Z87891 Personal history of nicotine dependence: Secondary | ICD-10-CM

## 2015-07-21 DIAGNOSIS — M2550 Pain in unspecified joint: Secondary | ICD-10-CM

## 2015-07-21 DIAGNOSIS — Z803 Family history of malignant neoplasm of breast: Secondary | ICD-10-CM

## 2015-07-21 DIAGNOSIS — E890 Postprocedural hypothyroidism: Secondary | ICD-10-CM | POA: Diagnosis not present

## 2015-07-21 DIAGNOSIS — R221 Localized swelling, mass and lump, neck: Secondary | ICD-10-CM | POA: Diagnosis not present

## 2015-07-21 DIAGNOSIS — I10 Essential (primary) hypertension: Secondary | ICD-10-CM

## 2015-07-21 DIAGNOSIS — F419 Anxiety disorder, unspecified: Secondary | ICD-10-CM

## 2015-07-21 DIAGNOSIS — Z7984 Long term (current) use of oral hypoglycemic drugs: Secondary | ICD-10-CM

## 2015-07-21 DIAGNOSIS — R0602 Shortness of breath: Secondary | ICD-10-CM | POA: Diagnosis not present

## 2015-07-21 DIAGNOSIS — R110 Nausea: Secondary | ICD-10-CM

## 2015-07-21 DIAGNOSIS — D638 Anemia in other chronic diseases classified elsewhere: Secondary | ICD-10-CM

## 2015-07-21 DIAGNOSIS — D509 Iron deficiency anemia, unspecified: Secondary | ICD-10-CM | POA: Diagnosis not present

## 2015-07-21 DIAGNOSIS — Z85038 Personal history of other malignant neoplasm of large intestine: Secondary | ICD-10-CM

## 2015-07-21 DIAGNOSIS — K219 Gastro-esophageal reflux disease without esophagitis: Secondary | ICD-10-CM

## 2015-07-21 DIAGNOSIS — E119 Type 2 diabetes mellitus without complications: Secondary | ICD-10-CM

## 2015-07-21 DIAGNOSIS — Z85528 Personal history of other malignant neoplasm of kidney: Secondary | ICD-10-CM

## 2015-07-21 DIAGNOSIS — D649 Anemia, unspecified: Secondary | ICD-10-CM

## 2015-07-21 LAB — CBC WITH DIFFERENTIAL/PLATELET
BASOS PCT: 1 %
Basophils Absolute: 0.1 10*3/uL (ref 0–0.1)
EOS ABS: 0.1 10*3/uL (ref 0–0.7)
EOS PCT: 1 %
HCT: 32.1 % — ABNORMAL LOW (ref 35.0–47.0)
Hemoglobin: 10.4 g/dL — ABNORMAL LOW (ref 12.0–16.0)
LYMPHS ABS: 1 10*3/uL (ref 1.0–3.6)
Lymphocytes Relative: 10 %
MCH: 27.6 pg (ref 26.0–34.0)
MCHC: 32.5 g/dL (ref 32.0–36.0)
MCV: 84.9 fL (ref 80.0–100.0)
MONOS PCT: 5 %
Monocytes Absolute: 0.4 10*3/uL (ref 0.2–0.9)
NEUTROS PCT: 83 %
Neutro Abs: 7.9 10*3/uL — ABNORMAL HIGH (ref 1.4–6.5)
PLATELETS: 185 10*3/uL (ref 150–440)
RBC: 3.78 MIL/uL — ABNORMAL LOW (ref 3.80–5.20)
RDW: 20.6 % — AB (ref 11.5–14.5)
WBC: 9.5 10*3/uL (ref 3.6–11.0)

## 2015-07-21 LAB — SAMPLE TO BLOOD BANK

## 2015-07-21 NOTE — Progress Notes (Signed)
[place]  Telephone:(336) [phone]  Fax:(336) [place] DOB: September 28, 1945  MR#: [id number]  [id number]  Patient Care Team: Idelle Crouch, MD as PCP - General (Internal Medicine)  CHIEF COMPLAINT:  Chief Complaint  Patient presents with  . Kidney with mets to colon   Carcinoma of sigmoid colon, status post colectomy. 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 T3 a NXM0 Status post thyroidectomy for enlarged thyroid gland in December 2015. No evidence of thyroid cancer but suspected metastatic disease from kidney cancer.  INTERVAL HISTORY:  Patient is here for continued evaluation and treatment consideration regarding a history of colon CA, clear cell carcinoma of the kidney, as well as recent thyroidectomy due to enlarging thyroid gland. On pathology there was no evidence of primary thyroid cancer however there was an area of concern suggesting metastasis from the kidney cancer. Patient continues to have some swelling in the neck area. She has recently had her left knee replaced and is working with physical therapy. She otherwise denies any complaints other than the cost of medications. Patient is scheduled for colonoscopy next year. Mammogram in June of 2016.  Last PET scan was done in January of 2016 Patient is here for further follow-up and treatment consideration No chills fever.      Patient received 1 unit of packed cells and 2 intravenous iron therapy feeling significantly stronger.  Hemoglobin is improved to 10 g. Here for further follow-up and treatment consideration Iron studies were consistent with iron deficiency anemia     REVIEW OF SYSTEMS:   Review of Systems  Constitutional: Negative for fever, chills, weight loss, malaise/fatigue and diaphoresis.  HENT: Negative for congestion, ear discharge, ear pain, hearing loss, nosebleeds, sore throat and tinnitus.   Eyes: Negative  for blurred vision, double vision, photophobia, pain, discharge and redness.  Respiratory: Negative for cough, hemoptysis, sputum production, shortness of breath, wheezing and stridor.   Cardiovascular: Negative for chest pain, palpitations, orthopnea, claudication, leg swelling and PND.  Gastrointestinal: Negative for heartburn, nausea, vomiting, abdominal pain, diarrhea, constipation, blood in stool and melena.  Genitourinary: Negative.   Musculoskeletal: Positive for joint pain.       Recent knee replacement  Skin: Negative.   Neurological: Negative for dizziness, tingling, focal weakness, seizures, weakness and headaches.  Endo/Heme/Allergies: Does not bruise/bleed easily.  Psychiatric/Behavioral: Negative for depression. The patient is not nervous/anxious and does not have insomnia.     As per HPI. Otherwise, a complete review of systems is negatve.  ONCOLOGY HISTORY:  No history exists.    PAST MEDICAL HISTORY: Past Medical History  Diagnosis Date  . Anxiety   . Hypertension   . Hypothyroidism   . Diabetes mellitus without complication ([place])   . GERD (gastroesophageal reflux disease)   . Anemia   . Kidney cancer, primary, with metastasis from kidney to other site [facility]) 08/26/2014  . Colon cancer ([place])   . Thyroid cancer ([place])     PAST SURGICAL HISTORY: Past Surgical History  Procedure Laterality Date  . Colon resection    . Rt kidney removed Right   . Cholecystectomy    . Thyroid surgery Bilateral   . Total knee arthroplasty Right   . Total knee arthroplasty Left 08/09/2014    Procedure: TOTAL KNEE ARTHROPLASTY;  Surgeon: Dereck Leep, MD;  Location: [facility] ORS;  Service: Orthopedics;  Laterality: Left;    FAMILY HISTORY Family History  Problem Relation Age of Onset  . Breast cancer Maternal Aunt   . Breast cancer Cousin     GYNECOLOGIC HISTORY:  No LMP recorded. Patient is postmenopausal.     ADVANCED DIRECTIVES:    HEALTH MAINTENANCE: Social History    Substance Use Topics  . Smoking status: Former Smoker    Quit date: 07/29/1994  . Smokeless tobacco: Not on file  . Alcohol Use: No       No Known Allergies  Current Outpatient Prescriptions  Medication Sig Dispense Refill  . ALPRAZolam (XANAX) 0.5 MG tablet Take by mouth.    Marland Kitchen amLODipine-olmesartan (AZOR) 5-20 MG per tablet Take 1 tablet by mouth daily.    . calcium acetate (PHOSLO) 667 MG capsule Take by mouth 2 (two) times daily.    . Cholecalciferol (VITAMIN D-3) 1000 UNITS CAPS Take 1 capsule by mouth daily.    . citalopram (CELEXA) 40 MG tablet Take 1 tablet by mouth  daily 90 tablet 3  . Fe Fum-FePoly-FA-Vit C-Vit B3 (FOLIVANE-F) 125-1 MG CAPS Take 1 capsule by mouth 2 (two) times daily.    Marland Kitchen glipiZIDE (GLUCOTROL) 10 MG tablet Take 10 mg by mouth 2 (two) times daily before a meal.    . HYDROcodone-acetaminophen (NORCO) 10-325 MG tablet Take by mouth.    . levothyroxine (SYNTHROID, LEVOTHROID) 150 MCG tablet Take 150 mcg by mouth daily before breakfast.    . metFORMIN (GLUCOPHAGE) 1000 MG tablet Take 1,000 mg by mouth 2 (two) times daily with a meal.    . nebivolol (BYSTOLIC) 5 MG tablet Take 5 mg by mouth daily.    . nebivolol (BYSTOLIC) 5 MG tablet Reported on 07/07/2015    . pantoprazole (PROTONIX) 40 MG tablet Take 40 mg by mouth daily.    . pioglitazone (ACTOS) 30 MG tablet Take 30 mg by mouth daily.    . sitaGLIPtin (JANUVIA) 100 MG tablet Take 100 mg by mouth daily.    . vitamin B-12 (CYANOCOBALAMIN) 1000 MCG tablet Take 1,000 mcg by mouth daily. Reported on 07/07/2015    . enoxaparin (LOVENOX) 40 MG/0.4ML injection Inject 0.4 mLs (40 mg total) into the skin daily. 14 Syringe 0   No current facility-administered medications for this visit.    OBJECTIVE: BP 143/74 mmHg  Pulse 73  Temp(Src) 96.4 F (35.8 C) (Tympanic)  Resp 18  Wt 207 lb 7.3 oz (94.1 kg)   Body mass index is 33.5 kg/(m^2).    ECOG FS:1 - Symptomatic but completely ambulatory  General:  Well-developed, well-nourished, no acute distress. Eyes: Pink conjunctiva, anicteric sclera. HEENT: Normocephalic, moist mucous membranes, clear oropharnyx. Some edema collected in neck. Lungs: Clear to auscultation bilaterally. Heart: Regular rate and rhythm. No rubs, murmurs, or gallops. Abdomen: Soft, nontender, nondistended. No organomegaly noted, normoactive bowel sounds. Musculoskeletal: No edema, cyanosis, or clubbing. Recent left knee replacement Neuro: Alert, answering all questions appropriately. Cranial nerves grossly intact. Skin: No rashes or petechiae noted. Psych: Normal affect. Lymphatics: No cervical, calvicular, axillary or inguinal LAD.   LAB RESULTS:  Appointment on 07/21/2015  Component Date Value Ref Range Status  . WBC 07/21/2015 9.5  3.6 - 11.0 K/uL Final  . RBC 07/21/2015 3.78* 3.80 - 5.20 MIL/uL Final  . Hemoglobin 07/21/2015 10.4* 12.0 - 16.0 g/dL Final  . HCT 07/21/2015 32.1* 35.0 - 47.0 % Final  . MCV 07/21/2015 84.9  80.0 - 100.0 fL Final  . MCH 07/21/2015 27.6  26.0 - 34.0 pg Final  . MCHC 07/21/2015 32.5  32.0 -  36.0 g/dL Final  . RDW 07/21/2015 20.6* 11.5 - 14.5 % Final  . Platelets 07/21/2015 185  150 - 440 K/uL Final  . Neutrophils Relative % 07/21/2015 83   Final  . Neutro Abs 07/21/2015 7.9* 1.4 - 6.5 K/uL Final  . Lymphocytes Relative 07/21/2015 10   Final  . Lymphs Abs 07/21/2015 1.0  1.0 - 3.6 K/uL Final  . Monocytes Relative 07/21/2015 5   Final  . Monocytes Absolute 07/21/2015 0.4  0.2 - 0.9 K/uL Final  . Eosinophils Relative 07/21/2015 1   Final  . Eosinophils Absolute 07/21/2015 0.1  0 - 0.7 K/uL Final  . Basophils Relative 07/21/2015 1   Final  . Basophils Absolute 07/21/2015 0.1  0 - 0.1 K/uL Final    STUDIES: No results found.  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  PLAN:     1. Colon CA. Clinically there is no evidence of disease. Colonoscopy was performed in May 2014 and was reported as negative.  workup Iron deficiency anemia.  After 1 unit of packed cell transfusion and 2 treatment of iron therapy patient's hemoglobin has been stabilized at 10.4 and patient symptomatically improved. I requested patient's daughter who is a nurse to arrange for upper and lower endoscopy in 2 study if indicated. Repeat hemoglobin and iron studies in one month if needed continue iron supplement oral as well as intravenous. Patient is due for mammogram in June of 2017 which is being arranged If hemoglobin continues to drop patient can have a bone marrow aspiration and biopsy to rule out any other causes.  Repeat PET scan has been scheduled in July of 2017 and patient to be evaluated by my associate.  She is aware of my retirement plan. Forest Gleason, MD   07/21/2015 11:05 AM

## 2015-07-23 ENCOUNTER — Encounter: Payer: Self-pay | Admitting: Oncology

## 2015-08-04 ENCOUNTER — Encounter: Payer: Self-pay | Admitting: Oncology

## 2015-08-04 ENCOUNTER — Inpatient Hospital Stay: Payer: Medicare Other | Attending: Oncology

## 2015-08-04 ENCOUNTER — Telehealth: Payer: Self-pay | Admitting: *Deleted

## 2015-08-04 ENCOUNTER — Inpatient Hospital Stay (HOSPITAL_BASED_OUTPATIENT_CLINIC_OR_DEPARTMENT_OTHER): Payer: Medicare Other | Admitting: Oncology

## 2015-08-04 VITALS — BP 137/73 | HR 74 | Temp 95.8°F | Resp 18 | Wt 208.1 lb

## 2015-08-04 DIAGNOSIS — Z7984 Long term (current) use of oral hypoglycemic drugs: Secondary | ICD-10-CM | POA: Diagnosis not present

## 2015-08-04 DIAGNOSIS — Z9049 Acquired absence of other specified parts of digestive tract: Secondary | ICD-10-CM | POA: Diagnosis not present

## 2015-08-04 DIAGNOSIS — F419 Anxiety disorder, unspecified: Secondary | ICD-10-CM | POA: Insufficient documentation

## 2015-08-04 DIAGNOSIS — K219 Gastro-esophageal reflux disease without esophagitis: Secondary | ICD-10-CM | POA: Diagnosis not present

## 2015-08-04 DIAGNOSIS — E890 Postprocedural hypothyroidism: Secondary | ICD-10-CM

## 2015-08-04 DIAGNOSIS — R042 Hemoptysis: Secondary | ICD-10-CM

## 2015-08-04 DIAGNOSIS — M2550 Pain in unspecified joint: Secondary | ICD-10-CM | POA: Insufficient documentation

## 2015-08-04 DIAGNOSIS — C649 Malignant neoplasm of unspecified kidney, except renal pelvis: Secondary | ICD-10-CM

## 2015-08-04 DIAGNOSIS — R05 Cough: Secondary | ICD-10-CM

## 2015-08-04 DIAGNOSIS — I10 Essential (primary) hypertension: Secondary | ICD-10-CM | POA: Insufficient documentation

## 2015-08-04 DIAGNOSIS — Z85528 Personal history of other malignant neoplasm of kidney: Secondary | ICD-10-CM | POA: Diagnosis not present

## 2015-08-04 DIAGNOSIS — D509 Iron deficiency anemia, unspecified: Secondary | ICD-10-CM

## 2015-08-04 DIAGNOSIS — R221 Localized swelling, mass and lump, neck: Secondary | ICD-10-CM

## 2015-08-04 DIAGNOSIS — Z85038 Personal history of other malignant neoplasm of large intestine: Secondary | ICD-10-CM

## 2015-08-04 DIAGNOSIS — Z905 Acquired absence of kidney: Secondary | ICD-10-CM

## 2015-08-04 DIAGNOSIS — Z803 Family history of malignant neoplasm of breast: Secondary | ICD-10-CM

## 2015-08-04 DIAGNOSIS — Z87891 Personal history of nicotine dependence: Secondary | ICD-10-CM

## 2015-08-04 DIAGNOSIS — D638 Anemia in other chronic diseases classified elsewhere: Secondary | ICD-10-CM

## 2015-08-04 DIAGNOSIS — E119 Type 2 diabetes mellitus without complications: Secondary | ICD-10-CM

## 2015-08-04 DIAGNOSIS — Z79899 Other long term (current) drug therapy: Secondary | ICD-10-CM | POA: Diagnosis not present

## 2015-08-04 LAB — IRON AND TIBC
IRON: 39 ug/dL (ref 28–170)
Saturation Ratios: 10 % — ABNORMAL LOW (ref 10.4–31.8)
TIBC: 375 ug/dL (ref 250–450)
UIBC: 336 ug/dL

## 2015-08-04 LAB — FERRITIN: Ferritin: 92 ng/mL (ref 11–307)

## 2015-08-04 LAB — HEMOGLOBIN: Hemoglobin: 11.1 g/dL — ABNORMAL LOW (ref 12.0–16.0)

## 2015-08-04 LAB — SAMPLE TO BLOOD BANK

## 2015-08-04 NOTE — Telephone Encounter (Signed)
Called to report that her mother casually mentioned that she is coughing up blood in the mornings. Also noted that she is choking more frequently on liquids or solids. She has not mentioned this to any of her providers and she has an appt this morning with Dr Oliva Bustard.

## 2015-08-04 NOTE — Telephone Encounter (Signed)
MD aware

## 2015-08-04 NOTE — Progress Notes (Signed)
[place]  Telephone:(336) [phone]  Fax:(336) [place] DOB: [birth year]  MR#: [id number]  [id number]  Patient Care Team: Idelle Crouch, MD as PCP - General (Internal Medicine)  CHIEF COMPLAINT:  Chief Complaint  Patient presents with  . Anemia in chronic illness   Carcinoma of sigmoid colon, status post colectomy. 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 T3 a NXM0 Status post thyroidectomy for enlarged thyroid gland in December 2015. No evidence of thyroid cancer but suspected metastatic disease from kidney cancer. 2.Iron deficiency anemia requiring blood transfusion and intravenous iron therapy (May of 2017) INTERVAL HISTORY:  Patient is here for continued evaluation and treatment consideration regarding a history of colon CA, clear cell carcinoma of the kidney, as well as recent thyroidectomy due to enlarging thyroid gland. On pathology there was no evidence of primary thyroid cancer however there was an area of concern suggesting metastasis from the kidney cancer. Patient continues to have some swelling in the neck area  According to patient's family the neck swelling has increased.  Patient has some cough and hemoptysis.  Feeling better hemoglobin is improved to 11.1.  Patient has upper and lower endoscopy scheduled on July 6    Feeling stronger.  Family is concerned about ongoing symptoms of swelling in the neck and hemoptysis.    REVIEW OF SYSTEMS:   Review of Systems  Constitutional: Negative for fever, chills, weight loss, malaise/fatigue and diaphoresis.  HENT: Negative for congestion, ear discharge, ear pain, hearing loss, nosebleeds, sore throat and tinnitus.   Eyes: Negative for blurred vision, double vision, photophobia, pain, discharge and redness.  Respiratory: Negative for cough, hemoptysis, sputum production, shortness of breath, wheezing and stridor.   Cardiovascular:  Negative for chest pain, palpitations, orthopnea, claudication, leg swelling and PND.  Gastrointestinal: Negative for heartburn, nausea, vomiting, abdominal pain, diarrhea, constipation, blood in stool and melena.  Genitourinary: Negative.   Musculoskeletal: Positive for joint pain.       Recent knee replacement  Skin: Negative.   Neurological: Negative for dizziness, tingling, focal weakness, seizures, weakness and headaches.  Endo/Heme/Allergies: Does not bruise/bleed easily.  Psychiatric/Behavioral: Negative for depression. The patient is not nervous/anxious and does not have insomnia.     As per HPI. Otherwise, a complete review of systems is negatve.  ONCOLOGY HISTORY:  No history exists.    PAST MEDICAL HISTORY: Past Medical History  Diagnosis Date  . Anxiety   . Hypertension   . Hypothyroidism   . Diabetes mellitus without complication ([place])   . GERD (gastroesophageal reflux disease)   . Anemia   . Kidney cancer, primary, with metastasis from kidney to other site [facility]) 08/26/2014  . Colon cancer ([place])   . Thyroid cancer ([place])     PAST SURGICAL HISTORY: Past Surgical History  Procedure Laterality Date  . Colon resection    . Rt kidney removed Right   . Cholecystectomy    . Thyroid surgery Bilateral   . Total knee arthroplasty Right   . Total knee arthroplasty Left 08/09/2014    Procedure: TOTAL KNEE ARTHROPLASTY;  Surgeon: Dereck Leep, MD;  Location: [facility] ORS;  Service: Orthopedics;  Laterality: Left;    FAMILY HISTORY Family History  Problem Relation Age of Onset  . Breast cancer Maternal Aunt   . Breast cancer Cousin     GYNECOLOGIC HISTORY:  No LMP recorded. Patient is postmenopausal.     ADVANCED DIRECTIVES:  HEALTH MAINTENANCE: Social History  Substance Use Topics  . Smoking status: Former Smoker    Quit date: 07/29/1994  . Smokeless tobacco: None  . Alcohol Use: No       No Known Allergies  Current Outpatient Prescriptions    Medication Sig Dispense Refill  . ALPRAZolam (XANAX) 0.5 MG tablet Take by mouth.    Marland Kitchen amLODipine-olmesartan (AZOR) 5-20 MG per tablet Take 1 tablet by mouth daily.    . calcium acetate (PHOSLO) 667 MG capsule Take by mouth 2 (two) times daily.    . Cholecalciferol (VITAMIN D-3) 1000 UNITS CAPS Take 1 capsule by mouth daily.    . citalopram (CELEXA) 40 MG tablet Take 1 tablet by mouth  daily 90 tablet 3  . Fe Fum-FePoly-FA-Vit C-Vit B3 (FOLIVANE-F) 125-1 MG CAPS Take 1 capsule by mouth 2 (two) times daily.    Marland Kitchen glipiZIDE (GLUCOTROL) 10 MG tablet Take 10 mg by mouth 2 (two) times daily before a meal.    . HYDROcodone-acetaminophen (NORCO) 10-325 MG tablet Take by mouth.    . levothyroxine (SYNTHROID, LEVOTHROID) 150 MCG tablet Take 150 mcg by mouth daily before breakfast.    . metFORMIN (GLUCOPHAGE) 1000 MG tablet Take 1,000 mg by mouth 2 (two) times daily with a meal.    . nebivolol (BYSTOLIC) 5 MG tablet Take 5 mg by mouth daily.    . nebivolol (BYSTOLIC) 5 MG tablet Reported on 07/07/2015    . pantoprazole (PROTONIX) 40 MG tablet Take 40 mg by mouth daily.    . pioglitazone (ACTOS) 30 MG tablet Take 30 mg by mouth daily.    . sitaGLIPtin (JANUVIA) 100 MG tablet Take 100 mg by mouth daily.    . vitamin B-12 (CYANOCOBALAMIN) 1000 MCG tablet Take 1,000 mcg by mouth daily. Reported on 07/07/2015    . enoxaparin (LOVENOX) 40 MG/0.4ML injection Inject 0.4 mLs (40 mg total) into the skin daily. 14 Syringe 0   No current facility-administered medications for this visit.    OBJECTIVE: BP 137/73 mmHg  Pulse 74  Temp(Src) 95.8 F (35.4 C) (Tympanic)  Resp 18  Wt 208 lb 1.8 oz (94.4 kg)   Body mass index is 33.61 kg/(m^2).    ECOG FS:1 - Symptomatic but completely ambulatory  General: Well-developed, well-nourished, no acute distress. Eyes: Pink conjunctiva, anicteric sclera. HEENT: Normocephalic, moist mucous membranes, clear oropharnyx. Some edema collected in neck. Lungs: Clear to  auscultation bilaterally. Heart: Regular rate and rhythm. No rubs, murmurs, or gallops. Abdomen: Soft, nontender, nondistended. No organomegaly noted, normoactive bowel sounds. Musculoskeletal: No edema, cyanosis, or clubbing. Recent left knee replacement Neuro: Alert, answering all questions appropriately. Cranial nerves grossly intact. Skin: No rashes or petechiae noted. Psych: Normal affect. Lymphatics: No cervical, calvicular, axillary or inguinal LAD.   LAB RESULTS:  Appointment on 08/04/2015  Component Date Value Ref Range Status  . Hemoglobin 08/04/2015 11.1* 12.0 - 16.0 g/dL Final  . Ferritin 08/04/2015 92  11 - 307 ng/mL Final  . Iron 08/04/2015 39  28 - 170 ug/dL Final  . TIBC 08/04/2015 375  250 - 450 ug/dL Final  . Saturation Ratios 08/04/2015 10* 10.4 - 31.8 % Final  . UIBC 08/04/2015 336   Final    STUDIES: No results found.  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  PLAN:   1. Colon CA. Clinically there is no  evidence of disease. Colonoscopy was performed in May 2014 and was reported as negative. Hemoglobin has improved to 11.1.  Upper and lower endoscopy has been scheduled on July 6. Swelling in the naked may be slightly larger but is soft and suggest probably collecting serous fluid following thyroidectomy will get ENT physician to evaluate.  PET scan would be done for restaging because of hemoptysis.  A PET scan is rejected for some other reason then CT scan of head and neck area and CT scan of chest can be done for restaging and ruling out metastases and disease from kidney cancer. In my absence patient will be evaluated by Dr. B after PET scan or CT scan is done and after upper and lower endoscopy and follow-up with another blood count and iron studies    Forest Gleason, MD   08/04/2015 11:29 AM

## 2015-08-04 NOTE — Progress Notes (Signed)
Patient here today to discuss her throat with MD.  States she has been coughing up brownish blood.

## 2015-08-11 ENCOUNTER — Encounter
Admission: RE | Admit: 2015-08-11 | Discharge: 2015-08-11 | Disposition: A | Discharge status: Home / Self Care | Payer: Medicare Other | Source: Ambulatory Visit | Attending: Oncology | Admitting: Oncology

## 2015-08-11 DIAGNOSIS — C799 Secondary malignant neoplasm of unspecified site: Secondary | ICD-10-CM | POA: Diagnosis present

## 2015-08-11 DIAGNOSIS — C649 Malignant neoplasm of unspecified kidney, except renal pelvis: Secondary | ICD-10-CM | POA: Diagnosis not present

## 2015-08-11 LAB — GLUCOSE, CAPILLARY: GLUCOSE-CAPILLARY: 138 mg/dL — AB (ref 65–99)

## 2015-08-11 MED ORDER — FLUDEOXYGLUCOSE F - 18 (FDG) INJECTION
12.0000 | Freq: Once | INTRAVENOUS | Status: AC | PRN
  Administered 2015-08-11: 12.94 via INTRAVENOUS

## 2015-08-19 ENCOUNTER — Telehealth: Payer: Self-pay | Admitting: *Deleted

## 2015-08-19 NOTE — Telephone Encounter (Signed)
Patient is having colonoscopy with Dr. Vira Agar on 09-01-15.  Will have labs @ [facility] on 08-22-15.  (Hgb, etc.)  Please call Butch Penny back to discuss patient's multiple appointments and see if some of these can be coordinated.

## 2015-08-19 NOTE — Telephone Encounter (Signed)
i called and left vm for patient on daughter donna neese's vm. Dr. B wants to see pt in [place] [facility] next Tuesday  08/23/15 at 130pm with labs.  we received a note from DR. Mcqueen asking for medical clearance for patient's anemia. This would be the best date to see pt .

## 2015-08-19 NOTE — Telephone Encounter (Signed)
rn left msg on daughter's voice mail. Ok to coordinate labs. Pt will need a cbc and iron studies, ferritin drawn at minimal. Called patient's daughter to confirm what labs will be drawn on Monday at [place] office. MD would still like to see patient in [place] on Tuesday as planned. Per Mickel Baas in cancer center scheduling, pt made aware of this appointment time.  She requested that Dr. Rogue Bussing use the labs from [place] office rather than having labs drawn again in [place] on Tuesday.

## 2015-08-22 ENCOUNTER — Telehealth: Payer: Self-pay | Admitting: *Deleted

## 2015-08-22 NOTE — Telephone Encounter (Signed)
Confused as to why she needs lab and md tomorrow when she is having colonscopy on 7/6. She did just have IIBC Ferr and CBC on 6/8.   I spoke with Dr B and he agreed to cancel the appt tomorrow and see her on the 7th as planned and can work her anemia up then which will be prior to ENT Surgery

## 2015-08-23 ENCOUNTER — Other Ambulatory Visit: Payer: Medicare Other

## 2015-08-23 ENCOUNTER — Ambulatory Visit: Payer: Medicare Other | Admitting: Internal Medicine

## 2015-08-31 ENCOUNTER — Telehealth: Payer: Self-pay | Admitting: *Deleted

## 2015-08-31 NOTE — Telephone Encounter (Signed)
Called and LVM @ ENT that patient has an appointment on 09-02-15 and refused to come in earlier for appointment, therefore, we do not have clearance form completed and are unable to do so until patient is seen on 09-02-15.

## 2015-08-31 NOTE — Telephone Encounter (Signed)
-----   Message from Cephus Richer sent at 08/31/2015  9:49 AM EDT ----- Danae Chen  From ENT [phone] want to know if we have filled out clearance form. Please call her back.

## 2015-09-01 ENCOUNTER — Ambulatory Visit: Payer: Medicare Other | Admitting: Anesthesiology

## 2015-09-01 ENCOUNTER — Other Ambulatory Visit: Payer: Medicare Other

## 2015-09-01 ENCOUNTER — Ambulatory Visit
Admission: RE | Admit: 2015-09-01 | Discharge: 2015-09-01 | Disposition: A | Discharge status: Home / Self Care | Payer: Medicare Other | Source: Ambulatory Visit | Attending: Unknown Physician Specialty | Admitting: Unknown Physician Specialty

## 2015-09-01 ENCOUNTER — Ambulatory Visit: Payer: Medicare Other | Admitting: Internal Medicine

## 2015-09-01 ENCOUNTER — Encounter: Payer: Self-pay | Admitting: Anesthesiology

## 2015-09-01 ENCOUNTER — Encounter
Admission: RE | Disposition: A | Discharge status: Home / Self Care | Payer: Self-pay | Source: Ambulatory Visit | Attending: Unknown Physician Specialty

## 2015-09-01 DIAGNOSIS — Z96652 Presence of left artificial knee joint: Secondary | ICD-10-CM | POA: Insufficient documentation

## 2015-09-01 DIAGNOSIS — Z96653 Presence of artificial knee joint, bilateral: Secondary | ICD-10-CM | POA: Insufficient documentation

## 2015-09-01 DIAGNOSIS — D123 Benign neoplasm of transverse colon: Secondary | ICD-10-CM | POA: Diagnosis not present

## 2015-09-01 DIAGNOSIS — E1122 Type 2 diabetes mellitus with diabetic chronic kidney disease: Secondary | ICD-10-CM | POA: Insufficient documentation

## 2015-09-01 DIAGNOSIS — Z79899 Other long term (current) drug therapy: Secondary | ICD-10-CM | POA: Insufficient documentation

## 2015-09-01 DIAGNOSIS — I129 Hypertensive chronic kidney disease with stage 1 through stage 4 chronic kidney disease, or unspecified chronic kidney disease: Secondary | ICD-10-CM | POA: Insufficient documentation

## 2015-09-01 DIAGNOSIS — E039 Hypothyroidism, unspecified: Secondary | ICD-10-CM | POA: Diagnosis not present

## 2015-09-01 DIAGNOSIS — Z85038 Personal history of other malignant neoplasm of large intestine: Secondary | ICD-10-CM | POA: Diagnosis not present

## 2015-09-01 DIAGNOSIS — K317 Polyp of stomach and duodenum: Secondary | ICD-10-CM | POA: Diagnosis not present

## 2015-09-01 DIAGNOSIS — Z85850 Personal history of malignant neoplasm of thyroid: Secondary | ICD-10-CM | POA: Insufficient documentation

## 2015-09-01 DIAGNOSIS — E559 Vitamin D deficiency, unspecified: Secondary | ICD-10-CM | POA: Diagnosis not present

## 2015-09-01 DIAGNOSIS — Z7984 Long term (current) use of oral hypoglycemic drugs: Secondary | ICD-10-CM | POA: Diagnosis not present

## 2015-09-01 DIAGNOSIS — D631 Anemia in chronic kidney disease: Secondary | ICD-10-CM | POA: Diagnosis not present

## 2015-09-01 DIAGNOSIS — D127 Benign neoplasm of rectosigmoid junction: Secondary | ICD-10-CM | POA: Diagnosis not present

## 2015-09-01 DIAGNOSIS — D125 Benign neoplasm of sigmoid colon: Secondary | ICD-10-CM | POA: Diagnosis not present

## 2015-09-01 DIAGNOSIS — Z85528 Personal history of other malignant neoplasm of kidney: Secondary | ICD-10-CM | POA: Insufficient documentation

## 2015-09-01 DIAGNOSIS — K219 Gastro-esophageal reflux disease without esophagitis: Secondary | ICD-10-CM | POA: Insufficient documentation

## 2015-09-01 DIAGNOSIS — F419 Anxiety disorder, unspecified: Secondary | ICD-10-CM | POA: Insufficient documentation

## 2015-09-01 DIAGNOSIS — D509 Iron deficiency anemia, unspecified: Secondary | ICD-10-CM | POA: Diagnosis present

## 2015-09-01 DIAGNOSIS — Z8489 Family history of other specified conditions: Secondary | ICD-10-CM | POA: Diagnosis not present

## 2015-09-01 DIAGNOSIS — K648 Other hemorrhoids: Secondary | ICD-10-CM | POA: Diagnosis not present

## 2015-09-01 DIAGNOSIS — N189 Chronic kidney disease, unspecified: Secondary | ICD-10-CM | POA: Diagnosis not present

## 2015-09-01 DIAGNOSIS — D120 Benign neoplasm of cecum: Secondary | ICD-10-CM | POA: Insufficient documentation

## 2015-09-01 DIAGNOSIS — Z9049 Acquired absence of other specified parts of digestive tract: Secondary | ICD-10-CM | POA: Diagnosis not present

## 2015-09-01 DIAGNOSIS — Z87891 Personal history of nicotine dependence: Secondary | ICD-10-CM | POA: Insufficient documentation

## 2015-09-01 DIAGNOSIS — K5520 Angiodysplasia of colon without hemorrhage: Secondary | ICD-10-CM | POA: Diagnosis not present

## 2015-09-01 DIAGNOSIS — K2950 Unspecified chronic gastritis without bleeding: Secondary | ICD-10-CM | POA: Diagnosis not present

## 2015-09-01 HISTORY — PX: COLONOSCOPY WITH PROPOFOL: SHX5780

## 2015-09-01 HISTORY — PX: ESOPHAGOGASTRODUODENOSCOPY (EGD) WITH PROPOFOL: SHX5813

## 2015-09-01 LAB — GLUCOSE, CAPILLARY: GLUCOSE-CAPILLARY: 214 mg/dL — AB (ref 65–99)

## 2015-09-01 SURGERY — COLONOSCOPY WITH PROPOFOL
Anesthesia: General

## 2015-09-01 MED ORDER — LIDOCAINE HCL (CARDIAC) 20 MG/ML IV SOLN
INTRAVENOUS | Status: DC | PRN
  Administered 2015-09-01: 100 mg via INTRAVENOUS

## 2015-09-01 MED ORDER — PROPOFOL 500 MG/50ML IV EMUL
INTRAVENOUS | Status: DC | PRN
  Administered 2015-09-01: 150 ug/kg/min via INTRAVENOUS

## 2015-09-01 MED ORDER — PROPOFOL 10 MG/ML IV BOLUS
INTRAVENOUS | Status: DC | PRN
  Administered 2015-09-01: 30 mg via INTRAVENOUS
  Administered 2015-09-01: 90 mg via INTRAVENOUS
  Administered 2015-09-01: 30 mg via INTRAVENOUS
  Administered 2015-09-01 (×2): 20 mg via INTRAVENOUS

## 2015-09-01 MED ORDER — GLYCOPYRROLATE 0.2 MG/ML IJ SOLN
INTRAMUSCULAR | Status: DC | PRN
  Administered 2015-09-01: 0.2 mg via INTRAVENOUS

## 2015-09-01 MED ORDER — SODIUM CHLORIDE 0.9 % IV SOLN
INTRAVENOUS | Status: DC
  Administered 2015-09-01: 10:00:00 via INTRAVENOUS

## 2015-09-01 MED ORDER — PIPERACILLIN-TAZOBACTAM 3.375 G IVPB
3.3750 g | Freq: Once | INTRAVENOUS | Status: AC
  Administered 2015-09-01: 3.375 g via INTRAVENOUS
  Filled 2015-09-01: qty 50

## 2015-09-01 MED ORDER — SODIUM CHLORIDE 0.9 % IV SOLN
INTRAVENOUS | Status: DC
Start: 2015-09-01 — End: 2015-09-01

## 2015-09-01 MED ORDER — EPHEDRINE SULFATE 50 MG/ML IJ SOLN
INTRAMUSCULAR | Status: DC | PRN
  Administered 2015-09-01 (×3): 10 mg via INTRAVENOUS

## 2015-09-01 NOTE — Anesthesia Preprocedure Evaluation (Signed)
Anesthesia Evaluation  Patient identified by MRN, date of birth, ID band Patient awake    Reviewed: Allergy & Precautions, H&P , NPO status , Patient's Chart, lab work & pertinent test results, reviewed documented beta blocker date and time   History of Anesthesia Complications Negative for: history of anesthetic complications  Airway Mallampati: IV  TM Distance: >3 FB Neck ROM: full    Dental no notable dental hx. (+) Edentulous Upper, Upper Dentures, Partial Lower, Poor Dentition, Missing   Pulmonary neg pulmonary ROS, former smoker,    Pulmonary exam normal breath sounds clear to auscultation       Cardiovascular Exercise Tolerance: Good hypertension, (-) angina(-) CAD, (-) Past MI, (-) Cardiac Stents and (-) CABG Normal cardiovascular exam+ dysrhythmias Supra Ventricular Tachycardia (-) Valvular Problems/Murmurs Rhythm:regular Rate:Normal     Neuro/Psych negative neurological ROS  negative psych ROS   GI/Hepatic GERD  Medicated,NAFLD   Endo/Other  negative endocrine ROSdiabetes, Well Controlled, Oral Hypoglycemic AgentsHypothyroidism   Renal/GU CRFRenal disease  negative genitourinary   Musculoskeletal   Abdominal   Peds  Hematology  (+) Blood dyscrasia, anemia ,   Anesthesia Other Findings Past Medical History:   Anxiety                                                      Hypertension                                                 Hypothyroidism                                               Diabetes mellitus without complication (HCC)                 GERD (gastroesophageal reflux disease)                       Anemia                                                       Kidney cancer, primary, with metastasis from k* 08/26/2014    Colon cancer (HCC)                                           Thyroid cancer ([place])                                         Reproductive/Obstetrics negative OB ROS                              Anesthesia Physical Anesthesia Plan  ASA: III  Anesthesia Plan:    Post-op  Pain Management:    Induction:   Airway Management Planned:   Additional Equipment:   Intra-op Plan:   Post-operative Plan:   Informed Consent: I have reviewed the patients History and Physical, chart, labs and discussed the procedure including the risks, benefits and alternatives for the proposed anesthesia with the patient or authorized representative who has indicated his/her understanding and acceptance.   Dental Advisory Given  Plan Discussed with: Anesthesiologist, CRNA and Surgeon  Anesthesia Plan Comments:         Anesthesia Quick Evaluation

## 2015-09-01 NOTE — Transfer of Care (Signed)
Immediate Anesthesia Transfer of Care Note  Patient: Rebecca Kirby  Procedure(s) Performed: Procedure(s): COLONOSCOPY WITH PROPOFOL (N/A) ESOPHAGOGASTRODUODENOSCOPY (EGD) WITH PROPOFOL (N/A)  Patient Location: Endoscopy Unit  Anesthesia Type:General  Level of Consciousness: awake, alert  and oriented  Airway & Oxygen Therapy: Patient Spontanous Breathing and Patient connected to nasal cannula oxygen  Post-op Assessment: Report given to RN and Post -op Vital signs reviewed and stable  Post vital signs: Reviewed and stable  Last Vitals:  Filed Vitals:   09/01/15 0957  BP: 138/53  Pulse: 62  Temp: 35.9 C  Resp: 20    Last Pain: There were no vitals filed for this visit.       Complications: No apparent anesthesia complications

## 2015-09-01 NOTE — H&P (Signed)
Primary Care Physician:  Idelle Crouch, MD Primary Gastroenterologist:  Dr. Vira Agar  Pre-Procedure History & Physical: HPI:  Rebecca Kirby is a 70 y.o. female is here for an endoscopy and colonoscopy.   Past Medical History  Diagnosis Date  . Anxiety   . Hypertension   . Hypothyroidism   . Diabetes mellitus without complication ([place])   . GERD (gastroesophageal reflux disease)   . Anemia   . Kidney cancer, primary, with metastasis from kidney to other site [facility]) 08/26/2014  . Colon cancer ([employer])   . Thyroid cancer [facility])     Past Surgical History  Procedure Laterality Date  . Colon resection    . Rt kidney removed Right   . Cholecystectomy    . Thyroid surgery Bilateral   . Total knee arthroplasty Right   . Total knee arthroplasty Left 08/09/2014    Procedure: TOTAL KNEE ARTHROPLASTY;  Surgeon: Dereck Leep, MD;  Location: [facility] ORS;  Service: Orthopedics;  Laterality: Left;    Prior to Admission medications   Medication Sig Start Date End Date Taking? Authorizing Provider  calcium acetate (PHOSLO) 667 MG capsule Take by mouth 2 (two) times daily.   Yes Historical Provider, MD  citalopram (CELEXA) 40 MG tablet Take 1 tablet by mouth  daily 01/24/15  Yes Evlyn Kanner, NP  levothyroxine (SYNTHROID, LEVOTHROID) 150 MCG tablet Take 150 mcg by mouth daily before breakfast.   Yes Historical Provider, MD  nebivolol (BYSTOLIC) 5 MG tablet Reported on 07/07/2015 02/09/15  Yes Historical Provider, MD  pantoprazole (PROTONIX) 40 MG tablet Take 40 mg by mouth daily.   Yes Historical Provider, MD  ALPRAZolam Duanne Moron) 0.5 MG tablet Take by mouth. 06/14/14   Historical Provider, MD  amLODipine-olmesartan (AZOR) 5-20 MG per tablet Take 1 tablet by mouth daily.    Historical Provider, MD  Cholecalciferol (VITAMIN D-3) 1000 UNITS CAPS Take 1 capsule by mouth daily.    Historical Provider, MD  enoxaparin (LOVENOX) 40 MG/0.4ML injection Inject 0.4 mLs (40 mg total) into the skin daily.  08/13/14 08/27/14  Watt Climes, PA  Fe Fum-FePoly-FA-Vit C-Vit B3 (FOLIVANE-F) 125-1 MG CAPS Take 1 capsule by mouth 2 (two) times daily.    Historical Provider, MD  glipiZIDE (GLUCOTROL) 10 MG tablet Take 10 mg by mouth 2 (two) times daily before a meal.    Historical Provider, MD  HYDROcodone-acetaminophen (NORCO) 10-325 MG tablet Take by mouth. 06/03/15   Historical Provider, MD  metFORMIN (GLUCOPHAGE) 1000 MG tablet Take 1,000 mg by mouth 2 (two) times daily with a meal.    Historical Provider, MD  nebivolol (BYSTOLIC) 5 MG tablet Take 5 mg by mouth daily.    Historical Provider, MD  pioglitazone (ACTOS) 30 MG tablet Take 30 mg by mouth daily.    Historical Provider, MD  sitaGLIPtin (JANUVIA) 100 MG tablet Take 100 mg by mouth daily.    Historical Provider, MD  vitamin B-12 (CYANOCOBALAMIN) 1000 MCG tablet Take 1,000 mcg by mouth daily. Reported on 07/07/2015    Historical Provider, MD    Allergies as of 08/29/2015  . (No Known Allergies)    Family History  Problem Relation Age of Onset  . Breast cancer Maternal Aunt   . Breast cancer Cousin     Social History   Social History  . Marital Status: Widowed    Spouse Name: N/A  . Number of Children: N/A  . Years of Education: N/A   Occupational History  . Not on file.  Social History Main Topics  . Smoking status: Former Smoker    Quit date: 07/29/1994  . Smokeless tobacco: Not on file  . Alcohol Use: No  . Drug Use: No  . Sexual Activity: Not on file   Other Topics Concern  . Not on file   Social History Narrative    Review of Systems: See HPI, otherwise negative ROS  Physical Exam: BP 138/53 mmHg  Pulse 62  Temp(Src) 96.6 F (35.9 C) (Tympanic)  Resp 20  Ht 5\' 6"  (1.676 m)  Wt 92.534 kg (204 lb)  BMI 32.94 kg/m2  SpO2 96% General:   Alert,  pleasant and cooperative in NAD Head:  Normocephalic and atraumatic. Neck:  Supple; no masses or thyromegaly. Lungs:  Clear throughout to auscultation.    Heart:   Regular rate and rhythm. Abdomen:  Soft, nontender and nondistended. Normal bowel sounds, without guarding, and without rebound.   Neurologic:  Alert and  oriented x4;  grossly normal neurologically.  Impression/Plan: Rebecca Kirby is here for an endoscopy and colonoscopy to be performed for iron def anemia  Risks, benefits, limitations, and alternatives regarding  endoscopy and colonoscopy have been reviewed with the patient.  Questions have been answered.  All parties agreeable.   Gaylyn Cheers, MD  09/01/2015, 10:48 AM

## 2015-09-01 NOTE — Op Note (Signed)
[facility] Gastroenterology Patient Name: Rebecca Kirby Procedure Date: 09/01/2015 10:54 AM MRN: [id number] Account #: [id number] Date of Birth: 1945/04/11 Admit Type: Outpatient Age: 70 Room: [facility] ENDO ROOM 1 Gender: Female Note Status: Finalized Procedure:            Upper GI endoscopy Indications:          Iron deficiency anemia Providers:            Manya Silvas, MD Referring MD:         Leonie Douglas. Doy Hutching, MD (Referring MD) Medicines:            Propofol per Anesthesia Complications:        No immediate complications. Procedure:            Pre-Anesthesia Assessment:                       - After reviewing the risks and benefits, the patient                        was deemed in satisfactory condition to undergo the                        procedure.                       After obtaining informed consent, the endoscope was                        passed under direct vision. Throughout the procedure,                        the patient's blood pressure, pulse, and oxygen                        saturations were monitored continuously. The Endoscope                        was introduced through the mouth, and advanced to the                        second part of duodenum. The upper GI endoscopy was                        accomplished without difficulty. The patient tolerated                        the procedure well. Findings:      The examined esophagus was normal.      Diffuse and patchy moderate inflammation characterized by congestion       (edema), erythema and granularity was found in the gastric body.       Biopsies were taken with a cold forceps for histology.      A few medium pedunculated and sessile polyps with no bleeding and no       stigmata of recent bleeding were found in the gastric antrum. Biopsies       were taken with a cold forceps for histology. Impression:           - Normal esophagus.                       -  Gastritis. Biopsied.                 - A few gastric polyps. Biopsied. Recommendation:       - Await pathology results. Manya Silvas, MD 09/01/2015 11:14:07 AM This report has been signed electronically. Number of Addenda: 0 Note Initiated On: 09/01/2015 10:54 AM      [facility]

## 2015-09-01 NOTE — Anesthesia Postprocedure Evaluation (Signed)
Anesthesia Post Note  Patient: Rebecca Kirby  Procedure(s) Performed: Procedure(s) (LRB): COLONOSCOPY WITH PROPOFOL (N/A) ESOPHAGOGASTRODUODENOSCOPY (EGD) WITH PROPOFOL (N/A)  Patient location during evaluation: Endoscopy Anesthesia Type: General Level of consciousness: awake and alert Pain management: pain level controlled Vital Signs Assessment: post-procedure vital signs reviewed and stable Respiratory status: spontaneous breathing, nonlabored ventilation, respiratory function stable and patient connected to nasal cannula oxygen Cardiovascular status: blood pressure returned to baseline and stable Postop Assessment: no signs of nausea or vomiting Anesthetic complications: no    Last Vitals:  Filed Vitals:   09/01/15 1220 09/01/15 1228  BP: 121/52 125/54  Pulse: 71 71  Temp:    Resp: 14 20    Last Pain: There were no vitals filed for this visit.               Martha Clan

## 2015-09-01 NOTE — Op Note (Signed)
[facility] Gastroenterology Patient Name: Rebecca Kirby Procedure Date: 09/01/2015 10:56 AM MRN: [id number] Account #: [id number] Date of Birth: 03-25-1945 Admit Type: Outpatient Age: 70 Room: [facility] ENDO ROOM 1 Gender: Female Note Status: Finalized Procedure:            Colonoscopy Indications:          Iron deficiency anemia Providers:            Manya Silvas, MD Referring MD:         Leonie Douglas. Doy Hutching, MD (Referring MD) Complications:        No immediate complications. Procedure:            Pre-Anesthesia Assessment:                       - After reviewing the risks and benefits, the patient                        was deemed in satisfactory condition to undergo the                        procedure.                       After obtaining informed consent, the colonoscope was                        passed under direct vision. Throughout the procedure,                        the patient's blood pressure, pulse, and oxygen                        saturations were monitored continuously. The                        Colonoscope was introduced through the anus and                        advanced to the the cecum, identified by appendiceal                        orifice and ileocecal valve. The colonoscopy was                        performed without difficulty. The patient tolerated the                        procedure well. The quality of the bowel preparation                        was good. Findings:      A small polyp was found in the cecum. The polyp was sessile. The polyp       was removed with a hot snare. Resection and retrieval were complete.      A diminutive polyp was found in the hepatic flexure. The polyp was       sessile.      Area in sigmoid colon where surgery done.      A small polyp was found in the sigmoid colon. The polyp was sessile. The       polyp  was removed with a hot snare. Resection and retrieval were       complete.      A diminutive  polyp was found in the recto-sigmoid colon. The polyp was       sessile. The polyp was removed with a jumbo cold forceps. Resection and       retrieval were complete.      Multiple medium-sized diffuse angioectasias without bleeding were found       in the transverse colon, in the ascending colon and in the cecum.       Coagulation for tissue destruction using argon plasma at 0.4       liters/minute and 20 watts was successful. Impression:           - One small polyp in the cecum, removed with a hot                        snare. Resected and retrieved.                       - One diminutive polyp at the hepatic flexure.                       - One small polyp in the sigmoid colon, removed with a                        hot snare. Resected and retrieved.                       - One diminutive polyp at the recto-sigmoid colon,                        removed with a jumbo cold forceps. Resected and                        retrieved.                       - Multiple non-bleeding colonic angioectasias. Treated                        with argon plasma coagulation (APC). Recommendation:       - Await pathology results. Manya Silvas, MD 09/01/2015 11:57:41 AM This report has been signed electronically. Number of Addenda: 0 Note Initiated On: 09/01/2015 10:56 AM Scope Withdrawal Time: 0 hours 32 minutes 3 seconds  Total Procedure Duration: 0 hours 37 minutes 37 seconds       [facility]

## 2015-09-02 ENCOUNTER — Inpatient Hospital Stay (HOSPITAL_BASED_OUTPATIENT_CLINIC_OR_DEPARTMENT_OTHER): Payer: Medicare Other | Admitting: Internal Medicine

## 2015-09-02 ENCOUNTER — Encounter: Payer: Self-pay | Admitting: Unknown Physician Specialty

## 2015-09-02 ENCOUNTER — Other Ambulatory Visit: Payer: Self-pay

## 2015-09-02 ENCOUNTER — Other Ambulatory Visit: Payer: Self-pay | Admitting: *Deleted

## 2015-09-02 ENCOUNTER — Inpatient Hospital Stay: Payer: Medicare Other | Attending: Internal Medicine

## 2015-09-02 ENCOUNTER — Encounter (INDEPENDENT_AMBULATORY_CARE_PROVIDER_SITE_OTHER): Payer: Self-pay

## 2015-09-02 VITALS — BP 118/69 | HR 76 | Temp 97.7°F | Resp 18 | Wt 203.3 lb

## 2015-09-02 DIAGNOSIS — C189 Malignant neoplasm of colon, unspecified: Secondary | ICD-10-CM

## 2015-09-02 DIAGNOSIS — Z79899 Other long term (current) drug therapy: Secondary | ICD-10-CM | POA: Insufficient documentation

## 2015-09-02 DIAGNOSIS — K7460 Unspecified cirrhosis of liver: Secondary | ICD-10-CM | POA: Insufficient documentation

## 2015-09-02 DIAGNOSIS — Z803 Family history of malignant neoplasm of breast: Secondary | ICD-10-CM | POA: Insufficient documentation

## 2015-09-02 DIAGNOSIS — C7989 Secondary malignant neoplasm of other specified sites: Secondary | ICD-10-CM

## 2015-09-02 DIAGNOSIS — I10 Essential (primary) hypertension: Secondary | ICD-10-CM | POA: Insufficient documentation

## 2015-09-02 DIAGNOSIS — E890 Postprocedural hypothyroidism: Secondary | ICD-10-CM | POA: Diagnosis not present

## 2015-09-02 DIAGNOSIS — F419 Anxiety disorder, unspecified: Secondary | ICD-10-CM | POA: Insufficient documentation

## 2015-09-02 DIAGNOSIS — D500 Iron deficiency anemia secondary to blood loss (chronic): Secondary | ICD-10-CM | POA: Insufficient documentation

## 2015-09-02 DIAGNOSIS — C649 Malignant neoplasm of unspecified kidney, except renal pelvis: Secondary | ICD-10-CM

## 2015-09-02 DIAGNOSIS — D170 Benign lipomatous neoplasm of skin and subcutaneous tissue of head, face and neck: Secondary | ICD-10-CM

## 2015-09-02 DIAGNOSIS — K219 Gastro-esophageal reflux disease without esophagitis: Secondary | ICD-10-CM | POA: Insufficient documentation

## 2015-09-02 DIAGNOSIS — D509 Iron deficiency anemia, unspecified: Secondary | ICD-10-CM | POA: Diagnosis not present

## 2015-09-02 DIAGNOSIS — Z7984 Long term (current) use of oral hypoglycemic drugs: Secondary | ICD-10-CM | POA: Insufficient documentation

## 2015-09-02 DIAGNOSIS — E119 Type 2 diabetes mellitus without complications: Secondary | ICD-10-CM

## 2015-09-02 DIAGNOSIS — Z87891 Personal history of nicotine dependence: Secondary | ICD-10-CM | POA: Insufficient documentation

## 2015-09-02 DIAGNOSIS — C641 Malignant neoplasm of right kidney, except renal pelvis: Secondary | ICD-10-CM

## 2015-09-02 DIAGNOSIS — Z85038 Personal history of other malignant neoplasm of large intestine: Secondary | ICD-10-CM

## 2015-09-02 DIAGNOSIS — D638 Anemia in other chronic diseases classified elsewhere: Secondary | ICD-10-CM

## 2015-09-02 LAB — CBC WITH DIFFERENTIAL/PLATELET
BASOS ABS: 0.1 10*3/uL (ref 0–0.1)
Basophils Relative: 1 %
EOS PCT: 1 %
Eosinophils Absolute: 0.1 10*3/uL (ref 0–0.7)
HEMATOCRIT: 32.9 % — AB (ref 35.0–47.0)
HEMOGLOBIN: 11 g/dL — AB (ref 12.0–16.0)
LYMPHS ABS: 1 10*3/uL (ref 1.0–3.6)
LYMPHS PCT: 9 %
MCH: 28.2 pg (ref 26.0–34.0)
MCHC: 33.4 g/dL (ref 32.0–36.0)
MCV: 84.3 fL (ref 80.0–100.0)
Monocytes Absolute: 0.4 10*3/uL (ref 0.2–0.9)
Monocytes Relative: 4 %
NEUTROS ABS: 9.2 10*3/uL — AB (ref 1.4–6.5)
NEUTROS PCT: 85 %
PLATELETS: 209 10*3/uL (ref 150–440)
RBC: 3.9 MIL/uL (ref 3.80–5.20)
RDW: 19.6 % — ABNORMAL HIGH (ref 11.5–14.5)
WBC: 10.7 10*3/uL (ref 3.6–11.0)

## 2015-09-02 LAB — COMPREHENSIVE METABOLIC PANEL
ALK PHOS: 63 U/L (ref 38–126)
ALT: 14 U/L (ref 14–54)
ANION GAP: 9 (ref 5–15)
AST: 23 U/L (ref 15–41)
Albumin: 4 g/dL (ref 3.5–5.0)
BILIRUBIN TOTAL: 0.4 mg/dL (ref 0.3–1.2)
BUN: 15 mg/dL (ref 6–20)
CALCIUM: 8.4 mg/dL — AB (ref 8.9–10.3)
CO2: 23 mmol/L (ref 22–32)
Chloride: 101 mmol/L (ref 101–111)
Creatinine, Ser: 1.21 mg/dL — ABNORMAL HIGH (ref 0.44–1.00)
GFR calc non Af Amer: 44 mL/min — ABNORMAL LOW (ref >60)
GFR, EST AFRICAN AMERICAN: 51 mL/min — AB (ref >60)
GLUCOSE: 364 mg/dL — AB (ref 65–99)
POTASSIUM: 4.7 mmol/L (ref 3.5–5.1)
Sodium: 133 mmol/L — ABNORMAL LOW (ref 135–145)
TOTAL PROTEIN: 7.1 g/dL (ref 6.5–8.1)

## 2015-09-02 LAB — SURGICAL PATHOLOGY

## 2015-09-02 NOTE — Assessment & Plan Note (Addendum)
#   Iron deficiency anemia- unclear etiology ; repeat EGD colonoscopy negative. stable post PRBC transfusion IV iron June 2017; today hemoglobin 11.2. Recommend checking- iron studies today; and if needed IV iron. Patient will continue to be on by mouth iron.  Will also check labs/10 days prior to surgery [neck lipoma];   # RCC- clear-cell type; status post right nephrectomy; incidental focus of metastases in the right thyroid gland status post r thyroidectomy. Continue surveillance at this time.  # Colon ca- stage I- no evidence of recurrence.  # Cirrhosis PETscan- question Right hepatic lobe nodular lesion; recommend MRI of the liver prior to next visit in 2 months.  # Lipoma of the neck- awaiting resection in few weeks from now. Given her hemoglobin 11.2 today; patient should be at low risk for her neck surgery from anemia standpoint. We will also recheck her labs 10 days from now.    I reviewed the images myself and with the patient and family in detail. A copy of this report was given.

## 2015-09-02 NOTE — Progress Notes (Signed)
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 McQueen] Incidetal- 0.10mm focus of RCC in R thyroid lobe; Multifocal benign follicular adenomatous nodules   # Iron deficiency anemia requiring blood transfusion and intravenous iron therapy (May of 2017)  # Cirrhosis of liver [PET June 2017]- ? Right hepatic lobe focus- MRI liver pend     Kidney cancer, primary, with metastasis from kidney to other site [facility])   08/26/2014 Initial Diagnosis Kidney cancer, primary, with metastasis from kidney to other site [facility])    INTERVAL HISTORY:  This is my first interaction with the patient as patient's primary oncologist has been Dr.Choksi. I reviewed the patient's prior charts/pertinent labs/imaging in detail; findings are summarized above.    Rebecca Kirby 70 y.o.  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  Patient again had a EGD colonoscopy this week- that showed no obvious evidence of blood loss. Patient has been taking iron pills twice a day. Denies any abdominal pain nausea vomiting. She has been very anxious.  REVIEW OF SYSTEMS:  A complete 10 point review of system is done which is negative except mentioned above/history of present illness.   PAST MEDICAL HISTORY :  Past Medical History  Diagnosis Date  . Anxiety   . Hypertension   . Hypothyroidism   . Diabetes mellitus without complication ([place])   . GERD (gastroesophageal reflux disease)   . Anemia   . Kidney cancer, primary, with metastasis from kidney to other site  [facility]) 08/26/2014  . Colon cancer ([place])   . Thyroid cancer ([place])     PAST SURGICAL HISTORY :   Past Surgical History  Procedure Laterality Date  . Colon resection    . Rt kidney removed Right   . Cholecystectomy    . Thyroid surgery Bilateral   . Total knee arthroplasty Right   . Total knee arthroplasty Left 08/09/2014    Procedure: TOTAL KNEE ARTHROPLASTY;  Surgeon: Dereck Leep, MD;  Location: [facility] ORS;  Service: Orthopedics;  Laterality: Left;  . Colonoscopy with propofol N/A 09/01/2015    Procedure: COLONOSCOPY WITH PROPOFOL;  Surgeon: Manya Silvas, MD;  Location: [facility] ENDOSCOPY;  Service: Endoscopy;  Laterality: N/A;  . Esophagogastroduodenoscopy (egd) with propofol N/A 09/01/2015    Procedure: ESOPHAGOGASTRODUODENOSCOPY (EGD) WITH PROPOFOL;  Surgeon: Manya Silvas, MD;  Location: [facility] ENDOSCOPY;  Service: Endoscopy;  Laterality: N/A;    FAMILY HISTORY :   Family History  Problem Relation Age of Onset  . Breast cancer Maternal Aunt   . Breast cancer Cousin     SOCIAL HISTORY:   Social History  Substance Use Topics  . Smoking status: Former Smoker    Quit date: 07/29/1994  . Smokeless tobacco: Not on file  . Alcohol Use: No    ALLERGIES:  has No Known Allergies.  MEDICATIONS:  Current Outpatient Prescriptions  Medication Sig Dispense Refill  . amLODipine-olmesartan (AZOR) 5-20 MG per tablet Take 1 tablet by mouth daily.    . calcium acetate (PHOSLO) 667 MG capsule Take by mouth  2 (two) times daily.    . Cholecalciferol (VITAMIN D-3) 1000 UNITS CAPS Take 1 capsule by mouth daily.    . citalopram (CELEXA) 40 MG tablet Take 1 tablet by mouth  daily 90 tablet 3  . Fe Fum-FePoly-FA-Vit C-Vit B3 (FOLIVANE-F) 125-1 MG CAPS Take 1 capsule by mouth 2 (two) times daily.    Marland Kitchen glipiZIDE (GLUCOTROL) 10 MG tablet Take 10 mg by mouth 2 (two) times daily before a meal.    . HYDROcodone-acetaminophen (NORCO) 10-325 MG tablet Take by mouth.    . levothyroxine (SYNTHROID,  LEVOTHROID) 150 MCG tablet Take 150 mcg by mouth daily before breakfast.    . metFORMIN (GLUCOPHAGE) 1000 MG tablet Take 1,000 mg by mouth 2 (two) times daily with a meal.    . nebivolol (BYSTOLIC) 5 MG tablet Take 5 mg by mouth daily.    . pantoprazole (PROTONIX) 40 MG tablet Take 40 mg by mouth daily.    . pioglitazone (ACTOS) 30 MG tablet Take 30 mg by mouth daily.    . sitaGLIPtin (JANUVIA) 100 MG tablet Take 100 mg by mouth daily.    . vitamin B-12 (CYANOCOBALAMIN) 1000 MCG tablet Take 1,000 mcg by mouth daily. Reported on 07/07/2015    . enoxaparin (LOVENOX) 40 MG/0.4ML injection Inject 0.4 mLs (40 mg total) into the skin daily. 14 Syringe 0  . nebivolol (BYSTOLIC) 5 MG tablet Reported on 09/02/2015     No current facility-administered medications for this visit.    PHYSICAL EXAMINATION: ECOG PERFORMANCE STATUS: 0 - Asymptomatic  BP 118/69 mmHg  Pulse 76  Temp(Src) 97.7 F (36.5 C) (Tympanic)  Resp 18  Wt 203 lb 4.2 oz (92.2 kg)  Filed Weights   09/02/15 1057  Weight: 203 lb 4.2 oz (92.2 kg)    GENERAL: Well-nourished well-developed; Alert, no distress and comfortable.  With her daughter Shelly Bombard.  EYES: no pallor or icterus OROPHARYNX: no thrush or ulceration; good dentition  NECK: supple, obvious neck mass noted in midline [lipoma] LYMPH:  no palpable lymphadenopathy in the cervical, axillary or inguinal regions LUNGS: clear to auscultation and  No wheeze or crackles HEART/CVS: regular rate & rhythm and no murmurs; No lower extremity edema ABDOMEN:abdomen soft, non-tender and normal bowel sounds Musculoskeletal:no cyanosis of digits and no clubbing  PSYCH: alert & oriented x 3 with fluent speech NEURO: no focal motor/sensory deficits SKIN:  no rashes or significant lesions  LABORATORY DATA:  I have reviewed the data as listed    Component Value Date/Time   NA 133* 09/02/2015 0937   NA 138 03/05/2014 0935   K 4.7 09/02/2015 0937   K 4.6 03/05/2014 0935   CL 101  09/02/2015 0937   CL 103 03/05/2014 0935   CO2 23 09/02/2015 0937   CO2 25 03/05/2014 0935   GLUCOSE 364* 09/02/2015 0937   GLUCOSE 200* 03/05/2014 0935   BUN 15 09/02/2015 0937   BUN 20* 03/05/2014 0935   CREATININE 1.21* 09/02/2015 0937   CREATININE 1.46* 03/05/2014 0935   CALCIUM 8.4* 09/02/2015 0937   CALCIUM 9.5 03/05/2014 0935   PROT 7.1 09/02/2015 0937   PROT 7.3 02/02/2014 1454   ALBUMIN 4.0 09/02/2015 0937   ALBUMIN 3.1* 02/10/2014 0410   AST 23 09/02/2015 0937   AST 35 02/02/2014 1454   ALT 14 09/02/2015 0937   ALT 27 02/02/2014 1454   ALKPHOS 63 09/02/2015 0937   ALKPHOS 72 02/02/2014 1454   BILITOT 0.4 09/02/2015 0937   BILITOT 0.2 02/02/2014 1454  GFRNONAA 44* 09/02/2015 0937   GFRNONAA 38* 03/05/2014 0935   GFRNONAA 50* 10/02/2013 1027   GFRAA 51* 09/02/2015 0937   GFRAA 46* 03/05/2014 0935   GFRAA 58* 10/02/2013 1027    No results found for: SPEP, UPEP  Lab Results  Component Value Date   WBC 10.7 09/02/2015   NEUTROABS 9.2* 09/02/2015   HGB 11.0* 09/02/2015   HCT 32.9* 09/02/2015   MCV 84.3 09/02/2015   PLT 209 09/02/2015      Chemistry      Component Value Date/Time   NA 133* 09/02/2015 0937   NA 138 03/05/2014 0935   K 4.7 09/02/2015 0937   K 4.6 03/05/2014 0935   CL 101 09/02/2015 0937   CL 103 03/05/2014 0935   CO2 23 09/02/2015 0937   CO2 25 03/05/2014 0935   BUN 15 09/02/2015 0937   BUN 20* 03/05/2014 0935   CREATININE 1.21* 09/02/2015 0937   CREATININE 1.46* 03/05/2014 0935      Component Value Date/Time   CALCIUM 8.4* 09/02/2015 0937   CALCIUM 9.5 03/05/2014 0935   ALKPHOS 63 09/02/2015 0937   ALKPHOS 72 02/02/2014 1454   AST 23 09/02/2015 0937   AST 35 02/02/2014 1454   ALT 14 09/02/2015 0937   ALT 27 02/02/2014 1454   BILITOT 0.4 09/02/2015 0937   BILITOT 0.2 02/02/2014 1454       RADIOGRAPHIC STUDIES: I have personally reviewed the radiological images as listed and agreed with the findings in the report. No  results found.   ASSESSMENT & PLAN:  Kidney cancer, primary, with metastasis from kidney to other site [facility]) # Iron deficiency anemia- unclear etiology ; repeat EGD colonoscopy negative. stable post PRBC transfusion IV iron June 2017; today hemoglobin 11.2. Recommend checking- iron studies today; and if needed IV iron. Patient will continue to be on by mouth iron.  Will also check labs/10 days prior to surgery [neck lipoma];   # RCC- clear-cell type; status post right nephrectomy; incidental focus of metastases in the right thyroid gland status post r thyroidectomy. Continue surveillance at this time.  # Colon ca- stage I- no evidence of recurrence.  # Cirrhosis PETscan- question Right hepatic lobe nodular lesion; recommend MRI of the liver prior to next visit in 2 months.  # Lipoma of the neck- awaiting resection in few weeks from now. Given her hemoglobin 11.2 today; patient should be at low risk for her neck surgery from anemia standpoint. We will also recheck her labs 10 days from now.    I reviewed the images myself and with the patient and family in detail. A copy of this report was given.    Orders Placed This Encounter  Procedures  . MR Liver W Wo Contrast    Standing Status: Future     Number of Occurrences:      Standing Expiration Date: 11/01/2016    Order Specific Question:  If indicated for the ordered procedure, I authorize the administration of contrast media per Radiology protocol    Answer:  Yes    Order Specific Question:  Reason for Exam (SYMPTOM  OR DIAGNOSIS REQUIRED)    Answer:  cirrhosis/ liver nodule    Order Specific Question:  Preferred imaging location?    Answer:  [facility] (table limit-300lbs)    Order Specific Question:  What is the patient's sedation requirement?    Answer:  No Sedation    Order Specific Question:  Does the patient have a pacemaker or implanted  devices?    Answer:  No   All questions were answered. The patient knows to call the  clinic with any problems, questions or concerns.      Cammie Sickle, MD 09/02/2015 6:34 PM

## 2015-09-02 NOTE — Progress Notes (Signed)
Patient had colonoscopy yesterday.

## 2015-09-07 ENCOUNTER — Encounter
Admission: RE | Admit: 2015-09-07 | Discharge: 2015-09-07 | Disposition: A | Discharge status: Home / Self Care | Payer: Medicare Other | Source: Ambulatory Visit | Attending: Unknown Physician Specialty | Admitting: Unknown Physician Specialty

## 2015-09-07 DIAGNOSIS — Z01810 Encounter for preprocedural cardiovascular examination: Secondary | ICD-10-CM | POA: Insufficient documentation

## 2015-09-07 HISTORY — DX: Family history of other specified conditions: Z84.89

## 2015-09-07 NOTE — Patient Instructions (Signed)
Your procedure is scheduled on: Tuesday 09/20/15 Report to Day Surgery. 2ND FLOOR MEDICAL MALL ENTRANCE To find out your arrival time please call [phone] between 1PM - 3PM on Monday 09/19/15.  Remember: Instructions that are not followed completely may result in serious medical risk, up to and including death, or upon the discretion of your surgeon and anesthesiologist your surgery may need to be rescheduled.    __X__ 1. Do not eat food or drink liquids after midnight. No gum chewing or hard candies.     __X__ 2. No Alcohol for 24 hours before or after surgery.   ____ 3. Bring all medications with you on the day of surgery if instructed.    __X__ 4. Notify your doctor if there is any change in your medical condition     (cold, fever, infections).     Do not wear jewelry, make-up, hairpins, clips or nail polish.  Do not wear lotions, powders, or perfumes.   Do not shave 48 hours prior to surgery. Men may shave face and neck.  Do not bring valuables to the hospital.    [facility] is not responsible for any belongings or valuables.               Contacts, dentures or bridgework may not be worn into surgery.  Leave your suitcase in the car. After surgery it may be brought to your room.  For patients admitted to the hospital, discharge time is determined by your                treatment team.   Patients discharged the day of surgery will not be allowed to drive home.   Please read over the following fact sheets that you were given:   Surgical Site Infection Prevention   __X__ Take these medicines the morning of surgery with A SIP OF WATER:    1. AMLODIPINE  2. CITALOPRAM  3. LEVOTHYROXINE  4. NEBIVOLOL  5. MAY TAKE HYDROCODONE FOR PAIN IF NEEDED  6.  ____ Fleet Enema (as directed)   __X__ Use CHG Soap as directed  ____ Use inhalers on the day of surgery  __X__ Stop metformin 2 days prior to surgery    ____ Take 1/2 of usual insulin dose the night before surgery and none  on the morning of surgery.   ____ Stop Coumadin/Plavix/aspirin on   ____ Stop Anti-inflammatories on    __X__ Stop supplements until after surgery.  HOLD B12 FOR 7 DAYS PRIOR TO SURGERY  ____ Bring C-Pap to the hospital.

## 2015-09-07 NOTE — Pre-Procedure Instructions (Addendum)
CLEARED BY ONCOLOGIST- REPEAT LABS SCHEDULED 09/12/15

## 2015-09-09 MED ORDER — SODIUM CHLORIDE 0.9% FLUSH: 10.0000 mL | INTRAVENOUS | Status: DC | PRN

## 2015-09-09 MED ORDER — SODIUM CHLORIDE 0.9% FLUSH: 3.0000 mL | INTRAVENOUS | Status: DC | PRN

## 2015-09-09 MED ORDER — HEPARIN SOD (PORK) LOCK FLUSH 100 UNIT/ML IV SOLN: 250.0000 [IU] | INTRAVENOUS | Status: DC | PRN

## 2015-09-09 MED ORDER — HEPARIN SOD (PORK) LOCK FLUSH 100 UNIT/ML IV SOLN: 500.0000 [IU] | Freq: Every day | INTRAVENOUS | Status: DC | PRN

## 2015-09-12 ENCOUNTER — Telehealth: Payer: Self-pay | Admitting: *Deleted

## 2015-09-12 ENCOUNTER — Telehealth: Payer: Self-pay | Admitting: Internal Medicine

## 2015-09-12 ENCOUNTER — Other Ambulatory Visit: Payer: Self-pay | Admitting: Internal Medicine

## 2015-09-12 ENCOUNTER — Inpatient Hospital Stay: Payer: Medicare Other

## 2015-09-12 DIAGNOSIS — C641 Malignant neoplasm of right kidney, except renal pelvis: Secondary | ICD-10-CM

## 2015-09-12 DIAGNOSIS — C189 Malignant neoplasm of colon, unspecified: Secondary | ICD-10-CM

## 2015-09-12 DIAGNOSIS — D638 Anemia in other chronic diseases classified elsewhere: Secondary | ICD-10-CM

## 2015-09-12 LAB — IRON AND TIBC
IRON: 23 ug/dL — AB (ref 28–170)
SATURATION RATIOS: 6 % — AB (ref 10.4–31.8)
TIBC: 415 ug/dL (ref 250–450)
UIBC: 392 ug/dL

## 2015-09-12 LAB — CBC WITH DIFFERENTIAL/PLATELET
Basophils Absolute: 0.1 10*3/uL (ref 0–0.1)
Basophils Relative: 1 %
Eosinophils Absolute: 0.1 10*3/uL (ref 0–0.7)
Eosinophils Relative: 2 %
HEMATOCRIT: 32 % — AB (ref 35.0–47.0)
HEMOGLOBIN: 10.5 g/dL — AB (ref 12.0–16.0)
Lymphocytes Relative: 12 %
Lymphs Abs: 1.1 10*3/uL (ref 1.0–3.6)
MCH: 27.9 pg (ref 26.0–34.0)
MCHC: 32.8 g/dL (ref 32.0–36.0)
MCV: 85 fL (ref 80.0–100.0)
MONOS PCT: 4 %
Monocytes Absolute: 0.4 10*3/uL (ref 0.2–0.9)
NEUTROS PCT: 81 %
Neutro Abs: 7.9 10*3/uL — ABNORMAL HIGH (ref 1.4–6.5)
Platelets: 198 10*3/uL (ref 150–440)
RBC: 3.76 MIL/uL — AB (ref 3.80–5.20)
RDW: 19 % — ABNORMAL HIGH (ref 11.5–14.5)
WBC: 9.7 10*3/uL (ref 3.6–11.0)

## 2015-09-12 LAB — FERRITIN: FERRITIN: 20 ng/mL (ref 11–307)

## 2015-09-12 NOTE — Telephone Encounter (Signed)
Rebecca Kirby in Triage already spoke to patient's daughter.

## 2015-09-12 NOTE — Telephone Encounter (Signed)
-----   Message from Cammie Sickle, MD sent at 09/12/2015 12:54 PM EDT ----- Needs IV ferrheam x2 weekly; start ASAP- Thx

## 2015-09-12 NOTE — Telephone Encounter (Signed)
Rebecca Kirby, please let the patient know that she will receive a call from scheduling. Iron sats at 6.

## 2015-09-12 NOTE — Telephone Encounter (Signed)
Pt needs IV iron/Ferrahem weekly x2; starting ASAP- pt planning surgery on 7/25 [next Tuesday].   Heather- please have the iron scheduled ASAP- Thanks.

## 2015-09-13 NOTE — Pre-Procedure Instructions (Signed)
CBC from 09/12/15 and Met B from 09/02/15 sent to Dr. Tami Ribas and Anesthesia for review.

## 2015-09-13 NOTE — Pre-Procedure Instructions (Addendum)
LM FOR PATIENT TO RETURN CALL RE GLU 09/02/15. SPOKE WITH DAUGHTER [name] NEESE WHO STATES PATIENT OFF OF DIABETIC MEDICATIONS FOR SEVERAL DAYS TO PREPARE FOOR COLONOSCOPY BEFORE LABS WITH GLU 364 DONE. SHE IS BACK ON MEDS AND SUGARS RUNNING 140 TO 160. [name] WILL MONITOR AND LET HER PCP DR SPARKS KNOW IF RISING ABOVE HER NORMAL. RECHECK ORDERED FOR AM SURGERY

## 2015-09-14 ENCOUNTER — Other Ambulatory Visit: Payer: Medicare Other

## 2015-09-14 ENCOUNTER — Ambulatory Visit: Payer: Medicare Other | Admitting: Oncology

## 2015-09-14 ENCOUNTER — Inpatient Hospital Stay: Payer: Medicare Other

## 2015-09-14 VITALS — BP 118/65 | HR 72 | Temp 97.2°F | Resp 18

## 2015-09-14 DIAGNOSIS — C641 Malignant neoplasm of right kidney, except renal pelvis: Secondary | ICD-10-CM | POA: Diagnosis not present

## 2015-09-14 DIAGNOSIS — D638 Anemia in other chronic diseases classified elsewhere: Secondary | ICD-10-CM

## 2015-09-14 DIAGNOSIS — D509 Iron deficiency anemia, unspecified: Secondary | ICD-10-CM

## 2015-09-14 MED ORDER — SODIUM CHLORIDE 0.9 % IV SOLN
510.0000 mg | Freq: Once | INTRAVENOUS | Status: AC
  Administered 2015-09-14: 510 mg via INTRAVENOUS
  Filled 2015-09-14: qty 17

## 2015-09-14 MED ORDER — SODIUM CHLORIDE 0.9 % IV SOLN
Freq: Once | INTRAVENOUS | Status: AC
  Administered 2015-09-14: 20 mL/h via INTRAVENOUS
  Filled 2015-09-14: qty 1000

## 2015-09-20 ENCOUNTER — Encounter
Admission: RE | Disposition: A | Discharge status: Home / Self Care | Payer: Self-pay | Source: Ambulatory Visit | Attending: Unknown Physician Specialty

## 2015-09-20 ENCOUNTER — Ambulatory Visit: Payer: Medicare Other | Admitting: Anesthesiology

## 2015-09-20 ENCOUNTER — Ambulatory Visit
Admission: RE | Admit: 2015-09-20 | Discharge: 2015-09-20 | Disposition: A | Discharge status: Home / Self Care | Payer: Medicare Other | Source: Ambulatory Visit | Attending: Unknown Physician Specialty | Admitting: Unknown Physician Specialty

## 2015-09-20 DIAGNOSIS — D509 Iron deficiency anemia, unspecified: Secondary | ICD-10-CM | POA: Insufficient documentation

## 2015-09-20 DIAGNOSIS — M1990 Unspecified osteoarthritis, unspecified site: Secondary | ICD-10-CM | POA: Diagnosis not present

## 2015-09-20 DIAGNOSIS — I10 Essential (primary) hypertension: Secondary | ICD-10-CM | POA: Diagnosis not present

## 2015-09-20 DIAGNOSIS — E119 Type 2 diabetes mellitus without complications: Secondary | ICD-10-CM | POA: Insufficient documentation

## 2015-09-20 DIAGNOSIS — F419 Anxiety disorder, unspecified: Secondary | ICD-10-CM | POA: Diagnosis not present

## 2015-09-20 DIAGNOSIS — D1779 Benign lipomatous neoplasm of other sites: Secondary | ICD-10-CM | POA: Diagnosis not present

## 2015-09-20 DIAGNOSIS — K7460 Unspecified cirrhosis of liver: Secondary | ICD-10-CM | POA: Insufficient documentation

## 2015-09-20 DIAGNOSIS — Z96652 Presence of left artificial knee joint: Secondary | ICD-10-CM | POA: Diagnosis not present

## 2015-09-20 DIAGNOSIS — Z87891 Personal history of nicotine dependence: Secondary | ICD-10-CM | POA: Diagnosis not present

## 2015-09-20 DIAGNOSIS — D638 Anemia in other chronic diseases classified elsewhere: Secondary | ICD-10-CM | POA: Diagnosis not present

## 2015-09-20 DIAGNOSIS — K219 Gastro-esophageal reflux disease without esophagitis: Secondary | ICD-10-CM | POA: Diagnosis not present

## 2015-09-20 DIAGNOSIS — E039 Hypothyroidism, unspecified: Secondary | ICD-10-CM | POA: Diagnosis not present

## 2015-09-20 DIAGNOSIS — K118 Other diseases of salivary glands: Secondary | ICD-10-CM | POA: Insufficient documentation

## 2015-09-20 DIAGNOSIS — D649 Anemia, unspecified: Secondary | ICD-10-CM

## 2015-09-20 DIAGNOSIS — L729 Follicular cyst of the skin and subcutaneous tissue, unspecified: Secondary | ICD-10-CM | POA: Diagnosis present

## 2015-09-20 HISTORY — PX: THYROGLOSSAL DUCT CYST: SHX297

## 2015-09-20 HISTORY — PX: EXCISION MASS NECK: SHX6703

## 2015-09-20 LAB — GLUCOSE, CAPILLARY
GLUCOSE-CAPILLARY: 169 mg/dL — AB (ref 65–99)
Glucose-Capillary: 156 mg/dL — ABNORMAL HIGH (ref 65–99)

## 2015-09-20 SURGERY — EXCISION, MASS, NECK
Anesthesia: General | Site: Neck | Laterality: Right | Wound class: Clean

## 2015-09-20 MED ORDER — ONDANSETRON HCL 4 MG/2ML IJ SOLN
INTRAMUSCULAR | Status: DC | PRN
Start: 2015-09-20 — End: 2015-09-20
  Administered 2015-09-20: 4 mg via INTRAVENOUS

## 2015-09-20 MED ORDER — FENTANYL CITRATE (PF) 100 MCG/2ML IJ SOLN: 25.0000 ug | INTRAMUSCULAR | Status: DC | PRN

## 2015-09-20 MED ORDER — FENTANYL CITRATE (PF) 100 MCG/2ML IJ SOLN
INTRAMUSCULAR | Status: DC | PRN
  Administered 2015-09-20 (×2): 50 ug via INTRAVENOUS

## 2015-09-20 MED ORDER — OXYCODONE-ACETAMINOPHEN 5-325 MG PO TABS: 1.0000 | ORAL_TABLET | ORAL | 0 refills | Status: DC | PRN

## 2015-09-20 MED ORDER — SODIUM CHLORIDE 0.9 % IV SOLN: INTRAVENOUS | Status: DC

## 2015-09-20 MED ORDER — LACTATED RINGERS IV SOLN
INTRAVENOUS | Status: DC
  Administered 2015-09-20: 07:00:00 via INTRAVENOUS

## 2015-09-20 MED ORDER — PROPOFOL 10 MG/ML IV BOLUS
INTRAVENOUS | Status: DC | PRN
  Administered 2015-09-20 (×4): 50 mg via INTRAVENOUS
  Administered 2015-09-20: 100 mg via INTRAVENOUS

## 2015-09-20 MED ORDER — PHENYLEPHRINE HCL 10 MG/ML IJ SOLN
INTRAMUSCULAR | Status: DC | PRN
  Administered 2015-09-20 (×7): 100 ug via INTRAVENOUS

## 2015-09-20 MED ORDER — LIDOCAINE-EPINEPHRINE 1 %-1:100000 IJ SOLN
INTRAMUSCULAR | Status: DC | PRN
Start: 2015-09-20 — End: 2015-09-20
  Administered 2015-09-20: 6 mL

## 2015-09-20 MED ORDER — DEXAMETHASONE SODIUM PHOSPHATE 4 MG/ML IJ SOLN
INTRAMUSCULAR | Status: DC | PRN
  Administered 2015-09-20: 4 mg via INTRAVENOUS

## 2015-09-20 MED ORDER — LIDOCAINE HCL (CARDIAC) 20 MG/ML IV SOLN
INTRAVENOUS | Status: DC | PRN
  Administered 2015-09-20: 30 mg via INTRAVENOUS

## 2015-09-20 MED ORDER — SUCCINYLCHOLINE CHLORIDE 20 MG/ML IJ SOLN
INTRAMUSCULAR | Status: DC | PRN
  Administered 2015-09-20: 80 mg via INTRAVENOUS

## 2015-09-20 MED ORDER — CEPHALEXIN 500 MG PO CAPS: 500.0000 mg | ORAL_CAPSULE | Freq: Two times a day (BID) | ORAL | 0 refills | Status: DC

## 2015-09-20 SURGICAL SUPPLY — 42 items
APPLIER CLIP 11 MED OPEN (CLIP)
APPLIER CLIP 9.375 SM OPEN (CLIP)
BLADE SURG 15 STRL LF DISP TIS (BLADE) ×2 IMPLANT
BLADE SURG 15 STRL SS (BLADE) ×2
CANISTER SUCT 1200ML W/VALVE (MISCELLANEOUS) ×4 IMPLANT
CLIP APPLIE 11 MED OPEN (CLIP) IMPLANT
CLIP APPLIE 9.375 SM OPEN (CLIP) IMPLANT
CORD BIP STRL DISP 12FT (MISCELLANEOUS) ×4 IMPLANT
DRAIN TLS ROUND 10FR (DRAIN) IMPLANT
DRAPE MAG INST 16X20 L/F (DRAPES) IMPLANT
ELECT CAUTERY BLADE TIP 2.5 (TIP) ×4
ELECT REM PT RETURN 9FT ADLT (ELECTROSURGICAL) ×4
ELECTRODE CAUTERY BLDE TIP 2.5 (TIP) ×2 IMPLANT
ELECTRODE REM PT RTRN 9FT ADLT (ELECTROSURGICAL) ×2 IMPLANT
FORCEPS JEWEL BIP 4-3/4 STR (INSTRUMENTS) ×4 IMPLANT
GLOVE BIO SURGEON STRL SZ7.5 (GLOVE) ×4 IMPLANT
GOWN STRL REUS W/ TWL LRG LVL3 (GOWN DISPOSABLE) ×6 IMPLANT
GOWN STRL REUS W/TWL LRG LVL3 (GOWN DISPOSABLE) ×6
HARMONIC SCALPEL FOCUS (MISCELLANEOUS) ×4 IMPLANT
JACKSON PRATT 7MM (INSTRUMENTS) ×4 IMPLANT
LABEL OR SOLS (LABEL) ×4 IMPLANT
LIQUID BAND (GAUZE/BANDAGES/DRESSINGS) ×4 IMPLANT
NS IRRIG 500ML POUR BTL (IV SOLUTION) ×4 IMPLANT
PACK HEAD/NECK (MISCELLANEOUS) ×4 IMPLANT
RETRACTOR STAY HOOK 5MM (MISCELLANEOUS) IMPLANT
SPONGE XRAY 4X4 16PLY STRL (MISCELLANEOUS) ×4 IMPLANT
STAPLER SKIN PROX 35W (STAPLE) IMPLANT
SUCTION FRAZIER HANDLE 10FR (MISCELLANEOUS) ×2
SUCTION TUBE FRAZIER 10FR DISP (MISCELLANEOUS) ×2 IMPLANT
SUT ETHILON 3-0 FS-10 30 BLK (SUTURE) ×4
SUT SILK 0 (SUTURE)
SUT SILK 0 30XBRD TIE 6 (SUTURE) IMPLANT
SUT SILK 2 0 (SUTURE) ×2
SUT SILK 2 0 SH (SUTURE) ×4 IMPLANT
SUT SILK 2-0 30XBRD TIE 12 (SUTURE) ×2 IMPLANT
SUT SILK 3 0 REEL (SUTURE) IMPLANT
SUT SILK 4 0 (SUTURE) ×2
SUT SILK 4-0 18XBRD TIE 12 (SUTURE) ×2 IMPLANT
SUT VIC AB 3-0 SH 27 (SUTURE)
SUT VIC AB 3-0 SH 27X BRD (SUTURE) IMPLANT
SUT VIC AB 4-0 RB1 18 (SUTURE) IMPLANT
SUTURE EHLN 3-0 FS-10 30 BLK (SUTURE) ×2 IMPLANT

## 2015-09-20 NOTE — Transfer of Care (Signed)
Immediate Anesthesia Transfer of Care Note  Patient: Rebecca Kirby  Procedure(s) Performed: Procedure(s): EXCISION MASS NECK (N/A) THYROGLOSSAL DUCT CYST (N/A)  Patient Location: PACU  Anesthesia Type: General  Level of Consciousness: awake, alert  and patient cooperative  Airway and Oxygen Therapy: Patient Spontanous Breathing and Patient connected to supplemental oxygen  Post-op Assessment: Post-op Vital signs reviewed, Patient's Cardiovascular Status Stable, Respiratory Function Stable, Patent Airway and No signs of Nausea or vomiting  Post-op Vital Signs: Reviewed and stable  Complications: No apparent anesthesia complications

## 2015-09-20 NOTE — Anesthesia Postprocedure Evaluation (Signed)
Anesthesia Post Note  Patient: Rebecca Kirby  Procedure(s) Performed: Procedure(s) (LRB): EXCISION MASS NECK (N/A) excision sub mandibular gland right (Right)  Patient location during evaluation: PACU Anesthesia Type: General Level of consciousness: awake and alert Pain management: pain level controlled Vital Signs Assessment: post-procedure vital signs reviewed and stable Respiratory status: spontaneous breathing, nonlabored ventilation, respiratory function stable and patient connected to nasal cannula oxygen Cardiovascular status: blood pressure returned to baseline and stable Postop Assessment: no signs of nausea or vomiting Anesthetic complications: no  Pt is doing very well.  No pain.  No labored breathing or airway concerns.  No complications.  Pt is leaving with her daughter who is a Marine scientist.  They know to contact the surgical center or go to the ED with any change in status. Stable for discharge.  Austan Nicholl C

## 2015-09-20 NOTE — Discharge Instructions (Signed)

## 2015-09-20 NOTE — Op Note (Signed)
09/20/2015  9:08 AM    Rebecca Kirby  AP:[id number]   Pre-Op Dx: NECK CYST  Post-op Dx: SAME  Proc: Excision of right deep cervical neck mass and right submandibular gland   Surg:  Beverly Gust Kirby  Anes:  GOT  EBL:  Less than 10 cc  Comp:  None  Findings:  Large approximately 6 cm lipomatous mass and firmness within the underlying submandibular gland  Procedure: Rebecca Kirby was identified in the holding area taken the operating room placed in supine position. After general endotracheal anesthesia the neck was prepped and draped sterilely an obvious mass was identified to the right of midline. A local anesthetic of 1% lidocaine with 1 100,000 units of epinephrine was used to inject overlying the mass a total of 7 cc was used. With the neck prepped and draped sterilely a 15 blade was used to incise down to and through the platysma muscle. Hemostasis was achieved using the Bovie cautery. Beneath the platysma layer lipomatous mass could be identified this was dissected free from the underlying tissue in all planes. There are multiple small feeding vessels which were divided using the Harmonic scalpel. The mass was removed intact and the capsule remained intact. Once this mass was removed re-expiration the neck showed a firm mass which had been located beneath this which was felt to be a firmness within the right ptotic submandibular gland. Was also an overlying lymph node which appeared to be enlarged. We felt at this time it was safest to remove the gland therefore was dissected free from the surrounding tissues the mylohyoid muscle was identified and retracted superiorly. The lingual nerve was identified and the submandibular ganglion was divided using the Harmonic scalpel allowing the lingual nerve to retract superiorly. The submandibular duct was traced as it went beneath the mylohyoid muscle and was divided using the Harmonic scalpel. The gland was then dissected posteriorly where the artery  and vein were identified and these were gently dissected clamped on each side and divided and suture ligated using 2-0 silk suture ligatures. The gland and one lymph node overlying the gland removed in their entirety. The wounds and copiously irrigated with saline there is no evidence of active bleeding. A #7 Jackson-Pratt drain was brought out of the wound inferiorly. The platysma layer was closed using interrupted 4-0 Vicryl the subcutaneous taste tissues were closed using 4-0 Vicryl and the skin was closed using Dermabond. The Jackson-Pratt was sutured in place using a 2-0 silk. The patient was a return anesthesia where she was awakened in the operating room taken recovery room in stable condition. Cultures: None  Specimens: Right neck lipomatous mass and right submandibular gland     Dispo:   Good  Plan:  Discharged home with instructions to empty Jackson-Pratt drain every 8 hours follow-up in my office in 2 days  Rebecca Kirby  09/20/2015 9:08 AM

## 2015-09-20 NOTE — H&P (Signed)
  H+P  Reviewed and will be scanned in later. No changes noted. 

## 2015-09-20 NOTE — Anesthesia Preprocedure Evaluation (Addendum)
Anesthesia Evaluation    Airway Mallampati: II  TM Distance: >3 FB Neck ROM: Full   Comment: No CT available for review, however surgeon has performed thyroidectomy on patient and believes that there should be no airmway difficulty.  No difficulty intubating with thyroidectomy Dental no notable dental hx.    Pulmonary neg pulmonary ROS, former smoker,    Pulmonary exam normal breath sounds clear to auscultation       Cardiovascular hypertension, Normal cardiovascular exam Rhythm:Regular Rate:Normal  Recent EKG within normal limits SVT with previous surgery.  No post op issues   Neuro/Psych Anxiety negative neurological ROS     GI/Hepatic GERD  ,(+) Cirrhosis       , Cirrhosis of liver albumin currently 4.0.. Total protein 7.1. Cirrhosis of liver albumin currently 4.0.   Endo/Other  diabetes, Poorly Controlled, Type 2Hypothyroidism Glucose 169 morning of surgery in preop. S/p thyroidectomy  Renal/GU Renal diseaseS/p right nephrectomy.  Cr 1.21. GFR 44     Musculoskeletal  (+) Arthritis ,   Abdominal No ascites  Peds negative pediatric ROS (+)  Hematology  (+) anemia , Iron deficiency and anemia of chronic disease.  Pt was transfused by Heme/Onc preoperatively   Anesthesia Other Findings No ansarca.  Left leg swelling which has been persistent since left knee replacement.  Large anterior cervical mass.  Reproductive/Obstetrics                        Anesthesia Physical Anesthesia Plan  ASA: III  Anesthesia Plan: General   Post-op Pain Management:    Induction: Intravenous  Airway Management Planned: Oral ETT  Additional Equipment:   Intra-op Plan:   Post-operative Plan: Extubation in OR  Informed Consent: I have reviewed the patients History and Physical, chart, labs and discussed the procedure including the risks, benefits and alternatives for the proposed anesthesia with the patient  or authorized representative who has indicated his/her understanding and acceptance.   Dental advisory given  Plan Discussed with: CRNA  Anesthesia Plan Comments:         Anesthesia Quick Evaluation

## 2015-09-20 NOTE — Anesthesia Procedure Notes (Addendum)
Procedure Name: Intubation Date/Time: 09/20/2015 8:04 AM Performed by: Londell Moh Pre-anesthesia Checklist: Patient identified, Emergency Drugs available, Suction available, Patient being monitored and Timeout performed Patient Re-evaluated:Patient Re-evaluated prior to inductionOxygen Delivery Method: Circle system utilized Preoxygenation: Pre-oxygenation with 100% oxygen Intubation Type: IV induction Ventilation: Mask ventilation without difficulty and Oral airway inserted - appropriate to patient size Laryngoscope Size: Glidescope and 3 Grade View: Grade I Tube type: Oral Tube size: 6.5 mm Number of attempts: 1 Airway Equipment and Method: Rigid stylet Placement Confirmation: ETT inserted through vocal cords under direct vision,  positive ETCO2 and breath sounds checked- equal and bilateral Secured at: 19 cm Tube secured with: Tape Dental Injury: Teeth and Oropharynx as per pre-operative assessment  Comments: Easy intubation with glide scope. Pt with friable tissue in oral mucosa. Small amount of bleeding noted with placement of oral airway.

## 2015-09-21 ENCOUNTER — Encounter: Payer: Self-pay | Admitting: Unknown Physician Specialty

## 2015-09-21 ENCOUNTER — Inpatient Hospital Stay: Payer: Medicare Other

## 2015-09-21 ENCOUNTER — Other Ambulatory Visit: Payer: Self-pay | Admitting: *Deleted

## 2015-09-21 DIAGNOSIS — D509 Iron deficiency anemia, unspecified: Secondary | ICD-10-CM

## 2015-09-21 DIAGNOSIS — C641 Malignant neoplasm of right kidney, except renal pelvis: Secondary | ICD-10-CM | POA: Diagnosis not present

## 2015-09-21 DIAGNOSIS — D638 Anemia in other chronic diseases classified elsewhere: Secondary | ICD-10-CM

## 2015-09-21 MED ORDER — SODIUM CHLORIDE 0.9 % IV SOLN
Freq: Once | INTRAVENOUS | Status: AC
  Administered 2015-09-21: 14:00:00 via INTRAVENOUS
  Filled 2015-09-21: qty 1000

## 2015-09-21 MED ORDER — SODIUM CHLORIDE 0.9 % IV SOLN
510.0000 mg | Freq: Once | INTRAVENOUS | Status: AC
  Administered 2015-09-21: 510 mg via INTRAVENOUS
  Filled 2015-09-21: qty 17

## 2015-09-22 ENCOUNTER — Ambulatory Visit
Admission: RE | Admit: 2015-09-22 | Discharge: 2015-09-22 | Disposition: A | Discharge status: Home / Self Care | Payer: Medicare Other | Source: Ambulatory Visit | Attending: Internal Medicine | Admitting: Internal Medicine

## 2015-09-22 ENCOUNTER — Other Ambulatory Visit: Payer: Self-pay | Admitting: Internal Medicine

## 2015-09-22 DIAGNOSIS — R29810 Facial weakness: Secondary | ICD-10-CM

## 2015-09-22 DIAGNOSIS — I6782 Cerebral ischemia: Secondary | ICD-10-CM | POA: Insufficient documentation

## 2015-09-22 LAB — SURGICAL PATHOLOGY

## 2015-10-12 ENCOUNTER — Other Ambulatory Visit: Payer: Self-pay | Admitting: Internal Medicine

## 2015-10-12 DIAGNOSIS — Z1231 Encounter for screening mammogram for malignant neoplasm of breast: Secondary | ICD-10-CM

## 2015-10-26 ENCOUNTER — Other Ambulatory Visit: Payer: Self-pay | Admitting: Internal Medicine

## 2015-10-26 ENCOUNTER — Ambulatory Visit
Admission: RE | Admit: 2015-10-26 | Discharge: 2015-10-26 | Disposition: A | Discharge status: Home / Self Care | Payer: Medicare Other | Source: Ambulatory Visit | Attending: Internal Medicine | Admitting: Internal Medicine

## 2015-10-26 DIAGNOSIS — R921 Mammographic calcification found on diagnostic imaging of breast: Secondary | ICD-10-CM | POA: Diagnosis not present

## 2015-10-26 DIAGNOSIS — Z1231 Encounter for screening mammogram for malignant neoplasm of breast: Secondary | ICD-10-CM | POA: Insufficient documentation

## 2015-10-27 ENCOUNTER — Ambulatory Visit
Admission: RE | Admit: 2015-10-27 | Discharge: 2015-10-27 | Disposition: A | Discharge status: Home / Self Care | Payer: Medicare Other | Source: Ambulatory Visit | Attending: Internal Medicine | Admitting: Internal Medicine

## 2015-10-27 DIAGNOSIS — Z905 Acquired absence of kidney: Secondary | ICD-10-CM | POA: Insufficient documentation

## 2015-10-27 DIAGNOSIS — K766 Portal hypertension: Secondary | ICD-10-CM | POA: Diagnosis not present

## 2015-10-27 DIAGNOSIS — K7460 Unspecified cirrhosis of liver: Secondary | ICD-10-CM | POA: Diagnosis not present

## 2015-10-27 MED ORDER — GADOXETATE DISODIUM 0.25 MMOL/ML IV SOLN
10.0000 mL | Freq: Once | INTRAVENOUS | Status: AC | PRN
  Administered 2015-10-27: 9 mL via INTRAVENOUS

## 2015-11-01 ENCOUNTER — Other Ambulatory Visit: Payer: Self-pay | Admitting: Internal Medicine

## 2015-11-01 DIAGNOSIS — R921 Mammographic calcification found on diagnostic imaging of breast: Secondary | ICD-10-CM

## 2015-11-01 DIAGNOSIS — R928 Other abnormal and inconclusive findings on diagnostic imaging of breast: Secondary | ICD-10-CM

## 2015-11-03 ENCOUNTER — Inpatient Hospital Stay: Payer: Medicare Other | Attending: Internal Medicine

## 2015-11-03 ENCOUNTER — Inpatient Hospital Stay (HOSPITAL_BASED_OUTPATIENT_CLINIC_OR_DEPARTMENT_OTHER): Payer: Medicare Other | Admitting: Internal Medicine

## 2015-11-03 ENCOUNTER — Other Ambulatory Visit: Payer: Self-pay | Admitting: Internal Medicine

## 2015-11-03 DIAGNOSIS — R5383 Other fatigue: Secondary | ICD-10-CM | POA: Insufficient documentation

## 2015-11-03 DIAGNOSIS — K7460 Unspecified cirrhosis of liver: Secondary | ICD-10-CM

## 2015-11-03 DIAGNOSIS — Z7984 Long term (current) use of oral hypoglycemic drugs: Secondary | ICD-10-CM

## 2015-11-03 DIAGNOSIS — Z905 Acquired absence of kidney: Secondary | ICD-10-CM

## 2015-11-03 DIAGNOSIS — Z79899 Other long term (current) drug therapy: Secondary | ICD-10-CM | POA: Insufficient documentation

## 2015-11-03 DIAGNOSIS — Z85038 Personal history of other malignant neoplasm of large intestine: Secondary | ICD-10-CM | POA: Insufficient documentation

## 2015-11-03 DIAGNOSIS — Z803 Family history of malignant neoplasm of breast: Secondary | ICD-10-CM | POA: Diagnosis not present

## 2015-11-03 DIAGNOSIS — D170 Benign lipomatous neoplasm of skin and subcutaneous tissue of head, face and neck: Secondary | ICD-10-CM

## 2015-11-03 DIAGNOSIS — E119 Type 2 diabetes mellitus without complications: Secondary | ICD-10-CM

## 2015-11-03 DIAGNOSIS — K219 Gastro-esophageal reflux disease without esophagitis: Secondary | ICD-10-CM | POA: Diagnosis not present

## 2015-11-03 DIAGNOSIS — Z87891 Personal history of nicotine dependence: Secondary | ICD-10-CM | POA: Insufficient documentation

## 2015-11-03 DIAGNOSIS — F419 Anxiety disorder, unspecified: Secondary | ICD-10-CM

## 2015-11-03 DIAGNOSIS — E890 Postprocedural hypothyroidism: Secondary | ICD-10-CM | POA: Diagnosis not present

## 2015-11-03 DIAGNOSIS — E039 Hypothyroidism, unspecified: Secondary | ICD-10-CM | POA: Diagnosis not present

## 2015-11-03 DIAGNOSIS — C641 Malignant neoplasm of right kidney, except renal pelvis: Secondary | ICD-10-CM

## 2015-11-03 DIAGNOSIS — D509 Iron deficiency anemia, unspecified: Secondary | ICD-10-CM

## 2015-11-03 DIAGNOSIS — C7989 Secondary malignant neoplasm of other specified sites: Secondary | ICD-10-CM

## 2015-11-03 DIAGNOSIS — C189 Malignant neoplasm of colon, unspecified: Secondary | ICD-10-CM

## 2015-11-03 DIAGNOSIS — D638 Anemia in other chronic diseases classified elsewhere: Secondary | ICD-10-CM

## 2015-11-03 LAB — COMPREHENSIVE METABOLIC PANEL
ALK PHOS: 51 U/L (ref 38–126)
ALT: 13 U/L — ABNORMAL LOW (ref 14–54)
ANION GAP: 7 (ref 5–15)
AST: 18 U/L (ref 15–41)
Albumin: 3.9 g/dL (ref 3.5–5.0)
BILIRUBIN TOTAL: 0.4 mg/dL (ref 0.3–1.2)
BUN: 22 mg/dL — ABNORMAL HIGH (ref 6–20)
CALCIUM: 8.6 mg/dL — AB (ref 8.9–10.3)
CO2: 24 mmol/L (ref 22–32)
Chloride: 104 mmol/L (ref 101–111)
Creatinine, Ser: 1.13 mg/dL — ABNORMAL HIGH (ref 0.44–1.00)
GFR calc non Af Amer: 47 mL/min — ABNORMAL LOW (ref >60)
GFR, EST AFRICAN AMERICAN: 55 mL/min — AB (ref >60)
GLUCOSE: 137 mg/dL — AB (ref 65–99)
Potassium: 5.2 mmol/L — ABNORMAL HIGH (ref 3.5–5.1)
Sodium: 135 mmol/L (ref 135–145)
TOTAL PROTEIN: 6.9 g/dL (ref 6.5–8.1)

## 2015-11-03 LAB — CBC WITH DIFFERENTIAL/PLATELET
BASOS ABS: 0 10*3/uL (ref 0–0.1)
BASOS PCT: 1 %
EOS PCT: 2 %
Eosinophils Absolute: 0.1 10*3/uL (ref 0–0.7)
HCT: 32.5 % — ABNORMAL LOW (ref 35.0–47.0)
Hemoglobin: 10.9 g/dL — ABNORMAL LOW (ref 12.0–16.0)
Lymphocytes Relative: 10 %
Lymphs Abs: 0.8 10*3/uL — ABNORMAL LOW (ref 1.0–3.6)
MCH: 29.8 pg (ref 26.0–34.0)
MCHC: 33.6 g/dL (ref 32.0–36.0)
MCV: 88.7 fL (ref 80.0–100.0)
MONO ABS: 0.3 10*3/uL (ref 0.2–0.9)
Monocytes Relative: 4 %
Neutro Abs: 6.7 10*3/uL — ABNORMAL HIGH (ref 1.4–6.5)
Neutrophils Relative %: 83 %
PLATELETS: 177 10*3/uL (ref 150–440)
RBC: 3.66 MIL/uL — ABNORMAL LOW (ref 3.80–5.20)
RDW: 16.5 % — AB (ref 11.5–14.5)
WBC: 8 10*3/uL (ref 3.6–11.0)

## 2015-11-03 LAB — IRON AND TIBC
IRON: 37 ug/dL (ref 28–170)
Saturation Ratios: 11 % (ref 10.4–31.8)
TIBC: 350 ug/dL (ref 250–450)
UIBC: 313 ug/dL

## 2015-11-03 LAB — FERRITIN: Ferritin: 46 ng/mL (ref 11–307)

## 2015-11-03 NOTE — Progress Notes (Signed)
Patient here for routine f/u for thyroid cancer. She has no complaints today. She is here to discuss MRI results.

## 2015-11-03 NOTE — Assessment & Plan Note (Addendum)
#   Iron deficiency anemia- unclear etiology; Hb-10.5. Iron studies are pending. If low would recommend IV iron/likely need.  # RCC- clear-cell type; status post right nephrectomy; incidental focus of metastases in the right thyroid gland status post r thyroidectomy. Continue surveillance at this time.  # Colon ca- stage I- no evidence of recurrence.  # Cirrhosis PETscan- question Right hepatic lobe nodular lesion; MRI-NEG.  # Lipoma of the neck/sp resection-reviewed path.    I reviewed the images myself and with the patient and family in detail. A copy of this report was given.  Ph: [phone] donna/ daughter Aletha Halim call re: IV iron]. Follow-up in approximately 3 months with labs possible IV iron; labs 1 week prior.

## 2015-11-03 NOTE — Progress Notes (Signed)
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 McQueen] Incidetal- 0.71mm focus of RCC in R thyroid lobe; Multifocal benign follicular adenomatous nodules   # Iron deficiency anemia requiring blood transfusion and intravenous iron therapy (May of 2017)  # Cirrhosis of liver [PET June 2017]- AUG 31st 2017- MRI liver-cirrhosis/no lesions.      Kidney cancer, primary, with metastasis from kidney to other site [facility])   08/26/2014 Initial Diagnosis    Kidney cancer, primary, with metastasis from kidney to other site [facility])       Cancer of right kidney ([place])   11/03/2015 Initial Diagnosis    Cancer of right kidney ([place])      INTERVAL HISTORY:    Rebecca Kirby 70 y.o.  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   In the interim patient had lump under the neck excised. Surgery was uneventful.  Complains of mild-to-moderate fatigue. No new shortness of breath or chest pain. Her energy levels have improved post IV iron.. No blood in stools black stools   REVIEW OF SYSTEMS:  A complete 10 point review of system is done which is negative except mentioned above/history of present illness.   PAST MEDICAL HISTORY :  Past Medical History:  Diagnosis Date  . Anemia   . Anxiety   . Colon cancer ([place])   . Diabetes mellitus without complication ([place])   . Family history of adverse reaction to anesthesia   . GERD (gastroesophageal reflux disease)   . Hypertension   . Hypothyroidism   . Kidney cancer, primary, with metastasis from kidney to other site [facility])  08/26/2014  . Thyroid cancer ([place])     PAST SURGICAL HISTORY :   Past Surgical History:  Procedure Laterality Date  . CHOLECYSTECTOMY    . COLON RESECTION    . COLONOSCOPY WITH PROPOFOL N/A 09/01/2015   Procedure: COLONOSCOPY WITH PROPOFOL;  Surgeon: Manya Silvas, MD;  Location[facility] ENDOSCOPY;  Service: Endoscopy;  Laterality: N/A;  . ESOPHAGOGASTRODUODENOSCOPY (EGD) WITH PROPOFOL N/A 09/01/2015   Procedure: ESOPHAGOGASTRODUODENOSCOPY (EGD) WITH PROPOFOL;  Surgeon: Manya Silvas, MD;  Location: [facility] ENDOSCOPY;  Service: Endoscopy;  Laterality: N/A;  . EXCISION MASS NECK N/A 09/20/2015   Procedure: EXCISION MASS NECK;  Surgeon: Beverly Gust, MD;  Location: [place];  Service: ENT;  Laterality: N/A;  . rt kidney removed Right   . THYROGLOSSAL DUCT CYST Right 09/20/2015   Procedure: excision sub mandibular gland right;  Surgeon: Beverly Gust, MD;  Location: [place];  Service: ENT;  Laterality: Right;  . THYROID SURGERY Bilateral   . TOTAL KNEE ARTHROPLASTY Right   . TOTAL KNEE ARTHROPLASTY Left 08/09/2014   Procedure: TOTAL KNEE ARTHROPLASTY;  Surgeon: Dereck Leep, MD;  Location: [facility] ORS;  Service: Orthopedics;  Laterality: Left;    FAMILY HISTORY :   Family History  Problem Relation Age of Onset  . Breast cancer Maternal Aunt   . Breast cancer Cousin     SOCIAL HISTORY:   Social History  Substance Use Topics  . Smoking  status: Former Smoker    Quit date: 07/29/1994  . Smokeless tobacco: Never Used  . Alcohol use No    ALLERGIES:  has No Known Allergies.  MEDICATIONS:  Current Outpatient Prescriptions  Medication Sig Dispense Refill  . amLODipine-olmesartan (AZOR) 5-20 MG per tablet Take 1 tablet by mouth daily.    . calcium acetate (PHOSLO) 667 MG capsule Take 667 mg by mouth 2 (two) times daily.     . citalopram (CELEXA) 40 MG tablet Take 1 tablet by mouth  daily 90 tablet 3  . Fe Fum-FePoly-FA-Vit C-Vit B3 (FOLIVANE-F) 125-1 MG CAPS Take  1 capsule by mouth 2 (two) times daily.    Marland Kitchen glipiZIDE (GLUCOTROL) 10 MG tablet Take 10 mg by mouth 2 (two) times daily before a meal.    . levothyroxine (SYNTHROID, LEVOTHROID) 150 MCG tablet Take 150 mcg by mouth daily before breakfast.    . metFORMIN (GLUCOPHAGE) 1000 MG tablet Take 1,000 mg by mouth 2 (two) times daily with a meal.    . nebivolol (BYSTOLIC) 5 MG tablet Take 5 mg by mouth daily.    . pantoprazole (PROTONIX) 40 MG tablet Take 40 mg by mouth daily.    . pioglitazone (ACTOS) 30 MG tablet Take 30 mg by mouth daily.    . sitaGLIPtin (JANUVIA) 100 MG tablet Take 100 mg by mouth daily.    . vitamin B-12 (CYANOCOBALAMIN) 1000 MCG tablet Take 1,000 mcg by mouth daily. Reported on 07/07/2015    . Cholecalciferol (VITAMIN D-3) 1000 UNITS CAPS Take 1 capsule by mouth daily.    Marland Kitchen HYDROcodone-acetaminophen (NORCO) 10-325 MG tablet Take 1 tablet by mouth every 6 (six) hours as needed for moderate pain.      No current facility-administered medications for this visit.     PHYSICAL EXAMINATION: ECOG PERFORMANCE STATUS: 0 - Asymptomatic  BP (!) 157/76 (BP Location: Left Arm, Patient Position: Sitting)   Pulse 69   Temp 97.6 F (36.4 C) (Tympanic)   Resp 20   Ht 5\' 6"  (1.676 m)   Wt 202 lb (91.6 kg)   BMI 32.60 kg/m   Filed Weights   11/03/15 1020  Weight: 202 lb (91.6 kg)    GENERAL: Well-nourished well-developed; Alert, no distress and comfortable.  With her daughter Shelly Bombard.  EYES: no pallor or icterus OROPHARYNX: no thrush or ulceration; good dentition  NECK: supple, obvious neck mass noted in midline [lipoma] LYMPH:  no palpable lymphadenopathy in the cervical, axillary or inguinal regions LUNGS: clear to auscultation and  No wheeze or crackles HEART/CVS: regular rate & rhythm and no murmurs; No lower extremity edema ABDOMEN:abdomen soft, non-tender and normal bowel sounds Musculoskeletal:no cyanosis of digits and no clubbing  PSYCH: alert & oriented x 3 with fluent  speech NEURO: no focal motor/sensory deficits SKIN:  no rashes or significant lesions  LABORATORY DATA:  I have reviewed the data as listed    Component Value Date/Time   NA 135 11/03/2015 1006   NA 138 03/05/2014 0935   K 5.2 (H) 11/03/2015 1006   K 4.6 03/05/2014 0935   CL 104 11/03/2015 1006   CL 103 03/05/2014 0935   CO2 24 11/03/2015 1006   CO2 25 03/05/2014 0935   GLUCOSE 137 (H) 11/03/2015 1006   GLUCOSE 200 (H) 03/05/2014 0935   BUN 22 (H) 11/03/2015 1006   BUN 20 (H) 03/05/2014 0935   CREATININE 1.13 (H) 11/03/2015 1006   CREATININE 1.46 (H) 03/05/2014 0935   CALCIUM 8.6 (L) 11/03/2015 1006  CALCIUM 9.5 03/05/2014 0935   PROT 6.9 11/03/2015 1006   PROT 7.3 02/02/2014 1454   ALBUMIN 3.9 11/03/2015 1006   ALBUMIN 3.1 (L) 02/10/2014 0410   AST 18 11/03/2015 1006   AST 35 02/02/2014 1454   ALT 13 (L) 11/03/2015 1006   ALT 27 02/02/2014 1454   ALKPHOS 51 11/03/2015 1006   ALKPHOS 72 02/02/2014 1454   BILITOT 0.4 11/03/2015 1006   BILITOT 0.2 02/02/2014 1454   GFRNONAA 47 (L) 11/03/2015 1006   GFRNONAA 38 (L) 03/05/2014 0935   GFRNONAA 50 (L) 10/02/2013 1027   GFRAA 55 (L) 11/03/2015 1006   GFRAA 46 (L) 03/05/2014 0935   GFRAA 58 (L) 10/02/2013 1027    No results found for: SPEP, UPEP  Lab Results  Component Value Date   WBC 8.0 11/03/2015   NEUTROABS 6.7 (H) 11/03/2015   HGB 10.9 (L) 11/03/2015   HCT 32.5 (L) 11/03/2015   MCV 88.7 11/03/2015   PLT 177 11/03/2015      Chemistry      Component Value Date/Time   NA 135 11/03/2015 1006   NA 138 03/05/2014 0935   K 5.2 (H) 11/03/2015 1006   K 4.6 03/05/2014 0935   CL 104 11/03/2015 1006   CL 103 03/05/2014 0935   CO2 24 11/03/2015 1006   CO2 25 03/05/2014 0935   BUN 22 (H) 11/03/2015 1006   BUN 20 (H) 03/05/2014 0935   CREATININE 1.13 (H) 11/03/2015 1006   CREATININE 1.46 (H) 03/05/2014 0935      Component Value Date/Time   CALCIUM 8.6 (L) 11/03/2015 1006   CALCIUM 9.5 03/05/2014 0935    ALKPHOS 51 11/03/2015 1006   ALKPHOS 72 02/02/2014 1454   AST 18 11/03/2015 1006   AST 35 02/02/2014 1454   ALT 13 (L) 11/03/2015 1006   ALT 27 02/02/2014 1454   BILITOT 0.4 11/03/2015 1006   BILITOT 0.2 02/02/2014 1454       RADIOGRAPHIC STUDIES: I have personally reviewed the radiological images as listed and agreed with the findings in the report. No results found.   ASSESSMENT & PLAN:  Cancer of right kidney ([place]) # Iron deficiency anemia- unclear etiology; Hb-10.5. Iron studies are pending. If low would recommend IV iron/likely need.  # RCC- clear-cell type; status post right nephrectomy; incidental focus of metastases in the right thyroid gland status post r thyroidectomy. Continue surveillance at this time.  # Colon ca- stage I- no evidence of recurrence.  # Cirrhosis PETscan- question Right hepatic lobe nodular lesion; MRI-NEG.  # Lipoma of the neck/sp resection-reviewed path.    I reviewed the images myself and with the patient and family in detail. A copy of this report was given.  Ph: [phone] donna/ daughter Aletha Halim call re: IV iron]. Follow-up in approximately 3 months with labs possible IV iron; labs 1 week prior.   Orders Placed This Encounter  Procedures  . Lactate dehydrogenase    Standing Status:   Future    Standing Expiration Date:   11/02/2016  . Comprehensive metabolic panel    Standing Status:   Future    Standing Expiration Date:   11/02/2016   All questions were answered. The patient knows to call the clinic with any problems, questions or concerns.      Cammie Sickle, MD 11/03/2015 6:13 PM

## 2015-11-11 ENCOUNTER — Inpatient Hospital Stay: Payer: Medicare Other

## 2015-11-11 VITALS — BP 108/65 | HR 69 | Temp 97.0°F | Resp 20

## 2015-11-11 DIAGNOSIS — C641 Malignant neoplasm of right kidney, except renal pelvis: Secondary | ICD-10-CM | POA: Diagnosis not present

## 2015-11-11 DIAGNOSIS — D638 Anemia in other chronic diseases classified elsewhere: Secondary | ICD-10-CM

## 2015-11-11 DIAGNOSIS — D509 Iron deficiency anemia, unspecified: Secondary | ICD-10-CM

## 2015-11-11 MED ORDER — SODIUM CHLORIDE 0.9 % IV SOLN
510.0000 mg | Freq: Once | INTRAVENOUS | Status: AC
  Administered 2015-11-11: 510 mg via INTRAVENOUS
  Filled 2015-11-11: qty 17

## 2015-11-11 MED ORDER — SODIUM CHLORIDE 0.9 % IV SOLN
Freq: Once | INTRAVENOUS | Status: AC
  Administered 2015-11-11: 14:00:00 via INTRAVENOUS
  Filled 2015-11-11: qty 1000

## 2015-11-15 ENCOUNTER — Ambulatory Visit
Admission: RE | Admit: 2015-11-15 | Discharge: 2015-11-15 | Disposition: A | Discharge status: Home / Self Care | Payer: Medicare Other | Source: Ambulatory Visit | Attending: Internal Medicine | Admitting: Internal Medicine

## 2015-11-15 ENCOUNTER — Ambulatory Visit: Payer: Medicare Other

## 2015-11-15 ENCOUNTER — Emergency Department: Payer: Medicare Other

## 2015-11-15 DIAGNOSIS — R921 Mammographic calcification found on diagnostic imaging of breast: Secondary | ICD-10-CM

## 2015-11-15 DIAGNOSIS — R928 Other abnormal and inconclusive findings on diagnostic imaging of breast: Secondary | ICD-10-CM

## 2016-01-11 ENCOUNTER — Ambulatory Visit
Admission: RE | Admit: 2016-01-11 | Discharge: 2016-01-11 | Disposition: A | Discharge status: Home / Self Care | Payer: Medicare Other | Source: Ambulatory Visit | Attending: Internal Medicine | Admitting: Internal Medicine

## 2016-01-11 ENCOUNTER — Other Ambulatory Visit: Payer: Self-pay | Admitting: Internal Medicine

## 2016-01-11 DIAGNOSIS — M79662 Pain in left lower leg: Secondary | ICD-10-CM

## 2016-01-12 ENCOUNTER — Other Ambulatory Visit: Payer: Self-pay | Admitting: *Deleted

## 2016-01-26 ENCOUNTER — Inpatient Hospital Stay: Payer: Medicare Other | Attending: Internal Medicine

## 2016-01-26 DIAGNOSIS — D509 Iron deficiency anemia, unspecified: Secondary | ICD-10-CM | POA: Insufficient documentation

## 2016-01-26 DIAGNOSIS — C641 Malignant neoplasm of right kidney, except renal pelvis: Secondary | ICD-10-CM

## 2016-01-26 DIAGNOSIS — D638 Anemia in other chronic diseases classified elsewhere: Secondary | ICD-10-CM

## 2016-01-26 DIAGNOSIS — C189 Malignant neoplasm of colon, unspecified: Secondary | ICD-10-CM

## 2016-01-26 LAB — COMPREHENSIVE METABOLIC PANEL
ALBUMIN: 3.7 g/dL (ref 3.5–5.0)
ALT: 13 U/L — ABNORMAL LOW (ref 14–54)
AST: 22 U/L (ref 15–41)
Alkaline Phosphatase: 72 U/L (ref 38–126)
Anion gap: 10 (ref 5–15)
BUN: 18 mg/dL (ref 6–20)
CHLORIDE: 101 mmol/L (ref 101–111)
CO2: 25 mmol/L (ref 22–32)
Calcium: 8.7 mg/dL — ABNORMAL LOW (ref 8.9–10.3)
Creatinine, Ser: 1.23 mg/dL — ABNORMAL HIGH (ref 0.44–1.00)
GFR calc Af Amer: 50 mL/min — ABNORMAL LOW (ref >60)
GFR, EST NON AFRICAN AMERICAN: 43 mL/min — AB (ref >60)
GLUCOSE: 262 mg/dL — AB (ref 65–99)
POTASSIUM: 5.2 mmol/L — AB (ref 3.5–5.1)
SODIUM: 136 mmol/L (ref 135–145)
Total Bilirubin: 0.7 mg/dL (ref 0.3–1.2)
Total Protein: 7.1 g/dL (ref 6.5–8.1)

## 2016-01-26 LAB — IRON AND TIBC
IRON: 18 ug/dL — AB (ref 28–170)
Saturation Ratios: 4 % — ABNORMAL LOW (ref 10.4–31.8)
TIBC: 410 ug/dL (ref 250–450)
UIBC: 392 ug/dL

## 2016-01-26 LAB — CBC WITH DIFFERENTIAL/PLATELET
BASOS ABS: 0 10*3/uL (ref 0–0.1)
BASOS PCT: 0 %
EOS ABS: 0 10*3/uL (ref 0–0.7)
EOS PCT: 0 %
HCT: 29.9 % — ABNORMAL LOW (ref 35.0–47.0)
HEMOGLOBIN: 9.7 g/dL — AB (ref 12.0–16.0)
Lymphocytes Relative: 6 %
Lymphs Abs: 0.9 10*3/uL — ABNORMAL LOW (ref 1.0–3.6)
MCH: 27.5 pg (ref 26.0–34.0)
MCHC: 32.6 g/dL (ref 32.0–36.0)
MCV: 84.2 fL (ref 80.0–100.0)
Monocytes Absolute: 0.8 10*3/uL (ref 0.2–0.9)
Monocytes Relative: 6 %
NEUTROS PCT: 88 %
Neutro Abs: 11.8 10*3/uL — ABNORMAL HIGH (ref 1.4–6.5)
PLATELETS: 211 10*3/uL (ref 150–440)
RBC: 3.55 MIL/uL — AB (ref 3.80–5.20)
RDW: 14.7 % — ABNORMAL HIGH (ref 11.5–14.5)
WBC: 13.5 10*3/uL — AB (ref 3.6–11.0)

## 2016-01-26 LAB — FERRITIN: FERRITIN: 18 ng/mL (ref 11–307)

## 2016-01-26 LAB — LACTATE DEHYDROGENASE: LDH: 133 U/L (ref 98–192)

## 2016-02-02 ENCOUNTER — Inpatient Hospital Stay: Payer: Medicare Other

## 2016-02-02 ENCOUNTER — Inpatient Hospital Stay: Payer: Medicare Other | Attending: Internal Medicine | Admitting: Internal Medicine

## 2016-02-02 VITALS — BP 128/66 | HR 75 | Temp 96.7°F | Wt 205.0 lb

## 2016-02-02 DIAGNOSIS — D509 Iron deficiency anemia, unspecified: Secondary | ICD-10-CM | POA: Diagnosis not present

## 2016-02-02 DIAGNOSIS — R5383 Other fatigue: Secondary | ICD-10-CM | POA: Insufficient documentation

## 2016-02-02 DIAGNOSIS — E119 Type 2 diabetes mellitus without complications: Secondary | ICD-10-CM | POA: Diagnosis not present

## 2016-02-02 DIAGNOSIS — Z79899 Other long term (current) drug therapy: Secondary | ICD-10-CM | POA: Diagnosis not present

## 2016-02-02 DIAGNOSIS — Z85850 Personal history of malignant neoplasm of thyroid: Secondary | ICD-10-CM | POA: Diagnosis not present

## 2016-02-02 DIAGNOSIS — Z85038 Personal history of other malignant neoplasm of large intestine: Secondary | ICD-10-CM

## 2016-02-02 DIAGNOSIS — E039 Hypothyroidism, unspecified: Secondary | ICD-10-CM

## 2016-02-02 DIAGNOSIS — E875 Hyperkalemia: Secondary | ICD-10-CM

## 2016-02-02 DIAGNOSIS — F419 Anxiety disorder, unspecified: Secondary | ICD-10-CM | POA: Insufficient documentation

## 2016-02-02 DIAGNOSIS — Z7984 Long term (current) use of oral hypoglycemic drugs: Secondary | ICD-10-CM | POA: Insufficient documentation

## 2016-02-02 DIAGNOSIS — Z87891 Personal history of nicotine dependence: Secondary | ICD-10-CM | POA: Diagnosis not present

## 2016-02-02 DIAGNOSIS — I10 Essential (primary) hypertension: Secondary | ICD-10-CM | POA: Insufficient documentation

## 2016-02-02 DIAGNOSIS — K7460 Unspecified cirrhosis of liver: Secondary | ICD-10-CM | POA: Diagnosis not present

## 2016-02-02 DIAGNOSIS — Z803 Family history of malignant neoplasm of breast: Secondary | ICD-10-CM | POA: Diagnosis not present

## 2016-02-02 DIAGNOSIS — I129 Hypertensive chronic kidney disease with stage 1 through stage 4 chronic kidney disease, or unspecified chronic kidney disease: Secondary | ICD-10-CM

## 2016-02-02 DIAGNOSIS — D638 Anemia in other chronic diseases classified elsewhere: Secondary | ICD-10-CM

## 2016-02-02 DIAGNOSIS — Z85528 Personal history of other malignant neoplasm of kidney: Secondary | ICD-10-CM | POA: Insufficient documentation

## 2016-02-02 DIAGNOSIS — C641 Malignant neoplasm of right kidney, except renal pelvis: Secondary | ICD-10-CM

## 2016-02-02 DIAGNOSIS — N183 Chronic kidney disease, stage 3 (moderate): Secondary | ICD-10-CM | POA: Insufficient documentation

## 2016-02-02 DIAGNOSIS — K219 Gastro-esophageal reflux disease without esophagitis: Secondary | ICD-10-CM | POA: Diagnosis not present

## 2016-02-02 MED ORDER — SODIUM CHLORIDE 0.9 % IV SOLN
510.0000 mg | Freq: Once | INTRAVENOUS | Status: AC
  Administered 2016-02-02: 510 mg via INTRAVENOUS
  Filled 2016-02-02: qty 17

## 2016-02-02 MED ORDER — SODIUM CHLORIDE 0.9 % IV SOLN
Freq: Once | INTRAVENOUS | Status: AC
  Administered 2016-02-02: 11:00:00 via INTRAVENOUS
  Filled 2016-02-02: qty 1000

## 2016-02-02 NOTE — Assessment & Plan Note (Addendum)
#   Iron deficiency anemia- unclear etiology; Hb-9.3.; sat- 4%; recommend IV Ferrahem today.   # Hyperkalemia-K- 5.2/CKD- stage III- s/p nephrectomy. Discussed re: dietary changes.   # S/p thyroidectomy on Synthroid; recent TSH- 9.  monitored by PCP.   # RCC- clear-cell type; status post right nephrectomy; incidental focus of metastases in the right thyroid gland status post r thyroidectomy. Continue surveillance at this time.  # Colon ca- stage I- no evidence of recurrence.  # Cirrhosis PETscan- question Right hepatic lobe nodular lesion; MRI-NEG.  # IV Ferrahem today/ again lab-IV iron in 1 months; labs- 1 week prior/ IV iron 3 months/IV ferrahem.

## 2016-02-02 NOTE — Progress Notes (Signed)
Patient here today for follow up.  Patient has no new concerns today  

## 2016-02-02 NOTE — Progress Notes (Signed)
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 McQueen] Incidetal- 0.38mm focus of RCC in R thyroid lobe; Multifocal benign follicular adenomatous nodules   # Iron deficiency anemia requiring blood transfusion and intravenous iron therapy (May of 2017)  # Cirrhosis of liver [PET June 2017]- AUG 31st 2017- MRI liver-cirrhosis/no lesions.      Kidney cancer, primary, with metastasis from kidney to other site [facility])   08/26/2014 Initial Diagnosis    Kidney cancer, primary, with metastasis from kidney to other site [facility])       Cancer of right kidney ([place])   11/03/2015 Initial Diagnosis    Cancer of right kidney ([place])      INTERVAL HISTORY:    Rebecca Kirby 70 y.o.  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   Complains of mild-to-moderate fatigue; Worse in the last few weeks.. No new shortness of breath or chest pain. Her energy levels have improved post IV iron.. No blood in stools black stools. Recently she had her thyroid levels checked and Synthroid being adjusted by PCP.   REVIEW OF SYSTEMS:  A complete 10 point review of system is done which is negative except mentioned above/history of present illness.   PAST MEDICAL HISTORY :  Past Medical History:  Diagnosis Date  . Anemia   . Anxiety   . Colon cancer ([place])   . Diabetes mellitus without complication ([place])   . Family history of adverse reaction to anesthesia   . GERD (gastroesophageal reflux disease)   . Hypertension   . Hypothyroidism   . Kidney cancer, primary, with metastasis  from kidney to other site [facility]) 08/26/2014  . Thyroid cancer ([place])     PAST SURGICAL HISTORY :   Past Surgical History:  Procedure Laterality Date  . CHOLECYSTECTOMY    . COLON RESECTION    . COLONOSCOPY WITH PROPOFOL N/A 09/01/2015   Procedure: COLONOSCOPY WITH PROPOFOL;  Surgeon: Rebecca Silvas, MD;  Location: [facility] ENDOSCOPY;  Service: Endoscopy;  Laterality: N/A;  . ESOPHAGOGASTRODUODENOSCOPY (EGD) WITH PROPOFOL N/A 09/01/2015   Procedure: ESOPHAGOGASTRODUODENOSCOPY (EGD) WITH PROPOFOL;  Surgeon: Rebecca Silvas, MD;  Location: [facility] ENDOSCOPY;  Service: Endoscopy;  Laterality: N/A;  . EXCISION MASS NECK N/A 09/20/2015   Procedure: EXCISION MASS NECK;  Surgeon: Rebecca Gust, MD;  Location: [place];  Service: ENT;  Laterality: N/A;  . rt kidney removed Right   . THYROGLOSSAL DUCT CYST Right 09/20/2015   Procedure: excision sub mandibular gland right;  Surgeon: Rebecca Gust, MD;  Location: [place];  Service: ENT;  Laterality: Right;  . THYROID SURGERY Bilateral   . TOTAL KNEE ARTHROPLASTY Right   . TOTAL KNEE ARTHROPLASTY Left 08/09/2014   Procedure: TOTAL KNEE ARTHROPLASTY;  Surgeon: Rebecca Leep, MD;  Location: [facility] ORS;  Service: Orthopedics;  Laterality: Left;    FAMILY HISTORY :   Family History  Problem Relation Age of Onset  . Breast cancer Maternal Aunt   . Breast cancer Cousin     SOCIAL HISTORY:   Social History  Substance  Use Topics  . Smoking status: Former Smoker    Quit date: 07/29/1994  . Smokeless tobacco: Never Used  . Alcohol use No    ALLERGIES:  has No Known Allergies.  MEDICATIONS:  Current Outpatient Prescriptions  Medication Sig Dispense Refill  . amLODipine-olmesartan (AZOR) 5-20 MG per tablet Take 1 tablet by mouth daily.    . calcium acetate (PHOSLO) 667 MG capsule Take 667 mg by mouth 2 (two) times daily.     . Cholecalciferol (VITAMIN D-3) 1000 UNITS CAPS Take 1 capsule by mouth daily.    . citalopram (CELEXA) 40 MG  tablet Take 1 tablet by mouth  daily 90 tablet 3  . Fe Fum-FePoly-FA-Vit C-Vit B3 (FOLIVANE-F) 125-1 MG CAPS Take 1 capsule by mouth 2 (two) times daily.    Marland Kitchen glipiZIDE (GLUCOTROL) 10 MG tablet Take 10 mg by mouth 2 (two) times daily before a meal.    . HYDROcodone-acetaminophen (NORCO) 10-325 MG tablet Take 1 tablet by mouth every 6 (six) hours as needed for moderate pain.     Marland Kitchen levothyroxine (SYNTHROID, LEVOTHROID) 150 MCG tablet Take 150 mcg by mouth daily before breakfast.    . metFORMIN (GLUCOPHAGE) 1000 MG tablet Take 1,000 mg by mouth 2 (two) times daily with a meal.    . nebivolol (BYSTOLIC) 5 MG tablet Take 5 mg by mouth daily.    . pantoprazole (PROTONIX) 40 MG tablet Take 40 mg by mouth daily.    . pioglitazone (ACTOS) 30 MG tablet Take 30 mg by mouth daily.    . sitaGLIPtin (JANUVIA) 100 MG tablet Take 100 mg by mouth daily.    . vitamin B-12 (CYANOCOBALAMIN) 1000 MCG tablet Take 1,000 mcg by mouth daily. Reported on 07/07/2015     No current facility-administered medications for this visit.     PHYSICAL EXAMINATION: ECOG PERFORMANCE STATUS: 0 - Asymptomatic  BP 128/66 (BP Location: Left Arm, Patient Position: Sitting)   Pulse 75   Temp (!) 96.7 F (35.9 C) (Tympanic)   Wt 205 lb (93 kg)   BMI 33.09 kg/m   Filed Weights   02/02/16 1018  Weight: 205 lb (93 kg)    GENERAL: Well-nourished well-developed; Alert, no distress and comfortable.  With her daughter Rebecca Kirby.  EYES: no pallor or icterus OROPHARYNX: no thrush or ulceration; good dentition  NECK: supple, obvious neck mass noted in midline [lipoma] LYMPH:  no palpable lymphadenopathy in the cervical, axillary or inguinal regions LUNGS: clear to auscultation and  No wheeze or crackles HEART/CVS: regular rate & rhythm and no murmurs; No lower extremity edema ABDOMEN:abdomen soft, non-tender and normal bowel sounds Musculoskeletal:no cyanosis of digits and no clubbing  PSYCH: alert & oriented x 3 with fluent  speech NEURO: no focal motor/sensory deficits SKIN:  no rashes or significant lesions  LABORATORY DATA:  I have reviewed the data as listed    Component Value Date/Time   NA 136 01/26/2016 0942   NA 138 03/05/2014 0935   K 5.2 (H) 01/26/2016 0942   K 4.6 03/05/2014 0935   CL 101 01/26/2016 0942   CL 103 03/05/2014 0935   CO2 25 01/26/2016 0942   CO2 25 03/05/2014 0935   GLUCOSE 262 (H) 01/26/2016 0942   GLUCOSE 200 (H) 03/05/2014 0935   BUN 18 01/26/2016 0942   BUN 20 (H) 03/05/2014 0935   CREATININE 1.23 (H) 01/26/2016 0942   CREATININE 1.46 (H) 03/05/2014 0935   CALCIUM 8.7 (L) 01/26/2016 0942   CALCIUM 9.5 03/05/2014 0935  PROT 7.1 01/26/2016 0942   PROT 7.3 02/02/2014 1454   ALBUMIN 3.7 01/26/2016 0942   ALBUMIN 3.1 (L) 02/10/2014 0410   AST 22 01/26/2016 0942   AST 35 02/02/2014 1454   ALT 13 (L) 01/26/2016 0942   ALT 27 02/02/2014 1454   ALKPHOS 72 01/26/2016 0942   ALKPHOS 72 02/02/2014 1454   BILITOT 0.7 01/26/2016 0942   BILITOT 0.2 02/02/2014 1454   GFRNONAA 43 (L) 01/26/2016 0942   GFRNONAA 38 (L) 03/05/2014 0935   GFRNONAA 50 (L) 10/02/2013 1027   GFRAA 50 (L) 01/26/2016 0942   GFRAA 46 (L) 03/05/2014 0935   GFRAA 58 (L) 10/02/2013 1027    No results found for: SPEP, UPEP  Lab Results  Component Value Date   WBC 13.5 (H) 01/26/2016   NEUTROABS 11.8 (H) 01/26/2016   HGB 9.7 (L) 01/26/2016   HCT 29.9 (L) 01/26/2016   MCV 84.2 01/26/2016   PLT 211 01/26/2016      Chemistry      Component Value Date/Time   NA 136 01/26/2016 0942   NA 138 03/05/2014 0935   K 5.2 (H) 01/26/2016 0942   K 4.6 03/05/2014 0935   CL 101 01/26/2016 0942   CL 103 03/05/2014 0935   CO2 25 01/26/2016 0942   CO2 25 03/05/2014 0935   BUN 18 01/26/2016 0942   BUN 20 (H) 03/05/2014 0935   CREATININE 1.23 (H) 01/26/2016 0942   CREATININE 1.46 (H) 03/05/2014 0935      Component Value Date/Time   CALCIUM 8.7 (L) 01/26/2016 0942   CALCIUM 9.5 03/05/2014 0935   ALKPHOS  72 01/26/2016 0942   ALKPHOS 72 02/02/2014 1454   AST 22 01/26/2016 0942   AST 35 02/02/2014 1454   ALT 13 (L) 01/26/2016 0942   ALT 27 02/02/2014 1454   BILITOT 0.7 01/26/2016 0942   BILITOT 0.2 02/02/2014 1454       RADIOGRAPHIC STUDIES: I have personally reviewed the radiological images as listed and agreed with the findings in the report. No results found.   ASSESSMENT & PLAN:  Cancer of right kidney ([place]) # Iron deficiency anemia- unclear etiology; Hb-9.3.; sat- 4%; recommend IV Ferrahem today.   # Hyperkalemia-K- 5.2/CKD- stage III- s/p nephrectomy. Discussed re: dietary changes.   # S/p thyroidectomy on Synthroid; recent TSH- 9.  monitored by PCP.   # RCC- clear-cell type; status post right nephrectomy; incidental focus of metastases in the right thyroid gland status post r thyroidectomy. Continue surveillance at this time.  # Colon ca- stage I- no evidence of recurrence.  # Cirrhosis PETscan- question Right hepatic lobe nodular lesion; MRI-NEG.  # IV Ferrahem today/ again lab-IV iron in 1 months; labs- 1 week prior/ IV iron 3 months/IV ferrahem.   Orders Placed This Encounter  Procedures  . CBC with Differential    Standing Status:   Future    Standing Expiration Date:   02/01/2017  . CBC with Differential    Standing Status:   Future    Standing Expiration Date:   02/01/2017  . Basic metabolic panel    Standing Status:   Future    Standing Expiration Date:   02/01/2017  . Ferritin    Standing Status:   Future    Standing Expiration Date:   02/01/2017  . Iron and TIBC    Standing Status:   Future    Standing Expiration Date:   02/01/2017   All questions were answered. The patient knows to call  the clinic with any problems, questions or concerns.      Cammie Sickle, MD 02/02/2016 1:29 PM

## 2016-02-27 DIAGNOSIS — C259 Malignant neoplasm of pancreas, unspecified: Secondary | ICD-10-CM

## 2016-02-27 HISTORY — DX: Malignant neoplasm of pancreas, unspecified: C25.9

## 2016-03-05 ENCOUNTER — Inpatient Hospital Stay: Payer: Medicare Other

## 2016-03-05 ENCOUNTER — Inpatient Hospital Stay: Payer: Medicare Other | Attending: Internal Medicine

## 2016-03-05 VITALS — BP 151/74 | HR 72 | Temp 97.0°F | Resp 18

## 2016-03-05 DIAGNOSIS — D509 Iron deficiency anemia, unspecified: Secondary | ICD-10-CM

## 2016-03-05 DIAGNOSIS — D638 Anemia in other chronic diseases classified elsewhere: Secondary | ICD-10-CM

## 2016-03-05 DIAGNOSIS — Z79899 Other long term (current) drug therapy: Secondary | ICD-10-CM | POA: Diagnosis not present

## 2016-03-05 LAB — CBC WITH DIFFERENTIAL/PLATELET
BASOS ABS: 0.1 10*3/uL (ref 0–0.1)
Basophils Relative: 1 %
EOS PCT: 1 %
Eosinophils Absolute: 0.1 10*3/uL (ref 0–0.7)
HEMATOCRIT: 31 % — AB (ref 35.0–47.0)
Hemoglobin: 10.1 g/dL — ABNORMAL LOW (ref 12.0–16.0)
LYMPHS ABS: 1.2 10*3/uL (ref 1.0–3.6)
LYMPHS PCT: 11 %
MCH: 27.5 pg (ref 26.0–34.0)
MCHC: 32.7 g/dL (ref 32.0–36.0)
MCV: 84 fL (ref 80.0–100.0)
MONO ABS: 0.5 10*3/uL (ref 0.2–0.9)
MONOS PCT: 5 %
NEUTROS ABS: 8.4 10*3/uL — AB (ref 1.4–6.5)
Neutrophils Relative %: 82 %
PLATELETS: 242 10*3/uL (ref 150–440)
RBC: 3.68 MIL/uL — ABNORMAL LOW (ref 3.80–5.20)
RDW: 17.5 % — AB (ref 11.5–14.5)
WBC: 10.3 10*3/uL (ref 3.6–11.0)

## 2016-03-05 MED ORDER — SODIUM CHLORIDE 0.9 % IV SOLN
Freq: Once | INTRAVENOUS | Status: AC
  Administered 2016-03-05: 14:00:00 via INTRAVENOUS
  Filled 2016-03-05: qty 1000

## 2016-03-05 MED ORDER — SODIUM CHLORIDE 0.9 % IV SOLN
510.0000 mg | Freq: Once | INTRAVENOUS | Status: AC
  Administered 2016-03-05: 510 mg via INTRAVENOUS
  Filled 2016-03-05: qty 17

## 2016-04-04 ENCOUNTER — Other Ambulatory Visit: Payer: Self-pay | Admitting: Internal Medicine

## 2016-04-04 DIAGNOSIS — C641 Malignant neoplasm of right kidney, except renal pelvis: Secondary | ICD-10-CM

## 2016-04-04 DIAGNOSIS — R921 Mammographic calcification found on diagnostic imaging of breast: Secondary | ICD-10-CM

## 2016-04-11 ENCOUNTER — Ambulatory Visit
Admission: RE | Admit: 2016-04-11 | Discharge: 2016-04-11 | Disposition: A | Discharge status: Home / Self Care | Payer: Medicare Other | Source: Ambulatory Visit | Attending: Internal Medicine | Admitting: Internal Medicine

## 2016-04-11 DIAGNOSIS — C641 Malignant neoplasm of right kidney, except renal pelvis: Secondary | ICD-10-CM

## 2016-04-11 DIAGNOSIS — N281 Cyst of kidney, acquired: Secondary | ICD-10-CM | POA: Diagnosis not present

## 2016-04-11 DIAGNOSIS — Z905 Acquired absence of kidney: Secondary | ICD-10-CM | POA: Insufficient documentation

## 2016-04-30 ENCOUNTER — Other Ambulatory Visit: Payer: Self-pay | Admitting: Internal Medicine

## 2016-04-30 ENCOUNTER — Inpatient Hospital Stay: Payer: Medicare Other

## 2016-04-30 DIAGNOSIS — Z85038 Personal history of other malignant neoplasm of large intestine: Secondary | ICD-10-CM | POA: Diagnosis not present

## 2016-04-30 DIAGNOSIS — K219 Gastro-esophageal reflux disease without esophagitis: Secondary | ICD-10-CM | POA: Insufficient documentation

## 2016-04-30 DIAGNOSIS — I10 Essential (primary) hypertension: Secondary | ICD-10-CM | POA: Insufficient documentation

## 2016-04-30 DIAGNOSIS — K7460 Unspecified cirrhosis of liver: Secondary | ICD-10-CM | POA: Insufficient documentation

## 2016-04-30 DIAGNOSIS — E890 Postprocedural hypothyroidism: Secondary | ICD-10-CM | POA: Insufficient documentation

## 2016-04-30 DIAGNOSIS — D500 Iron deficiency anemia secondary to blood loss (chronic): Secondary | ICD-10-CM | POA: Insufficient documentation

## 2016-04-30 DIAGNOSIS — Z905 Acquired absence of kidney: Secondary | ICD-10-CM | POA: Insufficient documentation

## 2016-04-30 DIAGNOSIS — D509 Iron deficiency anemia, unspecified: Secondary | ICD-10-CM

## 2016-04-30 DIAGNOSIS — Z85528 Personal history of other malignant neoplasm of kidney: Secondary | ICD-10-CM | POA: Insufficient documentation

## 2016-04-30 DIAGNOSIS — E119 Type 2 diabetes mellitus without complications: Secondary | ICD-10-CM | POA: Diagnosis not present

## 2016-04-30 DIAGNOSIS — Z803 Family history of malignant neoplasm of breast: Secondary | ICD-10-CM | POA: Insufficient documentation

## 2016-04-30 DIAGNOSIS — M7989 Other specified soft tissue disorders: Secondary | ICD-10-CM | POA: Diagnosis not present

## 2016-04-30 DIAGNOSIS — Z7984 Long term (current) use of oral hypoglycemic drugs: Secondary | ICD-10-CM | POA: Insufficient documentation

## 2016-04-30 DIAGNOSIS — F419 Anxiety disorder, unspecified: Secondary | ICD-10-CM | POA: Insufficient documentation

## 2016-04-30 DIAGNOSIS — Z79899 Other long term (current) drug therapy: Secondary | ICD-10-CM | POA: Diagnosis not present

## 2016-04-30 DIAGNOSIS — R5383 Other fatigue: Secondary | ICD-10-CM | POA: Insufficient documentation

## 2016-04-30 DIAGNOSIS — Z87891 Personal history of nicotine dependence: Secondary | ICD-10-CM | POA: Diagnosis not present

## 2016-04-30 LAB — CBC WITH DIFFERENTIAL/PLATELET
BASOS ABS: 0.1 10*3/uL (ref 0–0.1)
Basophils Relative: 1 %
EOS ABS: 0.1 10*3/uL (ref 0–0.7)
Eosinophils Relative: 1 %
HCT: 28.2 % — ABNORMAL LOW (ref 35.0–47.0)
HEMOGLOBIN: 9.4 g/dL — AB (ref 12.0–16.0)
LYMPHS ABS: 1.3 10*3/uL (ref 1.0–3.6)
Lymphocytes Relative: 14 %
MCH: 27.8 pg (ref 26.0–34.0)
MCHC: 33.4 g/dL (ref 32.0–36.0)
MCV: 83.4 fL (ref 80.0–100.0)
Monocytes Absolute: 0.5 10*3/uL (ref 0.2–0.9)
Monocytes Relative: 5 %
NEUTROS PCT: 79 %
Neutro Abs: 7.2 10*3/uL — ABNORMAL HIGH (ref 1.4–6.5)
PLATELETS: 234 10*3/uL (ref 150–440)
RBC: 3.38 MIL/uL — AB (ref 3.80–5.20)
RDW: 16.3 % — ABNORMAL HIGH (ref 11.5–14.5)
WBC: 9.2 10*3/uL (ref 3.6–11.0)

## 2016-04-30 LAB — BASIC METABOLIC PANEL
ANION GAP: 6 (ref 5–15)
BUN: 15 mg/dL (ref 6–20)
CHLORIDE: 103 mmol/L (ref 101–111)
CO2: 26 mmol/L (ref 22–32)
Calcium: 8.7 mg/dL — ABNORMAL LOW (ref 8.9–10.3)
Creatinine, Ser: 1.03 mg/dL — ABNORMAL HIGH (ref 0.44–1.00)
GFR calc Af Amer: >60 mL/min (ref >60)
GFR, EST NON AFRICAN AMERICAN: 53 mL/min — AB (ref >60)
Glucose, Bld: 201 mg/dL — ABNORMAL HIGH (ref 65–99)
POTASSIUM: 4.9 mmol/L (ref 3.5–5.1)
SODIUM: 135 mmol/L (ref 135–145)

## 2016-04-30 LAB — IRON AND TIBC
Iron: 31 ug/dL (ref 28–170)
SATURATION RATIOS: 7 % — AB (ref 10.4–31.8)
TIBC: 424 ug/dL (ref 250–450)
UIBC: 393 ug/dL

## 2016-04-30 LAB — FERRITIN: Ferritin: 12 ng/mL (ref 11–307)

## 2016-05-02 ENCOUNTER — Inpatient Hospital Stay: Payer: Medicare Other

## 2016-05-02 ENCOUNTER — Inpatient Hospital Stay: Payer: Medicare Other | Attending: Internal Medicine | Admitting: Internal Medicine

## 2016-05-02 VITALS — BP 134/67 | HR 73 | Temp 96.6°F | Resp 18 | Ht 66.0 in | Wt 206.2 lb

## 2016-05-02 DIAGNOSIS — Z7984 Long term (current) use of oral hypoglycemic drugs: Secondary | ICD-10-CM

## 2016-05-02 DIAGNOSIS — F419 Anxiety disorder, unspecified: Secondary | ICD-10-CM

## 2016-05-02 DIAGNOSIS — K219 Gastro-esophageal reflux disease without esophagitis: Secondary | ICD-10-CM | POA: Diagnosis not present

## 2016-05-02 DIAGNOSIS — Z87891 Personal history of nicotine dependence: Secondary | ICD-10-CM | POA: Diagnosis not present

## 2016-05-02 DIAGNOSIS — D500 Iron deficiency anemia secondary to blood loss (chronic): Secondary | ICD-10-CM | POA: Diagnosis not present

## 2016-05-02 DIAGNOSIS — D638 Anemia in other chronic diseases classified elsewhere: Secondary | ICD-10-CM

## 2016-05-02 DIAGNOSIS — Z803 Family history of malignant neoplasm of breast: Secondary | ICD-10-CM

## 2016-05-02 DIAGNOSIS — E119 Type 2 diabetes mellitus without complications: Secondary | ICD-10-CM | POA: Diagnosis not present

## 2016-05-02 DIAGNOSIS — Z85038 Personal history of other malignant neoplasm of large intestine: Secondary | ICD-10-CM | POA: Diagnosis not present

## 2016-05-02 DIAGNOSIS — E890 Postprocedural hypothyroidism: Secondary | ICD-10-CM

## 2016-05-02 DIAGNOSIS — K7460 Unspecified cirrhosis of liver: Secondary | ICD-10-CM

## 2016-05-02 DIAGNOSIS — M7989 Other specified soft tissue disorders: Secondary | ICD-10-CM

## 2016-05-02 DIAGNOSIS — Z85528 Personal history of other malignant neoplasm of kidney: Secondary | ICD-10-CM | POA: Diagnosis not present

## 2016-05-02 DIAGNOSIS — I10 Essential (primary) hypertension: Secondary | ICD-10-CM | POA: Diagnosis not present

## 2016-05-02 DIAGNOSIS — Z79899 Other long term (current) drug therapy: Secondary | ICD-10-CM

## 2016-05-02 DIAGNOSIS — Z905 Acquired absence of kidney: Secondary | ICD-10-CM

## 2016-05-02 DIAGNOSIS — R5383 Other fatigue: Secondary | ICD-10-CM | POA: Diagnosis not present

## 2016-05-02 MED ORDER — FERUMOXYTOL INJECTION 510 MG/17 ML
510.0000 mg | Freq: Once | INTRAVENOUS | Status: AC
  Administered 2016-05-02: 510 mg via INTRAVENOUS
  Filled 2016-05-02: qty 17

## 2016-05-02 MED ORDER — SODIUM CHLORIDE 0.9 % IV SOLN
Freq: Once | INTRAVENOUS | Status: AC
  Administered 2016-05-02: 15:00:00 via INTRAVENOUS
  Filled 2016-05-02: qty 1000

## 2016-05-02 NOTE — Progress Notes (Signed)
Patient here for follow-up for IDA, renal cancer. H/o colon cancer.

## 2016-05-02 NOTE — Assessment & Plan Note (Addendum)
#   Iron deficiency anemia- unclear etiology; Hb-9.3.; sat- 4%; recommend IV Ferrahem today ; and again next week.   # S/p thyroidectomy on Synthroid; recent TSH- 9.  monitored by PCP.   # Bil LE swelling- ? Cirrhosis. Recommend leg elevation; stockings; see below.   # RCC- clear-cell type; status post right nephrectomy; incidental focus of metastases in the right thyroid gland status post r thyroidectomy. Continue surveillance at this time.  # Colon ca- stage I- no evidence of recurrence.  # Cirrhosis PETscan- question Right hepatic lobe nodular lesion; MRI-NEG. Recommend follow with Dr.Elliot esp with bil LE swelling.   # IV Ferrahem today/ again next week; lab/6 weeks; and 1 week prior/ IV iron 3 months/IV ferrahem.

## 2016-05-02 NOTE — Progress Notes (Signed)
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 McQueen] Incidetal- 0.51mm focus of RCC in R thyroid lobe; Multifocal benign follicular adenomatous nodules   # Iron deficiency anemia requiring blood transfusion and intravenous iron therapy (May of 2017)  # Cirrhosis of liver [PET June 2017]- AUG 31st 2017- MRI liver-cirrhosis/no lesions.      Kidney cancer, primary, with metastasis from kidney to other site [facility])   08/26/2014 Initial Diagnosis    Kidney cancer, primary, with metastasis from kidney to other site [facility])       Cancer of right kidney ([place])   11/03/2015 Initial Diagnosis    Cancer of right kidney ([place])      INTERVAL HISTORY:    Rebecca Kirby 71 y.o.  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  Patient complains of worsening swelling in the bilateral lower extremity is. Left more than right. She has had Dopplers of bilateral lower extremities to her PCP recently negative for any DVT.  She complains of fatigue. No blood in stools or black stools. No new shortness of breath or chest pain. Her energy levels have improved post IV iron.   REVIEW OF SYSTEMS:  A complete 10 point review of system is done which is negative except mentioned above/history of present illness.   PAST MEDICAL HISTORY :  Past Medical History:  Diagnosis Date  . Anemia   . Anxiety   . Colon cancer ([place])   . Diabetes mellitus without complication ([place])   . Family history of adverse reaction to anesthesia   . GERD (gastroesophageal reflux disease)   . Hypertension   .  Hypothyroidism   . Kidney cancer, primary, with metastasis from kidney to other site [facility]) 08/26/2014  . Thyroid cancer ([place])     PAST SURGICAL HISTORY :   Past Surgical History:  Procedure Laterality Date  . CHOLECYSTECTOMY    . COLON RESECTION    . COLONOSCOPY WITH PROPOFOL N/A 09/01/2015   Procedure: COLONOSCOPY WITH PROPOFOL;  Surgeon: Manya Silvas, MD;  [facility] ENDOSCOPY;  Service: Endoscopy;  Laterality: N/A;  . ESOPHAGOGASTRODUODENOSCOPY (EGD) WITH PROPOFOL N/A 09/01/2015   Procedure: ESOPHAGOGASTRODUODENOSCOPY (EGD) WITH PROPOFOL;  Surgeon: Manya Silvas, MD;  Location: [facility] ENDOSCOPY;  Service: Endoscopy;  Laterality: N/A;  . EXCISION MASS NECK N/A 09/20/2015   Procedure: EXCISION MASS NECK;  Surgeon: Beverly Gust, MD;  Location: [place];  Service: ENT;  Laterality: N/A;  . rt kidney removed Right   . THYROGLOSSAL DUCT CYST Right 09/20/2015   Procedure: excision sub mandibular gland right;  Surgeon: Beverly Gust, MD;  Location: [place];  Service: ENT;  Laterality: Right;  . THYROID SURGERY Bilateral   . TOTAL KNEE ARTHROPLASTY Right   . TOTAL KNEE ARTHROPLASTY Left 08/09/2014   Procedure: TOTAL KNEE ARTHROPLASTY;  Surgeon: Dereck Leep, MD;  Location: [facility] ORS;  Service: Orthopedics;  Laterality: Left;    FAMILY HISTORY :   Family History  Problem Relation Age of Onset  . Breast cancer Maternal Aunt   . Breast cancer Cousin  SOCIAL HISTORY:   Social History  Substance Use Topics  . Smoking status: Former Smoker    Quit date: 07/29/1994  . Smokeless tobacco: Never Used  . Alcohol use No    ALLERGIES:  has No Known Allergies.  MEDICATIONS:  Current Outpatient Prescriptions  Medication Sig Dispense Refill  . amLODipine-olmesartan (AZOR) 5-20 MG per tablet Take 1 tablet by mouth daily.    . calcium acetate (PHOSLO) 667 MG capsule Take 667 mg by mouth 2 (two) times daily.     . Cholecalciferol (VITAMIN D-3) 1000 UNITS CAPS  Take 1 capsule by mouth daily.    . citalopram (CELEXA) 40 MG tablet Take 1 tablet by mouth  daily 90 tablet 3  . Fe Fum-FePoly-FA-Vit C-Vit B3 (FOLIVANE-F) 125-1 MG CAPS Take 1 capsule by mouth 2 (two) times daily.    Marland Kitchen glipiZIDE (GLUCOTROL) 10 MG tablet Take 10 mg by mouth 2 (two) times daily before a meal.    . HYDROcodone-acetaminophen (NORCO) 10-325 MG tablet Take 1 tablet by mouth every 6 (six) hours as needed for moderate pain.     Marland Kitchen levothyroxine (SYNTHROID, LEVOTHROID) 175 MCG tablet Take 1 tablet by mouth daily.    . metFORMIN (GLUCOPHAGE) 1000 MG tablet Take 1,000 mg by mouth 2 (two) times daily with a meal.    . nebivolol (BYSTOLIC) 5 MG tablet Take 5 mg by mouth daily.    . pantoprazole (PROTONIX) 40 MG tablet Take 40 mg by mouth daily.    . pioglitazone (ACTOS) 30 MG tablet Take 30 mg by mouth daily.    . sitaGLIPtin (JANUVIA) 100 MG tablet Take 100 mg by mouth daily.    . vitamin B-12 (CYANOCOBALAMIN) 1000 MCG tablet Take 1,000 mcg by mouth daily. Reported on 07/07/2015     No current facility-administered medications for this visit.     PHYSICAL EXAMINATION: ECOG PERFORMANCE STATUS: 0 - Asymptomatic  BP 134/67 (BP Location: Left Arm, Patient Position: Sitting)   Pulse 73   Temp (!) 96.6 F (35.9 C) (Tympanic)   Resp 18   Ht 5\' 6"  (1.676 m)   Wt 206 lb 3.2 oz (93.5 kg)   BMI 33.28 kg/m   Filed Weights   05/02/16 1335  Weight: 206 lb 3.2 oz (93.5 kg)    GENERAL: Well-nourished well-developed; Alert, no distress and comfortable.  Alone.  EYES: no pallor or icterus OROPHARYNX: no thrush or ulceration; good dentition  NECK: supple, obvious neck mass noted in midline [lipoma] LYMPH:  no palpable lymphadenopathy in the cervical, axillary or inguinal regions LUNGS: clear to auscultation and  No wheeze or crackles HEART/CVS: regular rate & rhythm and no murmurs; Bil +2 lower extremity edema ABDOMEN:abdomen soft, non-tender and normal bowel sounds Musculoskeletal:no  cyanosis of digits and no clubbing  PSYCH: alert & oriented x 3 with fluent speech NEURO: no focal motor/sensory deficits SKIN:  no rashes or significant lesions  LABORATORY DATA:  I have reviewed the data as listed    Component Value Date/Time   NA 135 04/30/2016 1308   NA 138 03/05/2014 0935   K 4.9 04/30/2016 1308   K 4.6 03/05/2014 0935   CL 103 04/30/2016 1308   CL 103 03/05/2014 0935   CO2 26 04/30/2016 1308   CO2 25 03/05/2014 0935   GLUCOSE 201 (H) 04/30/2016 1308   GLUCOSE 200 (H) 03/05/2014 0935   BUN 15 04/30/2016 1308   BUN 20 (H) 03/05/2014 0935   CREATININE 1.03 (H) 04/30/2016 1308   CREATININE  1.46 (H) 03/05/2014 0935   CALCIUM 8.7 (L) 04/30/2016 1308   CALCIUM 9.5 03/05/2014 0935   PROT 7.1 01/26/2016 0942   PROT 7.3 02/02/2014 1454   ALBUMIN 3.7 01/26/2016 0942   ALBUMIN 3.1 (L) 02/10/2014 0410   AST 22 01/26/2016 0942   AST 35 02/02/2014 1454   ALT 13 (L) 01/26/2016 0942   ALT 27 02/02/2014 1454   ALKPHOS 72 01/26/2016 0942   ALKPHOS 72 02/02/2014 1454   BILITOT 0.7 01/26/2016 0942   BILITOT 0.2 02/02/2014 1454   GFRNONAA 53 (L) 04/30/2016 1308   GFRNONAA 38 (L) 03/05/2014 0935   GFRNONAA 50 (L) 10/02/2013 1027   GFRAA >60 04/30/2016 1308   GFRAA 46 (L) 03/05/2014 0935   GFRAA 58 (L) 10/02/2013 1027    No results found for: SPEP, UPEP  Lab Results  Component Value Date   WBC 9.2 04/30/2016   NEUTROABS 7.2 (H) 04/30/2016   HGB 9.4 (L) 04/30/2016   HCT 28.2 (L) 04/30/2016   MCV 83.4 04/30/2016   PLT 234 04/30/2016      Chemistry      Component Value Date/Time   NA 135 04/30/2016 1308   NA 138 03/05/2014 0935   K 4.9 04/30/2016 1308   K 4.6 03/05/2014 0935   CL 103 04/30/2016 1308   CL 103 03/05/2014 0935   CO2 26 04/30/2016 1308   CO2 25 03/05/2014 0935   BUN 15 04/30/2016 1308   BUN 20 (H) 03/05/2014 0935   CREATININE 1.03 (H) 04/30/2016 1308   CREATININE 1.46 (H) 03/05/2014 0935      Component Value Date/Time   CALCIUM 8.7  (L) 04/30/2016 1308   CALCIUM 9.5 03/05/2014 0935   ALKPHOS 72 01/26/2016 0942   ALKPHOS 72 02/02/2014 1454   AST 22 01/26/2016 0942   AST 35 02/02/2014 1454   ALT 13 (L) 01/26/2016 0942   ALT 27 02/02/2014 1454   BILITOT 0.7 01/26/2016 0942   BILITOT 0.2 02/02/2014 1454       RADIOGRAPHIC STUDIES: I have personally reviewed the radiological images as listed and agreed with the findings in the report. No results found.   ASSESSMENT & PLAN:  Iron deficiency anemia due to chronic blood loss # Iron deficiency anemia- unclear etiology; Hb-9.3.; sat- 4%; recommend IV Ferrahem today ; and again next week.   # S/p thyroidectomy on Synthroid; recent TSH- 9.  monitored by PCP.   # Bil LE swelling- ? Cirrhosis. Recommend leg elevation; stockings; see below.   # RCC- clear-cell type; status post right nephrectomy; incidental focus of metastases in the right thyroid gland status post r thyroidectomy. Continue surveillance at this time.  # Colon ca- stage I- no evidence of recurrence.  # Cirrhosis PETscan- question Right hepatic lobe nodular lesion; MRI-NEG. Recommend follow with Dr.Elliot esp with bil LE swelling.   # IV Ferrahem today/ again next week; lab/6 weeks; and 1 week prior/ IV iron 3 months/IV ferrahem.   Orders Placed This Encounter  Procedures  . CBC with Differential    Standing Status:   Future    Standing Expiration Date:   05/02/2017  . CBC with Differential    Standing Status:   Future    Standing Expiration Date:   05/02/2017  . Comprehensive metabolic panel    Standing Status:   Future    Standing Expiration Date:   05/02/2017  . Ferritin    Standing Status:   Future    Standing Expiration Date:  05/02/2017  . Iron and TIBC    Standing Status:   Future    Standing Expiration Date:   05/02/2017   All questions were answered. The patient knows to call the clinic with any problems, questions or concerns.      Cammie Sickle, MD 05/02/2016 2:23 PM

## 2016-05-09 ENCOUNTER — Inpatient Hospital Stay: Payer: Medicare Other

## 2016-05-09 VITALS — BP 114/67 | HR 72 | Temp 97.0°F | Resp 20

## 2016-05-09 DIAGNOSIS — D500 Iron deficiency anemia secondary to blood loss (chronic): Secondary | ICD-10-CM

## 2016-05-09 DIAGNOSIS — D638 Anemia in other chronic diseases classified elsewhere: Secondary | ICD-10-CM

## 2016-05-09 MED ORDER — SODIUM CHLORIDE 0.9 % IV SOLN
510.0000 mg | Freq: Once | INTRAVENOUS | Status: AC
  Administered 2016-05-09: 510 mg via INTRAVENOUS
  Filled 2016-05-09: qty 17

## 2016-05-09 MED ORDER — SODIUM CHLORIDE 0.9 % IV SOLN
Freq: Once | INTRAVENOUS | Status: AC
  Administered 2016-05-09: 14:00:00 via INTRAVENOUS
  Filled 2016-05-09: qty 1000

## 2016-05-10 ENCOUNTER — Other Ambulatory Visit: Payer: Medicare Other

## 2016-05-10 ENCOUNTER — Ambulatory Visit: Payer: Medicare Other

## 2016-05-29 ENCOUNTER — Ambulatory Visit
Admission: RE | Admit: 2016-05-29 | Discharge: 2016-05-29 | Disposition: A | Discharge status: Home / Self Care | Payer: Medicare Other | Source: Ambulatory Visit | Attending: Internal Medicine | Admitting: Internal Medicine

## 2016-05-29 DIAGNOSIS — R921 Mammographic calcification found on diagnostic imaging of breast: Secondary | ICD-10-CM | POA: Diagnosis present

## 2016-05-31 ENCOUNTER — Encounter: Payer: Self-pay | Admitting: Internal Medicine

## 2016-05-31 ENCOUNTER — Other Ambulatory Visit: Payer: Self-pay | Admitting: *Deleted

## 2016-05-31 DIAGNOSIS — R600 Localized edema: Secondary | ICD-10-CM

## 2016-05-31 DIAGNOSIS — K7460 Unspecified cirrhosis of liver: Secondary | ICD-10-CM

## 2016-06-13 ENCOUNTER — Inpatient Hospital Stay: Payer: Medicare Other

## 2016-06-13 ENCOUNTER — Inpatient Hospital Stay: Payer: Medicare Other | Attending: Internal Medicine

## 2016-06-13 VITALS — BP 116/79 | HR 70 | Resp 20

## 2016-06-13 DIAGNOSIS — Z79899 Other long term (current) drug therapy: Secondary | ICD-10-CM | POA: Insufficient documentation

## 2016-06-13 DIAGNOSIS — D638 Anemia in other chronic diseases classified elsewhere: Secondary | ICD-10-CM

## 2016-06-13 DIAGNOSIS — D500 Iron deficiency anemia secondary to blood loss (chronic): Secondary | ICD-10-CM | POA: Insufficient documentation

## 2016-06-13 LAB — CBC WITH DIFFERENTIAL/PLATELET
BASOS ABS: 0.1 10*3/uL (ref 0–0.1)
BASOS PCT: 1 %
EOS PCT: 1 %
Eosinophils Absolute: 0.2 10*3/uL (ref 0–0.7)
HCT: 32.3 % — ABNORMAL LOW (ref 35.0–47.0)
Hemoglobin: 10.8 g/dL — ABNORMAL LOW (ref 12.0–16.0)
Lymphocytes Relative: 13 %
Lymphs Abs: 1.4 10*3/uL (ref 1.0–3.6)
MCH: 28.3 pg (ref 26.0–34.0)
MCHC: 33.4 g/dL (ref 32.0–36.0)
MCV: 84.8 fL (ref 80.0–100.0)
MONO ABS: 0.5 10*3/uL (ref 0.2–0.9)
Monocytes Relative: 5 %
NEUTROS PCT: 80 %
Neutro Abs: 9 10*3/uL — ABNORMAL HIGH (ref 1.4–6.5)
PLATELETS: 214 10*3/uL (ref 150–440)
RBC: 3.81 MIL/uL (ref 3.80–5.20)
RDW: 17.8 % — AB (ref 11.5–14.5)
WBC: 11.2 10*3/uL — ABNORMAL HIGH (ref 3.6–11.0)

## 2016-06-13 MED ORDER — SODIUM CHLORIDE 0.9 % IV SOLN
Freq: Once | INTRAVENOUS | Status: AC
  Administered 2016-06-13: 14:00:00 via INTRAVENOUS
  Filled 2016-06-13: qty 1000

## 2016-06-13 MED ORDER — SODIUM CHLORIDE 0.9 % IV SOLN
510.0000 mg | Freq: Once | INTRAVENOUS | Status: AC
  Administered 2016-06-13: 510 mg via INTRAVENOUS
  Filled 2016-06-13: qty 17

## 2016-06-15 ENCOUNTER — Other Ambulatory Visit: Payer: Self-pay | Admitting: Internal Medicine

## 2016-06-15 DIAGNOSIS — K7460 Unspecified cirrhosis of liver: Secondary | ICD-10-CM

## 2016-06-20 ENCOUNTER — Other Ambulatory Visit: Payer: Self-pay | Admitting: Internal Medicine

## 2016-06-20 ENCOUNTER — Ambulatory Visit
Admission: RE | Admit: 2016-06-20 | Discharge: 2016-06-20 | Disposition: A | Discharge status: Home / Self Care | Payer: Medicare Other | Source: Ambulatory Visit | Attending: Internal Medicine | Admitting: Internal Medicine

## 2016-06-20 DIAGNOSIS — K869 Disease of pancreas, unspecified: Secondary | ICD-10-CM | POA: Diagnosis present

## 2016-06-20 DIAGNOSIS — N281 Cyst of kidney, acquired: Secondary | ICD-10-CM | POA: Insufficient documentation

## 2016-06-20 DIAGNOSIS — Z905 Acquired absence of kidney: Secondary | ICD-10-CM | POA: Insufficient documentation

## 2016-06-20 DIAGNOSIS — K7460 Unspecified cirrhosis of liver: Secondary | ICD-10-CM | POA: Insufficient documentation

## 2016-06-20 DIAGNOSIS — R161 Splenomegaly, not elsewhere classified: Secondary | ICD-10-CM | POA: Insufficient documentation

## 2016-06-20 DIAGNOSIS — K8689 Other specified diseases of pancreas: Secondary | ICD-10-CM

## 2016-06-20 MED ORDER — GADOBENATE DIMEGLUMINE 529 MG/ML IV SOLN
19.0000 mL | Freq: Once | INTRAVENOUS | Status: AC | PRN
  Administered 2016-06-20: 19 mL via INTRAVENOUS

## 2016-06-21 ENCOUNTER — Telehealth: Payer: Self-pay | Admitting: Internal Medicine

## 2016-06-21 ENCOUNTER — Telehealth: Payer: Self-pay

## 2016-06-21 ENCOUNTER — Other Ambulatory Visit: Payer: Self-pay

## 2016-06-21 ENCOUNTER — Other Ambulatory Visit: Payer: Self-pay | Admitting: Internal Medicine

## 2016-06-21 ENCOUNTER — Telehealth: Payer: Self-pay | Admitting: *Deleted

## 2016-06-21 ENCOUNTER — Encounter: Payer: Self-pay | Admitting: Internal Medicine

## 2016-06-21 NOTE — Telephone Encounter (Signed)
Apt entered for 07/06/16 at 830 am. This is the soonest available apt with Dr. Rogue Bussing - md approved this time/date. Spoke with daughter-this time will work for her schedule. Daughter understands that if results come available sooner, we will notify pt to come sooner than 5/11.

## 2016-06-21 NOTE — Telephone Encounter (Signed)
Pt's MRI imaging was reviewed at the tumor conference on 4/26; patient in the process of being scheduled for endoscopic ultrasound/biopsy thru navigator next week. Patient follow-up with me after the biopsy. Patient/family aware of the plan.

## 2016-06-21 NOTE — Telephone Encounter (Signed)
  Oncology Nurse Navigator Documentation Called and spoke with daughter, Butch Penny. EUS has been scheduled for 06/28/16 at [place] with Dr. Jola Schmidt from [place]. Went over insturctions for EUS and copy also mailed to patient home address. Denies anticoagulants. She is aware to hold all oral diabetic medications the day of her procedure. She will need appointment made for 3-4 days following for biopsy results with Dr. Rogue Bussing.  INSTRUCTIONS FOR ENDOSCOPIC ULTRASOUND -Your procedure has been scheduled for May 3rd with Dr. Francella Solian at [facility]. -The hospital will contact you to pre-register over the phone.  -To get your scheduled arrival time, please call the Endoscopy unit at  [phone] between 1-3 p.m. on:  May 2nd   -ON THE DAY OF YOU PROCEDURE:   1. If you are scheduled for a morning procedure, nothing to drink after midnight  -If you are scheduled for an afternoon procedure, you may have clear liquids until 5 hours prior  to the procedure but no carbonated drinks or broth  2. NO FOOD THE DAY OF YOUR PROCEDURE  3. You may take your heart, seizure, blood pressure, Parkinson's or breathing medications at  6am with just enough water to get your pills down  4. Do not take any oral Diabetic medications the morning of your procedure.  5. If you are a diabetic and are using insulin, please notify your prescribing physician of this  procedure as your dose may need to be altered related to not being able to eat or drink.   5. Do not take vitamins or fish oil for 5 days before your procedure     -On the day of your procedure, come to the [facility] Admitting/Registration desk (First desk on the right) at the scheduled arrival time. You MUST have someone drive you home from your procedure. You must have a responsible adult with a valid driver's license who is on site throughout your entire procedure and who can stay with you for several hours after your procedure. You may not  go home alone in a taxi, shuttle [place] or bus, as the drivers will not be responsible for you.  --If you have any questions please call me at the above contact     )

## 2016-06-21 NOTE — Telephone Encounter (Signed)
Daughter sent my chart msg. personally contacted daughter to review goals of care. Newly dx with pancreatic mass. Explained to daughter that Dr. Rogue Bussing will order an EUS-bx of pancreatic lesion. Explained to daughter that the physicians' from [place] will perform this procedure either here at [facility] or at [facility]. Daughter states that if the test could not be performed at [facility], she would fine ok to go to [facility] for the EUS procedure. Explained to daughter that MD would like to see pt a few days after EUS bx procedure for test results. Daughter will be notified of EUS procedure date by Mariea Clonts, Transport planner. Explained the role of navigation nurse to patient's daughter. She gave verbal understanding. Daughter notified that Dr. Rogue Bussing will add her mom to the [facility] case conference discussion today.

## 2016-06-21 NOTE — Telephone Encounter (Signed)
Pt has an apt on 5/11 per md discussion.

## 2016-06-22 ENCOUNTER — Other Ambulatory Visit: Payer: Self-pay

## 2016-06-27 ENCOUNTER — Encounter: Payer: Self-pay | Admitting: *Deleted

## 2016-06-28 ENCOUNTER — Encounter
Admission: RE | Disposition: A | Discharge status: Home / Self Care | Payer: Self-pay | Source: Ambulatory Visit | Attending: Gastroenterology

## 2016-06-28 ENCOUNTER — Ambulatory Visit: Payer: Medicare Other | Admitting: Anesthesiology

## 2016-06-28 ENCOUNTER — Ambulatory Visit
Admission: RE | Admit: 2016-06-28 | Discharge: 2016-06-28 | Disposition: A | Discharge status: Home / Self Care | Payer: Medicare Other | Source: Ambulatory Visit | Attending: Gastroenterology | Admitting: Gastroenterology

## 2016-06-28 ENCOUNTER — Encounter: Payer: Self-pay | Admitting: *Deleted

## 2016-06-28 DIAGNOSIS — C7889 Secondary malignant neoplasm of other digestive organs: Secondary | ICD-10-CM | POA: Diagnosis not present

## 2016-06-28 DIAGNOSIS — Z85528 Personal history of other malignant neoplasm of kidney: Secondary | ICD-10-CM | POA: Insufficient documentation

## 2016-06-28 DIAGNOSIS — Z85850 Personal history of malignant neoplasm of thyroid: Secondary | ICD-10-CM | POA: Insufficient documentation

## 2016-06-28 DIAGNOSIS — I10 Essential (primary) hypertension: Secondary | ICD-10-CM | POA: Insufficient documentation

## 2016-06-28 DIAGNOSIS — F419 Anxiety disorder, unspecified: Secondary | ICD-10-CM | POA: Diagnosis not present

## 2016-06-28 DIAGNOSIS — E119 Type 2 diabetes mellitus without complications: Secondary | ICD-10-CM | POA: Diagnosis not present

## 2016-06-28 DIAGNOSIS — Z87891 Personal history of nicotine dependence: Secondary | ICD-10-CM | POA: Diagnosis not present

## 2016-06-28 DIAGNOSIS — Z905 Acquired absence of kidney: Secondary | ICD-10-CM | POA: Insufficient documentation

## 2016-06-28 DIAGNOSIS — K219 Gastro-esophageal reflux disease without esophagitis: Secondary | ICD-10-CM | POA: Insufficient documentation

## 2016-06-28 DIAGNOSIS — D649 Anemia, unspecified: Secondary | ICD-10-CM | POA: Diagnosis not present

## 2016-06-28 DIAGNOSIS — K8689 Other specified diseases of pancreas: Secondary | ICD-10-CM | POA: Diagnosis present

## 2016-06-28 DIAGNOSIS — Z85038 Personal history of other malignant neoplasm of large intestine: Secondary | ICD-10-CM | POA: Insufficient documentation

## 2016-06-28 DIAGNOSIS — Z7984 Long term (current) use of oral hypoglycemic drugs: Secondary | ICD-10-CM | POA: Diagnosis not present

## 2016-06-28 DIAGNOSIS — Z79899 Other long term (current) drug therapy: Secondary | ICD-10-CM | POA: Insufficient documentation

## 2016-06-28 DIAGNOSIS — E039 Hypothyroidism, unspecified: Secondary | ICD-10-CM | POA: Diagnosis not present

## 2016-06-28 HISTORY — PX: EUS: SHX5427

## 2016-06-28 LAB — GLUCOSE, CAPILLARY: GLUCOSE-CAPILLARY: 126 mg/dL — AB (ref 65–99)

## 2016-06-28 SURGERY — ULTRASOUND, UPPER GI TRACT, ENDOSCOPIC
Anesthesia: General

## 2016-06-28 MED ORDER — PHENYLEPHRINE HCL 10 MG/ML IJ SOLN
INTRAMUSCULAR | Status: DC | PRN
  Administered 2016-06-28: 200 ug via INTRAVENOUS

## 2016-06-28 MED ORDER — GLYCOPYRROLATE 0.2 MG/ML IJ SOLN
INTRAMUSCULAR | Status: DC | PRN
  Administered 2016-06-28: 0.2 mg via INTRAVENOUS

## 2016-06-28 MED ORDER — PROPOFOL 10 MG/ML IV BOLUS
INTRAVENOUS | Status: DC | PRN
  Administered 2016-06-28: 60 mg via INTRAVENOUS

## 2016-06-28 MED ORDER — PROPOFOL 500 MG/50ML IV EMUL
INTRAVENOUS | Status: AC
  Filled 2016-06-28: qty 50

## 2016-06-28 MED ORDER — PROPOFOL 500 MG/50ML IV EMUL
INTRAVENOUS | Status: DC | PRN
  Administered 2016-06-28: 150 ug/kg/min via INTRAVENOUS

## 2016-06-28 MED ORDER — GLYCOPYRROLATE 0.2 MG/ML IJ SOLN
INTRAMUSCULAR | Status: AC
  Filled 2016-06-28: qty 1

## 2016-06-28 MED ORDER — LIDOCAINE HCL 2 % IJ SOLN
INTRAMUSCULAR | Status: AC
  Filled 2016-06-28: qty 10

## 2016-06-28 MED ORDER — FENTANYL CITRATE (PF) 100 MCG/2ML IJ SOLN
INTRAMUSCULAR | Status: AC
  Filled 2016-06-28: qty 2

## 2016-06-28 MED ORDER — PROPOFOL 10 MG/ML IV BOLUS
INTRAVENOUS | Status: AC
  Filled 2016-06-28: qty 20

## 2016-06-28 MED ORDER — SODIUM CHLORIDE 0.9 % IV SOLN
INTRAVENOUS | Status: DC
  Administered 2016-06-28: 13:00:00 via INTRAVENOUS

## 2016-06-28 MED ORDER — LIDOCAINE HCL (CARDIAC) 20 MG/ML IV SOLN
INTRAVENOUS | Status: DC | PRN
  Administered 2016-06-28: 100 mg via INTRAVENOUS

## 2016-06-28 NOTE — Op Note (Signed)
[facility] Gastroenterology Patient Name: Rebecca Kirby Procedure Date: 06/28/2016 1:14 PM MRN: [id number] Account #: [id number] Date of Birth: 10/30/1945 Admit Type: Outpatient Age: 71 Room: [facility] ENDO ROOM 3 Gender: Female Note Status: Finalized Procedure:            Upper EUS Indications:          Suspected mass in pancreas on CT scan, Abnormal                        ultrasound of the abdomen Providers:            Zada Girt Referring MD:         Leonie Douglas. Doy Hutching, MD (Referring MD), Charlaine Dalton (Referring MD) Medicines:            Monitored Anesthesia Care Complications:        No immediate complications. Procedure:            Pre-Anesthesia Assessment:                       - Please see pre-anesthesia assessment documentation                        already completed in Epic.                       After obtaining informed consent, the endoscope was                        passed under direct vision. Throughout the procedure,                        the patient's blood pressure, pulse, and oxygen                        saturations were monitored continuously. The EUS GI                        Linear Array [id number] was introduced through the mouth,                        and advanced to the duodenum for ultrasound examination                        from the stomach and duodenum. The Endoscope was                        introduced through the mouth, and advanced to the                        second part of duodenum. The upper EUS was accomplished                        without difficulty. The patient tolerated the procedure                        well. Findings:      Endoscopic Finding :      The examined esophagus was endoscopically normal.  Patchy mild mucosal changes characterized by erythema and nodularity       were found in the gastric antrum.      The examined duodenum was endoscopically normal.      Endosonographic  Finding :      An oval mass was identified in the genu of the pancreas. The mass was       heterogenous and distally enhancing. The mass measured 31 mm by 20 mm in       maximal cross-sectional diameter. The endosonographic borders were       well-defined. An intact interface was seen between the mass and the       portal vein, superior mesenteric vein and splenoportal confluence       suggesting a lack of invasion. The remainder of the pancreas was       examined. The endosonographic appearance of parenchyma and the upstream       pancreatic duct indicated no duct dilation and that the remainder of the       pancreas was unremarkable. PD body measured 2 mm and PD head 1 mm. Fine       needle biopsy was performed. Color Doppler imaging was utilized prior to       needle puncture to confirm a lack of significant vascular structures       within the needle path. Four passes were made with the 25 gauge       ultrasound Sharkcore biopsy needle using a transgastric approach. A       visible core of tissue was obtained. A preliminary cytologic examination       was performed. Final cytology results are pending.      There was no sign of significant endosonographic abnormality in the       common bile duct. The maximum diameter of the duct was 4 mm.      No abnormal-appearing lymph nodes were seen during endosonographic       examination in the celiac region (level 20), in the peripancreatic       region and in the porta hepatis region.      Endosonographic imaging in the visualized portion of the liver showed no       lesion. Impression:           - Normal esophagus.                       - Erythematous and nodular mucosa in the antrum.                        Previously biopsied at EGD last year.                       - Normal examined duodenum.                       EUS:                       - A mass was identified in the genu of the pancreas.                        Tissue was obtained from  this exam, and results are                        pending. However, the  endosonographic appearance is                        suggestive of a neuroendocrine tumor vs renal cell                        cancer; other pancreatic tumors are also in the                        differential diagnosis. Fine needle biopsy performed.                       - There was no sign of significant pathology in the                        common bile duct. Recommendation:       - Await cytology results.                       - Patient has a contact number available for                        emergencies. The signs and symptoms of potential                        delayed complications were discussed with the patient.                        Return to normal activities tomorrow. Written discharge                        instructions were provided to the patient.                       - Return to referring physician.                       - The findings and recommendations were discussed with                        the patient.                       - The findings and recommendations were discussed with                        the designated responsible adult. Procedure Code(s):    --- Professional ---                       [id number], Esophagogastroduodenoscopy, flexible, transoral;                        with transendoscopic ultrasound-guided intramural or                        transmural fine needle aspiration/biopsy(s), (includes                        endoscopic ultrasound examination limited to the                        esophagus, stomach or duodenum, and adjacent structures)  CPT copyright 2016 American Medical Association. All rights reserved. The codes documented in this report are preliminary and upon coder review may  be revised to meet current compliance requirements. Attending Participation:      I personally performed the entire procedure. Zada Girt,  06/28/2016 2:05:29 PM This report has been signed  electronically. Number of Addenda: 0 Note Initiated On: 06/28/2016 1:14 PM      [facility]

## 2016-06-28 NOTE — Anesthesia Postprocedure Evaluation (Signed)
Anesthesia Post Note  Patient: Rebecca Kirby  Procedure(s) Performed: Procedure(s) (LRB): FULL UPPER ENDOSCOPIC ULTRASOUND (EUS) RADIAL (N/A)  Patient location during evaluation: PACU Anesthesia Type: General Level of consciousness: awake Pain management: pain level controlled Vital Signs Assessment: post-procedure vital signs reviewed and stable Respiratory status: nonlabored ventilation Cardiovascular status: stable Anesthetic complications: no     Last Vitals:  Vitals:   06/28/16 1400 06/28/16 1409  BP: (!) 109/47 102/85  Pulse: 74 72  Resp: 20 15  Temp:      Last Pain:  Vitals:   06/28/16 1359  TempSrc: Tympanic                 VAN STAVEREN,Tagen Brethauer

## 2016-06-28 NOTE — Anesthesia Procedure Notes (Signed)
Performed by: Lance Muss Pre-anesthesia Checklist: Patient identified, Emergency Drugs available, Suction available, Patient being monitored and Timeout performed Patient Re-evaluated:Patient Re-evaluated prior to inductionOxygen Delivery Method: Nasal cannula Preoxygenation: Pre-oxygenation with 100% oxygen

## 2016-06-28 NOTE — H&P (Signed)
Outpatient short stay form Pre-procedure 06/28/2016 1:02 PM Rebecca Kirby,  Rebecca Hines, MD  Primary Physician: Idelle Crouch, MD   Reason for visit:  Pancreas mass for EUS FNA No chief complaint on file.   No diagnosis found.   History of present illness:  History of renal cell cancer and colon cancer. MRI and US show mass in pancreas. No abd pain.    Current Facility-Administered Medications:  .  0.9 %  sodium chloride infusion, , Intravenous, Continuous, Jola Schmidt, MD  Prescriptions Prior to Admission  Medication Sig Dispense Refill Last Dose  . amLODipine-olmesartan (AZOR) 5-20 MG per tablet Take 1 tablet by mouth daily.   06/28/2016 at 0900  . calcium acetate (PHOSLO) 667 MG capsule Take 667 mg by mouth 2 (two) times daily.    06/27/2016 at Unknown time  . Cholecalciferol (VITAMIN D-3) 1000 UNITS CAPS Take 1 capsule by mouth daily.   06/27/2016 at Unknown time  . citalopram (CELEXA) 40 MG tablet Take 1 tablet by mouth  daily 90 tablet 3 06/27/2016 at Unknown time  . Fe Fum-FePoly-FA-Vit C-Vit B3 (FOLIVANE-F) 125-1 MG CAPS Take 1 capsule by mouth 2 (two) times daily.   Past Week at Unknown time  . glipiZIDE (GLUCOTROL) 10 MG tablet Take 10 mg by mouth 2 (two) times daily before a meal.   06/27/2016 at Unknown time  . levothyroxine (SYNTHROID, LEVOTHROID) 175 MCG tablet Take 1 tablet by mouth daily.   06/27/2016 at Unknown time  . metFORMIN (GLUCOPHAGE) 1000 MG tablet Take 1,000 mg by mouth 2 (two) times daily with a meal.   06/27/2016 at Unknown time  . nebivolol (BYSTOLIC) 5 MG tablet Take 5 mg by mouth daily.   06/28/2016 at 0900  . pantoprazole (PROTONIX) 40 MG tablet Take 40 mg by mouth daily.   06/27/2016 at Unknown time  . pioglitazone (ACTOS) 30 MG tablet Take 30 mg by mouth daily.   06/27/2016 at Unknown time  . sitaGLIPtin (JANUVIA) 100 MG tablet Take 100 mg by mouth daily.   06/27/2016 at Unknown time  . vitamin B-12 (CYANOCOBALAMIN) 1000 MCG tablet Take 1,000 mcg by mouth daily. Reported on  07/07/2015   06/27/2016 at Unknown time  . HYDROcodone-acetaminophen (NORCO) 10-325 MG tablet Take 1 tablet by mouth every 6 (six) hours as needed for moderate pain.    Not Taking at Unknown time    No Known Allergies  Past Medical History:  Diagnosis Date  . Anemia   . Anxiety   . Colon cancer ([place])   . Diabetes mellitus without complication ([place])   . Family history of adverse reaction to anesthesia   . GERD (gastroesophageal reflux disease)   . Hypertension   . Hypothyroidism   . Kidney cancer, primary, with metastasis from kidney to other site [facility]) 08/26/2014  . Thyroid cancer ([place])     Review of systems:      Physical Exam    Heart and lungs: RRR; lungs clear    Pertinant exam for procedure: Abd soft mild tenderness eipgastrium.     Planned proceedures: EUS with possible FNA/B. Discussed with patient and she agrees to proceed.    Zada Girt MD Gastroenterology 06/28/2016  1:02 PM

## 2016-06-28 NOTE — Transfer of Care (Signed)
Immediate Anesthesia Transfer of Care Note  Patient: Rebecca Kirby  Procedure(s) Performed: Procedure(s): FULL UPPER ENDOSCOPIC ULTRASOUND (EUS) RADIAL (N/A)  Patient Location: PACU  Anesthesia Type:General  Level of Consciousness: sedated and responds to stimulation  Airway & Oxygen Therapy: Patient Spontanous Breathing and Patient connected to nasal cannula oxygen  Post-op Assessment: Report given to RN and Post -op Vital signs reviewed and stable  Post vital signs: Reviewed and stable  Last Vitals:  Vitals:   06/28/16 1359 06/28/16 1400  BP: (!) (P) 109/47 (!) 109/47  Pulse: (P) 73 74  Resp: (P) 16 20  Temp: (P) 36.6 C     Last Pain:  Vitals:   06/28/16 1359  TempSrc: (P) Tympanic         Complications: No apparent anesthesia complications

## 2016-06-28 NOTE — Anesthesia Preprocedure Evaluation (Signed)
Anesthesia Evaluation  Patient identified by MRN, date of birth, ID band Patient awake    Reviewed: Allergy & Precautions, NPO status , Patient's Chart, lab work & pertinent test results  Airway Mallampati: II       Dental  (+) Teeth Intact   Pulmonary former smoker,    breath sounds clear to auscultation       Cardiovascular Exercise Tolerance: Good hypertension, Pt. on medications  Rhythm:Regular     Neuro/Psych Anxiety    GI/Hepatic Neg liver ROS, GERD  ,  Endo/Other  diabetes, Type 2Hypothyroidism   Renal/GU Renal InsufficiencyRenal disease     Musculoskeletal   Abdominal (+) + obese,   Peds  Hematology  (+) anemia ,   Anesthesia Other Findings   Reproductive/Obstetrics                             Anesthesia Physical Anesthesia Plan  ASA: III  Anesthesia Plan: General   Post-op Pain Management:    Induction: Intravenous  Airway Management Planned: Natural Airway and Nasal Cannula  Additional Equipment:   Intra-op Plan:   Post-operative Plan:   Informed Consent: I have reviewed the patients History and Physical, chart, labs and discussed the procedure including the risks, benefits and alternatives for the proposed anesthesia with the patient or authorized representative who has indicated his/her understanding and acceptance.     Plan Discussed with: CRNA  Anesthesia Plan Comments:         Anesthesia Quick Evaluation

## 2016-06-28 NOTE — Anesthesia Post-op Follow-up Note (Cosign Needed)
Anesthesia QCDR form completed.        

## 2016-06-29 ENCOUNTER — Telehealth: Payer: Self-pay

## 2016-06-29 ENCOUNTER — Encounter: Payer: Self-pay | Admitting: Gastroenterology

## 2016-06-29 LAB — CYTOLOGY - NON PAP

## 2016-06-29 NOTE — Telephone Encounter (Signed)
  Oncology Nurse Navigator Documentation Received pathology from 06/29/16 EUS of pancreatic lesion. Pathology is metastatic renal cell carcinoma. Dr. Rogue Bussing called and notified.    Navigator Location: [facility] (06/29/16 1500)   )Navigator Encounter Type: Telephone (06/29/16 1500) Telephone: Outgoing Call;Diagnostic Results (06/29/16 1500)                                                  Time Spent with Patient: 15 (06/29/16 1500)

## 2016-07-02 ENCOUNTER — Encounter: Payer: Self-pay | Admitting: Internal Medicine

## 2016-07-02 ENCOUNTER — Telehealth: Payer: Self-pay | Admitting: Internal Medicine

## 2016-07-02 NOTE — Telephone Encounter (Signed)
Spoke to pt's daughter- re: the biopsy/prognosis treatment options. She will keep up appointment tomorrow with her mother in [place].

## 2016-07-03 ENCOUNTER — Inpatient Hospital Stay: Payer: Medicare Other | Attending: Internal Medicine | Admitting: Internal Medicine

## 2016-07-03 VITALS — BP 139/68 | HR 73 | Temp 97.6°F | Resp 18 | Ht 66.0 in | Wt 206.1 lb

## 2016-07-03 DIAGNOSIS — K7460 Unspecified cirrhosis of liver: Secondary | ICD-10-CM

## 2016-07-03 DIAGNOSIS — K219 Gastro-esophageal reflux disease without esophagitis: Secondary | ICD-10-CM

## 2016-07-03 DIAGNOSIS — E119 Type 2 diabetes mellitus without complications: Secondary | ICD-10-CM

## 2016-07-03 DIAGNOSIS — Z803 Family history of malignant neoplasm of breast: Secondary | ICD-10-CM | POA: Insufficient documentation

## 2016-07-03 DIAGNOSIS — I700 Atherosclerosis of aorta: Secondary | ICD-10-CM | POA: Diagnosis not present

## 2016-07-03 DIAGNOSIS — I129 Hypertensive chronic kidney disease with stage 1 through stage 4 chronic kidney disease, or unspecified chronic kidney disease: Secondary | ICD-10-CM | POA: Diagnosis not present

## 2016-07-03 DIAGNOSIS — Z7984 Long term (current) use of oral hypoglycemic drugs: Secondary | ICD-10-CM

## 2016-07-03 DIAGNOSIS — F419 Anxiety disorder, unspecified: Secondary | ICD-10-CM | POA: Diagnosis not present

## 2016-07-03 DIAGNOSIS — R197 Diarrhea, unspecified: Secondary | ICD-10-CM | POA: Insufficient documentation

## 2016-07-03 DIAGNOSIS — Z85850 Personal history of malignant neoplasm of thyroid: Secondary | ICD-10-CM

## 2016-07-03 DIAGNOSIS — Z87891 Personal history of nicotine dependence: Secondary | ICD-10-CM

## 2016-07-03 DIAGNOSIS — K439 Ventral hernia without obstruction or gangrene: Secondary | ICD-10-CM | POA: Insufficient documentation

## 2016-07-03 DIAGNOSIS — Z85038 Personal history of other malignant neoplasm of large intestine: Secondary | ICD-10-CM

## 2016-07-03 DIAGNOSIS — C641 Malignant neoplasm of right kidney, except renal pelvis: Secondary | ICD-10-CM | POA: Diagnosis present

## 2016-07-03 DIAGNOSIS — Z79899 Other long term (current) drug therapy: Secondary | ICD-10-CM

## 2016-07-03 DIAGNOSIS — N183 Chronic kidney disease, stage 3 (moderate): Secondary | ICD-10-CM | POA: Insufficient documentation

## 2016-07-03 DIAGNOSIS — R5383 Other fatigue: Secondary | ICD-10-CM | POA: Diagnosis not present

## 2016-07-03 DIAGNOSIS — R918 Other nonspecific abnormal finding of lung field: Secondary | ICD-10-CM | POA: Diagnosis not present

## 2016-07-03 DIAGNOSIS — D509 Iron deficiency anemia, unspecified: Secondary | ICD-10-CM

## 2016-07-03 DIAGNOSIS — C7889 Secondary malignant neoplasm of other digestive organs: Secondary | ICD-10-CM | POA: Diagnosis not present

## 2016-07-03 DIAGNOSIS — R911 Solitary pulmonary nodule: Secondary | ICD-10-CM

## 2016-07-03 NOTE — Progress Notes (Signed)
Rebecca Kirby  Patient Care Team: Idelle Crouch, MD as PCP - General (Internal Medicine) Clent Jacks, RN as Registered Nurse   Oncology History   [phone]- Rebecca Kirby [s/p Right Nephrectomy; Dr.Stoiff]; Clear cell; pT3pNx  # DEC 2015- s/p Thyroidectomy [Dr.Marterre ] Incidetal- 0.41m focus of RCC in R thyroid lobe; Multifocal benign follicular adenomatous nodules  # MAY 2018- RCC met to PANCREAS [incidental-US/MRI; noted on prior PET oct 2017- non-avid] s/p EUS Bx [Dr.Jowel]  # Iron deficiency anemia requiring blood transfusion and intravenous iron therapy (May of 2017)  # Cirrhosis of liver [PET June 2017]- AUG 31st 2017- MRI liver-cirrhosis/no lesions [Dr.Elliot] # 2008- SIGMOID COLON CA pT2/STAGE- I [IDA]  # # 2017- Lipoma resection [Dr.Mcqueen]     [place]    INTERVAL HISTORY:    Rebecca [name] pleasant patient above history of colon cancer and also history of kidney cancer metastatic to the thyroid status post resection stage IV NED; Incidentally noted to have pancreatic mass on ultrasound that was done for her cirrhosis.   This led to an MRI of the abdomen- that incidentally showed a 3 x 2 cm mass in the pancreas without any obstructive findings. EUS- was performed- positive for metastatic clear cell renal carcinoma.  Patient is accompanied by her daughter to discuss further treatment options.   She complains of fatigue. No blood in stools or black stools. No new shortness of breath or chest pain. Her energy levels have improved post IV iron.   REVIEW OF SYSTEMS:  A complete 10 point review of system is done which is negative except mentioned above/history of present illness.   PAST MEDICAL HISTORY :  Past Medical History:  Diagnosis Date  . Anemia   . Anxiety   . Colon cancer ([place]   . Diabetes mellitus without complication ([place]   . Family history of adverse reaction to anesthesia   . GERD  (gastroesophageal reflux disease)   . Hypertension   . Hypothyroidism   . Kidney cancer, primary, with metastasis from kidney to other site ([facility] 08/26/2014  . Thyroid cancer ([place]     PAST SURGICAL HISTORY :   Past Surgical History:  Procedure Laterality Date  . CHOLECYSTECTOMY    . COLON RESECTION    . COLONOSCOPY WITH PROPOFOL N/A 09/01/2015   Procedure: COLONOSCOPY WITH PROPOFOL;  Surgeon: RManya Silvas MD;  [facility]  Service: Endoscopy;  Laterality: N/A;  . ESOPHAGOGASTRODUODENOSCOPY (EGD) WITH PROPOFOL N/A 09/01/2015   Procedure: ESOPHAGOGASTRODUODENOSCOPY (EGD) WITH PROPOFOL;  Surgeon: RManya Silvas MD;  [facility]  Service: Endoscopy;  Laterality: N/A;  . EUS N/A 06/28/2016   Procedure: FULL UPPER ENDOSCOPIC ULTRASOUND (EUS) RADIAL;  Surgeon: PJola Schmidt MD;  Location: [facility] ENDOSCOPY;  Service: Endoscopy;  Laterality: N/A;  . EXCISION MASS NECK N/A 09/20/2015   Procedure: EXCISION MASS NECK;  Surgeon: CBeverly Gust MD;  [place]  Service: E[place];  Laterality: N/A;  . rt kidney removed Right   . THYROGLOSSAL DUCT CYST Right 09/20/2015   Procedure: excision sub mandibular gland right;  Surgeon: CBeverly Gust MD;  [place]  Service: ENT;  Laterality: Right;  . THYROID SURGERY Bilateral   . TOTAL KNEE ARTHROPLASTY Right   . TOTAL KNEE ARTHROPLASTY Left 08/09/2014   Procedure: TOTAL KNEE ARTHROPLASTY;  Surgeon: JDereck Leep MD;  Location: [facility] ORS;  Service: Orthopedics;  Laterality: Left;  FAMILY HISTORY :   Family History  Problem Relation Age of Onset  . Breast cancer Maternal Aunt   . Breast cancer Cousin     SOCIAL HISTORY:   Social History  Substance Use Topics  . Smoking status: Former Smoker    Quit date: 07/29/1994  . Smokeless tobacco: Never Used  . Alcohol use No    ALLERGIES:  has No Known Allergies.  MEDICATIONS:  Current Outpatient Prescriptions  Medication Sig Dispense  Refill  . amLODipine-olmesartan (AZOR) 5-20 MG per tablet Take 1 tablet by mouth daily.    . calcium acetate (PHOSLO) 667 MG capsule Take 667 mg by mouth 2 (two) times daily.     . Cholecalciferol (VITAMIN D-3) 1000 UNITS CAPS Take 1 capsule by mouth daily.    . citalopram (CELEXA) 40 MG tablet Take 1 tablet by mouth  daily 90 tablet 3  . glipiZIDE (GLUCOTROL) 10 MG tablet Take 10 mg by mouth 2 (two) times daily before a meal.    . levothyroxine (SYNTHROID, LEVOTHROID) 175 MCG tablet Take 1 tablet by mouth daily.    . metFORMIN (GLUCOPHAGE) 1000 MG tablet Take 1,000 mg by mouth 2 (two) times daily with a meal.    . nebivolol (BYSTOLIC) 5 MG tablet Take 5 mg by mouth daily.    . pantoprazole (PROTONIX) 40 MG tablet Take 40 mg by mouth daily.    . pioglitazone (ACTOS) 30 MG tablet Take 30 mg by mouth daily.    . sitaGLIPtin (JANUVIA) 100 MG tablet Take 100 mg by mouth daily.    . vitamin B-12 (CYANOCOBALAMIN) 1000 MCG tablet Take 1,000 mcg by mouth daily. Reported on 07/07/2015    . Fe Fum-FePoly-FA-Vit C-Vit B3 (FOLIVANE-F) 125-1 MG CAPS Take 1 capsule by mouth 2 (two) times daily.    Marland Kitchen HYDROcodone-acetaminophen (NORCO) 10-325 MG tablet Take 1 tablet by mouth every 6 (six) hours as needed for moderate pain.      No current facility-administered medications for this visit.     PHYSICAL EXAMINATION: ECOG PERFORMANCE STATUS: 0 - Asymptomatic  BP 139/68   Pulse 73   Temp 97.6 F (36.4 C) (Tympanic)   Resp 18   Ht _0  (1.676 m)   Wt 206 lb 2.1 oz (93.5 kg)   BMI 33.27 kg/m   Filed Weights   07/03/16 0837  Weight: 206 lb 2.1 oz (93.5 kg)    GENERAL: Well-nourished well-developed; Alert, no distress and comfortable.  Alone.  EYES: no pallor or icterus OROPHARYNX: no thrush or ulceration; good dentition  NECK: supple, obvious neck mass noted in midline [lipoma] LYMPH:  no palpable lymphadenopathy in the cervical, axillary or inguinal regions LUNGS: clear to auscultation and  No  wheeze or crackles HEART/CVS: regular rate & rhythm and no murmurs; Bil +2 lower extremity edema ABDOMEN:abdomen soft, non-tender and normal bowel sounds Musculoskeletal:no cyanosis of digits and no clubbing  PSYCH: alert & oriented x 3 with fluent speech NEURO: no focal motor/sensory deficits SKIN:  no rashes or significant lesions  LABORATORY DATA:  I have reviewed the data as listed    Component Value Date/Time   NA 135 04/30/2016 1308   NA 138 03/05/2014 0935   K 4.9 04/30/2016 1308   K 4.6 03/05/2014 0935   CL 103 04/30/2016 1308   CL 103 03/05/2014 0935   CO2 26 04/30/2016 1308   CO2 25 03/05/2014 0935   GLUCOSE 201 (H) 04/30/2016 1308   GLUCOSE 200 (H) 03/05/2014 0935  BUN 15 04/30/2016 1308   BUN 20 (H) 03/05/2014 0935   CREATININE 1.03 (H) 04/30/2016 1308   CREATININE 1.46 (H) 03/05/2014 0935   CALCIUM 8.7 (L) 04/30/2016 1308   CALCIUM 9.5 03/05/2014 0935   PROT 7.1 01/26/2016 0942   PROT 7.3 02/02/2014 1454   ALBUMIN 3.7 01/26/2016 0942   ALBUMIN 3.1 (L) 02/10/2014 0410   AST 22 01/26/2016 0942   AST 35 02/02/2014 1454   ALT 13 (L) 01/26/2016 0942   ALT 27 02/02/2014 1454   ALKPHOS 72 01/26/2016 0942   ALKPHOS 72 02/02/2014 1454   BILITOT 0.7 01/26/2016 0942   BILITOT 0.2 02/02/2014 1454   GFRNONAA 53 (L) 04/30/2016 1308   GFRNONAA 38 (L) 03/05/2014 0935   GFRNONAA 50 (L) 10/02/2013 1027   GFRAA >60 04/30/2016 1308   GFRAA 46 (L) 03/05/2014 0935   GFRAA 58 (L) 10/02/2013 1027    No results found for: SPEP, UPEP  Lab Results  Component Value Date   WBC 11.2 (H) 06/13/2016   NEUTROABS 9.0 (H) 06/13/2016   HGB 10.8 (L) 06/13/2016   HCT 32.3 (L) 06/13/2016   MCV 84.8 06/13/2016   PLT 214 06/13/2016      Chemistry      Component Value Date/Time   NA 135 04/30/2016 1308   NA 138 03/05/2014 0935   K 4.9 04/30/2016 1308   K 4.6 03/05/2014 0935   CL 103 04/30/2016 1308   CL 103 03/05/2014 0935   CO2 26 04/30/2016 1308   CO2 25 03/05/2014 0935    BUN 15 04/30/2016 1308   BUN 20 (H) 03/05/2014 0935   CREATININE 1.03 (H) 04/30/2016 1308   CREATININE 1.46 (H) 03/05/2014 0935      Component Value Date/Time   CALCIUM 8.7 (L) 04/30/2016 1308   CALCIUM 9.5 03/05/2014 0935   ALKPHOS 72 01/26/2016 0942   ALKPHOS 72 02/02/2014 1454   AST 22 01/26/2016 0942   AST 35 02/02/2014 1454   ALT 13 (L) 01/26/2016 0942   ALT 27 02/02/2014 1454   BILITOT 0.7 01/26/2016 0942   BILITOT 0.2 02/02/2014 1454       RADIOGRAPHIC STUDIES: I have personally reviewed the radiological images as listed and agreed with the findings in the report. No results found.   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 MSKCC- Good Risk. Reviewed the pathology; imaging detail. Recommend CT of the chest to rule out any metastatic disease to the lungs.  # In general renal cancer metastatic to pancreas - tend to behave in an indolent fashion. Discussed multiple options- including-Whipple's/radiation [high risk of morbidity];  immunotherapy versus TKI- as potential therapies. Also discussed with Dr.McNamara.   # Iron deficiency anemia-  status post IV iron; on a monthly basis. Today hemoglobin is 10.5.  # Colon ca- stage I- no evidence of recurrence.  # Cirrhosis/ ? NASH- worked up with [place]. No decompensation noted.  # follow up in 2 weeks/labs/ IV ferrheam/ CT scan prior- [facility] referral.   # I reviewed the blood work- with the patient in detail; also reviewed the imaging independently [as summarized above]; and with the patient in detail.   Orders Placed This Encounter  Procedures  . CT CHEST WO CONTRAST    Standing Status:   Future    Standing Expiration Date:   09/02/2017    Order Specific Question:   Reason for Exam (SYMPTOM  OR DIAGNOSIS REQUIRED)  Answer:   renal cancer    Order Specific Question:   Preferred imaging location?    Answer:   [facility]  . Ambulatory referral to  Oncology    Referral Priority:   Routine    Referral Type:   Consultation    Referral Reason:   Specialty Services Required    Referred to Provider:   Felecia Shelling, MD    Requested Specialty:   Oncology    Number of Visits Requested:   1   All questions were answered. The patient knows to call the clinic with any problems, questions or concerns.      Cammie Sickle, MD 07/03/2016 3:46 PM

## 2016-07-03 NOTE — Assessment & Plan Note (Addendum)
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 MSKCC- Good Risk. Reviewed the pathology; imaging detail. Recommend CT of the chest to rule out any metastatic disease to the lungs.  # In general renal cancer metastatic to pancreas - tend to behave in an indolent fashion. Discussed multiple options- including-Whipple's/radiation [high risk of morbidity];  immunotherapy versus TKI- as potential therapies. Also discussed with Dr.McNamara.   # Iron deficiency anemia-  status post IV iron; on a monthly basis. Today hemoglobin is 10.5.  # Colon ca- stage I- no evidence of recurrence.  # Cirrhosis/ ? NASH- worked up with [place]. No decompensation noted.  # follow up in 2 weeks/labs/ IV ferrheam/ CT scan prior- [facility] referral.   # I reviewed the blood work- with the patient in detail; also reviewed the imaging independently [as summarized above]; and with the patient in detail.

## 2016-07-03 NOTE — Progress Notes (Signed)
Patient here for follow-up for the EUS biopsy results. She is accompanied by her daughter today. The patient has no medical complaints.

## 2016-07-06 ENCOUNTER — Ambulatory Visit: Payer: Medicare Other | Admitting: Internal Medicine

## 2016-07-13 ENCOUNTER — Ambulatory Visit: Payer: Medicare Other

## 2016-07-16 ENCOUNTER — Inpatient Hospital Stay: Payer: Medicare Other

## 2016-07-16 ENCOUNTER — Ambulatory Visit
Admission: RE | Admit: 2016-07-16 | Discharge: 2016-07-16 | Disposition: A | Discharge status: Home / Self Care | Payer: Medicare Other | Source: Ambulatory Visit | Attending: Internal Medicine | Admitting: Internal Medicine

## 2016-07-16 DIAGNOSIS — R911 Solitary pulmonary nodule: Secondary | ICD-10-CM

## 2016-07-16 DIAGNOSIS — I2510 Atherosclerotic heart disease of native coronary artery without angina pectoris: Secondary | ICD-10-CM | POA: Insufficient documentation

## 2016-07-16 DIAGNOSIS — D500 Iron deficiency anemia secondary to blood loss (chronic): Secondary | ICD-10-CM

## 2016-07-16 DIAGNOSIS — C641 Malignant neoplasm of right kidney, except renal pelvis: Secondary | ICD-10-CM | POA: Diagnosis not present

## 2016-07-16 DIAGNOSIS — R918 Other nonspecific abnormal finding of lung field: Secondary | ICD-10-CM | POA: Insufficient documentation

## 2016-07-16 DIAGNOSIS — I700 Atherosclerosis of aorta: Secondary | ICD-10-CM | POA: Diagnosis not present

## 2016-07-16 LAB — COMPREHENSIVE METABOLIC PANEL
ALBUMIN: 3.8 g/dL (ref 3.5–5.0)
ALT: 14 U/L (ref 14–54)
ANION GAP: 10 (ref 5–15)
AST: 29 U/L (ref 15–41)
Alkaline Phosphatase: 53 U/L (ref 38–126)
BUN: 20 mg/dL (ref 6–20)
CALCIUM: 8.9 mg/dL (ref 8.9–10.3)
CHLORIDE: 101 mmol/L (ref 101–111)
CO2: 22 mmol/L (ref 22–32)
Creatinine, Ser: 1.14 mg/dL — ABNORMAL HIGH (ref 0.44–1.00)
GFR calc non Af Amer: 47 mL/min — ABNORMAL LOW (ref >60)
GFR, EST AFRICAN AMERICAN: 54 mL/min — AB (ref >60)
Glucose, Bld: 332 mg/dL — ABNORMAL HIGH (ref 65–99)
POTASSIUM: 4.4 mmol/L (ref 3.5–5.1)
SODIUM: 133 mmol/L — AB (ref 135–145)
Total Bilirubin: 0.7 mg/dL (ref 0.3–1.2)
Total Protein: 6.9 g/dL (ref 6.5–8.1)

## 2016-07-16 LAB — CBC WITH DIFFERENTIAL/PLATELET
BASOS ABS: 0 10*3/uL (ref 0–0.1)
BASOS PCT: 1 %
EOS ABS: 0.1 10*3/uL (ref 0–0.7)
Eosinophils Relative: 2 %
HCT: 33.4 % — ABNORMAL LOW (ref 35.0–47.0)
HEMOGLOBIN: 10.8 g/dL — AB (ref 12.0–16.0)
Lymphocytes Relative: 11 %
Lymphs Abs: 0.9 10*3/uL — ABNORMAL LOW (ref 1.0–3.6)
MCH: 28.3 pg (ref 26.0–34.0)
MCHC: 32.3 g/dL (ref 32.0–36.0)
MCV: 87.4 fL (ref 80.0–100.0)
MONOS PCT: 4 %
Monocytes Absolute: 0.3 10*3/uL (ref 0.2–0.9)
NEUTROS PCT: 82 %
Neutro Abs: 6.5 10*3/uL (ref 1.4–6.5)
Platelets: 177 10*3/uL (ref 150–440)
RBC: 3.82 MIL/uL (ref 3.80–5.20)
RDW: 17 % — ABNORMAL HIGH (ref 11.5–14.5)
WBC: 7.8 10*3/uL (ref 3.6–11.0)

## 2016-07-16 LAB — IRON AND TIBC
IRON: 34 ug/dL (ref 28–170)
Saturation Ratios: 9 % — ABNORMAL LOW (ref 10.4–31.8)
TIBC: 362 ug/dL (ref 250–450)
UIBC: 328 ug/dL

## 2016-07-16 LAB — FERRITIN: Ferritin: 29 ng/mL (ref 11–307)

## 2016-07-17 ENCOUNTER — Telehealth: Payer: Self-pay | Admitting: Internal Medicine

## 2016-07-17 ENCOUNTER — Inpatient Hospital Stay (HOSPITAL_BASED_OUTPATIENT_CLINIC_OR_DEPARTMENT_OTHER): Payer: Medicare Other | Admitting: Internal Medicine

## 2016-07-17 ENCOUNTER — Ambulatory Visit: Payer: Medicare Other | Admitting: Internal Medicine

## 2016-07-17 ENCOUNTER — Inpatient Hospital Stay: Payer: Medicare Other

## 2016-07-17 ENCOUNTER — Ambulatory Visit: Payer: Medicare Other

## 2016-07-17 VITALS — BP 150/68 | HR 71 | Temp 96.8°F | Resp 18 | Wt 206.1 lb

## 2016-07-17 VITALS — BP 133/72 | HR 74 | Temp 96.8°F | Resp 18 | Wt 206.1 lb

## 2016-07-17 DIAGNOSIS — I700 Atherosclerosis of aorta: Secondary | ICD-10-CM

## 2016-07-17 DIAGNOSIS — Z79899 Other long term (current) drug therapy: Secondary | ICD-10-CM | POA: Diagnosis not present

## 2016-07-17 DIAGNOSIS — Z7984 Long term (current) use of oral hypoglycemic drugs: Secondary | ICD-10-CM

## 2016-07-17 DIAGNOSIS — D509 Iron deficiency anemia, unspecified: Secondary | ICD-10-CM | POA: Diagnosis not present

## 2016-07-17 DIAGNOSIS — C7889 Secondary malignant neoplasm of other digestive organs: Secondary | ICD-10-CM | POA: Diagnosis not present

## 2016-07-17 DIAGNOSIS — R197 Diarrhea, unspecified: Secondary | ICD-10-CM

## 2016-07-17 DIAGNOSIS — F419 Anxiety disorder, unspecified: Secondary | ICD-10-CM

## 2016-07-17 DIAGNOSIS — E119 Type 2 diabetes mellitus without complications: Secondary | ICD-10-CM | POA: Diagnosis not present

## 2016-07-17 DIAGNOSIS — Z85038 Personal history of other malignant neoplasm of large intestine: Secondary | ICD-10-CM

## 2016-07-17 DIAGNOSIS — K219 Gastro-esophageal reflux disease without esophagitis: Secondary | ICD-10-CM

## 2016-07-17 DIAGNOSIS — Z87891 Personal history of nicotine dependence: Secondary | ICD-10-CM

## 2016-07-17 DIAGNOSIS — K7460 Unspecified cirrhosis of liver: Secondary | ICD-10-CM | POA: Diagnosis not present

## 2016-07-17 DIAGNOSIS — I129 Hypertensive chronic kidney disease with stage 1 through stage 4 chronic kidney disease, or unspecified chronic kidney disease: Secondary | ICD-10-CM | POA: Diagnosis not present

## 2016-07-17 DIAGNOSIS — R918 Other nonspecific abnormal finding of lung field: Secondary | ICD-10-CM

## 2016-07-17 DIAGNOSIS — N183 Chronic kidney disease, stage 3 (moderate): Secondary | ICD-10-CM

## 2016-07-17 DIAGNOSIS — R5383 Other fatigue: Secondary | ICD-10-CM

## 2016-07-17 DIAGNOSIS — D500 Iron deficiency anemia secondary to blood loss (chronic): Secondary | ICD-10-CM

## 2016-07-17 DIAGNOSIS — D638 Anemia in other chronic diseases classified elsewhere: Secondary | ICD-10-CM

## 2016-07-17 DIAGNOSIS — C641 Malignant neoplasm of right kidney, except renal pelvis: Secondary | ICD-10-CM

## 2016-07-17 DIAGNOSIS — Z803 Family history of malignant neoplasm of breast: Secondary | ICD-10-CM

## 2016-07-17 DIAGNOSIS — K439 Ventral hernia without obstruction or gangrene: Secondary | ICD-10-CM

## 2016-07-17 DIAGNOSIS — Z85850 Personal history of malignant neoplasm of thyroid: Secondary | ICD-10-CM

## 2016-07-17 MED ORDER — SODIUM CHLORIDE 0.9 % IV SOLN
510.0000 mg | Freq: Once | INTRAVENOUS | Status: AC
  Administered 2016-07-17: 510 mg via INTRAVENOUS
  Filled 2016-07-17: qty 17

## 2016-07-17 MED ORDER — SODIUM CHLORIDE 0.9 % IV SOLN
Freq: Once | INTRAVENOUS | Status: AC
  Administered 2016-07-17: 15:00:00 via INTRAVENOUS
  Filled 2016-07-17: qty 1000

## 2016-07-17 NOTE — Progress Notes (Signed)
[place] OFFICE PROGRESS NOTE  Patient Care Team: Idelle Crouch, MD as PCP - General (Internal Medicine) Clent Jacks, RN as Registered Nurse   Oncology History   [phone]- [facility] [s/p Right Nephrectomy; Dr.Stoiff]; Clear cell; pT3pNx  # DEC 2015- s/p Thyroidectomy [Dr.Marterre ] Incidetal- 0.79m focus of RCC in R thyroid lobe; Multifocal benign follicular adenomatous nodules  # MAY 2018- RCC met to PANCREAS [incidental-US/MRI; noted on prior PET oct 2017- non-avid] s/p EUS Bx [Dr.Jowel]  # Iron deficiency anemia requiring blood transfusion and intravenous iron therapy (May of 2017)  # Cirrhosis of liver [PET June 2017]- AUG 31st 2017- MRI liver-cirrhosis/no lesions [Dr.Elliot] # 2008- SIGMOID COLON CA pT2/STAGE- I [IDA]  # # 2017- Lipoma resection [Dr.Mcqueen]     Cancer of right kidney ([place]    INTERVAL HISTORY:    Rebecca [id number].[id number].  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   [facility]   Patient complains of continued fatigue; however denies any blood in stools or black stools. She is intolerant to by mouth iron. No new shortness of breath or chest pain. Her energy levels have improved post IV iron.   Patient has chronic intermittent diarrhea ["up to 7 loose stools on a bad day"]- patient has approximately 2-3 "bad days " a week.   Patient is accompanied by her daughter to discuss further treatment options.    REVIEW OF SYSTEMS:  A complete 10 point review of system is done which is negative except mentioned above/history of present illness.   PAST MEDICAL HISTORY :  Past Medical History:  Diagnosis Date  . Anemia   . Anxiety   . [place] cancer ([place]   . Diabetes mellitus without complication ([place]   . Family history of adverse reaction to anesthesia   . GERD (gastroesophageal reflux disease)   .  Hypertension   . Hypothyroidism   . Kidney cancer, primary, with metastasis from kidney to other site ([facility] 08/26/2014  . [place]     PAST SURGICAL HISTORY :   Past Surgical History:  Procedure Laterality Date  . CHOLECYSTECTOMY    . COLON RESECTION    . COLONOSCOPY WITH PROPOFOL N/A 09/01/2015   Procedure: COLONOSCOPY WITH PROPOFOL;  Surgeon: RManya Silvas MD;  [facility]  Service: Endoscopy;  Laterality: N/A;  . ESOPHAGOGASTRODUODENOSCOPY (EGD) WITH PROPOFOL N/A 09/01/2015   Procedure: ESOPHAGOGASTRODUODENOSCOPY (EGD) WITH PROPOFOL;  Surgeon: RManya Silvas MD;  [facility];  Service: Endoscopy;  Laterality: N/A;  . EUS N/A 06/28/2016   Procedure: FULL UPPER ENDOSCOPIC ULTRASOUND (EUS) RADIAL;  Surgeon: PJola Schmidt MD;  Location: [facility] ENDOSCOPY;  Service: Endoscopy;  Laterality: N/A;  . EXCISION MASS NECK N/A 09/20/2015   Procedure: EXCISION MASS NECK;  Surgeon: CBeverly Gust MD;  [facility]  Service: ENT;  Laterality: N/A;  . rt kidney removed Right   . THYROGLOSSAL DUCT CYST Right 09/20/2015   Procedure: excision sub mandibular gland right;  Surgeon: CBeverly Gust MD;  [facility]  Service: ENT;  Laterality: Right;  . THYROID SURGERY Bilateral   . TOTAL KNEE ARTHROPLASTY Right   . TOTAL KNEE ARTHROPLASTY Left 08/09/2014   Procedure: TOTAL KNEE ARTHROPLASTY;  Surgeon: JDereck Leep MD;  Location: [facility] ORS;  Service: Orthopedics;  Laterality: Left;    FAMILY HISTORY :   Family  History  Problem Relation Age of Onset  . Breast cancer Maternal Aunt   . Breast cancer Cousin     SOCIAL HISTORY:   Social History  Substance Use Topics  . Smoking status: Former Smoker    Quit date: 07/29/1994  . Smokeless tobacco: Never Used  . Alcohol use No    ALLERGIES:  has No Known Allergies.  MEDICATIONS:  Current Outpatient Prescriptions  Medication Sig Dispense Refill  . ALPRAZolam (XANAX) 0.5 MG  tablet     . amLODipine-olmesartan (AZOR) 5-20 MG per tablet Take 1 tablet by mouth daily.    . calcium acetate (PHOSLO) 667 MG capsule Take 667 mg by mouth 2 (two) times daily.     . Cholecalciferol (VITAMIN D-3) 1000 UNITS CAPS Take 1 capsule by mouth daily.    . citalopram (CELEXA) 40 MG tablet Take 1 tablet by mouth  daily 90 tablet 3  . Fe Fum-FePoly-FA-Vit C-Vit B3 (FOLIVANE-F) 125-1 MG CAPS Take 1 capsule by mouth 2 (two) times daily.    Marland Kitchen glipiZIDE (GLUCOTROL XL) 10 MG 24 hr tablet     . levothyroxine (SYNTHROID, LEVOTHROID) 175 MCG tablet Take 1 tablet by mouth daily.    . metFORMIN (GLUCOPHAGE) 1000 MG tablet Take 1,000 mg by mouth 2 (two) times daily with a meal.    . nebivolol (BYSTOLIC) 5 MG tablet Take 5 mg by mouth daily.    . pantoprazole (PROTONIX) 40 MG tablet Take 40 mg by mouth daily.    . pioglitazone (ACTOS) 30 MG tablet Take 30 mg by mouth daily.    . sitaGLIPtin (JANUVIA) 100 MG tablet Take 100 mg by mouth daily.    . vitamin B-12 (CYANOCOBALAMIN) 1000 MCG tablet Take 1,000 mcg by mouth daily. Reported on 07/07/2015    . HYDROcodone-acetaminophen (NORCO) 10-325 MG tablet Take 1 tablet by mouth every 6 (six) hours as needed for moderate pain.      No current facility-administered medications for this visit.     PHYSICAL EXAMINATION: ECOG PERFORMANCE STATUS: 0 - Asymptomatic  BP 133/72 (BP Location: Left Arm, Patient Position: Sitting)   Pulse 74   Temp (!) 96.8 F (36 C) (Tympanic)   Resp 18   Wt 206 lb 2.1 oz (93.5 kg)   BMI 33.27 kg/m   Filed Weights   07/17/16 1403  Weight: 206 lb 2.1 oz (93.5 kg)    GENERAL: Well-nourished well-developed; Alert, no distress and comfortable.  Alone.  EYES: no pallor or icterus OROPHARYNX: no thrush or ulceration; good dentition  NECK: supple, obvious neck mass noted in midline [lipoma] LYMPH:  no palpable lymphadenopathy in the cervical, axillary or inguinal regions LUNGS: clear to auscultation and  No wheeze or  crackles HEART/CVS: regular rate & rhythm and no murmurs; Bil +2 lower extremity edema ABDOMEN:abdomen soft, non-tender and normal bowel sounds Musculoskeletal:no cyanosis of digits and no clubbing  PSYCH: alert & oriented x 3 with fluent speech NEURO: no focal motor/sensory deficits SKIN:  no rashes or significant lesions  LABORATORY DATA:  I have reviewed the data as listed    Component Value Date/Time   NA 133 (L) 07/16/2016 0850   NA 138 03/05/2014 0935   K 4.4 07/16/2016 0850   K 4.6 03/05/2014 0935   CL 101 07/16/2016 0850   CL 103 03/05/2014 0935   CO2 22 07/16/2016 0850   CO2 25 03/05/2014 0935   GLUCOSE 332 (H) 07/16/2016 0850   GLUCOSE 200 (H) 03/05/2014 0935   BUN 20  07/16/2016 0850   BUN 20 (H) 03/05/2014 0935   CREATININE 1.14 (H) 07/16/2016 0850   CREATININE 1.46 (H) 03/05/2014 0935   CALCIUM 8.9 07/16/2016 0850   CALCIUM 9.5 03/05/2014 0935   PROT 6.9 07/16/2016 0850   PROT 7.3 02/02/2014 1454   ALBUMIN 3.8 07/16/2016 0850   ALBUMIN 3.1 (L) 02/10/2014 0410   AST 29 07/16/2016 0850   AST 35 02/02/2014 1454   ALT 14 07/16/2016 0850   ALT 27 02/02/2014 1454   ALKPHOS 53 07/16/2016 0850   ALKPHOS 72 02/02/2014 1454   BILITOT 0.7 07/16/2016 0850   BILITOT 0.2 02/02/2014 1454   GFRNONAA 47 (L) 07/16/2016 0850   GFRNONAA 38 (L) 03/05/2014 0935   GFRNONAA 50 (L) 10/02/2013 1027   GFRAA 54 (L) 07/16/2016 0850   GFRAA 46 (L) 03/05/2014 0935   GFRAA 58 (L) 10/02/2013 1027    No results found for: SPEP, UPEP  Lab Results  Component Value Date   WBC 7.8 07/16/2016   NEUTROABS 6.5 07/16/2016   HGB 10.8 (L) 07/16/2016   HCT 33.4 (L) 07/16/2016   MCV 87.4 07/16/2016   PLT 177 07/16/2016      Chemistry      Component Value Date/Time   NA 133 (L) 07/16/2016 0850   NA 138 03/05/2014 0935   K 4.4 07/16/2016 0850   K 4.6 03/05/2014 0935   CL 101 07/16/2016 0850   CL 103 03/05/2014 0935   CO2 22 07/16/2016 0850   CO2 25 03/05/2014 0935   BUN 20  07/16/2016 0850   BUN 20 (H) 03/05/2014 0935   CREATININE 1.14 (H) 07/16/2016 0850   CREATININE 1.46 (H) 03/05/2014 0935      Component Value Date/Time   CALCIUM 8.9 07/16/2016 0850   CALCIUM 9.5 03/05/2014 0935   ALKPHOS 53 07/16/2016 0850   ALKPHOS 72 02/02/2014 1454   AST 29 07/16/2016 0850   AST 35 02/02/2014 1454   ALT 14 07/16/2016 0850   ALT 27 02/02/2014 1454   BILITOT 0.7 07/16/2016 0850   BILITOT 0.2 02/02/2014 1454       RADIOGRAPHIC STUDIES: I have personally reviewed the radiological images as listed and agreed with the findings in the report. Ct Chest Wo Contrast  Result Date: 07/16/2016 CLINICAL DATA:  71 year old female with history of colon cancer. Additional history of right renal cancer status post nephrectomy. EXAM: CT CHEST WITHOUT CONTRAST TECHNIQUE: Multidetector CT imaging of the chest was performed following the standard protocol without IV contrast. COMPARISON:  PET-CT 08/11/2015. FINDINGS: Cardiovascular: Heart size is normal. There is no significant pericardial fluid, thickening or pericardial calcification. There is aortic atherosclerosis, as well as atherosclerosis of the great vessels of the mediastinum and the coronary arteries, including calcified atherosclerotic plaque in the left anterior descending and right coronary arteries. Calcifications of the mitral annulus. Mediastinum/Nodes: No pathologically enlarged mediastinal or hilar lymph nodes. Please note that accurate exclusion of hilar adenopathy is limited on noncontrast CT scans. Multiple densely calcified right hilar lymph nodes. Esophagus is unremarkable in appearance. No axillary lymphadenopathy. Lungs/Pleura: 6 mm calcified granuloma in the right upper lobe is unchanged. Multiple other smaller noncalcified pulmonary nodules are noted in the lungs bilaterally, similar in size, number and distribution to prior studies, with the largest of these in the subpleural aspect of the right lower lobe (image 64  of series 3) measuring 6 mm (unchanged dating back to 06/24/2008). No other larger more suspicious appearing pulmonary nodules or masses are noted. No acute  consolidative airspace disease. No pleural effusions. Upper Abdomen: Status post cholecystectomy. Aortic atherosclerosis. Status post right nephrectomy. Small small ventral hernia containing a short segment of the mid transverse colon. Multiple borderline enlarged retroperitoneal lymph nodes. Musculoskeletal: There are no aggressive appearing lytic or blastic lesions noted in the visualized portions of the skeleton. IMPRESSION: 1. No definitive evidence to suggest metastatic disease to the thorax. 2. Multiple pulmonary nodules (including calcified and noncalcified pulmonary nodules) stable on prior examinations dating back to 2010, considered benign. 3. Aortic atherosclerosis, in addition to 2 vessel coronary artery disease. Assessment for potential risk factor modification, dietary therapy or pharmacologic therapy may be warranted, if clinically indicated. 4. Additional incidental findings, as above. Electronically Signed   By: Vinnie Langton M.D.   On: 07/16/2016 12:59     ASSESSMENT & PLAN:  Cancer of right kidney ([place]) # Metastatic stage IV kidney cancer- isolated metastases to the pancreas. CT scan chest in May 2018- stable lung nodules; not any worse/ unlikely metastatic disease.  [facility]- ? Good vs intermediate risk.   # In general renal cancer metastatic to pancreas - tend to behave in an indolent fashion. Again discussed multiple options- including-Whipple's/radiation [high risk of morbidity]- patient declines surgical evaluation- as she thinks she is not having surgery.  # Discussed surveillance versus combination immunotherapy. Patient's pancreatic lesion- was present in February 2017 PET scan; and possibly in February 2014 PET scan- as per my discussion with radiology.   # Also discussed regarding- first-line immunotherapy with IPI+NIVO;  response rates ~ 40%; median PFS- 11 months; median survival not reached. Also discussed with Dr.McNamara. Appreciate the recommendations from Dr. Irene Shipper.   # Patient inclined towards combination immunotherapy. However, daughter understands my concerns regarding potential side effects; and the rationale behind surveillance. States that she will talk to her mother again. And inform us regarding- the final plan. If she patient agrees to surveillance- she will need MRI of the abdomen with contrast in approximately 3 months.  # I do long discussion regarding the significant concerns for side effects of immunotherapy- including diarrhea; skin toxicity; and also liver abnormalities/endocrine abnormalities.  # History of chronic intermittent diarrhea- she does understand that this could get worse on immunotherapy.   # CKD stage III-  Stable.   # Iron deficiency anemia-  status post IV iron; on a monthly basis. Today hemoglobin is 10.5. Proceed with IV Feraheme today.  # Cirrhosis/ ? NASH- worked up with [place]. No decompensation noted.  # I reviewed the blood work- with the patient in detail; also reviewed the imaging independently [as summarized above]; and with the patient in detail.   No orders of the defined types were placed in this encounter.  All questions were answered. The patient knows to call the clinic with any problems, questions or concerns.      Cammie Sickle, MD 07/17/2016 5:04 PM

## 2016-07-17 NOTE — Progress Notes (Signed)
Here for follow up

## 2016-07-17 NOTE — Assessment & Plan Note (Addendum)
#   Metastatic stage IV kidney cancer- isolated metastases to the pancreas. CT scan chest in May 2018- stable lung nodules; not any worse/ unlikely metastatic disease.  [facility]- ? Good vs intermediate risk.   # In general renal cancer metastatic to pancreas - tend to behave in an indolent fashion. Again discussed multiple options- including-Whipple's/radiation [high risk of morbidity]- patient declines surgical evaluation- as she thinks she is not having surgery.  # Discussed surveillance versus combination immunotherapy. Patient's pancreatic lesion- was present in February 2017 PET scan; and possibly in February 2014 PET scan- as per my discussion with radiology.   # Also discussed regarding- first-line immunotherapy with IPI+NIVO; response rates ~ 40%; median PFS- 11 months; median survival not reached. Also discussed with Dr.McNamara. Appreciate the recommendations from Dr. Irene Shipper.   # Patient inclined towards combination immunotherapy. However, daughter understands my concerns regarding potential side effects; and the rationale behind surveillance. States that she will talk to her mother again. And inform us regarding- the final plan. If she patient agrees to surveillance- she will need MRI of the abdomen with contrast in approximately 3 months.  # I do long discussion regarding the significant concerns for side effects of immunotherapy- including diarrhea; skin toxicity; and also liver abnormalities/endocrine abnormalities.  # History of chronic intermittent diarrhea- she does understand that this could get worse on immunotherapy.   # CKD stage III-  Stable.   # Iron deficiency anemia-  status post IV iron; on a monthly basis. Today hemoglobin is 10.5. Proceed with IV Feraheme today.  # Cirrhosis/ ? NASH- worked up with [place]. No decompensation noted.  # I reviewed the blood work- with the patient in detail; also reviewed the imaging independently [as summarized above]; and with the  patient in detail.

## 2016-07-17 NOTE — Telephone Encounter (Signed)
See office visit note- from 5/22nd.

## 2016-07-17 NOTE — Telephone Encounter (Signed)
Patient's daughter will call us regarding the next plan- if she opts for surveillance- I will need to order an MRI of the abdomen. However if she wants to go with treatment- I need to order the treatments. Please watch out for the patient's/doctors call. Daughter knows that I will be going on vacation starting June 1st.

## 2016-07-17 NOTE — Patient Instructions (Signed)

## 2016-07-18 ENCOUNTER — Encounter: Payer: Self-pay | Admitting: Internal Medicine

## 2016-07-19 ENCOUNTER — Telehealth: Payer: Self-pay | Admitting: Internal Medicine

## 2016-07-19 NOTE — Telephone Encounter (Signed)
Left message for pt's daughter- to call us back to disucss the treatment option- I would prefer TKI over combination immunotherapy given risk vs benefit profile.

## 2016-07-20 ENCOUNTER — Telehealth: Payer: Self-pay | Admitting: Internal Medicine

## 2016-07-20 MED ORDER — CABOZANTINIB S-MALATE 60 MG PO TABS: 1.0000 | ORAL_TABLET | Freq: Every day | ORAL | 3 refills | Status: DC

## 2016-07-20 NOTE — Telephone Encounter (Signed)
Started prescription for cabometyx- pt will need to be seen by covering MD- appx 2-3 weeks after starting the pills- with cbc/cmp/ and can be scheduled to get IV ferrheam again at that time.

## 2016-07-20 NOTE — Telephone Encounter (Signed)
Spoke to daugher- Butch Penny- as patient is uncomfortable with surveillance for her recurrent kidney cancer- I think combination chemotherapy with IPI+NIVO-would be too toxic. Recommend cabozantanib- discussed the potential side effects including but not limited to diarrhea hypertension hand-foot syndrome fatigue. We'll start a prescription for cabometyx. Daughter understands the possible financial constraints of co-pay/etc. prior to availability of the drug. Dr.B

## 2016-07-25 ENCOUNTER — Encounter: Payer: Self-pay | Admitting: *Deleted

## 2016-07-26 ENCOUNTER — Encounter: Payer: Self-pay | Admitting: *Deleted

## 2016-07-26 NOTE — Telephone Encounter (Signed)
msg received from [employer]ics- currently in process for insurance approval. [employer]ics performing prior auth. Msg sent to daughter last evening to update the pt/pt's family in the drug ordering process.

## 2016-07-30 ENCOUNTER — Other Ambulatory Visit: Payer: Medicare Other

## 2016-07-31 ENCOUNTER — Telehealth: Payer: Self-pay | Admitting: *Deleted

## 2016-07-31 NOTE — Telephone Encounter (Signed)
RN contacted biologics. Insurance approved pt's Cabometryx. Per biologics. Pt was contacted yesterday. Pt declined drug shipment until final treatment decisions are made. Pt informed biologics that she is waiting for her apts with the surgeon to determine if she is a surgical candidate based on subsequent testing. Pt to notifiy biologics back in a few days to determine her decision on starting the new oral chemotherapy drug.   [employer]ics unable to tell me the copayment for the medication at this time-will be determined once pt has made a decision to proceed with medication per biologics.

## 2016-08-01 ENCOUNTER — Ambulatory Visit: Payer: Medicare Other

## 2016-08-01 ENCOUNTER — Ambulatory Visit: Payer: Medicare Other | Admitting: Internal Medicine

## 2016-08-03 ENCOUNTER — Encounter: Payer: Self-pay | Admitting: *Deleted

## 2016-09-03 ENCOUNTER — Other Ambulatory Visit: Payer: Self-pay

## 2016-09-03 ENCOUNTER — Inpatient Hospital Stay: Payer: Medicare Other | Attending: Internal Medicine

## 2016-09-03 DIAGNOSIS — D500 Iron deficiency anemia secondary to blood loss (chronic): Secondary | ICD-10-CM

## 2016-09-03 DIAGNOSIS — K7460 Unspecified cirrhosis of liver: Secondary | ICD-10-CM | POA: Diagnosis not present

## 2016-09-03 DIAGNOSIS — C7889 Secondary malignant neoplasm of other digestive organs: Secondary | ICD-10-CM | POA: Insufficient documentation

## 2016-09-03 DIAGNOSIS — Z79899 Other long term (current) drug therapy: Secondary | ICD-10-CM | POA: Diagnosis not present

## 2016-09-03 DIAGNOSIS — Z85850 Personal history of malignant neoplasm of thyroid: Secondary | ICD-10-CM | POA: Diagnosis not present

## 2016-09-03 DIAGNOSIS — N183 Chronic kidney disease, stage 3 (moderate): Secondary | ICD-10-CM | POA: Insufficient documentation

## 2016-09-03 DIAGNOSIS — Z803 Family history of malignant neoplasm of breast: Secondary | ICD-10-CM | POA: Diagnosis not present

## 2016-09-03 DIAGNOSIS — F419 Anxiety disorder, unspecified: Secondary | ICD-10-CM | POA: Insufficient documentation

## 2016-09-03 DIAGNOSIS — K219 Gastro-esophageal reflux disease without esophagitis: Secondary | ICD-10-CM | POA: Diagnosis not present

## 2016-09-03 DIAGNOSIS — E119 Type 2 diabetes mellitus without complications: Secondary | ICD-10-CM | POA: Insufficient documentation

## 2016-09-03 DIAGNOSIS — Z7984 Long term (current) use of oral hypoglycemic drugs: Secondary | ICD-10-CM | POA: Insufficient documentation

## 2016-09-03 DIAGNOSIS — I129 Hypertensive chronic kidney disease with stage 1 through stage 4 chronic kidney disease, or unspecified chronic kidney disease: Secondary | ICD-10-CM | POA: Insufficient documentation

## 2016-09-03 DIAGNOSIS — D509 Iron deficiency anemia, unspecified: Secondary | ICD-10-CM | POA: Insufficient documentation

## 2016-09-03 DIAGNOSIS — Z85038 Personal history of other malignant neoplasm of large intestine: Secondary | ICD-10-CM | POA: Insufficient documentation

## 2016-09-03 DIAGNOSIS — Z87891 Personal history of nicotine dependence: Secondary | ICD-10-CM | POA: Diagnosis not present

## 2016-09-03 DIAGNOSIS — C641 Malignant neoplasm of right kidney, except renal pelvis: Secondary | ICD-10-CM | POA: Insufficient documentation

## 2016-09-03 DIAGNOSIS — R5383 Other fatigue: Secondary | ICD-10-CM | POA: Diagnosis not present

## 2016-09-03 DIAGNOSIS — E039 Hypothyroidism, unspecified: Secondary | ICD-10-CM | POA: Insufficient documentation

## 2016-09-03 LAB — COMPREHENSIVE METABOLIC PANEL
ALBUMIN: 4 g/dL (ref 3.5–5.0)
ALK PHOS: 53 U/L (ref 38–126)
ALT: 15 U/L (ref 14–54)
AST: 27 U/L (ref 15–41)
Anion gap: 12 (ref 5–15)
BILIRUBIN TOTAL: 0.4 mg/dL (ref 0.3–1.2)
BUN: 22 mg/dL — AB (ref 6–20)
CALCIUM: 8.8 mg/dL — AB (ref 8.9–10.3)
CO2: 21 mmol/L — ABNORMAL LOW (ref 22–32)
CREATININE: 1.17 mg/dL — AB (ref 0.44–1.00)
Chloride: 105 mmol/L (ref 101–111)
GFR calc Af Amer: 52 mL/min — ABNORMAL LOW (ref >60)
GFR calc non Af Amer: 45 mL/min — ABNORMAL LOW (ref >60)
GLUCOSE: 190 mg/dL — AB (ref 65–99)
Potassium: 4.8 mmol/L (ref 3.5–5.1)
Sodium: 138 mmol/L (ref 135–145)
TOTAL PROTEIN: 7.2 g/dL (ref 6.5–8.1)

## 2016-09-03 LAB — IRON AND TIBC
Iron: 37 ug/dL (ref 28–170)
Saturation Ratios: 10 % — ABNORMAL LOW (ref 10.4–31.8)
TIBC: 357 ug/dL (ref 250–450)
UIBC: 320 ug/dL

## 2016-09-03 LAB — CBC WITH DIFFERENTIAL/PLATELET
BASOS ABS: 0.1 10*3/uL (ref 0–0.1)
BASOS PCT: 1 %
Eosinophils Absolute: 0.1 10*3/uL (ref 0–0.7)
Eosinophils Relative: 1 %
HEMATOCRIT: 33.4 % — AB (ref 35.0–47.0)
HEMOGLOBIN: 11.2 g/dL — AB (ref 12.0–16.0)
Lymphocytes Relative: 13 %
Lymphs Abs: 1.1 10*3/uL (ref 1.0–3.6)
MCH: 29 pg (ref 26.0–34.0)
MCHC: 33.6 g/dL (ref 32.0–36.0)
MCV: 86.4 fL (ref 80.0–100.0)
MONOS PCT: 4 %
Monocytes Absolute: 0.4 10*3/uL (ref 0.2–0.9)
NEUTROS ABS: 7 10*3/uL — AB (ref 1.4–6.5)
NEUTROS PCT: 81 %
Platelets: 205 10*3/uL (ref 150–440)
RBC: 3.87 MIL/uL (ref 3.80–5.20)
RDW: 15.2 % — ABNORMAL HIGH (ref 11.5–14.5)
WBC: 8.6 10*3/uL (ref 3.6–11.0)

## 2016-09-03 LAB — FERRITIN: Ferritin: 43 ng/mL (ref 11–307)

## 2016-09-05 ENCOUNTER — Inpatient Hospital Stay (HOSPITAL_BASED_OUTPATIENT_CLINIC_OR_DEPARTMENT_OTHER): Payer: Medicare Other | Admitting: Internal Medicine

## 2016-09-05 ENCOUNTER — Inpatient Hospital Stay: Payer: Medicare Other

## 2016-09-05 VITALS — BP 107/66 | HR 65 | Resp 18

## 2016-09-05 VITALS — BP 142/67 | HR 63 | Temp 97.8°F | Resp 20 | Ht 66.0 in | Wt 203.8 lb

## 2016-09-05 DIAGNOSIS — E039 Hypothyroidism, unspecified: Secondary | ICD-10-CM

## 2016-09-05 DIAGNOSIS — R5383 Other fatigue: Secondary | ICD-10-CM

## 2016-09-05 DIAGNOSIS — I129 Hypertensive chronic kidney disease with stage 1 through stage 4 chronic kidney disease, or unspecified chronic kidney disease: Secondary | ICD-10-CM | POA: Diagnosis not present

## 2016-09-05 DIAGNOSIS — N183 Chronic kidney disease, stage 3 (moderate): Secondary | ICD-10-CM

## 2016-09-05 DIAGNOSIS — K219 Gastro-esophageal reflux disease without esophagitis: Secondary | ICD-10-CM

## 2016-09-05 DIAGNOSIS — C7889 Secondary malignant neoplasm of other digestive organs: Secondary | ICD-10-CM

## 2016-09-05 DIAGNOSIS — D509 Iron deficiency anemia, unspecified: Secondary | ICD-10-CM

## 2016-09-05 DIAGNOSIS — E119 Type 2 diabetes mellitus without complications: Secondary | ICD-10-CM

## 2016-09-05 DIAGNOSIS — Z7984 Long term (current) use of oral hypoglycemic drugs: Secondary | ICD-10-CM

## 2016-09-05 DIAGNOSIS — Z85850 Personal history of malignant neoplasm of thyroid: Secondary | ICD-10-CM

## 2016-09-05 DIAGNOSIS — F419 Anxiety disorder, unspecified: Secondary | ICD-10-CM

## 2016-09-05 DIAGNOSIS — Z803 Family history of malignant neoplasm of breast: Secondary | ICD-10-CM

## 2016-09-05 DIAGNOSIS — K7460 Unspecified cirrhosis of liver: Secondary | ICD-10-CM

## 2016-09-05 DIAGNOSIS — C641 Malignant neoplasm of right kidney, except renal pelvis: Secondary | ICD-10-CM

## 2016-09-05 DIAGNOSIS — Z85038 Personal history of other malignant neoplasm of large intestine: Secondary | ICD-10-CM

## 2016-09-05 DIAGNOSIS — D638 Anemia in other chronic diseases classified elsewhere: Secondary | ICD-10-CM

## 2016-09-05 DIAGNOSIS — Z87891 Personal history of nicotine dependence: Secondary | ICD-10-CM

## 2016-09-05 DIAGNOSIS — D500 Iron deficiency anemia secondary to blood loss (chronic): Secondary | ICD-10-CM

## 2016-09-05 DIAGNOSIS — Z79899 Other long term (current) drug therapy: Secondary | ICD-10-CM

## 2016-09-05 MED ORDER — SODIUM CHLORIDE 0.9 % IV SOLN
Freq: Once | INTRAVENOUS | Status: AC
  Administered 2016-09-05: 12:00:00 via INTRAVENOUS
  Filled 2016-09-05: qty 1000

## 2016-09-05 MED ORDER — SODIUM CHLORIDE 0.9 % IV SOLN
510.0000 mg | Freq: Once | INTRAVENOUS | Status: AC
  Administered 2016-09-05: 510 mg via INTRAVENOUS
  Filled 2016-09-05: qty 17

## 2016-09-05 NOTE — Progress Notes (Signed)
[place] OFFICE PROGRESS NOTE  Patient Care Team: Idelle Crouch, MD as PCP - General (Internal Medicine) Clent Jacks, RN as Registered Nurse   Oncology History   [phone]- [place] [s/p Right Nephrectomy; Dr.Stoiff]; Clear cell; pT3pNx  # DEC 2015- s/p Thyroidectomy [Dr.Marterre ] Incidetal- 0.73m focus of RCC in R thyroid lobe; Multifocal benign follicular adenomatous nodules  # MAY 2018- RCC met to PANCREAS [incidental-US/MRI; noted on prior PET oct 2017- non-avid] s/p EUS Bx [Dr.Jowel]  # Iron deficiency anemia requiring blood transfusion and intravenous iron therapy (May of 2017)  # Cirrhosis of liver [PET June 2017]- AUG 31st 2017- MRI liver-cirrhosis/no lesions [Dr.Elliot] # 2008- SIGMOID COLON CA pT2/STAGE- I [IDA]  # # 2017- Lipoma resection [Dr.Mcqueen]     [place]cer of right kidney ([place]    INTERVAL HISTORY:    CKELIAH HARNED89y.o.  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   [facility] After multiple discussions back and forth- patient finally agreed to proceed with surgery which is planned August 2018  Patient complains of continued fatigue; however denies any blood in stools or black stools. She is intolerant to by mouth iron. No new shortness of breath or chest pain. Her energy levels have improved post IV iron. Patient continues to have diminished and diarrhea. Which is chronic  REVIEW OF SYSTEMS:  A complete 10 point review of system is done which is negative except mentioned above/history of present illness.   PAST MEDICAL HISTORY :  Past Medical History:  Diagnosis Date  . Anemia   . Anxiety   . Colon cancer ([place]   . Diabetes mellitus without complication ([facility]   . Family history of adverse reaction to anesthesia   . GERD (gastroesophageal reflux disease)   . Hypertension   . Hypothyroidism   .  Kidney cancer, primary, with metastasis from kidney to other site ([facility] 08/26/2014  . Thyroid cancer ([place]     PAST SURGICAL HISTORY :   Past Surgical History:  Procedure Laterality Date  . CHOLECYSTECTOMY    . COLON RESECTION    . COLONOSCOPY WITH PROPOFOL N/A 09/01/2015   Procedure: COLONOSCOPY WITH PROPOFOL;  Surgeon: RManya Silvas MD;  [facility]  Service: Endoscopy;  Laterality: N/A;  . ESOPHAGOGASTRODUODENOSCOPY (EGD) WITH PROPOFOL N/A 09/01/2015   Procedure: ESOPHAGOGASTRODUODENOSCOPY (EGD) WITH PROPOFOL;  Surgeon: RManya Silvas MD;  [facility]  Service: Endoscopy;  Laterality: N/A;  . EUS N/A 06/28/2016   Procedure: FULL UPPER ENDOSCOPIC ULTRASOUND (EUS) RADIAL;  Surgeon: PJola Schmidt MD;  Location: [facility] ENDOSCOPY;  Service: Endoscopy;  Laterality: N/A;  . EXCISION MASS NECK N/A 09/20/2015   Procedure: EXCISION MASS NECK;  Surgeon: CBeverly Gust MD;  [place]: [place]  Service: ENT;  Laterality: N/A;  . rt kidney removed Right   . THYROGLOSSAL DUCT CYST Right 09/20/2015   Procedure: excision sub mandibular gland right;  Surgeon: CBeverly Gust MD;  Location: [place]  Service: ENT;  Laterality: Right;  . THYROID SURGERY Bilateral   . TOTAL KNEE ARTHROPLASTY Right   . TOTAL KNEE ARTHROPLASTY Left 08/09/2014   Procedure: TOTAL KNEE ARTHROPLASTY;  Surgeon: JDereck Leep MD;  Location: [facility] ORS;  Service: Orthopedics;  Laterality: Left;    FAMILY HISTORY :   Family History  Problem Relation Age of Onset  . Breast cancer Maternal Aunt   .  Breast cancer Cousin     SOCIAL HISTORY:   Social History  Substance Use Topics  . Smoking status: Former Smoker    Quit date: 07/29/1994  . Smokeless tobacco: Never Used  . Alcohol use No    ALLERGIES:  has No Known Allergies.  MEDICATIONS:  Current Outpatient Prescriptions  Medication Sig Dispense Refill  . amLODipine-olmesartan (AZOR) 5-20 MG per tablet Take 1 tablet by  mouth daily.    . calcium acetate (PHOSLO) 667 MG capsule Take 667 mg by mouth 2 (two) times daily.     . Cholecalciferol (VITAMIN D-3) 1000 UNITS CAPS Take 1 capsule by mouth daily.    . ciprofloxacin (CIPRO) 250 MG tablet Take 1 tablet by mouth 2 (two) times daily.    . citalopram (CELEXA) 40 MG tablet Take 1 tablet by mouth  daily 90 tablet 3  . glipiZIDE (GLUCOTROL XL) 10 MG 24 hr tablet Take 10 mg by mouth daily with breakfast.     . HUMALOG KWIKPEN 100 UNIT/ML KiwkPen Inject into the skin. Based on sliding scale    . LEVEMIR FLEXTOUCH 100 UNIT/ML Pen Inject 15 Units into the skin at bedtime.    . metFORMIN (GLUCOPHAGE) 1000 MG tablet Take 1,000 mg by mouth 2 (two) times daily with a meal.    . nebivolol (BYSTOLIC) 5 MG tablet Take 5 mg by mouth daily.    . pantoprazole (PROTONIX) 40 MG tablet Take 40 mg by mouth daily.    . pioglitazone (ACTOS) 30 MG tablet Take 30 mg by mouth daily.    . vitamin B-12 (CYANOCOBALAMIN) 1000 MCG tablet Take 1,000 mcg by mouth daily. Reported on 07/07/2015    . ALPRAZolam (XANAX) 0.5 MG tablet Take 0.5 mg by mouth 3 (three) times daily as needed for anxiety.     . Fe Fum-FePoly-FA-Vit C-Vit B3 (FOLIVANE-F) 125-1 MG CAPS Take 1 capsule by mouth 2 (two) times daily.    . fexofenadine (ALLEGRA) 180 MG tablet Take 1 tablet by mouth daily.    Marland Kitchen HYDROcodone-acetaminophen (NORCO) 10-325 MG tablet Take 1 tablet by mouth every 6 (six) hours as needed for moderate pain.     Marland Kitchen levothyroxine (SYNTHROID, LEVOTHROID) 175 MCG tablet Take 1 tablet by mouth daily.     No current facility-administered medications for this visit.     PHYSICAL EXAMINATION: ECOG PERFORMANCE STATUS: 0 - Asymptomatic  BP (!) 142/67 (BP Location: Left Arm, Patient Position: Sitting)   Pulse 63   Temp 97.8 F (36.6 C) (Tympanic)   Resp 20   Ht 5' 6"  (1.676 m)   Wt 203 lb 12.8 oz (92.4 kg)   BMI 32.89 kg/m   Filed Weights   09/05/16 1023  Weight: 203 lb 12.8 oz (92.4 kg)    GENERAL:  Well-nourished well-developed; Alert, no distress and comfortable.  Alone.  EYES: no pallor or icterus OROPHARYNX: no thrush or ulceration; good dentition  NECK: supple, obvious neck mass noted in midline [lipoma] LYMPH:  no palpable lymphadenopathy in the cervical, axillary or inguinal regions LUNGS: clear to auscultation and  No wheeze or crackles HEART/CVS: regular rate & rhythm and no murmurs; Bil +2 lower extremity edema ABDOMEN:abdomen soft, non-tender and normal bowel sounds Musculoskeletal:no cyanosis of digits and no clubbing  PSYCH: alert & oriented x 3 with fluent speech NEURO: no focal motor/sensory deficits SKIN:  no rashes or significant lesions  LABORATORY DATA:  I have reviewed the data as listed    Component Value Date/Time   NA  138 09/03/2016 0955   NA 138 03/05/2014 0935   K 4.8 09/03/2016 0955   K 4.6 03/05/2014 0935   CL 105 09/03/2016 0955   CL 103 03/05/2014 0935   CO2 21 (L) 09/03/2016 0955   CO2 25 03/05/2014 0935   GLUCOSE 190 (H) 09/03/2016 0955   GLUCOSE 200 (H) 03/05/2014 0935   BUN 22 (H) 09/03/2016 0955   BUN 20 (H) 03/05/2014 0935   CREATININE 1.17 (H) 09/03/2016 0955   CREATININE 1.46 (H) 03/05/2014 0935   CALCIUM 8.8 (L) 09/03/2016 0955   CALCIUM 9.5 03/05/2014 0935   PROT 7.2 09/03/2016 0955   PROT 7.3 02/02/2014 1454   ALBUMIN 4.0 09/03/2016 0955   ALBUMIN 3.1 (L) 02/10/2014 0410   AST 27 09/03/2016 0955   AST 35 02/02/2014 1454   ALT 15 09/03/2016 0955   ALT 27 02/02/2014 1454   ALKPHOS 53 09/03/2016 0955   ALKPHOS 72 02/02/2014 1454   BILITOT 0.4 09/03/2016 0955   BILITOT 0.2 02/02/2014 1454   GFRNONAA 45 (L) 09/03/2016 0955   GFRNONAA 38 (L) 03/05/2014 0935   GFRNONAA 50 (L) 10/02/2013 1027   GFRAA 52 (L) 09/03/2016 0955   GFRAA 46 (L) 03/05/2014 0935   GFRAA 58 (L) 10/02/2013 1027    No results found for: SPEP, UPEP  Lab Results  Component Value Date   WBC 8.6 09/03/2016   NEUTROABS 7.0 (H) 09/03/2016   HGB 11.2 (L)  09/03/2016   HCT 33.4 (L) 09/03/2016   MCV 86.4 09/03/2016   PLT 205 09/03/2016      Chemistry      Component Value Date/Time   NA 138 09/03/2016 0955   NA 138 03/05/2014 0935   K 4.8 09/03/2016 0955   K 4.6 03/05/2014 0935   CL 105 09/03/2016 0955   CL 103 03/05/2014 0935   CO2 21 (L) 09/03/2016 0955   CO2 25 03/05/2014 0935   BUN 22 (H) 09/03/2016 0955   BUN 20 (H) 03/05/2014 0935   CREATININE 1.17 (H) 09/03/2016 0955   CREATININE 1.46 (H) 03/05/2014 0935      Component Value Date/Time   CALCIUM 8.8 (L) 09/03/2016 0955   CALCIUM 9.5 03/05/2014 0935   ALKPHOS 53 09/03/2016 0955   ALKPHOS 72 02/02/2014 1454   AST 27 09/03/2016 0955   AST 35 02/02/2014 1454   ALT 15 09/03/2016 0955   ALT 27 02/02/2014 1454   BILITOT 0.4 09/03/2016 0955   BILITOT 0.2 02/02/2014 1454       RADIOGRAPHIC STUDIES: I have personally reviewed the radiological images as listed and agreed with the findings in the report. No results found.   ASSESSMENT & PLAN:  Cancer of right kidney ([place]) # Metastatic stage IV kidney cancer- isolated metastases to the pancreas. CT scan chest in May 2018- stable lung nodules; not any worse/ unlikely metastatic disease.  [facility]- ? Good vs intermediate risk.   # After discussing multiple treatment options- patient finally agreed to proceed with surgery- for a pancreatic lesion at [facility].   # Plan to hold off any systemic therapy at this time.  # CKD stage III-  Stable.   # Iron deficiency anemia-  status post IV iron; on a monthly basis. Today hemoglobin is 10.5. Proceed with IV Feraheme today; again next week  # Cirrhosis/ ? NASH- worked up with [place]. No decompensation noted.  # follow up 3-4 weeks post surgery/labs Ferrahem.    Orders Placed This Encounter  Procedures  . CBC with  Differential    Standing Status:   Future    Standing Expiration Date:   09/05/2017  . Basic metabolic panel    Standing Status:   Future    Standing Expiration Date:    09/05/2017   All questions were answered. The patient knows to call the clinic with any problems, questions or concerns.      Cammie Sickle, MD 09/05/2016 6:12 PM

## 2016-09-05 NOTE — Assessment & Plan Note (Addendum)
#   Metastatic stage IV kidney cancer- isolated metastases to the pancreas. CT scan chest in May 2018- stable lung nodules; not any worse/ unlikely metastatic disease.  [facility]- ? Good vs intermediate risk.   # After discussing multiple treatment options- patient finally agreed to proceed with surgery- for a pancreatic lesion at [facility].   # Plan to hold off any systemic therapy at this time.  # CKD stage III-  Stable.   # Iron deficiency anemia-  status post IV iron; on a monthly basis. Today hemoglobin is 10.5. Proceed with IV Feraheme today; again next week  # Cirrhosis/ ? NASH- worked up with [place]. No decompensation noted.  # follow up 3-4 weeks post surgery/labs Ferrahem.

## 2016-09-12 ENCOUNTER — Inpatient Hospital Stay: Payer: Medicare Other

## 2016-09-12 DIAGNOSIS — C641 Malignant neoplasm of right kidney, except renal pelvis: Secondary | ICD-10-CM | POA: Diagnosis not present

## 2016-09-12 DIAGNOSIS — D638 Anemia in other chronic diseases classified elsewhere: Secondary | ICD-10-CM

## 2016-09-12 DIAGNOSIS — D500 Iron deficiency anemia secondary to blood loss (chronic): Secondary | ICD-10-CM

## 2016-09-12 MED ORDER — SODIUM CHLORIDE 0.9 % IV SOLN
Freq: Once | INTRAVENOUS | Status: AC
Start: 2016-09-12 — End: 2016-09-12
  Administered 2016-09-12: 14:00:00 via INTRAVENOUS
  Filled 2016-09-12: qty 1000

## 2016-09-12 MED ORDER — SODIUM CHLORIDE 0.9 % IV SOLN
510.0000 mg | Freq: Once | INTRAVENOUS | Status: AC
  Administered 2016-09-12: 510 mg via INTRAVENOUS
  Filled 2016-09-12: qty 17

## 2016-10-18 ENCOUNTER — Other Ambulatory Visit: Payer: Self-pay | Admitting: Internal Medicine

## 2016-10-18 ENCOUNTER — Ambulatory Visit
Admission: RE | Admit: 2016-10-18 | Discharge: 2016-10-18 | Disposition: A | Discharge status: Home / Self Care | Payer: Medicare Other | Source: Ambulatory Visit | Attending: Internal Medicine | Admitting: Internal Medicine

## 2016-10-18 DIAGNOSIS — I700 Atherosclerosis of aorta: Secondary | ICD-10-CM | POA: Diagnosis not present

## 2016-10-18 DIAGNOSIS — R0602 Shortness of breath: Secondary | ICD-10-CM | POA: Insufficient documentation

## 2016-10-18 MED ORDER — IOPAMIDOL (ISOVUE-370) INJECTION 76%
75.0000 mL | Freq: Once | INTRAVENOUS | Status: AC | PRN
  Administered 2016-10-18: 75 mL via INTRAVENOUS

## 2016-11-02 ENCOUNTER — Inpatient Hospital Stay: Payer: Medicare Other

## 2016-11-02 ENCOUNTER — Inpatient Hospital Stay (HOSPITAL_BASED_OUTPATIENT_CLINIC_OR_DEPARTMENT_OTHER): Payer: Medicare Other | Admitting: Internal Medicine

## 2016-11-02 ENCOUNTER — Inpatient Hospital Stay: Payer: Medicare Other | Attending: Internal Medicine

## 2016-11-02 VITALS — BP 130/68 | HR 64 | Temp 97.1°F | Resp 16 | Wt 196.6 lb

## 2016-11-02 DIAGNOSIS — K219 Gastro-esophageal reflux disease without esophagitis: Secondary | ICD-10-CM | POA: Insufficient documentation

## 2016-11-02 DIAGNOSIS — K7460 Unspecified cirrhosis of liver: Secondary | ICD-10-CM

## 2016-11-02 DIAGNOSIS — Z85850 Personal history of malignant neoplasm of thyroid: Secondary | ICD-10-CM | POA: Insufficient documentation

## 2016-11-02 DIAGNOSIS — Z87891 Personal history of nicotine dependence: Secondary | ICD-10-CM | POA: Diagnosis not present

## 2016-11-02 DIAGNOSIS — Z79899 Other long term (current) drug therapy: Secondary | ICD-10-CM

## 2016-11-02 DIAGNOSIS — N939 Abnormal uterine and vaginal bleeding, unspecified: Secondary | ICD-10-CM | POA: Diagnosis not present

## 2016-11-02 DIAGNOSIS — Z85038 Personal history of other malignant neoplasm of large intestine: Secondary | ICD-10-CM

## 2016-11-02 DIAGNOSIS — N183 Chronic kidney disease, stage 3 (moderate): Secondary | ICD-10-CM | POA: Diagnosis not present

## 2016-11-02 DIAGNOSIS — I129 Hypertensive chronic kidney disease with stage 1 through stage 4 chronic kidney disease, or unspecified chronic kidney disease: Secondary | ICD-10-CM | POA: Insufficient documentation

## 2016-11-02 DIAGNOSIS — Z803 Family history of malignant neoplasm of breast: Secondary | ICD-10-CM | POA: Diagnosis not present

## 2016-11-02 DIAGNOSIS — D735 Infarction of spleen: Secondary | ICD-10-CM | POA: Diagnosis not present

## 2016-11-02 DIAGNOSIS — Z794 Long term (current) use of insulin: Secondary | ICD-10-CM | POA: Insufficient documentation

## 2016-11-02 DIAGNOSIS — F419 Anxiety disorder, unspecified: Secondary | ICD-10-CM

## 2016-11-02 DIAGNOSIS — E119 Type 2 diabetes mellitus without complications: Secondary | ICD-10-CM

## 2016-11-02 DIAGNOSIS — D509 Iron deficiency anemia, unspecified: Secondary | ICD-10-CM | POA: Diagnosis not present

## 2016-11-02 DIAGNOSIS — C641 Malignant neoplasm of right kidney, except renal pelvis: Secondary | ICD-10-CM | POA: Insufficient documentation

## 2016-11-02 DIAGNOSIS — E039 Hypothyroidism, unspecified: Secondary | ICD-10-CM

## 2016-11-02 DIAGNOSIS — D500 Iron deficiency anemia secondary to blood loss (chronic): Secondary | ICD-10-CM

## 2016-11-02 DIAGNOSIS — C7889 Secondary malignant neoplasm of other digestive organs: Secondary | ICD-10-CM | POA: Diagnosis not present

## 2016-11-02 DIAGNOSIS — D638 Anemia in other chronic diseases classified elsewhere: Secondary | ICD-10-CM

## 2016-11-02 LAB — CBC WITH DIFFERENTIAL/PLATELET
BASOS ABS: 0.1 10*3/uL (ref 0–0.1)
BASOS PCT: 1 %
EOS ABS: 0.3 10*3/uL (ref 0–0.7)
EOS PCT: 2 %
HCT: 34.8 % — ABNORMAL LOW (ref 35.0–47.0)
Hemoglobin: 11.4 g/dL — ABNORMAL LOW (ref 12.0–16.0)
Lymphocytes Relative: 12 %
Lymphs Abs: 1.3 10*3/uL (ref 1.0–3.6)
MCH: 29.3 pg (ref 26.0–34.0)
MCHC: 32.9 g/dL (ref 32.0–36.0)
MCV: 88.9 fL (ref 80.0–100.0)
MONO ABS: 0.7 10*3/uL (ref 0.2–0.9)
Monocytes Relative: 6 %
Neutro Abs: 8.7 10*3/uL — ABNORMAL HIGH (ref 1.4–6.5)
Neutrophils Relative %: 79 %
PLATELETS: 293 10*3/uL (ref 150–440)
RBC: 3.91 MIL/uL (ref 3.80–5.20)
RDW: 15.9 % — AB (ref 11.5–14.5)
WBC: 11.1 10*3/uL — AB (ref 3.6–11.0)

## 2016-11-02 LAB — BASIC METABOLIC PANEL
ANION GAP: 11 (ref 5–15)
BUN: 15 mg/dL (ref 6–20)
CALCIUM: 8.6 mg/dL — AB (ref 8.9–10.3)
CO2: 24 mmol/L (ref 22–32)
Chloride: 100 mmol/L — ABNORMAL LOW (ref 101–111)
Creatinine, Ser: 1.19 mg/dL — ABNORMAL HIGH (ref 0.44–1.00)
GFR calc Af Amer: 51 mL/min — ABNORMAL LOW (ref >60)
GFR, EST NON AFRICAN AMERICAN: 44 mL/min — AB (ref >60)
GLUCOSE: 360 mg/dL — AB (ref 65–99)
POTASSIUM: 4.3 mmol/L (ref 3.5–5.1)
SODIUM: 135 mmol/L (ref 135–145)

## 2016-11-02 MED ORDER — SODIUM CHLORIDE 0.9 % IV SOLN
510.0000 mg | Freq: Once | INTRAVENOUS | Status: AC
  Administered 2016-11-02: 510 mg via INTRAVENOUS
  Filled 2016-11-02: qty 17

## 2016-11-02 MED ORDER — SODIUM CHLORIDE 0.9 % IV SOLN
Freq: Once | INTRAVENOUS | Status: AC
  Administered 2016-11-02: 13:00:00 via INTRAVENOUS
  Filled 2016-11-02: qty 1000

## 2016-11-02 NOTE — Progress Notes (Signed)
[employer] OFFICE PROGRESS NOTE  Patient Care Team: Idelle Crouch, MD as PCP - General (Internal Medicine) Clent Jacks, RN as Registered Nurse   Oncology History   [phone]- [place] [s/p Right Nephrectomy; Dr.Stoiff]; Clear cell; pT3pNx  # DEC 2015- s/p Thyroidectomy [Dr.Marterre ] Incidetal- 0.72m focus of RCC in R thyroid lobe; Multifocal benign follicular adenomatous nodules  # MAY 2018- RCC met to PANCREAS [incidental-US/MRI; noted on prior PET oct 2017- non-avid] s/p EUS Bx [Dr.Jowel];  #  10/01/2016-Distal pancreatectomy [clear margins;~3.5cm ; 0/1LN ]  open, spleen preserving, Dr/ KLennette BihariShah/Dr.McNamara  # Iron deficiency anemia requiring blood transfusion and intravenous iron therapy (May of 2017)  # Cirrhosis of liver [PET June 2017]- AUG 31st 2017- MRI liver-cirrhosis/no lesions [Dr.Elliot] # 2008- 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 She had been on Lovenox approximately up about 2 weeks ago. She is currently off any anticoagulation. She had a recent CT of the chest- that showed no PE; but showed a proximal splenic artery occlusion/splenic infarct. Not on anticoagulation-thought to be from her recent surgery.  Patient yesterday had episode of vaginal bleeding 1 time. Currently resolved. Denies abdominal pain. She is mild tenderness at the site of the incision.    Patient complains of continued fatigue. No new shortness of breath or chest pain. Her energy levels have improved post IV iron.   REVIEW OF SYSTEMS:  A complete 10 point review of system is done which is  negative except mentioned above/history of present illness.   PAST MEDICAL HISTORY :  Past Medical History:  Diagnosis Date  . Anemia   . Anxiety   . Colon cancer ([place]   . Diabetes mellitus without complication ([facility]   . Family history of adverse reaction to anesthesia   . GERD (gastroesophageal reflux disease)   . Hypertension   . Hypothyroidism   . Kidney cancer, primary, with metastasis from kidney to other site ([facility] 08/26/2014  . Thyroid cancer ([facility]     PAST SURGICAL HISTORY :   Past Surgical History:  Procedure Laterality Date  . CHOLECYSTECTOMY    . COLON RESECTION    . COLONOSCOPY WITH PROPOFOL N/A 09/01/2015   Procedure: COLONOSCOPY WITH PROPOFOL;  Surgeon: RManya Silvas MD;  [facility]  Service: Endoscopy;  Laterality: N/A;  . ESOPHAGOGASTRODUODENOSCOPY (EGD) WITH PROPOFOL N/A 09/01/2015   Procedure: ESOPHAGOGASTRODUODENOSCOPY (EGD) WITH PROPOFOL;  Surgeon: RManya Silvas MD;  [facility]  Service: Endoscopy;  Laterality: N/A;  . EUS N/A 06/28/2016   Procedure: FULL UPPER ENDOSCOPIC ULTRASOUND (EUS) RADIAL;  Surgeon: PJola Schmidt MD;  Location: [facility] ENDOSCOPY;  Service: Endoscopy;  Laterality: N/A;  . EXCISION MASS NECK N/A 09/20/2015   Procedure: EXCISION MASS NECK;  Surgeon: CBeverly Gust MD;  Location: [place]  Service: ENT;  Laterality: N/A;  . rt kidney removed Right   . THYROGLOSSAL DUCT CYST Right 09/20/2015   Procedure: excision sub mandibular gland right;  Surgeon: CBeverly Gust MD;  Location: [place]  Service: ENT;  Laterality: Right;  . THYROID SURGERY Bilateral   .  TOTAL KNEE ARTHROPLASTY Right   . TOTAL KNEE ARTHROPLASTY Left 08/09/2014   Procedure: TOTAL KNEE ARTHROPLASTY;  Surgeon: Dereck Leep, MD;  Location: [facility] ORS;  Service: Orthopedics;  Laterality: Left;    FAMILY HISTORY :   Family History  Problem Relation Age of Onset  . Breast cancer Maternal Aunt   . Breast cancer Cousin      SOCIAL HISTORY:   Social History  Substance Use Topics  . Smoking status: Former Smoker    Quit date: 07/29/1994  . Smokeless tobacco: Never Used  . Alcohol use No    ALLERGIES:  has No Known Allergies.  MEDICATIONS:  Current Outpatient Prescriptions  Medication Sig Dispense Refill  . ALPRAZolam (XANAX) 0.5 MG tablet Take 0.5 mg by mouth 3 (three) times daily as needed for anxiety.     Marland Kitchen amLODipine-olmesartan (AZOR) 5-20 MG per tablet Take 1 tablet by mouth daily.    . calcium acetate (PHOSLO) 667 MG capsule Take 667 mg by mouth 2 (two) times daily.     . Cholecalciferol (VITAMIN D-3) 1000 UNITS CAPS Take 1 capsule by mouth daily.    . citalopram (CELEXA) 40 MG tablet Take 1 tablet by mouth  daily 90 tablet 3  . Fe Fum-FePoly-FA-Vit C-Vit B3 (FOLIVANE-F) 125-1 MG CAPS Take 1 capsule by mouth 2 (two) times daily.    . fexofenadine (ALLEGRA) 180 MG tablet Take 1 tablet by mouth daily.    Marland Kitchen HUMALOG KWIKPEN 100 UNIT/ML KiwkPen Inject into the skin. Based on sliding scale    . LEVEMIR FLEXTOUCH 100 UNIT/ML Pen Inject 15 Units into the skin at bedtime.    Marland Kitchen levothyroxine (SYNTHROID, LEVOTHROID) 175 MCG tablet Take 1 tablet by mouth daily.    . nebivolol (BYSTOLIC) 5 MG tablet Take 5 mg by mouth daily.    . pantoprazole (PROTONIX) 40 MG tablet Take 40 mg by mouth daily.    . vitamin B-12 (CYANOCOBALAMIN) 1000 MCG tablet Take 1,000 mcg by mouth daily. Reported on 07/07/2015    . glipiZIDE (GLUCOTROL XL) 10 MG 24 hr tablet Take 10 mg by mouth daily with breakfast.     . HYDROcodone-acetaminophen (NORCO) 10-325 MG tablet Take 1 tablet by mouth every 6 (six) hours as needed for moderate pain.     . metFORMIN (GLUCOPHAGE) 1000 MG tablet Take 1,000 mg by mouth 2 (two) times daily with a meal.    . pioglitazone (ACTOS) 30 MG tablet Take 30 mg by mouth daily.     No current facility-administered medications for this visit.     PHYSICAL EXAMINATION: ECOG PERFORMANCE STATUS: 0 -  Asymptomatic  BP 130/68 (BP Location: Left Arm, Patient Position: Sitting)   Pulse 64   Temp (!) 97.1 F (36.2 C) (Tympanic)   Resp 16   Wt 196 lb 9.6 oz (89.2 kg)   BMI 31.73 kg/m   Filed Weights   11/02/16 1112  Weight: 196 lb 9.6 oz (89.2 kg)    GENERAL: Well-nourished well-developed; Alert, no distress and comfortable.  Alone.  EYES: no pallor or icterus OROPHARYNX: no thrush or ulceration; good dentition  NECK: supple, obvious neck mass noted in midline [lipoma] LYMPH:  no palpable lymphadenopathy in the cervical, axillary or inguinal regions LUNGS: clear to auscultation and  No wheeze or crackles HEART/CVS: regular rate & rhythm and no murmurs; Bil +2 lower extremity edema ABDOMEN:abdomen soft, non-tender and normal bowel sounds Musculoskeletal:no cyanosis of digits and no clubbing  PSYCH: alert & oriented x 3 with  fluent speech NEURO: no focal motor/sensory deficits SKIN:  no rashes or significant lesions  LABORATORY DATA:  I have reviewed the data as listed    Component Value Date/Time   NA 135 11/02/2016 1037   NA 138 03/05/2014 0935   K 4.3 11/02/2016 1037   K 4.6 03/05/2014 0935   CL 100 (L) 11/02/2016 1037   CL 103 03/05/2014 0935   CO2 24 11/02/2016 1037   CO2 25 03/05/2014 0935   GLUCOSE 360 (H) 11/02/2016 1037   GLUCOSE 200 (H) 03/05/2014 0935   BUN 15 11/02/2016 1037   BUN 20 (H) 03/05/2014 0935   CREATININE 1.19 (H) 11/02/2016 1037   CREATININE 1.46 (H) 03/05/2014 0935   CALCIUM 8.6 (L) 11/02/2016 1037   CALCIUM 9.5 03/05/2014 0935   PROT 7.2 09/03/2016 0955   PROT 7.3 02/02/2014 1454   ALBUMIN 4.0 09/03/2016 0955   ALBUMIN 3.1 (L) 02/10/2014 0410   AST 27 09/03/2016 0955   AST 35 02/02/2014 1454   ALT 15 09/03/2016 0955   ALT 27 02/02/2014 1454   ALKPHOS 53 09/03/2016 0955   ALKPHOS 72 02/02/2014 1454   BILITOT 0.4 09/03/2016 0955   BILITOT 0.2 02/02/2014 1454   GFRNONAA 44 (L) 11/02/2016 1037   GFRNONAA 38 (L) 03/05/2014 0935    GFRNONAA 50 (L) 10/02/2013 1027   GFRAA 51 (L) 11/02/2016 1037   GFRAA 46 (L) 03/05/2014 0935   GFRAA 58 (L) 10/02/2013 1027    No results found for: SPEP, UPEP  Lab Results  Component Value Date   WBC 11.1 (H) 11/02/2016   NEUTROABS 8.7 (H) 11/02/2016   HGB 11.4 (L) 11/02/2016   HCT 34.8 (L) 11/02/2016   MCV 88.9 11/02/2016   PLT 293 11/02/2016      Chemistry      Component Value Date/Time   NA 135 11/02/2016 1037   NA 138 03/05/2014 0935   K 4.3 11/02/2016 1037   K 4.6 03/05/2014 0935   CL 100 (L) 11/02/2016 1037   CL 103 03/05/2014 0935   CO2 24 11/02/2016 1037   CO2 25 03/05/2014 0935   BUN 15 11/02/2016 1037   BUN 20 (H) 03/05/2014 0935   CREATININE 1.19 (H) 11/02/2016 1037   CREATININE 1.46 (H) 03/05/2014 0935      Component Value Date/Time   CALCIUM 8.6 (L) 11/02/2016 1037   CALCIUM 9.5 03/05/2014 0935   ALKPHOS 53 09/03/2016 0955   ALKPHOS 72 02/02/2014 1454   AST 27 09/03/2016 0955   AST 35 02/02/2014 1454   ALT 15 09/03/2016 0955   ALT 27 02/02/2014 1454   BILITOT 0.4 09/03/2016 0955   BILITOT 0.2 02/02/2014 1454       RADIOGRAPHIC STUDIES: I have personally reviewed the radiological images as listed and agreed with the findings in the report. No results found.   ASSESSMENT & PLAN:  Cancer of right kidney ([place]) # Metastatic stage IV kidney cancer- isolated metastases to the pancreas; status post resection of the tumor. Negative margins/one negative lymph node. My recommendation would be is to hold off any systemic therapy at this time. Would plan reimaging in approximately 3 months or so. Would recommend initiating treatment- if recurrence of disease noted on the imaging. Patient also awaiting evaluation with medical oncology at [facility] for surgery.  # Splenic artery occlusion/splenic infarct asymptomatic- likely incidental/secondary to surgery; not on anticoagulation- as per [facility].  # ? Vaginal bleeding-unclear etiology. Awaiting evaluation with  gynecology today.  # CKD stage III-  Stable.   # Iron deficiency anemia-  status post IV iron; on a monthly basis. Today hemoglobin is 11. Proceed with IV Feraheme today;   # Cirrhosis/ ? NASH- worked up with [place]. No decompensation noted.  # follow up in 2 months/labs/IV Ferrahem. We will order imaging at that time.   No orders of the defined types were placed in this encounter.  All questions were answered. The patient knows to call the clinic with any problems, questions or concerns.      Cammie Sickle, MD 11/02/2016 1:19 PM

## 2016-11-02 NOTE — Assessment & Plan Note (Addendum)
#   Metastatic stage IV kidney cancer- isolated metastases to the pancreas; status post resection of the tumor. Negative margins/one negative lymph node. My recommendation would be is to hold off any systemic therapy at this time. Would plan reimaging in approximately 3 months or so. Would recommend initiating treatment- if recurrence of disease noted on the imaging. Patient also awaiting evaluation with medical oncology at [facility] post surgery.  # Splenic artery occlusion/splenic infarct asymptomatic- likely incidental/secondary to surgery; not on anticoagulation- as per [facility].  # ? Vaginal bleeding-unclear etiology. Awaiting evaluation with gynecology today.  # CKD stage III-  Stable.   # Iron deficiency anemia-  status post IV iron; on a monthly basis. Today hemoglobin is 11. Proceed with IV Feraheme today;   # Cirrhosis/ ? NASH- worked up with [place]. No decompensation noted.  # follow up in 2 months/labs/IV Fer[place]hem. We will order imaging at that time.

## 2016-11-16 ENCOUNTER — Other Ambulatory Visit: Payer: Self-pay | Admitting: Internal Medicine

## 2016-11-16 DIAGNOSIS — R921 Mammographic calcification found on diagnostic imaging of breast: Secondary | ICD-10-CM

## 2016-11-29 ENCOUNTER — Other Ambulatory Visit: Payer: Medicare Other

## 2016-12-20 ENCOUNTER — Ambulatory Visit
Admission: RE | Admit: 2016-12-20 | Discharge: 2016-12-20 | Disposition: A | Discharge status: Home / Self Care | Payer: Medicare Other | Source: Ambulatory Visit | Attending: Internal Medicine | Admitting: Internal Medicine

## 2016-12-20 DIAGNOSIS — R921 Mammographic calcification found on diagnostic imaging of breast: Secondary | ICD-10-CM

## 2016-12-20 HISTORY — DX: Malignant neoplasm of pancreas, unspecified: C25.9

## 2016-12-26 ENCOUNTER — Encounter
Admission: RE | Admit: 2016-12-26 | Discharge: 2016-12-26 | Disposition: A | Discharge status: Home / Self Care | Payer: Medicare Other | Source: Ambulatory Visit | Attending: Obstetrics & Gynecology | Admitting: Obstetrics & Gynecology

## 2016-12-26 DIAGNOSIS — I10 Essential (primary) hypertension: Secondary | ICD-10-CM | POA: Diagnosis not present

## 2016-12-26 DIAGNOSIS — E559 Vitamin D deficiency, unspecified: Secondary | ICD-10-CM | POA: Diagnosis not present

## 2016-12-26 DIAGNOSIS — D259 Leiomyoma of uterus, unspecified: Secondary | ICD-10-CM | POA: Diagnosis not present

## 2016-12-26 DIAGNOSIS — Z79899 Other long term (current) drug therapy: Secondary | ICD-10-CM | POA: Diagnosis not present

## 2016-12-26 DIAGNOSIS — Z87891 Personal history of nicotine dependence: Secondary | ICD-10-CM | POA: Diagnosis not present

## 2016-12-26 DIAGNOSIS — N888 Other specified noninflammatory disorders of cervix uteri: Secondary | ICD-10-CM | POA: Diagnosis not present

## 2016-12-26 DIAGNOSIS — K660 Peritoneal adhesions (postprocedural) (postinfection): Secondary | ICD-10-CM | POA: Diagnosis not present

## 2016-12-26 DIAGNOSIS — Z794 Long term (current) use of insulin: Secondary | ICD-10-CM | POA: Diagnosis not present

## 2016-12-26 DIAGNOSIS — N950 Postmenopausal bleeding: Secondary | ICD-10-CM | POA: Diagnosis not present

## 2016-12-26 DIAGNOSIS — Z85528 Personal history of other malignant neoplasm of kidney: Secondary | ICD-10-CM | POA: Diagnosis not present

## 2016-12-26 DIAGNOSIS — E119 Type 2 diabetes mellitus without complications: Secondary | ICD-10-CM | POA: Diagnosis not present

## 2016-12-26 DIAGNOSIS — N72 Inflammatory disease of cervix uteri: Secondary | ICD-10-CM | POA: Diagnosis not present

## 2016-12-26 LAB — TYPE AND SCREEN
ABO/RH(D): A POS
ANTIBODY SCREEN: NEGATIVE

## 2016-12-26 LAB — BASIC METABOLIC PANEL
ANION GAP: 8 (ref 5–15)
BUN: 16 mg/dL (ref 6–20)
CHLORIDE: 106 mmol/L (ref 101–111)
CO2: 26 mmol/L (ref 22–32)
Calcium: 8.6 mg/dL — ABNORMAL LOW (ref 8.9–10.3)
Creatinine, Ser: 1.21 mg/dL — ABNORMAL HIGH (ref 0.44–1.00)
GFR calc Af Amer: 50 mL/min — ABNORMAL LOW (ref >60)
GFR calc non Af Amer: 43 mL/min — ABNORMAL LOW (ref >60)
GLUCOSE: 179 mg/dL — AB (ref 65–99)
POTASSIUM: 4.1 mmol/L (ref 3.5–5.1)
Sodium: 140 mmol/L (ref 135–145)

## 2016-12-26 LAB — CBC
HEMATOCRIT: 38.8 % (ref 35.0–47.0)
HEMOGLOBIN: 12.6 g/dL (ref 12.0–16.0)
MCH: 28.3 pg (ref 26.0–34.0)
MCHC: 32.5 g/dL (ref 32.0–36.0)
MCV: 87.1 fL (ref 80.0–100.0)
Platelets: 243 10*3/uL (ref 150–440)
RBC: 4.46 MIL/uL (ref 3.80–5.20)
RDW: 14.8 % — ABNORMAL HIGH (ref 11.5–14.5)
WBC: 12.4 10*3/uL — ABNORMAL HIGH (ref 3.6–11.0)

## 2016-12-26 LAB — SURGICAL PCR SCREEN
MRSA, PCR: NEGATIVE
Staphylococcus aureus: NEGATIVE

## 2016-12-26 NOTE — Pre-Procedure Instructions (Addendum)
error 

## 2016-12-26 NOTE — Patient Instructions (Signed)
Your procedure is scheduled VV:[id number] 2, 2108 Friday  Report to Same Day Surgery on the 2nd floor in the [facility]. To find out your arrival time, please call [phone] between 1PM - 3PM on: Thursday December 27, 2016   REMEMBER: Instructions that are not followed completely may result in serious medical risk up to and including death; or upon the discretion of your surgeon and anesthesiologist your surgery may need to be rescheduled.  Do not eat food or drink liquids after midnight. No gum chewing or hard candies.  You may however, drink CLEAR liquids up to 2 hours before you are scheduled to arrive at the hospital for your procedure.  Do not drink clear liquids within 2 hours of your scheduled arrival to the hospital as this may lead to your procedure being delayed or rescheduled.  Clear liquids include: - water   DO NOT drink anything not on this list.  Type 1 and Type 2 diabetics should only drink water.  No Alcohol for 24 hours before or after surgery.  No Smoking for 24 hours prior to surgery.  Notify your doctor if there is any change in your medical condition (cold, fever, infection).  Do not wear jewelry, make-up, hairpins, clips or nail polish.  Do not wear lotions, powders, or perfumes.   Do not shave 48 hours prior to surgery. Men may shave face and neck.  Contacts and dentures may not be worn into surgery.  Do not bring valuables to the hospital. [facility] is not responsible for any belongings or valuables.   TAKE THESE MEDICATIONS THE MORNING OF SURGERY WITH A SIP OF WATER: CITALOPRAM LEVOTHYROXINE BYSTOLIC PANTOPRAZOLE TAKE A DOSE NIGHT BEFORE SURGERY AND ONE DOSE IN AM OF SURGERY    Use CHG Soap or wipes as directed on instruction sheet.  .  UseFLONASE on the day of surgery.    Take 1/2 of usual insulin dose the night before surgery and none on the morning of surgery.   Stop Anti-inflammatories such as Advil, Aleve, Ibuprofen, Motrin,  Naproxen, Naprosyn, Goodie powder, or aspirin products. (May take Tylenol or Acetaminophen and Celebrex if needed.)  Stop supplements until after surgery. (May continue Vitamin D, Vitamin B, and multivitamin.)  If you are being admitted to the hospital overnight, leave your suitcase in the car. After surgery it may be brought to your room.  If you are being discharged the day of surgery, you will not be allowed to drive home. You will need someone to drive you home and stay with you that night.   If you are taking public transportation, you will need to have a responsible adult to with you.  Please call the number above if you have any questions about these instructions.

## 2016-12-26 NOTE — Pre-Procedure Instructions (Signed)
EKG note lead8/09/2016 [place] Component Name Value Ref Range  Vent Rate (bpm) 73   PR Interval (msec) 160   QRS Interval (msec) 82   QT Interval (msec) 408   QTc (msec) 449   Result Narrative  Normal sinus rhythm Normal ECG  When compared with ECG of 17-Sep-2016 12:12, No significant change was found I reviewed and concur with this report. Electronically signed IX:BOERQ, MD, AUGUSTUS ([id number]) on 10/03/2016 4:44:34 PM  Status

## 2016-12-26 NOTE — Pre-Procedure Instructions (Addendum)
Wrong chart

## 2016-12-27 MED ORDER — CEFAZOLIN SODIUM-DEXTROSE 2-4 GM/100ML-% IV SOLN
2.0000 g | INTRAVENOUS | Status: AC
  Administered 2016-12-28: 2 g via INTRAVENOUS

## 2016-12-27 NOTE — Pre-Procedure Instructions (Signed)
SPOKE WITH SR WARD WHO STATED DR Doy Hutching DID NOT NEED TO SEE PATIENT PREOP AND OK FOR SURGERY

## 2016-12-28 ENCOUNTER — Encounter
Admission: RE | Disposition: A | Discharge status: Home / Self Care | Payer: Self-pay | Source: Ambulatory Visit | Attending: Obstetrics & Gynecology

## 2016-12-28 ENCOUNTER — Ambulatory Visit: Payer: Medicare Other | Admitting: Certified Registered"

## 2016-12-28 ENCOUNTER — Encounter: Payer: Self-pay | Admitting: *Deleted

## 2016-12-28 ENCOUNTER — Ambulatory Visit
Admission: RE | Admit: 2016-12-28 | Discharge: 2016-12-28 | Disposition: A | Discharge status: Home / Self Care | Payer: Medicare Other | Source: Ambulatory Visit | Attending: Obstetrics & Gynecology | Admitting: Obstetrics & Gynecology

## 2016-12-28 DIAGNOSIS — I10 Essential (primary) hypertension: Secondary | ICD-10-CM | POA: Insufficient documentation

## 2016-12-28 DIAGNOSIS — Z79899 Other long term (current) drug therapy: Secondary | ICD-10-CM | POA: Insufficient documentation

## 2016-12-28 DIAGNOSIS — K660 Peritoneal adhesions (postprocedural) (postinfection): Secondary | ICD-10-CM | POA: Insufficient documentation

## 2016-12-28 DIAGNOSIS — N888 Other specified noninflammatory disorders of cervix uteri: Secondary | ICD-10-CM | POA: Insufficient documentation

## 2016-12-28 DIAGNOSIS — Z794 Long term (current) use of insulin: Secondary | ICD-10-CM | POA: Insufficient documentation

## 2016-12-28 DIAGNOSIS — N72 Inflammatory disease of cervix uteri: Secondary | ICD-10-CM | POA: Diagnosis not present

## 2016-12-28 DIAGNOSIS — Z87891 Personal history of nicotine dependence: Secondary | ICD-10-CM | POA: Insufficient documentation

## 2016-12-28 DIAGNOSIS — N950 Postmenopausal bleeding: Secondary | ICD-10-CM | POA: Insufficient documentation

## 2016-12-28 DIAGNOSIS — Z85528 Personal history of other malignant neoplasm of kidney: Secondary | ICD-10-CM | POA: Insufficient documentation

## 2016-12-28 DIAGNOSIS — E559 Vitamin D deficiency, unspecified: Secondary | ICD-10-CM | POA: Insufficient documentation

## 2016-12-28 DIAGNOSIS — D259 Leiomyoma of uterus, unspecified: Secondary | ICD-10-CM | POA: Insufficient documentation

## 2016-12-28 DIAGNOSIS — E119 Type 2 diabetes mellitus without complications: Secondary | ICD-10-CM | POA: Insufficient documentation

## 2016-12-28 HISTORY — PX: LAPAROSCOPIC BILATERAL SALPINGO OOPHERECTOMY: SHX5890

## 2016-12-28 HISTORY — PX: CYSTOSCOPY: SHX5120

## 2016-12-28 HISTORY — PX: LAPAROSCOPIC HYSTERECTOMY: SHX1926

## 2016-12-28 HISTORY — PX: LAPAROSCOPIC LYSIS OF ADHESIONS: SHX5905

## 2016-12-28 LAB — GLUCOSE, CAPILLARY
GLUCOSE-CAPILLARY: 264 mg/dL — AB (ref 65–99)
Glucose-Capillary: 184 mg/dL — ABNORMAL HIGH (ref 65–99)

## 2016-12-28 SURGERY — HYSTERECTOMY, TOTAL, LAPAROSCOPIC
Anesthesia: General

## 2016-12-28 MED ORDER — OXYCODONE HCL 5 MG PO TABS
ORAL_TABLET | ORAL | Status: AC
  Administered 2016-12-28: 5 mg via ORAL
  Filled 2016-12-28: qty 1

## 2016-12-28 MED ORDER — ONDANSETRON HCL 4 MG/2ML IJ SOLN
INTRAMUSCULAR | Status: DC | PRN
  Administered 2016-12-28 (×2): 4 mg via INTRAVENOUS

## 2016-12-28 MED ORDER — LIDOCAINE HCL (CARDIAC) 20 MG/ML IV SOLN
INTRAVENOUS | Status: DC | PRN
  Administered 2016-12-28: 50 mg via INTRAVENOUS

## 2016-12-28 MED ORDER — HEPARIN SODIUM (PORCINE) 5000 UNIT/ML IJ SOLN
5000.0000 [IU] | INTRAMUSCULAR | Status: AC
  Administered 2016-12-28: 5000 [IU] via SUBCUTANEOUS

## 2016-12-28 MED ORDER — ONDANSETRON HCL 4 MG/2ML IJ SOLN: 4.0000 mg | Freq: Once | INTRAMUSCULAR | Status: DC | PRN

## 2016-12-28 MED ORDER — ACETAMINOPHEN 10 MG/ML IV SOLN
INTRAVENOUS | Status: DC | PRN
  Administered 2016-12-28: 1000 mg via INTRAVENOUS

## 2016-12-28 MED ORDER — EVICEL 2 ML EX KIT
PACK | CUTANEOUS | Status: DC | PRN
  Administered 2016-12-28: 2 mL

## 2016-12-28 MED ORDER — ACETAMINOPHEN 500 MG PO TABS
ORAL_TABLET | ORAL | Status: AC
  Administered 2016-12-28: 1000 mg via ORAL
  Filled 2016-12-28: qty 1

## 2016-12-28 MED ORDER — PROPOFOL 10 MG/ML IV BOLUS
INTRAVENOUS | Status: DC | PRN
  Administered 2016-12-28: 200 mg via INTRAVENOUS

## 2016-12-28 MED ORDER — NEOSTIGMINE METHYLSULFATE 10 MG/10ML IV SOLN
INTRAVENOUS | Status: AC
  Filled 2016-12-28: qty 1

## 2016-12-28 MED ORDER — PROPOFOL 10 MG/ML IV BOLUS
INTRAVENOUS | Status: AC
  Filled 2016-12-28: qty 20

## 2016-12-28 MED ORDER — MIDAZOLAM HCL 2 MG/2ML IJ SOLN
INTRAMUSCULAR | Status: AC
  Filled 2016-12-28: qty 2

## 2016-12-28 MED ORDER — SODIUM CHLORIDE 0.9 % IV SOLN
INTRAVENOUS | Status: DC
  Administered 2016-12-28: 07:00:00 via INTRAVENOUS

## 2016-12-28 MED ORDER — EVICEL 2 ML EX KIT
PACK | CUTANEOUS | Status: AC
  Filled 2016-12-28: qty 1

## 2016-12-28 MED ORDER — ONDANSETRON HCL 4 MG/2ML IJ SOLN
INTRAMUSCULAR | Status: AC
  Filled 2016-12-28: qty 2

## 2016-12-28 MED ORDER — DEXAMETHASONE SODIUM PHOSPHATE 10 MG/ML IJ SOLN
INTRAMUSCULAR | Status: AC
  Filled 2016-12-28: qty 1

## 2016-12-28 MED ORDER — ACETAMINOPHEN 500 MG PO TABS
1000.0000 mg | ORAL_TABLET | Freq: Once | ORAL | Status: AC
  Administered 2016-12-28: 1000 mg via ORAL

## 2016-12-28 MED ORDER — IBUPROFEN 600 MG PO TABS
600.0000 mg | ORAL_TABLET | Freq: Four times a day (QID) | ORAL | 0 refills | Status: DC | PRN
Start: 2016-12-28 — End: 2019-03-19

## 2016-12-28 MED ORDER — HEPARIN SODIUM (PORCINE) 5000 UNIT/ML IJ SOLN
INTRAMUSCULAR | Status: AC
  Administered 2016-12-28: 5000 [IU] via SUBCUTANEOUS
  Filled 2016-12-28: qty 1

## 2016-12-28 MED ORDER — GABAPENTIN 300 MG PO CAPS
ORAL_CAPSULE | ORAL | Status: AC
  Administered 2016-12-28: 600 mg via ORAL
  Filled 2016-12-28: qty 2

## 2016-12-28 MED ORDER — ACETAMINOPHEN 500 MG PO TABS
ORAL_TABLET | ORAL | Status: AC
  Filled 2016-12-28: qty 2

## 2016-12-28 MED ORDER — FLUORESCEIN SODIUM 10 % IV SOLN
INTRAVENOUS | Status: AC
  Filled 2016-12-28: qty 5

## 2016-12-28 MED ORDER — ROCURONIUM BROMIDE 100 MG/10ML IV SOLN
INTRAVENOUS | Status: DC | PRN
  Administered 2016-12-28: 20 mg via INTRAVENOUS
  Administered 2016-12-28: 30 mg via INTRAVENOUS
  Administered 2016-12-28: 20 mg via INTRAVENOUS

## 2016-12-28 MED ORDER — CEFAZOLIN SODIUM-DEXTROSE 2-4 GM/100ML-% IV SOLN
INTRAVENOUS | Status: AC
  Filled 2016-12-28: qty 100

## 2016-12-28 MED ORDER — FENTANYL CITRATE (PF) 100 MCG/2ML IJ SOLN: 25.0000 ug | INTRAMUSCULAR | Status: DC | PRN

## 2016-12-28 MED ORDER — ACETAMINOPHEN NICU IV SYRINGE 10 MG/ML
INTRAVENOUS | Status: AC
  Filled 2016-12-28: qty 1

## 2016-12-28 MED ORDER — SUCCINYLCHOLINE CHLORIDE 20 MG/ML IJ SOLN
INTRAMUSCULAR | Status: AC
  Filled 2016-12-28: qty 1

## 2016-12-28 MED ORDER — GABAPENTIN 300 MG PO CAPS
600.0000 mg | ORAL_CAPSULE | Freq: Once | ORAL | Status: AC
  Administered 2016-12-28: 600 mg via ORAL

## 2016-12-28 MED ORDER — NEOSTIGMINE METHYLSULFATE 10 MG/10ML IV SOLN
INTRAVENOUS | Status: DC | PRN
  Administered 2016-12-28: 3 mg via INTRAVENOUS

## 2016-12-28 MED ORDER — MIDAZOLAM HCL 2 MG/2ML IJ SOLN
INTRAMUSCULAR | Status: DC | PRN
  Administered 2016-12-28: 1 mg via INTRAVENOUS

## 2016-12-28 MED ORDER — GLYCOPYRROLATE 0.2 MG/ML IJ SOLN
INTRAMUSCULAR | Status: DC | PRN
  Administered 2016-12-28: 0.2 mg via INTRAVENOUS
  Administered 2016-12-28: 0.4 mg via INTRAVENOUS

## 2016-12-28 MED ORDER — GLYCOPYRROLATE 0.2 MG/ML IJ SOLN
INTRAMUSCULAR | Status: AC
  Filled 2016-12-28: qty 3

## 2016-12-28 MED ORDER — LIDOCAINE HCL 2 % EX GEL
CUTANEOUS | Status: AC
  Filled 2016-12-28: qty 5

## 2016-12-28 MED ORDER — ROCURONIUM BROMIDE 50 MG/5ML IV SOLN
INTRAVENOUS | Status: AC
  Filled 2016-12-28: qty 1

## 2016-12-28 MED ORDER — OXYCODONE HCL 5 MG PO TABS: 5.0000 mg | ORAL_TABLET | ORAL | 0 refills | Status: AC | PRN

## 2016-12-28 MED ORDER — FENTANYL CITRATE (PF) 250 MCG/5ML IJ SOLN
INTRAMUSCULAR | Status: AC
  Filled 2016-12-28: qty 5

## 2016-12-28 MED ORDER — EPHEDRINE SULFATE 50 MG/ML IJ SOLN
INTRAMUSCULAR | Status: DC | PRN
  Administered 2016-12-28 (×5): 10 mg via INTRAVENOUS

## 2016-12-28 MED ORDER — OXYCODONE HCL 5 MG PO TABS
5.0000 mg | ORAL_TABLET | ORAL | Status: DC | PRN
  Administered 2016-12-28: 5 mg via ORAL

## 2016-12-28 MED ORDER — LACTATED RINGERS IV SOLN
INTRAVENOUS | Status: DC | PRN
  Administered 2016-12-28 (×2): via INTRAVENOUS

## 2016-12-28 MED ORDER — FENTANYL CITRATE (PF) 100 MCG/2ML IJ SOLN
INTRAMUSCULAR | Status: DC | PRN
  Administered 2016-12-28: 100 ug via INTRAVENOUS

## 2016-12-28 MED ORDER — EVICEL 5 ML EX KIT
PACK | CUTANEOUS | Status: DC | PRN
  Administered 2016-12-28: 5 mL

## 2016-12-28 MED ORDER — SUGAMMADEX SODIUM 200 MG/2ML IV SOLN
INTRAVENOUS | Status: AC
  Filled 2016-12-28: qty 2

## 2016-12-28 MED ORDER — CELECOXIB 200 MG PO CAPS
ORAL_CAPSULE | ORAL | Status: AC
  Administered 2016-12-28: 400 mg via ORAL
  Filled 2016-12-28: qty 2

## 2016-12-28 MED ORDER — CELECOXIB 200 MG PO CAPS
400.0000 mg | ORAL_CAPSULE | Freq: Once | ORAL | Status: AC
  Administered 2016-12-28: 400 mg via ORAL

## 2016-12-28 MED ORDER — DEXAMETHASONE SODIUM PHOSPHATE 10 MG/ML IJ SOLN
INTRAMUSCULAR | Status: DC | PRN
  Administered 2016-12-28: 10 mg via INTRAVENOUS

## 2016-12-28 MED ORDER — EVICEL 5 ML EX KIT
PACK | CUTANEOUS | Status: AC
  Filled 2016-12-28: qty 1

## 2016-12-28 MED ORDER — SUCCINYLCHOLINE CHLORIDE 20 MG/ML IJ SOLN
INTRAMUSCULAR | Status: DC | PRN
  Administered 2016-12-28: 100 mg via INTRAVENOUS

## 2016-12-28 SURGICAL SUPPLY — 69 items
BACTOSHIELD CHG 4% 4OZ (MISCELLANEOUS) ×2
BAG URO DRAIN 2000ML W/SPOUT (MISCELLANEOUS) ×5 IMPLANT
BLADE SURG SZ11 CARB STEEL (BLADE) ×5 IMPLANT
CANISTER SUCT 1200ML W/VALVE (MISCELLANEOUS) ×5 IMPLANT
CATH FOLEY 2WAY  5CC 16FR (CATHETERS) ×2
CATH ROBINSON RED A/P 16FR (CATHETERS) IMPLANT
CATH URTH 16FR FL 2W BLN LF (CATHETERS) ×3 IMPLANT
CHLORAPREP W/TINT 26ML (MISCELLANEOUS) ×10 IMPLANT
COUNTER NEEDLE 20/40 LG (NEEDLE) ×5 IMPLANT
DEFOGGER SCOPE WARMER CLEARIFY (MISCELLANEOUS) ×5 IMPLANT
DERMABOND ADVANCED (GAUZE/BANDAGES/DRESSINGS) ×2
DERMABOND ADVANCED .7 DNX12 (GAUZE/BANDAGES/DRESSINGS) ×3 IMPLANT
DEVICE PMI PUNCTURE CLOSURE (MISCELLANEOUS) ×5 IMPLANT
DRAPE LEGGINS SURG 28X43 STRL (DRAPES) ×5 IMPLANT
DRAPE SHEET LG 3/4 BI-LAMINATE (DRAPES) ×5 IMPLANT
DRAPE UNDER BUTTOCK W/FLU (DRAPES) ×5 IMPLANT
DRSG TEGADERM 2-3/8X2-3/4 SM (GAUZE/BANDAGES/DRESSINGS) IMPLANT
DRSG TELFA 4X3 1S NADH ST (GAUZE/BANDAGES/DRESSINGS) IMPLANT
ELECT REM PT RETURN 9FT ADLT (ELECTROSURGICAL) ×5
ELECTRODE REM PT RTRN 9FT ADLT (ELECTROSURGICAL) ×3 IMPLANT
GAUZE SPONGE NON-WVN 2X2 STRL (MISCELLANEOUS) ×6 IMPLANT
GLOVE PI ORTHOPRO 6.5 (GLOVE) ×12
GLOVE PI ORTHOPRO STRL 6.5 (GLOVE) ×18 IMPLANT
GLOVE SURG SYN 6.5 ES PF (GLOVE) ×15 IMPLANT
GOWN STRL REUS W/ TWL LRG LVL3 (GOWN DISPOSABLE) ×9 IMPLANT
GOWN STRL REUS W/ TWL XL LVL3 (GOWN DISPOSABLE) ×3 IMPLANT
GOWN STRL REUS W/TWL LRG LVL3 (GOWN DISPOSABLE) ×6
GOWN STRL REUS W/TWL XL LVL3 (GOWN DISPOSABLE) ×2
GRASPER SUT TROCAR 14GX15 (MISCELLANEOUS) IMPLANT
IRRIGATION STRYKERFLOW (MISCELLANEOUS) ×3 IMPLANT
IRRIGATOR STRYKERFLOW (MISCELLANEOUS) ×5
IV LACTATED RINGERS 1000ML (IV SOLUTION) ×5 IMPLANT
KIT PINK PAD W/HEAD ARE REST (MISCELLANEOUS) ×5
KIT PINK PAD W/HEAD ARM REST (MISCELLANEOUS) ×3 IMPLANT
KIT RM TURNOVER CYSTO AR (KITS) ×5 IMPLANT
L-HOOK LAP DISP 36CM (ELECTROSURGICAL) ×5
LABEL OR SOLS (LABEL) ×5 IMPLANT
LHOOK LAP DISP 36CM (ELECTROSURGICAL) ×3 IMPLANT
LIGASURE BLUNT 5MM 37CM (INSTRUMENTS) ×5 IMPLANT
MANIPULATOR UTERINE 4.5 ZUMI (MISCELLANEOUS) IMPLANT
MANIPULATOR VCARE LG CRV RETR (MISCELLANEOUS) IMPLANT
MANIPULATOR VCARE SML CRV RETR (MISCELLANEOUS) IMPLANT
MANIPULATOR VCARE STD CRV RETR (MISCELLANEOUS) ×5 IMPLANT
NS IRRIG 500ML POUR BTL (IV SOLUTION) ×5 IMPLANT
PACK LAP CHOLECYSTECTOMY (MISCELLANEOUS) ×5 IMPLANT
PAD OB MATERNITY 4.3X12.25 (PERSONAL CARE ITEMS) ×5 IMPLANT
PAD PREP 24X41 OB/GYN DISP (PERSONAL CARE ITEMS) ×5 IMPLANT
PENCIL ELECTRO HAND CTR (MISCELLANEOUS) ×5 IMPLANT
POUCH SPECIMEN RETRIEVAL 10MM (ENDOMECHANICALS) IMPLANT
SCISSORS METZENBAUM CVD 33 (INSTRUMENTS) IMPLANT
SCRUB CHG 4% DYNA-HEX 4OZ (MISCELLANEOUS) ×3 IMPLANT
SET CYSTO W/LG BORE CLAMP LF (SET/KITS/TRAYS/PACK) IMPLANT
SET YANKAUER POOLE SUCT (MISCELLANEOUS) IMPLANT
SLEEVE ENDOPATH XCEL 5M (ENDOMECHANICALS) ×10 IMPLANT
SPONGE VERSALON 2X2 STRL (MISCELLANEOUS) ×4
SURGILUBE 2OZ TUBE FLIPTOP (MISCELLANEOUS) ×5 IMPLANT
SUT MNCRL 4-0 (SUTURE) ×2
SUT MNCRL 4-0 27XMFL (SUTURE) ×3
SUT MNCRL AB 4-0 PS2 18 (SUTURE) ×5 IMPLANT
SUT VIC AB 0 CT1 36 (SUTURE) ×15 IMPLANT
SUT VIC AB 2-0 UR6 27 (SUTURE) ×5 IMPLANT
SUT VIC AB 4-0 PS2 18 (SUTURE) ×5 IMPLANT
SUTURE MNCRL 4-0 27XMF (SUTURE) ×3 IMPLANT
SYRINGE 10CC LL (SYRINGE) IMPLANT
TIP RIGID 35CM EVICEL (HEMOSTASIS) ×10 IMPLANT
TROCAR ENDO BLADELESS 11MM (ENDOMECHANICALS) IMPLANT
TROCAR XCEL NON-BLD 5MMX100MML (ENDOMECHANICALS) ×5 IMPLANT
TUBING INSUF HEATED (TUBING) ×5 IMPLANT
TUBING INSUFFLATOR HI FLOW (MISCELLANEOUS) ×5 IMPLANT

## 2016-12-28 NOTE — Discharge Instructions (Signed)
Discharge instructions:  °Call office if you have any of the following: fever >101 F, chills, excessive vaginal bleeding, incision drainage or problems, leg pain or redness, or any other concerns.  ° °Activity: Do not lift > 10 lbs for 8 weeks.  °No intercourse or tampons for 8 weeks.  °No driving for 1-2 weeks.  ° °You may feel some pain in your upper right abdomen/rib and right shoulder.  This is from the gas in the abdomen for surgery. This will subside over time, please be patient! ° °Take 600mg Ibuprofen and 1000mg Tylenol around the clock, every 6 hours for at least the first 3-5 days.  After this you can take as needed.  This will help decrease inflammation and promote healing.  The narcotics you'll take just as needed, as they just trick your brain into thinking its not in pain.   ° °Please don't limit yourself in terms of routine activity.  You will be able to do most things, although they may take longer to do or be a little painful.  You can do it! ° °Don't be a hero, but don't be a wimp either!  ° ° °AMBULATORY SURGERY  °DISCHARGE INSTRUCTIONS ° ° °1) The drugs that you were given will stay in your system until tomorrow so for the next 24 hours you should not: ° °A) Drive an automobile °B) Make any legal decisions °C) Drink any alcoholic beverage ° ° °2) You may resume regular meals tomorrow.  Today it is better to start with liquids and gradually work up to solid foods. ° °You may eat anything you prefer, but it is better to start with liquids, then soup and crackers, and gradually work up to solid foods. ° ° °3) Please notify your doctor immediately if you have any unusual bleeding, trouble breathing, redness and pain at the surgery site, drainage, fever, or pain not relieved by medication. ° ° ° °4) Additional Instructions: ° ° ° ° ° ° ° °Please contact your physician with any problems or Same Day Surgery at [phone], Monday through Friday 6 am to 4 pm, or [facility] [place] Main number at  [phone]. ° °

## 2016-12-28 NOTE — Anesthesia Post-op Follow-up Note (Signed)
Anesthesia QCDR form completed.        

## 2016-12-28 NOTE — Anesthesia Postprocedure Evaluation (Signed)
Anesthesia Post Note  Patient: Rebecca Kirby  Procedure(s) Performed: HYSTERECTOMY TOTAL LAPAROSCOPIC (N/A ) LAPAROSCOPIC BILATERAL SALPINGO OOPHORECTOMY (Bilateral ) CYSTOSCOPY LAPAROSCOPIC EXTENSIVE LYSIS OF ADHESIONS  Patient location during evaluation: PACU Anesthesia Type: General Level of consciousness: awake and alert Pain management: pain level controlled Vital Signs Assessment: post-procedure vital signs reviewed and stable Respiratory status: spontaneous breathing, nonlabored ventilation, respiratory function stable and patient connected to nasal cannula oxygen Cardiovascular status: blood pressure returned to baseline and stable Postop Assessment: no apparent nausea or vomiting Anesthetic complications: no     Last Vitals:  Vitals:   12/28/16 1226 12/28/16 1304  BP: (!) 119/40 (!) 118/44  Pulse: 65 (!) 56  Resp: 16 16  Temp: 36.7 C   SpO2: 92% 96%    Last Pain:  Vitals:   12/28/16 1304  TempSrc:   PainSc: 1                  Martha Clan

## 2016-12-28 NOTE — Interval H&P Note (Signed)
History and Physical Interval Note:  12/28/2016 7:07 AM  Rebecca Kirby  has presented today for surgery, with the diagnosis of PMB Complex Ovarian Mass  The various methods of treatment have been discussed with the patient and family. After consideration of risks, benefits and other options for treatment, the patient has consented to  Procedure(s): HYSTERECTOMY TOTAL LAPAROSCOPIC (N/A) LAPAROSCOPIC BILATERAL SALPINGO OOPHORECTOMY (Bilateral) as a surgical intervention .  The patient's history has been reviewed, patient examined, no change in status, stable for surgery.  I have reviewed the patient's chart and labs.  Questions were answered to the patient's satisfaction.     Animas

## 2016-12-28 NOTE — Anesthesia Procedure Notes (Signed)
Procedure Name: Intubation Date/Time: 12/28/2016 8:12 AM Performed by: Timoteo Expose Pre-anesthesia Checklist: Patient identified, Emergency Drugs available, Suction available, Patient being monitored and Timeout performed Patient Re-evaluated:Patient Re-evaluated prior to induction Oxygen Delivery Method: Circle system utilized Preoxygenation: Pre-oxygenation with 100% oxygen Induction Type: IV induction Laryngoscope Size: Mac and 3 Grade View: Grade II Tube type: Oral Placement Confirmation: ETT inserted through vocal cords under direct vision,  positive ETCO2,  CO2 detector and breath sounds checked- equal and bilateral Secured at: 22 cm Tube secured with: Tape Dental Injury: Teeth and Oropharynx as per pre-operative assessment

## 2016-12-28 NOTE — Progress Notes (Signed)
Inpatient Diabetes Program Recommendations  AACE/ADA: New Consensus Statement on Inpatient Glycemic Control (2015)  Target Ranges:  Prepandial:   less than 140 mg/dL      Peak postprandial:   less than 180 mg/dL (1-2 hours)      Critically ill patients:  140 - 180 mg/dL    Results for Rebecca Kirby, Rebecca Kirby (MRN [id number]) as of 12/28/2016 12:06  Ref. Range 12/28/2016 06:15 12/28/2016 11:14  Glucose-Capillary Latest Ref Range: 65 - 99 mg/dL 184 (H) 264 (H)   Admit with: PMB Complex Adnexal Mass, history of metastatic renal cell cancer.  History: DM  Home DM Meds: Levemir 60 units QHS       Humalog 18-34 units TID  Current Insulin Orders: None yet (still in PACU)      MD- Note patient now post-op.    Please consider the following:  1. Start Levemir 30 units QHS tonight (50% home dose to start)  2. Start Novolog Moderate Correction Scale/ SSI (0-15 units) Q4 hours      --Will follow patient during hospitalization--  Wyn Quaker RN, MSN, CDE Diabetes Coordinator Inpatient Glycemic Control Team Team Pager: [phone] (8a-5p)

## 2016-12-28 NOTE — Op Note (Addendum)
Total Laparoscopic Hysterectomy Operative Note Procedure Date: 12/28/2016  Patient:  Rebecca Kirby  71 y.o. female  PRE-OPERATIVE DIAGNOSIS:  PMB Complex Adnexal Mass, history of metastatic renal cell cancer.  POST-OPERATIVE DIAGNOSIS:  PMB, Fibroid uterus, abdominal adhesions  PROCEDURE:  Procedure(s): HYSTERECTOMY TOTAL LAPAROSCOPIC (N/A) LAPAROSCOPIC BILATERAL SALPINGO OOPHORECTOMY (Bilateral) CYSTOSCOPY LAPAROSCOPIC EXTENSIVE LYSIS OF ADHESIONS  SURGEON:  Surgeon(s) and Role:    * Zarahi Fuerst, Honor Loh, MD - Primary    * Schermerhorn, Gwen Her, MD  ANESTHESIA:  General via ET  I/O  Total I/O In: 1200 [I.V.:1200] Out: 900 [Urine:400; Blood:500]  FINDINGS:  Small uterus, normal ovaries and fallopian tubes bilaterally.  Right lateral LUS pedunculated fibroid, posterior culdesac and right adnexal adhesions. Upper abdomen unable to be visualized due to omental adhesions: omentum adherent to anterior abdominal wall extensively, in its entirety, to the pelvis. The uterine manipulator perforated through the fundus of the uterus upon entry. On Cystoscopy: left ureteral jet.  SPECIMEN: Uterus, Cervix, and bilateral fallopian tubes  COMPLICATIONS: none apparent  DISPOSITION: vital signs stable to PACU  Indication for Surgery: 71 y.o. with history of metastatic renal cell cancer, who developed postmenopausal bleeding and was found to have a benign endometrial biopsy, but complex adnexa on ultrasound. CA 125 was minimally elevated at 38.9. Elected for surgical removal of organs.  Risks of surgery were discussed with the patient including but not limited to: bleeding which may require transfusion or reoperation; infection which may require antibiotics; injury to bowel, bladder, ureters or other surrounding organs; need for additional procedures including laparotomy, blood clot, incisional problems and other postoperative/anesthesia complications. Written informed consent was obtained.       PROCEDURE IN DETAIL:  The patient had 5000u Heparin Sub-q and sequential compression devices applied to her lower extremities while in the preoperative area.  She was then taken to the operating room. IV antibiotics were given. General anesthesia was administered via endotracheal route.  She was placed in the dorsal lithotomy position, and was prepped and draped in a sterile manner. A surgical time-out was performed.  A Foley catheter was inserted into her bladder and attached to constant drainage and a V-Care uterine manipulator was then advanced into the uterus and a good fit around the cervix was noted. It was noted that the manipulator had advanced too far and had likely perforated through the uterus. The gloves were changed, and attention was turned to the abdomen where an umbilical incision was made with the scalpel.  A 39mm trochar was inserted in the umbilical incision using a visiport method.Opening pressure was 24mmHg, and the abdomen was insufflated to 15mg Hg carbon dioxide gas and adequate pneumoperitoneum was obtained. A survey of the patient's pelvis and abdomen revealed the findings as mentioned above. Two 30mm ports were inserted in the lower left and right quadrants under visualization.    The Ligasure was used to dissect down the omental adhesions so that the umbilical trochar could be used without obstruction of the visual field.  There was blood in the pelvis and perforation of the uterus at the fundus was confirmed.  The Left ureter was seen vermiculating transperitoneally and away from the left IP ligament.  The bilateral IP ligaments were thrice sealed and divided with the Ligasure. The bilateral round ligaments were transected and anterior broad ligament divided and brought across the uterus to separate the vesicouterine peritoneum and create a bladder flap. The bladder was pushed away from the uterus. The bilateral uterine arteries were skeletonized, ligated and  transected. The  bilateral uterosacral and cardinal ligaments were ligated and transected. The V-Care was not in adequate position for colpotomy, so it was removed and reinserted.  A colpotomy was made around the V-Care cervical cup.  The cup again became displaced, and the Ligasure was used to complete the colpotomy across the uterosacral ligaments and posterior culdesac.  The attention was turned to the vagina.  A tenaculum was placed and grasped the cervix under laparoscopic visualization,  and the uterus, cervix, and bilateral tubes were removed through the vagina. The vaginal cuff was closed vaginally using 0-Vicryl in a running locking stitch. This was tested for integrity using the surgeon's finger. After a change of gloves, the pneumoperitoneum was recreated and surgical site inspected.  There was bleeding from the right side of the vaginal cuff.  Kleppender, bovie cautery, and the ligasure were used to create hemostasis.  The pneumoperitoneum was decreased to 28mmHg and hemostasis was confirmed.  EvaSeal was placed over the cuff to ensure thrombosis and hemostasis postoperatively.  No intraoperative injury to surrounding organs was noted. The abdomen was desufflated and all instruments were then removed.   The attention was turned to the bladder.  A 70-degree cystoscope was inserted into the urethra after the foley was removed.  The bladder was inflated with 300cc of normal saline, and inspection of the bladder wall was performed.  Bilateral ureteral jets were observed.  The cystoscope was removed.  All skin incisions were closed with 4-0 monocryl and covered with surgical glue. The patient tolerated the procedures well.  All instruments, needles, and sponge counts were correct x 2. The patient was taken to the recovery room in stable condition.   ---- Larey Days, MD Attending Obstetrician and [facility]

## 2016-12-28 NOTE — Transfer of Care (Signed)
Immediate Anesthesia Transfer of Care Note  Patient: Rebecca Kirby  Procedure(s) Performed: HYSTERECTOMY TOTAL LAPAROSCOPIC (N/A ) LAPAROSCOPIC BILATERAL SALPINGO OOPHORECTOMY (Bilateral ) CYSTOSCOPY LAPAROSCOPIC EXTENSIVE LYSIS OF ADHESIONS  Patient Location: PACU  Anesthesia Type:General  Level of Consciousness: patient cooperative  Airway & Oxygen Therapy: Patient Spontanous Breathing  Post-op Assessment: Report given to RN  Post vital signs: Reviewed  Last Vitals:  Vitals:   12/28/16 0620  BP: (!) 151/62  Pulse: (!) 58  Resp: 14  Temp: 36.6 C  SpO2: 96%    Last Pain:  Vitals:   12/28/16 0620  TempSrc: Oral         Complications: No apparent anesthesia complications

## 2016-12-28 NOTE — H&P (Addendum)
Chief Complaint:   Patient ID: Rebecca Kirby is a 71 y.o. female presenting with Postmenopausal bleeding on 12/14/2016  HPI: Post menopausal bleeding and complex ovarian cysts, and slightly elevated CA 125 (38.1 is normal hers is 38.9).   Workup:   EMBx: strips of inactive endometrium, no hyperplasia or malignancy. TVUS: Ut retroverted Fibroids seen: 1)calc=1.6cm 2)2cm Fluid filled endo=15.82m; walls=0.26 + 0.31cm=0.57cm (5.734m FF seen in PCDS and Rt adnexa LOV appears wnl Complex ROV with calc (volume of 9.5943m ? Dermoid  Tumor markers:   11/05/2016  Cancer Antigen (CA) 125 - [facility] 0.0 - 38.1 U/mL 38.9 (H)  Lactate Dehydrogenase - [facility] 119 - 226 IU/L 228 (H)  AFP, Serum, Tumor Marker - [facility] 0.0 - 8.3 ng/mL 2.0  Inhibin B - [facility] 0.0 - 16.9 pg/mL <7.0     She is interested in hysterectomy and BSO.   Past Medical History: has a past medical history of Cancer of right kidney (CMS-HCC) (03/2006), Cirrhosis (CMS-HCC), Colon cancer (CMS-HCC), History of bone density study, History of bone density study, History of fracture of finger, History of fracture of wrist, Hypertension, Osteoarthritis of both knees, Post-surgical hypothyroidism, Renal cancer (CMS-HCC), Renal insufficiency, Type 2 diabetes mellitus (CMS-HCC), and Vitamin D deficiency, unspecified.  Past Surgical History: has a past surgical history that includes Nephrectomy (Right, 2008); Cholecystectomy (05/2010); Arthroscopy Knee (Right, 06/2000); Colonoscopy (04/25/2006); Colonoscopy (01/03/2007); Colonoscopy (01/24/2009); Colonoscopy (05/17/2010); Colonoscopy (06/30/2012); Colonoscopy (12/25/2013); egd (12/25/2013); Thyroidectomy Total (11/2013); Colonoscopy (09/01/2015); egd (09/01/2015); Arthroplasty Total Knee (Right, 07/17/2010); Arthroplasty Total Knee (Left, 08/09/2014); LAPAROSCOPY, SURGICAL; ABDOMEN, PERITONEUM, AND OMENTUM, DIAGNOSTIC, WITH OR WITHOUT COLLECTION OF SPECIMEN(S) BY BRUSHING OR WASHING  (SEPARATE PROCEDURE) (N/A, 10/01/2016); and PANCREATECTOMY, PROXIMAL SUBTOTAL WITH TOTAL DUODENECTOMY, PARTIAL GASTRECTOMY, CHOLEDOCHOENTEROSTOMY AND GASTROJEJUNOSTOMY (WHIPPLE-TYPE PROCEDURE); WITH PANCREATOJEJUNOSTOMY (N/A, 10/01/2016). Family History: family history includes Anesthesia problems in her daughter; Asthma in her father; Breast cancer in her cousin; Colon cancer in her cousin and cousin; Diabetes type II in her mother; High blood pressure (Hypertension) in her father and mother; Myocardial Infarction (Heart attack) in her mother; No Known Problems in her son; Stroke in her mother. Social History: reports that she quit smoking about 22 years ago. Her smoking use included cigarettes. She has a 3.00 pack-year smoking history. She has never used smokeless tobacco. She reports that she does not drink alcohol or use drugs. OB/GYN History:  OB History  None   Allergies: has No Known Allergies. Medications:  Current Outpatient Medications:  . amLODIPine-olmesartan (AZOR) 5-20 mg tablet, Take 1 tablet by mouth once daily., Disp: 90 tablet, Rfl: 3 . blood glucose diagnostic test strip, Use 5 (five) times daily. Use as instructed. Freestyle test strips., Disp: 150 each, Rfl: 12 . blood glucose meter kit, Use as directed., Disp: 1 each, Rfl: 0 . BYSTOLIC 5 mg tablet, TAKE 1 TABLET BY MOUTH ONCE DAILY, Disp: 90 tablet, Rfl: 3 . calcium acetate (PHOSLO) 667 mg capsule, TAKE 1 CAPSULE BY MOUTH TWO TIMES DAILY, Disp: 180 capsule, Rfl: 3 . cholecalciferol (VITAMIN D3) 1,000 unit capsule, Take 1,000 Units by mouth once daily., Disp: , Rfl:  . citalopram (CELEXA) 40 MG tablet, Take 1 tablet (40 mg total) by mouth once daily., Disp: 90 tablet, Rfl: 3 . cyanocobalamin (VITAMIN B12) 1000 MCG tablet, Take 1,000 mcg by mouth once daily., Disp: , Rfl:  . fexofenadine (ALLEGRA) 180 MG tablet, Take 180 mg by mouth once daily as needed. , Disp: , Rfl:  . fluticasone (FLONASE) 50 mcg/actuation nasal spray, Place  2  sprays into both nostrils once daily., Disp: 16 g, Rfl: 3 . HYDROcodone-acetaminophen (NORCO) 10-325 mg tablet, Take 1 tablet by mouth every 6 (six) hours as needed for Pain., Disp: , Rfl:  . insulin LISPRO (HUMALOG KWIKPEN) pen injector (concentration 100 units/mL), May use up to 50 units daily in divided doses, Disp: 15 mL, Rfl: 5 . lancets, Use 1 each 5 (five) times daily., Disp: 150 each, Rfl: 12 . levothyroxine (SYNTHROID, LEVOTHROID) 175 MCG tablet, TAKE 1 TABLET BY MOUTH ONCE DAILY ON AN EMPTY STOMACH WITH A GLASS OF WATER 30-60 MINUTES BEFORE BREAKFAST, Disp: 90 tablet, Rfl: 3 . pantoprazole (PROTONIX) 40 MG DR tablet, TAKE 1 TABLET BY MOUTH ONCE DAILY, Disp: 90 tablet, Rfl: 3 . pen needle, diabetic 29 gauge x 1/2" needle, USE AS DIRECTED 4 TIMES DAILY, Disp: 150 each, Rfl: 12  Review of Systems: No SOB, no palpitations or chest pain, no new lower extremity edema, no nausea or vomiting or bowel or bladder complaints. See HPI for gyn specific ROS.  Exam:   Constitutional: BP (!) 151/62   Pulse (!) 58   Temp 97.8 F (36.6 C) (Oral)   Resp 14   SpO2 96%   WDWN white female in NAD  HEENT: sclera clear, non-icteric, moist mucous membranes, dentition intact Endocrine: no thyromegaly Respiratory: normal respiratory effort  CV: no peripheral edema Breast: exam done in sitting and lying position : No dimpling or retraction, no dominant mass, no spontaneous discharge, Lymph: no axillary adenopathy Skin: no gross lesions GI: soft , no mass, normal active bowel sounds, non-tender, no rebound tenderness GU: tanner stage 5 ,  External genitalia/skin: vulva /labia no lesions Lymphatic: no enlarged inguinal nodes bilaterally Urethra: no prolapse, no diverticulum, no caruncle Bladder: no tenderness to palpation, no cystocele Vagina: normal physiologic d/c, no lesions, normal apical support Cervix: no lesions, no cervical motion tenderness  Uterus: normal size shape and contour,  non-tender, mobile Adnexa: no masses bilaterally, non-tender  Rectovaginal: no mass, no nodularity, no fullness in posterior culdesac, heme negative Skin: warm and well perfused, no rashes Neuro: alert, oriented x3,  Psych: appropriate mood and insight, judgement intact  Impression:   The primary encounter diagnosis was Post-menopausal bleeding. Diagnoses of Complex ovarian cyst and Preoperative exam for gynecologic surgery were also pertinent to this visit.  Plan:   With the complex ovarian cyst, and unexplained PMB, and incredible cancer history, she has elected to have her pelvic organs removed. She prefers to not have to deal with this in any ongoing situations, nor worry about it.   Planned procedure: TLH BSO  The patient and I discussed the technical aspects of the procedure including the potential for risks and complications.These include but are not limited to the risk of infection requiring post-operative antibiotics or further procedures.We talked about the risk of injury to adjacent organs including bladder, bowel, ureter, blood vessels or nerves, the need to convert to an open incision, possibleneed for blood transfusion andpostop complications such asthromboembolic or cardiopulmonary complications.All of her questions were answered. Her preoperative exam was completed andthe appropriate consents were signed.    Tyashia Morrisette CRIST Zaliyah Meikle, MD

## 2016-12-28 NOTE — Anesthesia Preprocedure Evaluation (Signed)
Anesthesia Evaluation  Patient identified by MRN, date of birth, ID band Patient awake    Reviewed: Allergy & Precautions, H&P , NPO status , Patient's Chart, lab work & pertinent test results, reviewed documented beta blocker date and time   History of Anesthesia Complications Negative for: history of anesthetic complications  Airway Mallampati: IV  TM Distance: >3 FB Neck ROM: full    Dental  (+) Edentulous Upper, Upper Dentures, Partial Lower, Poor Dentition, Missing   Pulmonary neg pulmonary ROS, former smoker,           Cardiovascular Exercise Tolerance: Good hypertension, (-) angina(-) CAD, (-) Past MI, (-) Cardiac Stents and (-) CABG + dysrhythmias Atrial Fibrillation and Supra Ventricular Tachycardia (-) Valvular Problems/Murmurs     Neuro/Psych negative neurological ROS  negative psych ROS   GI/Hepatic GERD  Medicated,NAFLD   Endo/Other  negative endocrine ROSdiabetes, Well Controlled, Oral Hypoglycemic AgentsHypothyroidism   Renal/GU CRFRenal disease  negative genitourinary   Musculoskeletal   Abdominal   Peds  Hematology  (+) Blood dyscrasia, anemia ,   Anesthesia Other Findings Past Medical History:   Anxiety                                                      Hypertension                                                 Hypothyroidism                                               Diabetes mellitus without complication (HCC)                 GERD (gastroesophageal reflux disease)                       Anemia                                                       Kidney cancer, primary, with metastasis from k* 08/26/2014    Colon cancer (HCC)                                           Thyroid cancer ([place])                                         Reproductive/Obstetrics negative OB ROS                             Anesthesia Physical  Anesthesia Plan  ASA: III  Anesthesia Plan:  General   Post-op Pain Management:    Induction: Intravenous  PONV Risk  Score and Plan: 3 and Ondansetron and Dexamethasone  Airway Management Planned: Oral ETT  Additional Equipment:   Intra-op Plan:   Post-operative Plan: Extubation in OR  Informed Consent: I have reviewed the patients History and Physical, chart, labs and discussed the procedure including the risks, benefits and alternatives for the proposed anesthesia with the patient or authorized representative who has indicated his/her understanding and acceptance.   Dental Advisory Given  Plan Discussed with: Anesthesiologist, CRNA and Surgeon  Anesthesia Plan Comments:         Anesthesia Quick Evaluation

## 2016-12-31 LAB — SURGICAL PATHOLOGY

## 2017-01-04 ENCOUNTER — Inpatient Hospital Stay: Payer: Medicare Other

## 2017-01-04 ENCOUNTER — Inpatient Hospital Stay: Payer: Medicare Other | Admitting: Internal Medicine

## 2017-01-04 ENCOUNTER — Telehealth: Payer: Self-pay | Admitting: *Deleted

## 2017-01-04 NOTE — Telephone Encounter (Signed)
Pt no show today for md/iron infusion apts. Reviewed chart- patient had a hysterectomy 1 week ago. MD made aware. sch team reached out to pt- Talked to daughter and she said that the pt cancelled that appt several days ago. Per scheduling- daughter will call back and reschedule at a later time. She declined to r/s the apts at this time.

## 2017-02-08 IMAGING — CT CT HEAD W/O CM
3 series · 15 of 47 positions shown, 18 images · non-contrast
Comparison: 04/30/2006.

CLINICAL DATA: Neck surgery 2 days ago with left-sided facial
numbness since surgery.

EXAM:
CT HEAD WITHOUT CONTRAST
TECHNIQUE: Contiguous axial images were obtained from the base of the skull
through the vertex without intravenous contrast.

[Series 3: head wo · axial · 0.41mm/px · z∈[+532,+667]mm · 9 of 33 slices shown, 12 images]
[im 3/33  brain]
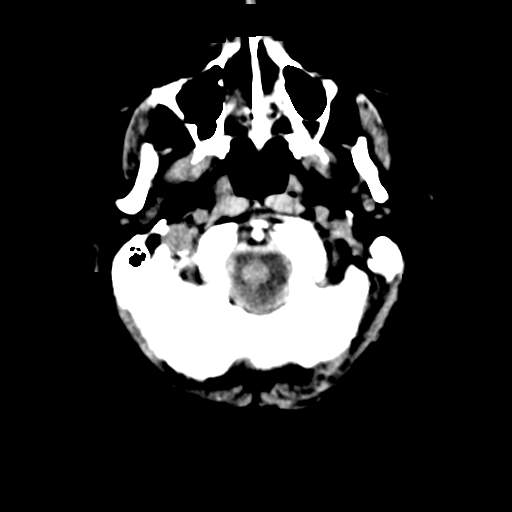
[im 3/33  bone]
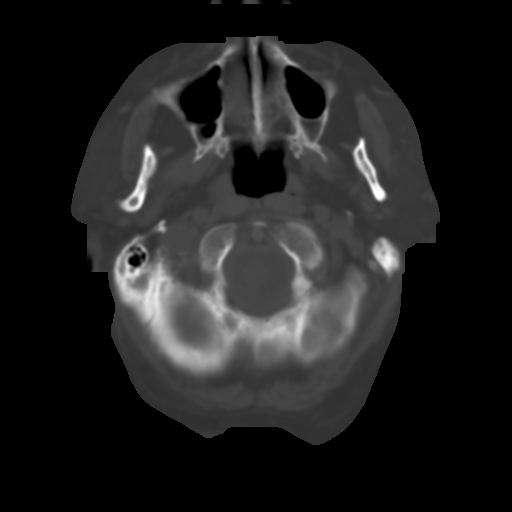
[im 6/33  brain]
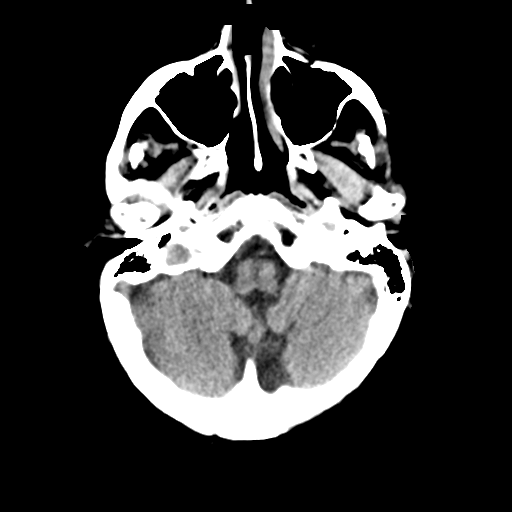
[im 9/33  brain]
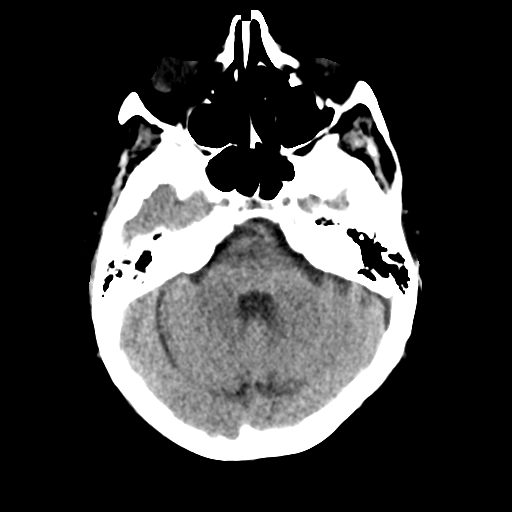
[im 13/33  brain]
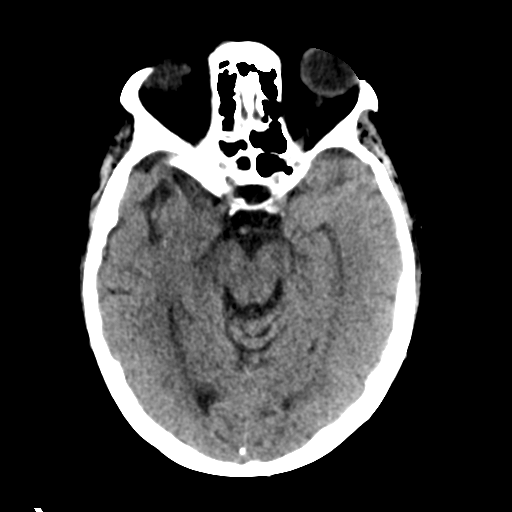
[im 17/33  brain]
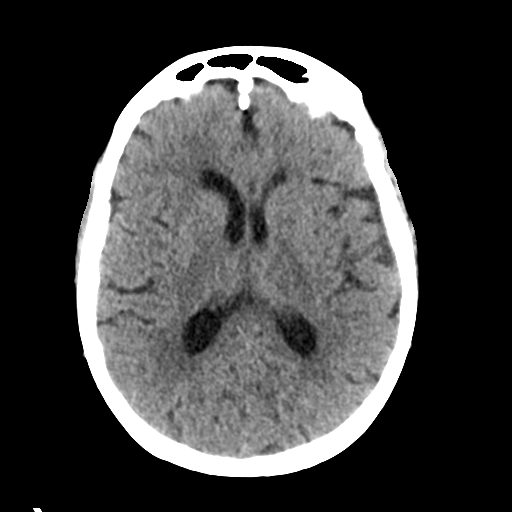
[im 17/33  bone]
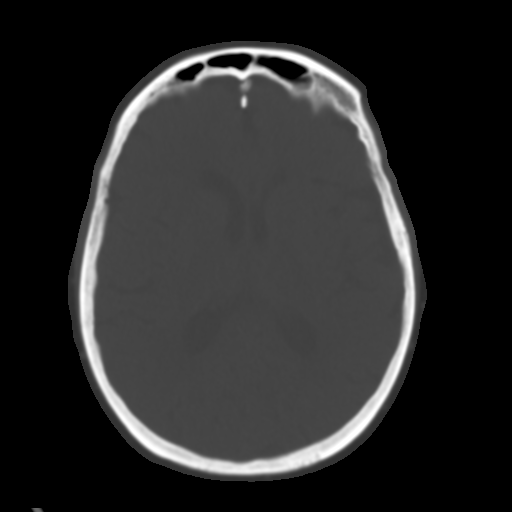
[im 20/33  brain]
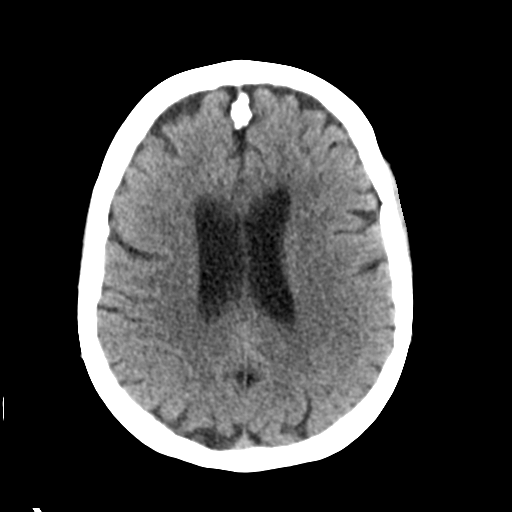
[im 24/33  brain]
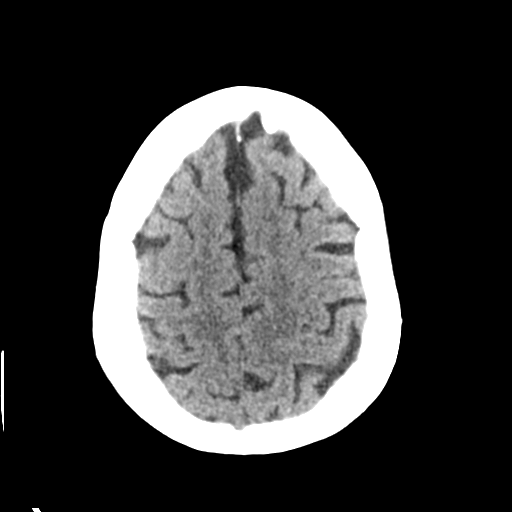
[im 27/33  brain]
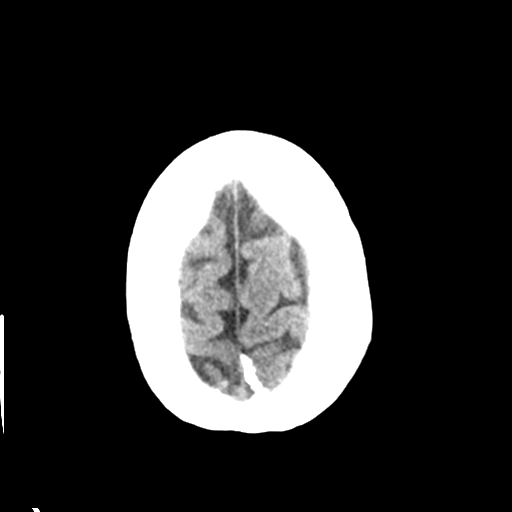
[im 30/33  brain]
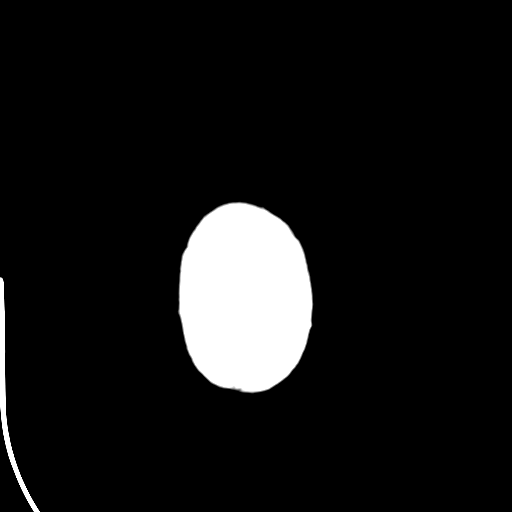
[im 30/33  bone]
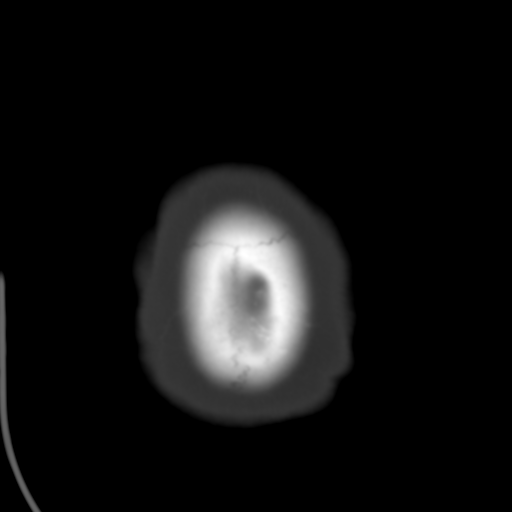

[Series 5: coronal soft tissue · coronal · 0.32mm/px · 3 of 60 slices shown]
[im 20/60  brain]
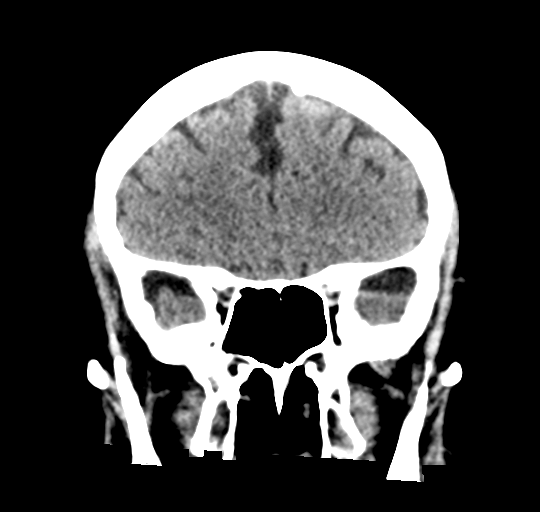
[im 27/60  brain]
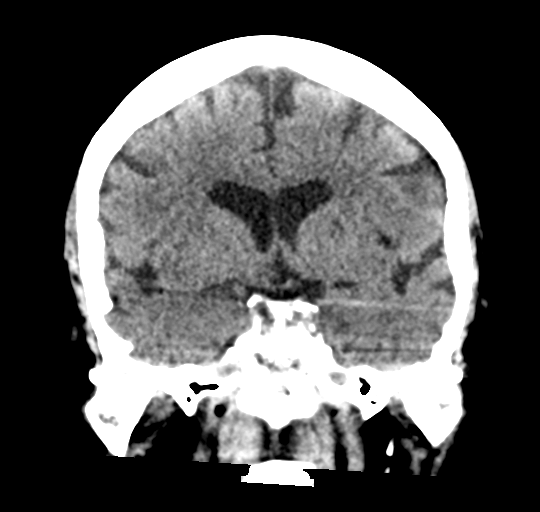
[im 33/60  brain]
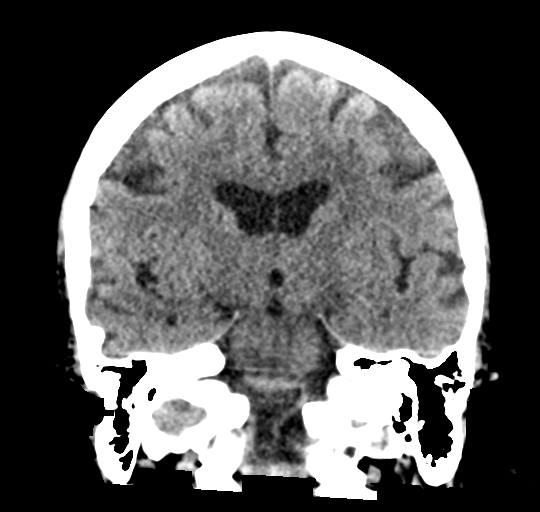

[Series 6: sagittal soft tissue · sagittal · 0.31mm/px · 3 of 48 slices shown]
[im 16/48  brain]
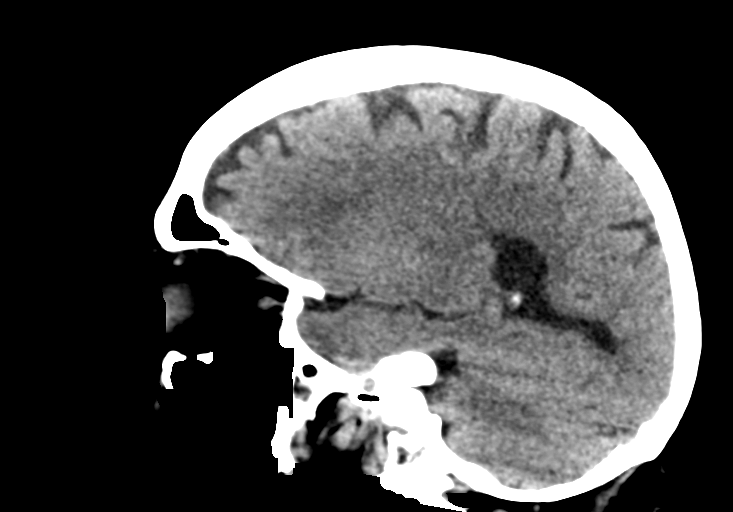
[im 24/48  brain]
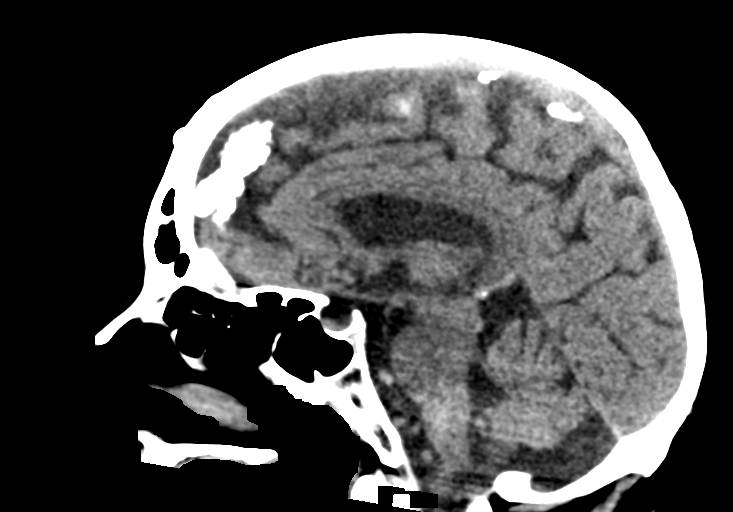
[im 32/48  brain]
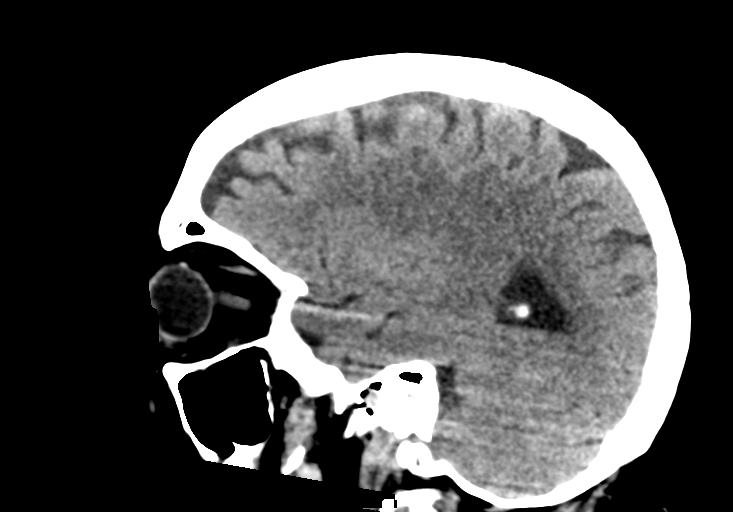

[15 of 47 positions shown; findings below may reference images not displayed]

FINDINGS: Ventricles, cisterns and other CSF spaces are within normal. There
is mild chronic ischemic microvascular disease. There is no mass,
mass effect, shift of midline structures or acute hemorrhage. There
is no evidence to suggest acute infarction. Remaining bony
structures and soft tissues are within normal.
IMPRESSION: No acute intracranial findings per

Mild chronic ischemic microvascular disease.

## 2017-04-24 ENCOUNTER — Encounter: Payer: Self-pay | Admitting: *Deleted

## 2017-04-24 NOTE — Progress Notes (Signed)
Reviewed chart as patient has not follow-up in cancer center. Has not called back to r/s apts. Appears, Patient is currently receiving care at [facility] oncology/hem/and gastro. At this time, will wait on daughter to contact cancer center to determine if pt would like to continue to follow-up with Dr. Jacinto Reap.

## 2017-05-06 ENCOUNTER — Other Ambulatory Visit: Payer: Self-pay | Admitting: Internal Medicine

## 2017-05-06 DIAGNOSIS — M7989 Other specified soft tissue disorders: Secondary | ICD-10-CM

## 2017-05-28 ENCOUNTER — Ambulatory Visit
Admission: RE | Admit: 2017-05-28 | Discharge: 2017-05-28 | Disposition: A | Discharge status: Home / Self Care | Payer: Medicare Other | Source: Ambulatory Visit | Attending: Internal Medicine | Admitting: Internal Medicine

## 2017-05-28 DIAGNOSIS — M7989 Other specified soft tissue disorders: Secondary | ICD-10-CM | POA: Diagnosis not present

## 2018-02-06 ENCOUNTER — Other Ambulatory Visit: Payer: Self-pay | Admitting: Internal Medicine

## 2018-02-06 DIAGNOSIS — R921 Mammographic calcification found on diagnostic imaging of breast: Secondary | ICD-10-CM

## 2018-03-03 ENCOUNTER — Ambulatory Visit
Admission: RE | Admit: 2018-03-03 | Discharge: 2018-03-03 | Disposition: A | Discharge status: Home / Self Care | Payer: Medicare Other | Source: Ambulatory Visit | Attending: Internal Medicine | Admitting: Internal Medicine

## 2018-03-03 DIAGNOSIS — R921 Mammographic calcification found on diagnostic imaging of breast: Secondary | ICD-10-CM | POA: Diagnosis not present

## 2018-04-01 ENCOUNTER — Other Ambulatory Visit: Payer: Self-pay | Admitting: Orthopedic Surgery

## 2018-04-01 ENCOUNTER — Other Ambulatory Visit (HOSPITAL_COMMUNITY): Payer: Self-pay | Admitting: Orthopedic Surgery

## 2018-04-01 DIAGNOSIS — Z96659 Presence of unspecified artificial knee joint: Principal | ICD-10-CM

## 2018-04-01 DIAGNOSIS — T8484XA Pain due to internal orthopedic prosthetic devices, implants and grafts, initial encounter: Secondary | ICD-10-CM

## 2018-04-07 ENCOUNTER — Telehealth (HOSPITAL_COMMUNITY): Payer: Self-pay

## 2018-04-08 ENCOUNTER — Telehealth (HOSPITAL_COMMUNITY): Payer: Self-pay

## 2019-02-04 ENCOUNTER — Other Ambulatory Visit: Payer: Self-pay | Admitting: Internal Medicine

## 2019-02-04 DIAGNOSIS — Z1231 Encounter for screening mammogram for malignant neoplasm of breast: Secondary | ICD-10-CM

## 2019-03-09 ENCOUNTER — Ambulatory Visit
Admission: RE | Admit: 2019-03-09 | Discharge: 2019-03-09 | Disposition: A | Discharge status: Home / Self Care | Payer: Medicare Other | Source: Ambulatory Visit | Attending: Internal Medicine | Admitting: Internal Medicine

## 2019-03-09 ENCOUNTER — Other Ambulatory Visit: Payer: Self-pay

## 2019-03-09 DIAGNOSIS — Z1231 Encounter for screening mammogram for malignant neoplasm of breast: Secondary | ICD-10-CM | POA: Insufficient documentation

## 2019-03-12 ENCOUNTER — Other Ambulatory Visit: Payer: Self-pay | Admitting: Internal Medicine

## 2019-03-12 DIAGNOSIS — R928 Other abnormal and inconclusive findings on diagnostic imaging of breast: Secondary | ICD-10-CM

## 2019-03-12 DIAGNOSIS — R921 Mammographic calcification found on diagnostic imaging of breast: Secondary | ICD-10-CM

## 2019-03-19 ENCOUNTER — Emergency Department: Payer: Medicare Other

## 2019-03-19 ENCOUNTER — Other Ambulatory Visit: Payer: Self-pay

## 2019-03-19 ENCOUNTER — Inpatient Hospital Stay: Payer: Medicare Other

## 2019-03-19 ENCOUNTER — Inpatient Hospital Stay
Admission: EM | Admit: 2019-03-19 | Discharge: 2019-04-07 | DRG: 177 | Disposition: A | Discharge status: Home Health | Payer: Medicare Other | Attending: Internal Medicine | Admitting: Internal Medicine

## 2019-03-19 ENCOUNTER — Encounter: Payer: Self-pay | Admitting: Emergency Medicine

## 2019-03-19 DIAGNOSIS — E871 Hypo-osmolality and hyponatremia: Secondary | ICD-10-CM | POA: Diagnosis present

## 2019-03-19 DIAGNOSIS — I5031 Acute diastolic (congestive) heart failure: Secondary | ICD-10-CM | POA: Diagnosis present

## 2019-03-19 DIAGNOSIS — U071 COVID-19: Principal | ICD-10-CM

## 2019-03-19 DIAGNOSIS — R531 Weakness: Secondary | ICD-10-CM

## 2019-03-19 DIAGNOSIS — K7030 Alcoholic cirrhosis of liver without ascites: Secondary | ICD-10-CM | POA: Diagnosis not present

## 2019-03-19 DIAGNOSIS — Z96653 Presence of artificial knee joint, bilateral: Secondary | ICD-10-CM | POA: Diagnosis present

## 2019-03-19 DIAGNOSIS — Z79899 Other long term (current) drug therapy: Secondary | ICD-10-CM

## 2019-03-19 DIAGNOSIS — E039 Hypothyroidism, unspecified: Secondary | ICD-10-CM

## 2019-03-19 DIAGNOSIS — E119 Type 2 diabetes mellitus without complications: Secondary | ICD-10-CM | POA: Diagnosis not present

## 2019-03-19 DIAGNOSIS — A0839 Other viral enteritis: Secondary | ICD-10-CM | POA: Diagnosis present

## 2019-03-19 DIAGNOSIS — Z794 Long term (current) use of insulin: Secondary | ICD-10-CM | POA: Diagnosis not present

## 2019-03-19 DIAGNOSIS — J1282 Pneumonia due to coronavirus disease 2019: Secondary | ICD-10-CM | POA: Diagnosis present

## 2019-03-19 DIAGNOSIS — B370 Candidal stomatitis: Secondary | ICD-10-CM | POA: Diagnosis present

## 2019-03-19 DIAGNOSIS — I472 Ventricular tachycardia: Secondary | ICD-10-CM | POA: Diagnosis present

## 2019-03-19 DIAGNOSIS — N1832 Chronic kidney disease, stage 3b: Secondary | ICD-10-CM | POA: Diagnosis not present

## 2019-03-19 DIAGNOSIS — I130 Hypertensive heart and chronic kidney disease with heart failure and stage 1 through stage 4 chronic kidney disease, or unspecified chronic kidney disease: Secondary | ICD-10-CM | POA: Diagnosis present

## 2019-03-19 DIAGNOSIS — J9602 Acute respiratory failure with hypercapnia: Secondary | ICD-10-CM | POA: Diagnosis present

## 2019-03-19 DIAGNOSIS — D61818 Other pancytopenia: Secondary | ICD-10-CM | POA: Diagnosis present

## 2019-03-19 DIAGNOSIS — Z79890 Hormone replacement therapy (postmenopausal): Secondary | ICD-10-CM

## 2019-03-19 DIAGNOSIS — D509 Iron deficiency anemia, unspecified: Secondary | ICD-10-CM

## 2019-03-19 DIAGNOSIS — E440 Moderate protein-calorie malnutrition: Secondary | ICD-10-CM | POA: Diagnosis present

## 2019-03-19 DIAGNOSIS — K219 Gastro-esophageal reflux disease without esophagitis: Secondary | ICD-10-CM | POA: Diagnosis present

## 2019-03-19 DIAGNOSIS — J9690 Respiratory failure, unspecified, unspecified whether with hypoxia or hypercapnia: Secondary | ICD-10-CM

## 2019-03-19 DIAGNOSIS — J80 Acute respiratory distress syndrome: Secondary | ICD-10-CM | POA: Diagnosis not present

## 2019-03-19 DIAGNOSIS — Z85850 Personal history of malignant neoplasm of thyroid: Secondary | ICD-10-CM

## 2019-03-19 DIAGNOSIS — C7889 Secondary malignant neoplasm of other digestive organs: Secondary | ICD-10-CM | POA: Diagnosis present

## 2019-03-19 DIAGNOSIS — D631 Anemia in chronic kidney disease: Secondary | ICD-10-CM | POA: Diagnosis present

## 2019-03-19 DIAGNOSIS — F419 Anxiety disorder, unspecified: Secondary | ICD-10-CM | POA: Diagnosis present

## 2019-03-19 DIAGNOSIS — R0602 Shortness of breath: Secondary | ICD-10-CM

## 2019-03-19 DIAGNOSIS — J984 Other disorders of lung: Secondary | ICD-10-CM

## 2019-03-19 DIAGNOSIS — Z87891 Personal history of nicotine dependence: Secondary | ICD-10-CM

## 2019-03-19 DIAGNOSIS — E1122 Type 2 diabetes mellitus with diabetic chronic kidney disease: Secondary | ICD-10-CM | POA: Diagnosis present

## 2019-03-19 DIAGNOSIS — N189 Chronic kidney disease, unspecified: Secondary | ICD-10-CM | POA: Diagnosis not present

## 2019-03-19 DIAGNOSIS — Z90411 Acquired partial absence of pancreas: Secondary | ICD-10-CM

## 2019-03-19 DIAGNOSIS — J9601 Acute respiratory failure with hypoxia: Secondary | ICD-10-CM | POA: Diagnosis present

## 2019-03-19 DIAGNOSIS — N179 Acute kidney failure, unspecified: Secondary | ICD-10-CM | POA: Diagnosis not present

## 2019-03-19 DIAGNOSIS — K7460 Unspecified cirrhosis of liver: Secondary | ICD-10-CM | POA: Diagnosis present

## 2019-03-19 DIAGNOSIS — N1831 Chronic kidney disease, stage 3a: Secondary | ICD-10-CM | POA: Diagnosis present

## 2019-03-19 DIAGNOSIS — E1121 Type 2 diabetes mellitus with diabetic nephropathy: Secondary | ICD-10-CM | POA: Diagnosis not present

## 2019-03-19 DIAGNOSIS — C649 Malignant neoplasm of unspecified kidney, except renal pelvis: Secondary | ICD-10-CM

## 2019-03-19 DIAGNOSIS — C641 Malignant neoplasm of right kidney, except renal pelvis: Secondary | ICD-10-CM

## 2019-03-19 DIAGNOSIS — I2699 Other pulmonary embolism without acute cor pulmonale: Secondary | ICD-10-CM

## 2019-03-19 DIAGNOSIS — Z8507 Personal history of malignant neoplasm of pancreas: Secondary | ICD-10-CM

## 2019-03-19 DIAGNOSIS — Z85038 Personal history of other malignant neoplasm of large intestine: Secondary | ICD-10-CM

## 2019-03-19 DIAGNOSIS — Z79891 Long term (current) use of opiate analgesic: Secondary | ICD-10-CM

## 2019-03-19 DIAGNOSIS — K717 Toxic liver disease with fibrosis and cirrhosis of liver: Secondary | ICD-10-CM | POA: Diagnosis not present

## 2019-03-19 DIAGNOSIS — Z6836 Body mass index (BMI) 36.0-36.9, adult: Secondary | ICD-10-CM

## 2019-03-19 LAB — HEMOGLOBIN A1C
Hgb A1c MFr Bld: 7.4 % — ABNORMAL HIGH (ref 4.8–5.6)
Mean Plasma Glucose: 165.68 mg/dL

## 2019-03-19 LAB — TROPONIN I (HIGH SENSITIVITY)
Troponin I (High Sensitivity): 29 ng/L — ABNORMAL HIGH (ref <18)
Troponin I (High Sensitivity): 29 ng/L — ABNORMAL HIGH (ref <18)
Troponin I (High Sensitivity): 27 ng/L — ABNORMAL HIGH (ref <18)
Troponin I (High Sensitivity): 25 ng/L — ABNORMAL HIGH (ref <18)

## 2019-03-19 LAB — COMPREHENSIVE METABOLIC PANEL
ALT: 32 U/L (ref 0–44)
AST: 71 U/L — ABNORMAL HIGH (ref 15–41)
Albumin: 3 g/dL — ABNORMAL LOW (ref 3.5–5.0)
Alkaline Phosphatase: 54 U/L (ref 38–126)
Anion gap: 14 (ref 5–15)
BUN: 46 mg/dL — ABNORMAL HIGH (ref 8–23)
CO2: 18 mmol/L — ABNORMAL LOW (ref 22–32)
Calcium: 7.6 mg/dL — ABNORMAL LOW (ref 8.9–10.3)
Chloride: 99 mmol/L (ref 98–111)
Creatinine, Ser: 1.65 mg/dL — ABNORMAL HIGH (ref 0.44–1.00)
GFR calc Af Amer: 35 mL/min — ABNORMAL LOW (ref >60)
GFR calc non Af Amer: 30 mL/min — ABNORMAL LOW (ref >60)
Glucose, Bld: 202 mg/dL — ABNORMAL HIGH (ref 70–99)
Potassium: 4.1 mmol/L (ref 3.5–5.1)
Sodium: 131 mmol/L — ABNORMAL LOW (ref 135–145)
Total Bilirubin: 0.8 mg/dL (ref 0.3–1.2)
Total Protein: 6.6 g/dL (ref 6.5–8.1)

## 2019-03-19 LAB — PROCALCITONIN: Procalcitonin: 0.38 ng/mL

## 2019-03-19 LAB — LACTIC ACID, PLASMA
Lactic Acid, Venous: 3.5 mmol/L (ref 0.5–1.9)
Lactic Acid, Venous: 2.9 mmol/L (ref 0.5–1.9)

## 2019-03-19 LAB — CBC WITH DIFFERENTIAL/PLATELET
Abs Immature Granulocytes: 0.12 10*3/uL — ABNORMAL HIGH (ref 0.00–0.07)
Basophils Absolute: 0 10*3/uL (ref 0.0–0.1)
Basophils Relative: 0 %
Eosinophils Absolute: 0 10*3/uL (ref 0.0–0.5)
Eosinophils Relative: 0 %
HCT: 31.2 % — ABNORMAL LOW (ref 36.0–46.0)
Hemoglobin: 10 g/dL — ABNORMAL LOW (ref 12.0–15.0)
Immature Granulocytes: 1 %
Lymphocytes Relative: 4 %
Lymphs Abs: 0.7 10*3/uL (ref 0.7–4.0)
MCH: 27.5 pg (ref 26.0–34.0)
MCHC: 32.1 g/dL (ref 30.0–36.0)
MCV: 85.7 fL (ref 80.0–100.0)
Monocytes Absolute: 0.5 10*3/uL (ref 0.1–1.0)
Monocytes Relative: 3 %
Neutro Abs: 14.1 10*3/uL — ABNORMAL HIGH (ref 1.7–7.7)
Neutrophils Relative %: 92 %
Platelets: 147 10*3/uL — ABNORMAL LOW (ref 150–400)
RBC: 3.64 MIL/uL — ABNORMAL LOW (ref 3.87–5.11)
RDW: 14.2 % (ref 11.5–15.5)
WBC: 15.4 10*3/uL — ABNORMAL HIGH (ref 4.0–10.5)
nRBC: 0 % (ref 0.0–0.2)

## 2019-03-19 LAB — URINALYSIS, COMPLETE (UACMP) WITH MICROSCOPIC
Bilirubin Urine: NEGATIVE
Glucose, UA: NEGATIVE mg/dL
Hgb urine dipstick: NEGATIVE
Ketones, ur: NEGATIVE mg/dL
Leukocytes,Ua: NEGATIVE
Nitrite: NEGATIVE
Protein, ur: 100 mg/dL — AB
Specific Gravity, Urine: 1.015 (ref 1.005–1.030)
pH: 5 (ref 5.0–8.0)

## 2019-03-19 LAB — LACTATE DEHYDROGENASE: LDH: 261 U/L — ABNORMAL HIGH (ref 98–192)

## 2019-03-19 LAB — C-REACTIVE PROTEIN: CRP: 9.3 mg/dL — ABNORMAL HIGH (ref <1.0)

## 2019-03-19 LAB — GLUCOSE, CAPILLARY
Glucose-Capillary: 197 mg/dL — ABNORMAL HIGH (ref 70–99)
Glucose-Capillary: 211 mg/dL — ABNORMAL HIGH (ref 70–99)

## 2019-03-19 LAB — BRAIN NATRIURETIC PEPTIDE: B Natriuretic Peptide: 139 pg/mL — ABNORMAL HIGH (ref 0.0–100.0)

## 2019-03-19 LAB — FIBRINOGEN: Fibrinogen: 513 mg/dL — ABNORMAL HIGH (ref 210–475)

## 2019-03-19 LAB — FERRITIN: Ferritin: 130 ng/mL (ref 11–307)

## 2019-03-19 LAB — TRIGLYCERIDES: Triglycerides: 105 mg/dL (ref <150)

## 2019-03-19 LAB — FIBRIN DERIVATIVES D-DIMER (ARMC ONLY): Fibrin derivatives D-dimer (ARMC): 1267.18 ng/mL (FEU) — ABNORMAL HIGH (ref 0.00–499.00)

## 2019-03-19 MED ORDER — IRBESARTAN 150 MG PO TABS: 150.0000 mg | ORAL_TABLET | Freq: Every day | ORAL | Status: DC

## 2019-03-19 MED ORDER — FLUTICASONE PROPIONATE 50 MCG/ACT NA SUSP
2.0000 | Freq: Every day | NASAL | Status: DC
  Administered 2019-03-21 – 2019-04-07 (×16): 2 via NASAL
  Filled 2019-03-19 (×4): qty 16

## 2019-03-19 MED ORDER — VITAMIN D 25 MCG (1000 UNIT) PO TABS
1000.0000 [IU] | ORAL_TABLET | Freq: Every day | ORAL | Status: DC
  Administered 2019-03-20 – 2019-04-06 (×18): 1000 [IU] via ORAL
  Filled 2019-03-19 (×19): qty 1

## 2019-03-19 MED ORDER — ENOXAPARIN SODIUM 40 MG/0.4ML ~~LOC~~ SOLN
40.0000 mg | SUBCUTANEOUS | Status: DC
  Administered 2019-03-19 – 2019-04-06 (×19): 40 mg via SUBCUTANEOUS
  Filled 2019-03-19 (×19): qty 0.4

## 2019-03-19 MED ORDER — VITAMIN B-12 1000 MCG PO TABS
1000.0000 ug | ORAL_TABLET | Freq: Every day | ORAL | Status: DC
  Administered 2019-03-20 – 2019-04-02 (×14): 1000 ug via ORAL
  Filled 2019-03-19 (×14): qty 1

## 2019-03-19 MED ORDER — IVERMECTIN 3 MG PO TABS
150.0000 ug/kg | ORAL_TABLET | Freq: Every day | ORAL | Status: DC
  Administered 2019-03-20 – 2019-03-24 (×5): 9000 ug via ORAL
  Filled 2019-03-19 (×7): qty 3

## 2019-03-19 MED ORDER — INSULIN DEGLUDEC 200 UNIT/ML ~~LOC~~ SOPN: 90.0000 [IU] | PEN_INJECTOR | Freq: Every day | SUBCUTANEOUS | Status: DC

## 2019-03-19 MED ORDER — SODIUM CHLORIDE 0.9 % IV SOLN
500.0000 mg | INTRAVENOUS | Status: DC
  Filled 2019-03-19: qty 500

## 2019-03-19 MED ORDER — PANTOPRAZOLE SODIUM 40 MG PO TBEC
40.0000 mg | DELAYED_RELEASE_TABLET | Freq: Every day | ORAL | Status: DC
  Administered 2019-03-20: 40 mg via ORAL
  Filled 2019-03-19: qty 1

## 2019-03-19 MED ORDER — ACETAMINOPHEN 325 MG PO TABS
650.0000 mg | ORAL_TABLET | Freq: Four times a day (QID) | ORAL | Status: DC | PRN
  Administered 2019-03-20: 650 mg via ORAL
  Administered 2019-04-01: 20:00:00 325 mg via ORAL
  Administered 2019-04-06: 650 mg via ORAL
  Filled 2019-03-19 (×3): qty 2

## 2019-03-19 MED ORDER — INSULIN ASPART 100 UNIT/ML ~~LOC~~ SOLN
0.0000 [IU] | SUBCUTANEOUS | Status: DC
  Administered 2019-03-19: 19:00:00 3 [IU] via SUBCUTANEOUS
  Administered 2019-03-19: 22:00:00 5 [IU] via SUBCUTANEOUS
  Filled 2019-03-19 (×3): qty 1

## 2019-03-19 MED ORDER — INSULIN GLARGINE 100 UNIT/ML ~~LOC~~ SOLN
40.0000 [IU] | Freq: Every day | SUBCUTANEOUS | Status: DC
  Administered 2019-03-19 – 2019-03-25 (×7): 40 [IU] via SUBCUTANEOUS
  Filled 2019-03-19 (×9): qty 0.4

## 2019-03-19 MED ORDER — SODIUM CHLORIDE 0.9 % IV BOLUS
1000.0000 mL | Freq: Once | INTRAVENOUS | Status: AC
  Administered 2019-03-19: 1000 mL via INTRAVENOUS

## 2019-03-19 MED ORDER — SODIUM CHLORIDE 0.9 % IV SOLN
2.0000 g | INTRAVENOUS | Status: DC
  Administered 2019-03-19: 2 g via INTRAVENOUS
  Filled 2019-03-19: qty 20

## 2019-03-19 MED ORDER — ONDANSETRON HCL 4 MG PO TABS
4.0000 mg | ORAL_TABLET | Freq: Four times a day (QID) | ORAL | Status: DC | PRN
  Filled 2019-03-19: qty 1

## 2019-03-19 MED ORDER — CITALOPRAM HYDROBROMIDE 20 MG PO TABS
40.0000 mg | ORAL_TABLET | Freq: Every day | ORAL | Status: DC
  Administered 2019-03-20 – 2019-04-07 (×19): 40 mg via ORAL
  Filled 2019-03-19 (×20): qty 2

## 2019-03-19 MED ORDER — NEBIVOLOL HCL 5 MG PO TABS: 5.0000 mg | ORAL_TABLET | Freq: Every day | ORAL | Status: DC

## 2019-03-19 MED ORDER — LEVOTHYROXINE SODIUM 50 MCG PO TABS
175.0000 ug | ORAL_TABLET | Freq: Every day | ORAL | Status: DC
  Administered 2019-03-20 – 2019-04-07 (×19): 175 ug via ORAL
  Filled 2019-03-19 (×6): qty 2
  Filled 2019-03-19: qty 1
  Filled 2019-03-19 (×2): qty 2
  Filled 2019-03-19: qty 1
  Filled 2019-03-19 (×3): qty 2
  Filled 2019-03-19 (×2): qty 1
  Filled 2019-03-19: qty 2
  Filled 2019-03-19 (×3): qty 1

## 2019-03-19 MED ORDER — INSULIN ASPART 100 UNIT/ML ~~LOC~~ SOLN: 0.0000 [IU] | Freq: Three times a day (TID) | SUBCUTANEOUS | Status: DC

## 2019-03-19 MED ORDER — CALCIUM ACETATE (PHOS BINDER) 667 MG PO CAPS
667.0000 mg | ORAL_CAPSULE | Freq: Two times a day (BID) | ORAL | Status: DC
  Administered 2019-03-19 – 2019-03-27 (×15): 667 mg via ORAL
  Filled 2019-03-19 (×17): qty 1

## 2019-03-19 MED ORDER — HYDROCODONE-ACETAMINOPHEN 10-325 MG PO TABS: 1.0000 | ORAL_TABLET | Freq: Two times a day (BID) | ORAL | Status: DC | PRN

## 2019-03-19 MED ORDER — DEXAMETHASONE SODIUM PHOSPHATE 10 MG/ML IJ SOLN
6.0000 mg | Freq: Every day | INTRAMUSCULAR | Status: DC
  Administered 2019-03-19 – 2019-03-20 (×2): 6 mg via INTRAVENOUS
  Filled 2019-03-19: qty 0.6
  Filled 2019-03-19: qty 1

## 2019-03-19 MED ORDER — AMLODIPINE BESYLATE 5 MG PO TABS: 5.0000 mg | ORAL_TABLET | Freq: Every day | ORAL | Status: DC

## 2019-03-19 MED ORDER — SODIUM CHLORIDE 0.9 % IV SOLN
200.0000 mg | Freq: Once | INTRAVENOUS | Status: AC
  Administered 2019-03-19: 200 mg via INTRAVENOUS
  Filled 2019-03-19: qty 200

## 2019-03-19 MED ORDER — ONDANSETRON HCL 4 MG/2ML IJ SOLN
4.0000 mg | Freq: Four times a day (QID) | INTRAMUSCULAR | Status: DC | PRN
  Administered 2019-03-22: 4 mg via INTRAVENOUS
  Filled 2019-03-19 (×2): qty 2

## 2019-03-19 MED ORDER — CLONIDINE HCL 0.1 MG PO TABS
0.1000 mg | ORAL_TABLET | Freq: Three times a day (TID) | ORAL | Status: DC
  Administered 2019-03-20 – 2019-03-23 (×9): 0.1 mg via ORAL
  Filled 2019-03-19 (×12): qty 1

## 2019-03-19 MED ORDER — AMLODIPINE-OLMESARTAN 5-20 MG PO TABS: 1.0000 | ORAL_TABLET | Freq: Every day | ORAL | Status: DC

## 2019-03-19 MED ORDER — SODIUM CHLORIDE 0.9 % IV SOLN
100.0000 mg | Freq: Every day | INTRAVENOUS | Status: AC
  Administered 2019-03-20 – 2019-03-23 (×4): 100 mg via INTRAVENOUS
  Filled 2019-03-19: qty 20
  Filled 2019-03-19 (×2): qty 100
  Filled 2019-03-19: qty 20
  Filled 2019-03-19: qty 100
  Filled 2019-03-19: qty 20

## 2019-03-19 MED ORDER — IVERMECTIN 3 MG PO TABS
200.0000 ug/kg | ORAL_TABLET | Freq: Every day | ORAL | Status: DC
  Filled 2019-03-19: qty 7

## 2019-03-19 NOTE — ED Triage Notes (Signed)
Patient presents to the ED with increased shortness of breath with a diagnosis of covid pneumonia from [facility].  Patient's oxygen level was 79% on room air at [facility] and came up to 91% on 2L [place].  Patient is also having diarrhea and incontinence.

## 2019-03-19 NOTE — Progress Notes (Signed)
Patient was new admission at shift change upon arrival patient was on 6L-[place]; Pt is now on 14L-[place] high flow, patient does not have any complaints and states that she does not feel SOB but sats are still in the low 90's and upper 80's at 14L-[place]; Provider notified at this time; Respiratory notified who stated to continue to titrate 02 on high flow and to change to non-rebreather sats doe not improve; Pt states that she cannot prone due to pancreatic ca hx with hernia to abdomen, patinet is lying on side at this time

## 2019-03-19 NOTE — ED Provider Notes (Signed)
[facility] Emergency Department Provider Note ____________________________________________   First MD Initiated Contact with Patient 03/19/19 1212     (approximate)  I have reviewed the triage vital signs and the nursing notes.   HISTORY  Chief Complaint Shortness of Breath   HPI Rebecca Kirby is a 74 y.o. female with PMH as noted below who presents for worsening shortness of breath and hypoxia after a recent diagnosis of Covid.  The patient has had an O2 saturation at home in the 70s.  She reports shortness of breath and fever but denies any acute pain.  The daughter reports that the patient was started on steroid by her PMD several days ago, and took a dose of prednisone today.  Past Medical History:  Diagnosis Date  . Anemia   . Anxiety   . Colon cancer ([place])   . Diabetes mellitus without complication ([place])   . Family history of adverse reaction to anesthesia    daughter had reaction to anesthesia and was in icu  . GERD (gastroesophageal reflux disease)   . Hypertension   . Hypothyroidism   . Kidney cancer, primary, with metastasis from kidney to other site [facility]) 08/26/2014   right  . Pancreas cancer ([place]) 2018  . Thyroid cancer [facility])     Patient Active Problem List   Diagnosis Date Noted  . Pneumonia due to COVID-19 virus 03/19/2019  . Lung nodule, solitary 07/03/2016  . Cancer of right kidney ([place]) 11/03/2015  . Iron deficiency anemia due to chronic blood loss 09/02/2015  . Cirrhosis ([place]) 09/02/2015  . 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 Malignant neoplasm of colon ([place]) 03/16/2015  . Diabetes mellitus ([place]) 03/16/2015  . Broken ulna 03/16/2015  . Personal history of healed traumatic fracture 03/16/2015  . BP (high blood pressure) 03/16/2015  . Impaired renal function 03/16/2015  . Avitaminosis D 03/16/2015  . Lump in thyroid 01/05/2015  . 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  Artificial knee joint present 08/09/2014  . Anemia in chronic illness 02/04/2014    Past Surgical History:  Procedure Laterality Date  . CHOLECYSTECTOMY    . COLON RESECTION    . COLONOSCOPY WITH PROPOFOL N/A 09/01/2015   Procedure: COLONOSCOPY WITH PROPOFOL;  Surgeon: Manya Silvas, MD;  Location: [facility] ENDOSCOPY;  Service: Endoscopy;  Laterality: N/A;  . CYSTOSCOPY  12/28/2016   Procedure: CYSTOSCOPY;  Surgeon: Ward, Honor Loh, MD;  Location: [facility] ORS;  Service: Gynecology;;  . ESOPHAGOGASTRODUODENOSCOPY (EGD) WITH PROPOFOL N/A 09/01/2015   Procedure: ESOPHAGOGASTRODUODENOSCOPY (EGD) WITH PROPOFOL;  Surgeon: Manya Silvas, MD;  Location: [facility] ENDOSCOPY;  Service: Endoscopy;  Laterality: N/A;  . EUS N/A 06/28/2016   Procedure: FULL UPPER ENDOSCOPIC ULTRASOUND (EUS) RADIAL;  Surgeon: Jola Schmidt, MD;  Location: [facility] ENDOSCOPY;  Service: Endoscopy;  Laterality: N/A;  . EXCISION MASS NECK N/A 09/20/2015   Procedure: EXCISION MASS NECK;  Surgeon: Beverly Gust, MD;  Location: [place];  Service: ENT;  Laterality: N/A;  . LAPAROSCOPIC BILATERAL SALPINGO OOPHERECTOMY Bilateral 12/28/2016   Procedure: LAPAROSCOPIC BILATERAL SALPINGO OOPHORECTOMY;  Surgeon: Ward, Honor Loh, MD;  Location: [facility] ORS;  Service: Gynecology;  Laterality: Bilateral;  . LAPAROSCOPIC HYSTERECTOMY N/A 12/28/2016   Procedure: HYSTERECTOMY TOTAL LAPAROSCOPIC;  Surgeon: Ward, Honor Loh, MD;  Location: [facility] ORS;  Service: Gynecology;  Laterality: N/A;  . LAPAROSCOPIC LYSIS OF ADHESIONS  12/28/2016   Procedure: LAPAROSCOPIC EXTENSIVE LYSIS OF ADHESIONS;  Surgeon: Leonides Schanz, Vikki Ports  C, MD;  Location: [facility] ORS;  Service: Gynecology;;  . rt kidney removed Right   . THYROGLOSSAL DUCT CYST Right 09/20/2015   Procedure: excision sub mandibular gland right;  Surgeon: Beverly Gust, MD;  Location: [place];  Service: ENT;  Laterality: Right;  . THYROID SURGERY Bilateral   . TOTAL KNEE ARTHROPLASTY Right   . TOTAL KNEE  ARTHROPLASTY Left 08/09/2014   Procedure: TOTAL KNEE ARTHROPLASTY;  Surgeon: Dereck Leep, MD;  Location: [facility] ORS;  Service: Orthopedics;  Laterality: Left;    Prior to Admission medications   Medication Sig Start Date End Date Taking? Authorizing Provider  acetaminophen (TYLENOL) 500 MG tablet Take 500-1,000 mg by mouth 2 (two) times daily.   Yes [provider]  amLODipine-olmesartan (AZOR) 5-20 MG per tablet Take 1 tablet by mouth daily.   Yes [provider]  calcium acetate (PHOSLO) 667 MG capsule Take 667 mg by mouth 2 (two) times daily.    Yes [provider]  Cholecalciferol (VITAMIN D-3) 1000 UNITS CAPS Take 1,000 Units by mouth daily.    Yes [provider]  citalopram (CELEXA) 40 MG tablet Take 40 mg by mouth daily. 01/07/19  Yes [provider]  fexofenadine (ALLEGRA) 180 MG tablet Take 180 mg by mouth daily as needed for allergies.    Yes [provider]  fluticasone (FLONASE) 50 MCG/ACT nasal spray Place 2 sprays into both nostrils daily.   Yes [provider]  HUMALOG KWIKPEN 100 UNIT/ML KiwkPen Inject 18-34 Units into the skin 3 (three) times daily. Per sliding scale 09/04/16  Yes [provider]  HYDROcodone-acetaminophen (NORCO) 10-325 MG tablet Take 1 tablet by mouth 2 (two) times daily as needed for pain. 03/06/19  Yes [provider]  levothyroxine (SYNTHROID, LEVOTHROID) 175 MCG tablet Take 175 mcg by mouth daily before breakfast.   Yes [provider]  metFORMIN (GLUCOPHAGE-XR) 500 MG 24 hr tablet Take 500 mg by mouth 2 (two) times daily. 02/22/19  Yes [provider]  nebivolol (BYSTOLIC) 5 MG tablet Take 5 mg by mouth daily.   Yes [provider]  ondansetron (ZOFRAN) 4 MG tablet Take 4 mg by mouth every 8 (eight) hours as needed for nausea. 03/17/19 03/24/19 Yes [provider]  pantoprazole (PROTONIX) 40 MG tablet Take 40 mg by mouth daily.   Yes [provider]  predniSONE (DELTASONE) 10 MG tablet Take 30 mg by mouth daily. 03/17/19 03/23/19 Yes [provider]  TRESIBA FLEXTOUCH 200 UNIT/ML SOPN Inject 90 Units into the skin every evening. 02/16/19  Yes [provider]  vitamin B-12 (CYANOCOBALAMIN) 1000 MCG tablet Take 1,000 mcg by mouth daily. Reported on 07/07/2015   Yes [provider]  azithromycin (ZITHROMAX) 250 MG tablet Take 250-500 mg by mouth daily. 03/11/19   [provider]    Allergies Statins  Family History  Problem Relation Age of Onset  . Breast cancer Maternal Aunt   . Breast cancer Cousin     Social History Social History   Tobacco Use  . Smoking status: Former Smoker    Quit date: 07/29/1994    Years since quitting: 24.6  . Smokeless tobacco: Never Used  Substance Use Topics  . Alcohol use: No  . Drug use: No    Review of Systems  Constitutional: Positive for fever. Eyes: No redness. ENT: No sore throat. Cardiovascular: Denies chest pain. Respiratory: Positive for shortness of breath. Gastrointestinal: No vomiting or diarrhea.  Genitourinary: Negative for  flank pain Musculoskeletal: Negative for back pain. Skin: Negative for rash. Neurological: Negative for headache.   ____________________________________________   PHYSICAL EXAM:  VITAL SIGNS: ED Triage Vitals [03/19/19 1151]  Enc Vitals Group     BP (!) 120/37     Pulse Rate 73     Resp 18     Temp 99.4 F (37.4 C)     Temp Source Oral     SpO2 (!) 85 %     Weight      Height      Head Circumference      Peak Flow      Pain Score      Pain Loc      Pain Edu?      Excl. in [place]?     Constitutional: Alert and oriented.  Weak appearing but in no acute distress. Eyes: Conjunctivae are normal.  Head: Atraumatic. Nose: No congestion/rhinnorhea. Mouth/Throat: Mucous membranes are moist.   Neck: Normal range of motion.  Cardiovascular: Normal rate, regular rhythm.  Good peripheral  circulation. Respiratory: Tachypneic with increased respiratory effort.  No retractions.  Gastrointestinal: No distention.  Musculoskeletal:  Extremities warm and well perfused.  Neurologic:  Normal speech and language. No gross focal neurologic deficits are appreciated.  Skin:  Skin is warm and dry. No rash noted. Psychiatric: Mood and affect are normal. Speech and behavior are normal.  ____________________________________________   LABS (all labs ordered are listed, but only abnormal results are displayed)  Labs Reviewed  LACTIC ACID, PLASMA - Abnormal; Notable for the following components:      Result Value   Lactic Acid, Venous 3.5 (*)    All other components within normal limits  COMPREHENSIVE METABOLIC PANEL - Abnormal; Notable for the following components:   Sodium 131 (*)    CO2 18 (*)    Glucose, Bld 202 (*)    BUN 46 (*)    Creatinine, Ser 1.65 (*)    Calcium 7.6 (*)    Albumin 3.0 (*)    AST 71 (*)    GFR calc non Af Amer 30 (*)    GFR calc Af Amer 35 (*)    All other components within normal limits  CBC WITH DIFFERENTIAL/PLATELET - Abnormal; Notable for the following components:   WBC 15.4 (*)    RBC 3.64 (*)    Hemoglobin 10.0 (*)    HCT 31.2 (*)    Platelets 147 (*)    Neutro Abs 14.1 (*)    Abs Immature Granulocytes 0.12 (*)    All other components within normal limits  URINALYSIS, COMPLETE (UACMP) WITH MICROSCOPIC - Abnormal; Notable for the following components:   Color, Urine YELLOW (*)    APPearance CLEAR (*)    Protein, ur 100 (*)    Bacteria, UA RARE (*)    All other components within normal limits  BRAIN NATRIURETIC PEPTIDE - Abnormal; Notable for the following components:   B Natriuretic Peptide 139.0 (*)    All other components within normal limits  FIBRINOGEN - Abnormal; Notable for the following components:   Fibrinogen 513 (*)    All other components within normal limits  LACTATE DEHYDROGENASE - Abnormal; Notable for the following  components:   LDH 261 (*)    All other components within normal limits  FIBRIN DERIVATIVES D-DIMER ([facility] ONLY) - Abnormal; Notable for the following components:   Fibrin derivatives D-dimer ([facility]) 1,267.18 (*)    All other components within normal limits  TROPONIN I (HIGH  SENSITIVITY) - Abnormal; Notable for the following components:   Troponin I (High Sensitivity) 29 (*)    All other components within normal limits  CULTURE, BLOOD (ROUTINE X 2)  CULTURE, BLOOD (ROUTINE X 2)  TRIGLYCERIDES  FERRITIN  PROCALCITONIN  LACTIC ACID, PLASMA  C-REACTIVE PROTEIN  TROPONIN I (HIGH SENSITIVITY)   ____________________________________________  EKG  ED ECG REPORT I, Arta Silence, the attending physician, personally viewed and interpreted this ECG.  Date: 03/19/2019 EKG Time: 1210 Rate: 71 Rhythm: normal sinus rhythm QRS Axis: normal Intervals: Nonspecific IVCD ST/T Wave abnormalities: normal Narrative Interpretation: no evidence of acute ischemia  ____________________________________________  RADIOLOGY  CXR: Bilateral multilobar infiltrates  ____________________________________________   PROCEDURES  Procedure(s) performed: No  Procedures  Critical Care performed: Yes  CRITICAL CARE Performed by: Arta Silence   Total critical care time: 35 minutes  Critical care time was exclusive of separately billable procedures and treating other patients.  Critical care was necessary to treat or prevent imminent or life-threatening deterioration.  Critical care was time spent personally by me on the following activities: development of treatment plan with patient and/or surrogate as well as nursing, discussions with consultants, evaluation of patient's response to treatment, examination of patient, obtaining history from patient or surrogate, ordering and performing treatments and interventions, ordering and review of laboratory studies, ordering and review of  radiographic studies, pulse oximetry and re-evaluation of patient's condition. ____________________________________________   INITIAL IMPRESSION / ASSESSMENT AND PLAN / ED COURSE  Pertinent labs & imaging results that were available during my care of the patient were reviewed by me and considered in my medical decision making (see chart for details).  74 year old female with PMH as noted above presents with worsening shortness of breath and hypoxia in the context of COVID-19.  The daughter reports that the patient was diagnosed with COVID-19 last week.  Over the last few days her O2 saturation has dropped to as low as the 70s this morning.  The patient has been taking prednisone for the last several days including today.  On exam, the patient is alert and in no acute distress but does have tachypnea and increased work of breathing.  O2 saturation is in the low 90s on 5 L by nasal cannula.  The remainder of the exam is as described above.  Chest x-ray confirms multilobar pneumonia consistent with COVID-19.  We will obtain lab work-up and plan for admission due to the hypoxia.  I will order remdesivir but not dexamethasone as the patient already received prednisone today.  ----------------------------------------- 3:12 PM on 03/19/2019 -----------------------------------------  I discussed the patient with the hospitalist for admission.  Since the patient has an elevated WBC count and lactic acid, I ordered antibiotics to cover for the possibility of superimposed bacterial pneumonia as well. ____________________________  Lavone Neri was evaluated in Emergency Department on 03/19/2019 for the symptoms described in the history of present illness. She was evaluated in the context of the global COVID-19 pandemic, which necessitated consideration that the patient might be at risk for infection with the SARS-CoV-2 virus that causes COVID-19. Institutional protocols and algorithms that pertain to the  evaluation of patients at risk for COVID-19 are in a state of rapid change based on information released by regulatory bodies including the CDC and federal and state organizations. These policies and algorithms were followed during the patient's care in the ED. ____________________________________________   FINAL CLINICAL IMPRESSION(S) / ED DIAGNOSES  Final diagnoses:  Acute respiratory failure with hypoxia ([place])  COVID-19  NEW MEDICATIONS STARTED DURING THIS VISIT:  New Prescriptions   No medications on file     Note:  This document was prepared using Dragon voice recognition software and may include unintentional dictation errors.    Arta Silence, MD 03/19/19 1513

## 2019-03-19 NOTE — Consult Note (Signed)
Pulmonary Medicine          Date: 03/19/2019,   MRN# [id number] Rebecca Kirby 10/04/1945     Admission                  Current   Referring physician: Dr. Mal Misty    CHIEF COMPLAINT:   Acute hypoxemic respiratory failure   HISTORY OF PRESENT ILLNESS   This is a very pleasant 74 year old female with a history of chronic anemia, anxiety disorder, history of colon CA, GERD, hypothyroidism renal cell carcinoma history of thyroid CA, pancreatic CA status post Whipple's procedure, diabetes, came into the ED due to worsening hypoxemia post COVID-19 infection.  Patient has been positive for COVID-19 since March 11, 2019 has initially been relatively minimally symptomatic with prednisone taper and Zithromax prescribed by primary care doctor on the 15th.  Over the last 48 hours patient had also developed fevers and loose stools T-max at 101.9 and again had similar fever and diarrhea today.  Her oxygen saturation started to get worse noted to be at approximately 78% on room air.  Patient generally does not like to go to the hospital and has initially declined to come in however her daughter was insisting that she comes in and get medical attention.  In the ED she was noted to be 85% on 2 L which improved with increased supplemental oxygen satting at approximately 90% on 5 to 6 L/min nasal cannula.  Chest x-ray was reviewed independently by me which is consistent with multilobar bilateral airspace opacification consistent with COVID-19 induced pneumonia.  In the ED she received her first dose of remdesivir as well as resuscitative IV fluids.  During my examination patient does not appear to be in distress he is nontachycardic with mild tachypnea respiratory rate ~25.  She had high-sensitivity troponins done today with a mild elevation at 29 and trending down to 27 with repeat.  Blood sugars have been somewhat elevated with most recent at 197.  Procalcitonin is mildly elevated 0.38 as well as  mild elevation BNP at 139.   PAST MEDICAL HISTORY   Past Medical History:  Diagnosis Date  . Anemia   . Anxiety   . Colon cancer ([place])   . Diabetes mellitus without complication ([place])   . Family history of adverse reaction to anesthesia    daughter had reaction to anesthesia and was in icu  . GERD (gastroesophageal reflux disease)   . Hypertension   . Hypothyroidism   . Kidney cancer, primary, with metastasis from kidney to other site [facility]) 08/26/2014   right  . Pancreas cancer ([place]) 2018  . Thyroid cancer [facility])      SURGICAL HISTORY   Past Surgical History:  Procedure Laterality Date  . CHOLECYSTECTOMY    . COLON RESECTION    . COLONOSCOPY WITH PROPOFOL N/A 09/01/2015   Procedure: COLONOSCOPY WITH PROPOFOL;  Surgeon: Manya Silvas, MD;  Location: [facility] ENDOSCOPY;  Service: Endoscopy;  Laterality: N/A;  . CYSTOSCOPY  12/28/2016   Procedure: CYSTOSCOPY;  Surgeon: Ward, Honor Loh, MD;  Location: [facility] ORS;  Service: Gynecology;;  . ESOPHAGOGASTRODUODENOSCOPY (EGD) WITH PROPOFOL N/A 09/01/2015   Procedure: ESOPHAGOGASTRODUODENOSCOPY (EGD) WITH PROPOFOL;  Surgeon: Manya Silvas, MD;  Location: [facility] ENDOSCOPY;  Service: Endoscopy;  Laterality: N/A;  . EUS N/A 06/28/2016   Procedure: FULL UPPER ENDOSCOPIC ULTRASOUND (EUS) RADIAL;  Surgeon: Jola Schmidt, MD;  Location: [facility] ENDOSCOPY;  Service: Endoscopy;  Laterality: N/A;  . EXCISION MASS NECK N/A  09/20/2015   Procedure: EXCISION MASS NECK;  Surgeon: Beverly Gust, MD;  Location: [place];  Service: ENT;  Laterality: N/A;  . LAPAROSCOPIC BILATERAL SALPINGO OOPHERECTOMY Bilateral 12/28/2016   Procedure: LAPAROSCOPIC BILATERAL SALPINGO OOPHORECTOMY;  Surgeon: Ward, Honor Loh, MD;  Location: [facility] ORS;  Service: Gynecology;  Laterality: Bilateral;  . LAPAROSCOPIC HYSTERECTOMY N/A 12/28/2016   Procedure: HYSTERECTOMY TOTAL LAPAROSCOPIC;  Surgeon: Ward, Honor Loh, MD;  Location: [facility] ORS;  Service: Gynecology;  Laterality: N/A;  .  LAPAROSCOPIC LYSIS OF ADHESIONS  12/28/2016   Procedure: LAPAROSCOPIC EXTENSIVE LYSIS OF ADHESIONS;  Surgeon: Ward, Honor Loh, MD;  Location: [facility] ORS;  Service: Gynecology;;  . rt kidney removed Right   . THYROGLOSSAL DUCT CYST Right 09/20/2015   Procedure: excision sub mandibular gland right;  Surgeon: Beverly Gust, MD;  Location: [place];  Service: ENT;  Laterality: Right;  . THYROID SURGERY Bilateral   . TOTAL KNEE ARTHROPLASTY Right   . TOTAL KNEE ARTHROPLASTY Left 08/09/2014   Procedure: TOTAL KNEE ARTHROPLASTY;  Surgeon: Dereck Leep, MD;  Location: [facility] ORS;  Service: Orthopedics;  Laterality: Left;     FAMILY HISTORY   Family History  Problem Relation Age of Onset  . Breast cancer Maternal Aunt   . Breast cancer Cousin      SOCIAL HISTORY   Social History   Tobacco Use  . Smoking status: Former Smoker    Quit date: 07/29/1994    Years since quitting: 24.6  . Smokeless tobacco: Never Used  Substance Use Topics  . Alcohol use: No  . Drug use: No     MEDICATIONS    Home Medication:    Current Medication:  Current Facility-Administered Medications:  .  acetaminophen (TYLENOL) tablet 650 mg, 650 mg, Oral, Q6H PRN, Jennye Boroughs, MD .  Derrill Memo ON 03/20/2019] amLODipine (NORVASC) tablet 5 mg, 5 mg, Oral, Daily, Nazari, Walid A, RPH .  calcium acetate (PHOSLO) capsule 667 mg, 667 mg, Oral, BID, Jennye Boroughs, MD .  Derrill Memo ON 03/20/2019] cholecalciferol (VITAMIN D3) tablet 1,000 Units, 1,000 Units, Oral, Daily, Jennye Boroughs, MD .  Derrill Memo ON 03/20/2019] citalopram (CELEXA) tablet 40 mg, 40 mg, Oral, Daily, Jennye Boroughs, MD .  dexamethasone (DECADRON) injection 6 mg, 6 mg, Intravenous, Daily, Jennye Boroughs, MD .  enoxaparin (LOVENOX) injection 40 mg, 40 mg, Subcutaneous, Q12H, Jennye Boroughs, MD .  Derrill Memo ON 03/20/2019] fluticasone (FLONASE) 50 MCG/ACT nasal spray 2 spray, 2 spray, Each Nare, Daily, Jennye Boroughs, MD .  HYDROcodone-acetaminophen  (NORCO) 10-325 MG per tablet 1 tablet, 1 tablet, Oral, BID PRN, Jennye Boroughs, MD .  insulin aspart (novoLOG) injection 0-15 Units, 0-15 Units, Subcutaneous, TID WC, Jennye Boroughs, MD .  Insulin Degludec SOPN 90 Units, 90 Units, Subcutaneous, QHS, Jennye Boroughs, MD .  Derrill Memo ON 03/20/2019] irbesartan (AVAPRO) tablet 150 mg, 150 mg, Oral, Daily, Rito Ehrlich A, RPH .  [START ON 03/20/2019] levothyroxine (SYNTHROID) tablet 175 mcg, 175 mcg, Oral, Q0600, Jennye Boroughs, MD .  Derrill Memo ON 03/20/2019] nebivolol (BYSTOLIC) tablet 5 mg, 5 mg, Oral, Daily, Jennye Boroughs, MD .  ondansetron (ZOFRAN) tablet 4 mg, 4 mg, Oral, Q6H PRN **OR** ondansetron (ZOFRAN) injection 4 mg, 4 mg, Intravenous, Q6H PRN, Jennye Boroughs, MD .  Derrill Memo ON 03/20/2019] pantoprazole (PROTONIX) EC tablet 40 mg, 40 mg, Oral, Daily, Jennye Boroughs, MD .  Margrett Rud remdesivir 200 mg in sodium chloride 0.9% 250 mL IVPB, 200 mg, Intravenous, Once, Stopped at 03/19/19 1345 **FOLLOWED BY** [START ON 03/20/2019] remdesivir 100 mg  in sodium chloride 0.9 % 100 mL IVPB, 100 mg, Intravenous, Daily, Jennye Boroughs, MD .  Derrill Memo ON 03/20/2019] vitamin B-12 (CYANOCOBALAMIN) tablet 1,000 mcg, 1,000 mcg, Oral, Daily, Jennye Boroughs, MD    ALLERGIES   Statins     REVIEW OF SYSTEMS    Review of Systems:  Gen:  Denies  fever, sweats, chills weigh loss  HEENT: Denies blurred vision, double vision, ear pain, eye pain, hearing loss, nose bleeds, sore throat Cardiac:  No dizziness, chest pain or heaviness, chest tightness,edema Resp:   Admits to shortness of breath, denies wheezing, hemoptysis,  Gi: Denies swallowing difficulty, stomach pain, nausea or vomiting, diarrhea, constipation, bowel incontinence Gu:  Denies bladder incontinence, burning urine Ext:   Denies Joint pain, stiffness or swelling Skin: Denies  skin rash, easy bruising or bleeding or hives Endoc:  Denies polyuria, polydipsia , polyphagia or weight change Psych:   Denies  depression, insomnia or hallucinations   Other:  All other systems negative   VS: BP (!) 133/51   Pulse 66   Temp 99.4 F (37.4 C) (Oral)   Resp (!) 27   SpO2 92%      PHYSICAL EXAM    GENERAL:NAD, no fevers, chills, no weakness no fatigue HEAD: Normocephalic, atraumatic.  EYES: Pupils equal, round, reactive to light. Extraocular muscles intact. No scleral icterus.  MOUTH: Moist mucosal membrane. Dentition intact. No abscess noted.  EAR, NOSE, THROAT: Clear without exudates. No external lesions.  NECK: Supple. No thyromegaly. No nodules. No JVD.  PULMONARY: Mildly rhonchorous breath sounds bilaterally CARDIOVASCULAR: S1 and S2. Regular rate and rhythm. No murmurs, rubs, or gallops. No edema. Pedal pulses 2+ bilaterally.  GASTROINTESTINAL: Soft, nontender, nondistended. No masses. Positive bowel sounds. No hepatosplenomegaly.  MUSCULOSKELETAL: No swelling, clubbing, or edema. Range of motion full in all extremities.  NEUROLOGIC: Cranial nerves II through XII are intact. No gross focal neurological deficits. Sensation intact. Reflexes intact.  SKIN: No ulceration, lesions, rashes, or cyanosis. Skin warm and dry. Turgor intact.  PSYCHIATRIC: Mood, affect within normal limits. The patient is awake, alert and oriented x 3. Insight, judgment intact.       IMAGING    DG Chest 2 View  Result Date: 03/19/2019 CLINICAL DATA:  74 year old female with shortness of breath. Recent diagnosis of COVID-19. EXAM: CHEST - 2 VIEW COMPARISON:  Chest x-ray 01/05/2011. FINDINGS: Patchy asymmetrically distributed opacities and areas of interstitial prominence throughout the lungs bilaterally (left greater than right), concerning for multilobar pneumonia bilateral. No pleural effusions. No pneumothorax. No evidence of pulmonary edema. Heart size is normal. Upper mediastinal contours are within normal limits. IMPRESSION: 1. Severe multilobar bilateral pneumonia, as above. Electronically Signed   By:  Vinnie Langton M.D.   On: 03/19/2019 11:56   MM 3D SCREEN BREAST BILATERAL  Result Date: 03/10/2019 CLINICAL DATA:  Screening. EXAM: DIGITAL SCREENING BILATERAL MAMMOGRAM WITH TOMO AND CAD COMPARISON:  Previous exam(s). ACR Breast Density Category c: The breast tissue is heterogeneously dense, which may obscure small masses. FINDINGS: In the left breast, calcifications with associated asymmetry warrants further evaluation. In the right breast, no findings suspicious for malignancy. Images were processed with CAD. IMPRESSION: Further evaluation is suggested for calcifications in the left breast. RECOMMENDATION: Diagnostic mammogram and possible ultrasound of the left breast. (Code:[id number]-L-39M) The patient will be contacted regarding the findings, and additional imaging will be scheduled. BI-RADS CATEGORY  0: Incomplete. Need additional imaging evaluation and/or prior mammograms for comparison. Electronically Signed   By: Dedra Skeens.D.  On: 03/10/2019 11:42      ASSESSMENT/PLAN   Acute hypoxemic respiratory failure  - due to COVID19 induced bilateral multifocal pneumonia -patient is diabetic sensitive to steroids - will require frequent checks - Decadron 6mg  IV daily  - Remdesevir load and mainatance x 5d - bronchopulmonary hygiene with xopenex and incentive spirometer if patient can participate  -Ivermectin 200 mcg/kg p.o. daily - PT/OT - ok to participate with spO2>80% -concern for possible PE , will obtain BNP, TTE, hsTrop, holding contrasted CT chest for now, will order USLE -daily hepatic panel or CMP -appreciate hospitalist team collaboration   Multiple comorbid conditions -Hx of RCC, Pancreatic CA, thyroid CA, colon CA -hx of liver cirrosis and CKD - predisposing to hypercoagulable state - DM - monitoring sugars closely q4h with ISS q4h  -hypothryroidism - home synthroid -HTN - I will stop norvasc, bystolic and ibersartan to help decrase hepatoxicity while on remdesevir and  ivermectin, in the interim will use low dose clonidine TID to tx HTN and help patients anxious state simultaneously     Thank you for allowing me to participate in the care of this patient.    Patient/Family are satisfied with care plan and all questions have been answered.   This document was prepared using Dragon voice recognition software and may include unintentional dictation errors.     Ottie Glazier, M.D.  Division of [place]

## 2019-03-19 NOTE — ED Notes (Signed)
Patient ambulatory to [facility] on 6L [place]. On returning to bed, patient 81%. Patient denies dyspnea, but appears SOB with tachypnea and more labored breathing. Patient recovers well on O2 with rest.

## 2019-03-19 NOTE — Consult Note (Signed)
Remdesivir - Pharmacy Brief Note   O:  ALT: pending CXR: Severe multilobar bilateral pneumonia SpO2: 85% on 2L   A/P:  Remdesivir 200 mg IVPB once followed by 100 mg IVPB daily x 4 days.   Pearla Dubonnet, PharmD Clinical Pharmacist 03/19/2019 12:44 PM

## 2019-03-19 NOTE — H&P (Addendum)
History and Physical:    Rebecca Kirby   [id number] DOB: Aug 23, 1945 DOA: 03/19/2019  Referring MD/provider: Dr. Arta Silence PCP: Idelle Crouch, MD   Patient coming from: Home  Chief Complaint: Shortness of breath and decreased oxygen saturation  History of Present Illness:   Rebecca Kirby is an 74 y.o. 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  She was referred from the doctor's office to the hospital today because of shortness of breath and decreased oxygen saturation.  Butch Penny, her daughter was at the bedside and she helped with the history.  She said patient tested positive for coronavirus on Wednesday, March 11, 2019 but she was asymptomatic.  She was seen at [facility] clinic on 03/13/2019 and was prescribed prednisone taper and azithromycin.  Patient developed diarrhea and fever yesterday.  Apparently her temperature was 101.9.  She had another fever today with temperature of 102 and she had 3 loose stools this morning.  Oxygen saturation dropped to 78% on room air.  Initially patient was reluctant to go to the hospital for evaluation but she decided that she will go to the doctor's office for further management.  No abdominal pain, vomiting, cough, chest pain.  ED Course:  The patient was hypoxemic in the ED with temperature of oxygen saturation of 85% on 2 L.  She was placed on 5 L of oxygen.  According to her daughter, she desaturated to 88% on 5 L when she turned her in bed.  Chest x-ray showed severe multilobar bilateral pneumonia.  She was started on IV remdesivir and 1 L of normal saline in the ED.  ROS:   ROS all other systems reviewed were negative  Past Medical History:   Past Medical History:  Diagnosis Date  .  Anemia   . Anxiety   . Colon cancer ([place])   . Diabetes mellitus without complication ([place])   . Family history of adverse reaction to anesthesia    daughter had reaction to anesthesia and was in icu  . GERD (gastroesophageal reflux disease)   . Hypertension   . Hypothyroidism   . Kidney cancer, primary, with metastasis from kidney to other site [facility]) 08/26/2014   right  . Pancreas cancer ([place]) 2018  . Thyroid cancer [facility])     Past Surgical History:   Past Surgical History:  Procedure Laterality Date  . CHOLECYSTECTOMY    . COLON RESECTION    . COLONOSCOPY WITH PROPOFOL N/A 09/01/2015   Procedure: COLONOSCOPY WITH PROPOFOL;  Surgeon: Manya Silvas, MD;  Location: [facility] ENDOSCOPY;  Service: Endoscopy;  Laterality: N/A;  . CYSTOSCOPY  12/28/2016   Procedure: CYSTOSCOPY;  Surgeon: Ward, Honor Loh, MD;  Location: [facility] ORS;  Service: Gynecology;;  . ESOPHAGOGASTRODUODENOSCOPY (EGD) WITH PROPOFOL N/A 09/01/2015   Procedure: ESOPHAGOGASTRODUODENOSCOPY (EGD) WITH PROPOFOL;  Surgeon: Manya Silvas, MD;  Location: [facility] ENDOSCOPY;  Service: Endoscopy;  Laterality: N/A;  . EUS N/A 06/28/2016   Procedure: FULL UPPER ENDOSCOPIC ULTRASOUND (EUS) RADIAL;  Surgeon: Jola Schmidt, MD;  Location: [facility] ENDOSCOPY;  Service: Endoscopy;  Laterality: N/A;  . EXCISION MASS NECK N/A 09/20/2015   Procedure: EXCISION MASS NECK;  Surgeon: Beverly Gust, MD;  Location: [place];  Service: ENT;  Laterality: N/A;  . LAPAROSCOPIC BILATERAL SALPINGO OOPHERECTOMY Bilateral 12/28/2016   Procedure: LAPAROSCOPIC BILATERAL SALPINGO OOPHORECTOMY;  Surgeon: Ward, Honor Loh, MD;  Location: [facility] ORS;  Service: Gynecology;  Laterality: Bilateral;  . LAPAROSCOPIC HYSTERECTOMY N/A 12/28/2016   Procedure: HYSTERECTOMY TOTAL LAPAROSCOPIC;  Surgeon: Ward, Honor Loh, MD;  Location: [facility] ORS;  Service: Gynecology;  Laterality: N/A;  . LAPAROSCOPIC LYSIS OF ADHESIONS  12/28/2016   Procedure: LAPAROSCOPIC EXTENSIVE LYSIS OF  ADHESIONS;  Surgeon: Ward, Honor Loh, MD;  Location: [facility] ORS;  Service: Gynecology;;  . rt kidney removed Right   . THYROGLOSSAL DUCT CYST Right 09/20/2015   Procedure: excision sub mandibular gland right;  Surgeon: Beverly Gust, MD;  Location: [place];  Service: ENT;  Laterality: Right;  . THYROID SURGERY Bilateral   . TOTAL KNEE ARTHROPLASTY Right   . TOTAL KNEE ARTHROPLASTY Left 08/09/2014   Procedure: TOTAL KNEE ARTHROPLASTY;  Surgeon: Dereck Leep, MD;  Location: [facility] ORS;  Service: Orthopedics;  Laterality: Left;    Social History:   Social History   Socioeconomic History  . Marital status: Widowed    Spouse name: Not on file  . Number of children: Not on file  . Years of education: Not on file  . Highest education level: Not on file  Occupational History  . Not on file  Tobacco Use  . Smoking status: Former Smoker    Quit date: 07/29/1994    Years since quitting: 24.6  . Smokeless tobacco: Never Used  Substance and Sexual Activity  . Alcohol use: No  . Drug use: No  . Sexual activity: Never  Other Topics Concern  . Not on file  Social History Narrative  . Not on file   Social Determinants of Health   Financial Resource Strain:   . Difficulty of Paying Living Expenses: Not on file  Food Insecurity:   . Worried About Charity fundraiser in the Last Year: Not on file  . Ran Out of Food in the Last Year: Not on file  Transportation Needs:   . Lack of Transportation (Medical): Not on file  . Lack of Transportation (Non-Medical): Not on file  Physical Activity:   . Days of Exercise per Week: Not on file  . Minutes of Exercise per Session: Not on file  Stress:   . Feeling of Stress : Not on file  Social Connections:   . Frequency of Communication with Friends and Family: Not on file  . Frequency of Social Gatherings with Friends and Family: Not on file  . Attends Religious Services: Not on file  . Active Member of Clubs or Organizations: Not on  file  . Attends Archivist Meetings: Not on file  . Marital Status: Not on file  Intimate Partner Violence:   . Fear of Current or Ex-Partner: Not on file  . Emotionally Abused: Not on file  . Physically Abused: Not on file  . Sexually Abused: Not on file    Allergies   Statins  Family history:   Family History  Problem Relation Age of Onset  . Breast cancer Maternal Aunt   . Breast cancer Cousin     Current Medications:   Prior to Admission medications   Medication Sig Start Date End Date Taking? Authorizing Provider  acetaminophen (TYLENOL) 500 MG tablet Take 500-1,000 mg by mouth 2 (two) times daily.   Yes [provider]  amLODipine-olmesartan (AZOR) 5-20 MG per tablet Take 1 tablet by mouth daily.   Yes [provider]  calcium acetate (PHOSLO) 667 MG capsule Take 667 mg by mouth 2 (two)  times daily.    Yes [provider]  Cholecalciferol (VITAMIN D-3) 1000 UNITS CAPS Take 1,000 Units by mouth daily.    Yes [provider]  citalopram (CELEXA) 40 MG tablet Take 40 mg by mouth daily. 01/07/19  Yes [provider]  fexofenadine (ALLEGRA) 180 MG tablet Take 180 mg by mouth daily as needed for allergies.    Yes [provider]  fluticasone (FLONASE) 50 MCG/ACT nasal spray Place 2 sprays into both nostrils daily.   Yes [provider]  HUMALOG KWIKPEN 100 UNIT/ML KiwkPen Inject 18-34 Units into the skin 3 (three) times daily. Per sliding scale 09/04/16  Yes [provider]  HYDROcodone-acetaminophen (NORCO) 10-325 MG tablet Take 1 tablet by mouth 2 (two) times daily as needed for pain. 03/06/19  Yes [provider]  levothyroxine (SYNTHROID, LEVOTHROID) 175 MCG tablet Take 175 mcg by mouth daily before breakfast.   Yes [provider]  metFORMIN (GLUCOPHAGE-XR) 500 MG 24 hr tablet Take 500 mg by mouth 2 (two) times daily. 02/22/19  Yes [provider]  nebivolol (BYSTOLIC) 5  MG tablet Take 5 mg by mouth daily.   Yes [provider]  ondansetron (ZOFRAN) 4 MG tablet Take 4 mg by mouth every 8 (eight) hours as needed for nausea. 03/17/19 03/24/19 Yes [provider]  pantoprazole (PROTONIX) 40 MG tablet Take 40 mg by mouth daily.   Yes [provider]  predniSONE (DELTASONE) 10 MG tablet Take 30 mg by mouth daily. 03/17/19 03/23/19 Yes [provider]  TRESIBA FLEXTOUCH 200 UNIT/ML SOPN Inject 90 Units into the skin every evening. 02/16/19  Yes [provider]  vitamin B-12 (CYANOCOBALAMIN) 1000 MCG tablet Take 1,000 mcg by mouth daily. Reported on 07/07/2015   Yes [provider]  azithromycin (ZITHROMAX) 250 MG tablet Take 250-500 mg by mouth daily. 03/11/19   [provider]    Physical Exam:   Vitals:   03/19/19 1308 03/19/19 1315 03/19/19 1400 03/19/19 1530  BP:  (!) 127/49 (!) 136/54 (!) 133/51  Pulse: 78 68 70 66  Resp: (!) 32 (!) 26 (!) 25 (!) 27  Temp:      TempSrc:      SpO2: (!) 81% 94% 93% 92%     Physical Exam: Blood pressure (!) 133/51, pulse 66, temperature 99.4 F (37.4 C), temperature source Oral, resp. rate (!) 27, SpO2 92 %. Gen: No acute distress. Head: Normocephalic, atraumatic. Eyes: Pupils equal, round and reactive to light. Extraocular movements intact.  Sclerae nonicteric.  Mouth: Dry mucous membranes Neck: Supple, no thyromegaly, no lymphadenopathy, no jugular venous distention. Chest: Air entry adequate bilaterally, no wheezing heard.  She has bibasilar rales.   CV: Heart sounds are regular with an S1, S2. No murmurs, rubs or gallops.  Abdomen: Soft, nontender, nondistended with normal active bowel sounds. No palpable masses. Extremities: Extremities are without clubbing, or cyanosis. No edema. Pedal pulses 2+.  Skin: Warm and dry. No rashes, lesions or wounds Neuro: Alert and oriented times 3; grossly nonfocal.  Psych: Insight is good and judgment is appropriate. Mood  and affect normal.   Data Review:    Labs: Basic Metabolic Panel: Recent Labs  Lab 03/19/19 1218  NA 131*  K 4.1  CL 99  CO2 18*  GLUCOSE 202*  BUN 46*  CREATININE 1.65*  CALCIUM 7.6*   Liver Function Tests: Recent Labs  Lab 03/19/19 1218  AST 71*  ALT 32  ALKPHOS 54  BILITOT 0.8  PROT 6.6  ALBUMIN 3.0*   No results for input(s): LIPASE, AMYLASE in the last 168 hours. No results for input(s): AMMONIA in the last 168 hours. CBC: Recent Labs  Lab 03/19/19 1218  WBC 15.4*  NEUTROABS 14.1*  HGB 10.0*  HCT 31.2*  MCV 85.7  PLT 147*   Cardiac Enzymes: No results for input(s): CKTOTAL, CKMB, CKMBINDEX, TROPONINI in the last 168 hours.  BNP (last 3 results) No results for input(s): PROBNP in the last 8760 hours. CBG: No results for input(s): GLUCAP in the last 168 hours.  Urinalysis    Component Value Date/Time   COLORURINE YELLOW (A) 03/19/2019 1306   APPEARANCEUR CLEAR (A) 03/19/2019 1306   LABSPEC 1.015 03/19/2019 1306   PHURINE 5.0 03/19/2019 1306   GLUCOSEU NEGATIVE 03/19/2019 1306   HGBUR NEGATIVE 03/19/2019 1306   BILIRUBINUR NEGATIVE 03/19/2019 1306   KETONESUR NEGATIVE 03/19/2019 1306   PROTEINUR 100 (A) 03/19/2019 1306   NITRITE NEGATIVE 03/19/2019 1306   LEUKOCYTESUR NEGATIVE 03/19/2019 1306      Radiographic Studies: DG Chest 2 View  Result Date: 03/19/2019 CLINICAL DATA:  74 year old female with shortness of breath. Recent diagnosis of COVID-19. EXAM: CHEST - 2 VIEW COMPARISON:  Chest x-ray 01/05/2011. FINDINGS: Patchy asymmetrically distributed opacities and areas of interstitial prominence throughout the lungs bilaterally (left greater than right), concerning for multilobar pneumonia bilateral. No pleural effusions. No pneumothorax. No evidence of pulmonary edema. Heart size is normal. Upper mediastinal contours are within normal limits. IMPRESSION: 1. Severe multilobar bilateral pneumonia, as above. Electronically Signed   By: Vinnie Langton M.D.   On: 03/19/2019 11:56    EKG: Independently reviewed.  Normal sinus rhythm   Assessment/Plan:   Principal Problem:   Pneumonia due to COVID-19 virus Active Problems:   Acute hypoxemic respiratory failure (HCC)   Cirrhosis (HCC)   Cirrhosis of liver without ascites (HCC)   Cancer of right kidney (HCC)   Hyponatremia    There is no height or weight on file to calculate BMI.   Severe COVID-19 pneumonia with acute hypoxemic respiratory failure: Mid to telemetry.  Treat with IV dexamethasone and remdesivir.  Dexamethasone will be started tomorrow since she got prednisone today.  Procalcitonin was normal so she will be covered with empiric IV antibiotics.  Taper off oxygen as able.  Recommended transfer to [facility] for further management because she was requiring 5 L of oxygen.  However, patient said she does not want to go to [facility] and wanted to stay here for treatment.  Patient's daughter who works at the pulmonary clinic as a Freight forwarder specifically requested pulmonary consult to assist with management.  Consulted Dr. Brooke Bonito, pulmonologist.  Insulin-dependent diabetes mellitus: Continue Tyler Aas.  NovoLog as needed for hyperglycemia.  Hold Metformin.  COVID-19 gastroenteritis/diarrhea: Encourage adequate oral intake.  Hyponatremia: Probably from diarrhea.  Patient got 1 L of normal saline in the ED.  Follow-up BMP.  Elevated creatinine with probable underlying CKD stage IIIa: Monitor BMP closely.  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     Other information:   DVT prophylaxis: Lovenox Code Status: Full code. Family Communication: Plan discussed with his daughter, Butch Penny at the bedside Disposition Plan: Possible discharge to home in 4 to 5 days Consults called: None Admission status: Inpatient  The medical decision making on this patient  was of high complexity and the patient is at high risk for clinical  deterioration, therefore this is a level 3 visit.    Time spent 60 minutes  [place] [facility]   How to contact the [facility] Attending or Consulting provider [place] or covering provider during after hours [place], for this patient?   1. Check the care team in [facility] and look for a) attending/consulting [facility] provider listed and b) the [facility] team listed 2. Log into [email or web address] and use [place] universal password to access. If you do not have the password, please contact the hospital operator. 3. Locate the [facility] provider you are looking for under [facility] and page to a number that you can be directly reached. 4. If you still have difficulty reaching the provider, please page the [facility] (Director on Call) for the Hospitalists listed on amion for assistance.  03/19/2019, 3:32 PM

## 2019-03-20 ENCOUNTER — Inpatient Hospital Stay
Admit: 2019-03-20 | Discharge: 2019-03-20 | Disposition: A | Discharge status: Home / Self Care | Payer: Medicare Other | Attending: Pulmonary Disease | Admitting: Pulmonary Disease

## 2019-03-20 ENCOUNTER — Inpatient Hospital Stay: Payer: Medicare Other

## 2019-03-20 DIAGNOSIS — C641 Malignant neoplasm of right kidney, except renal pelvis: Secondary | ICD-10-CM

## 2019-03-20 LAB — CBC WITH DIFFERENTIAL/PLATELET
Abs Immature Granulocytes: 0.12 10*3/uL — ABNORMAL HIGH (ref 0.00–0.07)
Basophils Absolute: 0 10*3/uL (ref 0.0–0.1)
Basophils Relative: 0 %
Eosinophils Absolute: 0 10*3/uL (ref 0.0–0.5)
Eosinophils Relative: 0 %
HCT: 29.7 % — ABNORMAL LOW (ref 36.0–46.0)
Hemoglobin: 9.7 g/dL — ABNORMAL LOW (ref 12.0–15.0)
Immature Granulocytes: 1 %
Lymphocytes Relative: 5 %
Lymphs Abs: 0.9 10*3/uL (ref 0.7–4.0)
MCH: 27.9 pg (ref 26.0–34.0)
MCHC: 32.7 g/dL (ref 30.0–36.0)
MCV: 85.3 fL (ref 80.0–100.0)
Monocytes Absolute: 0.6 10*3/uL (ref 0.1–1.0)
Monocytes Relative: 4 %
Neutro Abs: 14.2 10*3/uL — ABNORMAL HIGH (ref 1.7–7.7)
Neutrophils Relative %: 90 %
Platelets: 168 10*3/uL (ref 150–400)
RBC: 3.48 MIL/uL — ABNORMAL LOW (ref 3.87–5.11)
RDW: 14 % (ref 11.5–15.5)
WBC: 15.8 10*3/uL — ABNORMAL HIGH (ref 4.0–10.5)
nRBC: 0 % (ref 0.0–0.2)

## 2019-03-20 LAB — COMPREHENSIVE METABOLIC PANEL
ALT: 28 U/L (ref 0–44)
AST: 56 U/L — ABNORMAL HIGH (ref 15–41)
Albumin: 2.7 g/dL — ABNORMAL LOW (ref 3.5–5.0)
Alkaline Phosphatase: 52 U/L (ref 38–126)
Anion gap: 11 (ref 5–15)
BUN: 36 mg/dL — ABNORMAL HIGH (ref 8–23)
CO2: 21 mmol/L — ABNORMAL LOW (ref 22–32)
Calcium: 7.6 mg/dL — ABNORMAL LOW (ref 8.9–10.3)
Chloride: 104 mmol/L (ref 98–111)
Creatinine, Ser: 1.23 mg/dL — ABNORMAL HIGH (ref 0.44–1.00)
GFR calc Af Amer: 50 mL/min — ABNORMAL LOW (ref >60)
GFR calc non Af Amer: 43 mL/min — ABNORMAL LOW (ref >60)
Glucose, Bld: 79 mg/dL (ref 70–99)
Potassium: 4.3 mmol/L (ref 3.5–5.1)
Sodium: 136 mmol/L (ref 135–145)
Total Bilirubin: 0.6 mg/dL (ref 0.3–1.2)
Total Protein: 6.3 g/dL — ABNORMAL LOW (ref 6.5–8.1)

## 2019-03-20 LAB — GLUCOSE, CAPILLARY
Glucose-Capillary: 119 mg/dL — ABNORMAL HIGH (ref 70–99)
Glucose-Capillary: 84 mg/dL (ref 70–99)
Glucose-Capillary: 70 mg/dL (ref 70–99)
Glucose-Capillary: 101 mg/dL — ABNORMAL HIGH (ref 70–99)
Glucose-Capillary: 132 mg/dL — ABNORMAL HIGH (ref 70–99)
Glucose-Capillary: 200 mg/dL — ABNORMAL HIGH (ref 70–99)

## 2019-03-20 LAB — ECHOCARDIOGRAM COMPLETE
Height: 65 in
Weight: 3467.39 oz

## 2019-03-20 LAB — MRSA PCR SCREENING: MRSA by PCR: NEGATIVE

## 2019-03-20 LAB — FERRITIN: Ferritin: 138 ng/mL (ref 11–307)

## 2019-03-20 LAB — FIBRIN DERIVATIVES D-DIMER (ARMC ONLY): Fibrin derivatives D-dimer (ARMC): 1330.76 ng/mL (FEU) — ABNORMAL HIGH (ref 0.00–499.00)

## 2019-03-20 MED ORDER — AZITHROMYCIN 500 MG PO TABS
500.0000 mg | ORAL_TABLET | Freq: Every day | ORAL | Status: DC
  Administered 2019-03-20 – 2019-03-23 (×4): 500 mg via ORAL
  Filled 2019-03-20 (×4): qty 1

## 2019-03-20 MED ORDER — CHLORHEXIDINE GLUCONATE CLOTH 2 % EX PADS
6.0000 | MEDICATED_PAD | Freq: Every day | CUTANEOUS | Status: DC
  Administered 2019-03-20 – 2019-04-06 (×12): 6 via TOPICAL

## 2019-03-20 MED ORDER — NEPRO/CARBSTEADY PO LIQD: 237.0000 mL | Freq: Three times a day (TID) | ORAL | Status: DC

## 2019-03-20 MED ORDER — ADULT MULTIVITAMIN W/MINERALS CH
1.0000 | ORAL_TABLET | Freq: Every day | ORAL | Status: DC
  Administered 2019-03-21 – 2019-04-02 (×13): 1 via ORAL
  Filled 2019-03-20 (×13): qty 1

## 2019-03-20 MED ORDER — FUROSEMIDE 10 MG/ML IJ SOLN
20.0000 mg | Freq: Two times a day (BID) | INTRAMUSCULAR | Status: DC
  Administered 2019-03-20 – 2019-03-21 (×2): 20 mg via INTRAVENOUS
  Filled 2019-03-20 (×2): qty 2

## 2019-03-20 MED ORDER — INSULIN ASPART 100 UNIT/ML ~~LOC~~ SOLN
0.0000 [IU] | SUBCUTANEOUS | Status: DC
  Administered 2019-03-20: 17:00:00 1 [IU] via SUBCUTANEOUS
  Administered 2019-03-20 – 2019-03-21 (×3): 3 [IU] via SUBCUTANEOUS
  Administered 2019-03-21: 2 [IU] via SUBCUTANEOUS
  Administered 2019-03-21: 5 [IU] via SUBCUTANEOUS
  Administered 2019-03-22: 3 [IU] via SUBCUTANEOUS
  Administered 2019-03-22: 15 [IU] via SUBCUTANEOUS
  Administered 2019-03-22 – 2019-03-23 (×2): 3 [IU] via SUBCUTANEOUS
  Filled 2019-03-20 (×11): qty 1

## 2019-03-20 MED ORDER — ASCORBIC ACID 500 MG PO TABS
500.0000 mg | ORAL_TABLET | Freq: Every day | ORAL | Status: DC
  Administered 2019-03-20 – 2019-04-02 (×14): 500 mg via ORAL
  Filled 2019-03-20 (×14): qty 1

## 2019-03-20 MED ORDER — DEXAMETHASONE SODIUM PHOSPHATE 10 MG/ML IJ SOLN
6.0000 mg | Freq: Two times a day (BID) | INTRAMUSCULAR | Status: AC
  Administered 2019-03-20 – 2019-03-29 (×19): 6 mg via INTRAVENOUS
  Filled 2019-03-20 (×21): qty 0.6

## 2019-03-20 NOTE — Progress Notes (Signed)
[facility] received referral for pt. visit from MD during ICU rounds.  Consulted w/charge RN and planned afternoon visit.  [place] briefed Reid on [facility] referral upon her arrival at ICU and delegated visit upon her request.  [facility] Belenda Cruise will visit pt. w/PPE this afternoon.       03/20/19 1300  Clinical Encounter Type  Visited With Health care provider  Visit Type Follow-up  Referral From Physician  Consult/Referral To Chaplain

## 2019-03-20 NOTE — Progress Notes (Signed)
CRITICAL CARE PROGRESS NOTE    Name: Rebecca Kirby MRN: [id number] DOB: 1945/05/27     LOS: 1   SUBJECTIVE FINDINGS & SIGNIFICANT EVENTS   Patient description:   This is a very pleasant 74 year old female with a history of chronic anemia, anxiety disorder, history of colon CA, GERD, hypothyroidism renal cell carcinoma history of thyroid CA, pancreatic CA status post Whipple's procedure, diabetes, came into the ED due to worsening hypoxemia post COVID-19 infection.  Patient has been positive for COVID-19 since March 11, 2019 has initially been relatively minimally symptomatic with prednisone taper and Zithromax prescribed by primary care doctor on the 15th.  Over the last 48 hours patient had also developed fevers and loose stools T-max at 101.9 and again had similar fever and diarrhea today.  Her oxygen saturation started to get worse noted to be at approximately 78% on room air.  Patient generally does not like to go to the hospital and has initially declined to come in however her daughter was insisting that she comes in and get medical attention.  In the ED she was noted to be 85% on 2 L which improved with increased supplemental oxygen satting at approximately 90% on 5 to 6 L/min nasal cannula.  Chest x-ray was reviewed independently by me which is consistent with multilobar bilateral airspace opacification consistent with COVID-19 induced pneumonia.  In the ED she received her first dose of remdesivir as well as resuscitative IV fluids.  During my examination patient does not appear to be in distress he is nontachycardic with mild tachypnea respiratory rate ~25.  She had high-sensitivity troponins done today with a mild elevation at 29 and trending down to 27 with repeat.  Blood sugars have been somewhat elevated with  most recent at 197.  Procalcitonin is mildly elevated 0.38 as well as mild elevation BNP at 139.    Lines / Drains: PIVx2  Cultures / Sepsis markers: Respiratory culture -in progress MRSA PCT - negative Blood culture x2 - NTDx24h -reincubation in progress  Antibiotics: zithromax 500 PO daily    Protocols / Consultants: PCCM Hospitalist  Tests / Events: [place] - Lower extermities - negative for DVT bilaterally   Overnight: Patient with worsening hypoxemia requiring NRB transferred to MICU for closer monitoring and further management.    PAST MEDICAL HISTORY   Past Medical History:  Diagnosis Date  . Anemia   . Anxiety   . Colon cancer ([place])   . Diabetes mellitus without complication ([place])   . Family history of adverse reaction to anesthesia    daughter had reaction to anesthesia and was in icu  . GERD (gastroesophageal reflux disease)   . Hypertension   . Hypothyroidism   . 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 Pancreas cancer ([place]) 2018  . Thyroid cancer [facility])      SURGICAL HISTORY   Past Surgical History:  Procedure Laterality Date  . CHOLECYSTECTOMY    . COLON RESECTION    . COLONOSCOPY WITH PROPOFOL N/A 09/01/2015   Procedure: COLONOSCOPY WITH PROPOFOL;  Surgeon: Manya Silvas, MD;  Location: [facility] ENDOSCOPY;  Service: Endoscopy;  Laterality: N/A;  . CYSTOSCOPY  12/28/2016   Procedure: CYSTOSCOPY;  Surgeon: Ward, Honor Loh, MD;  Location: [facility] ORS;  Service: Gynecology;;  . ESOPHAGOGASTRODUODENOSCOPY (EGD) WITH PROPOFOL N/A 09/01/2015   Procedure: ESOPHAGOGASTRODUODENOSCOPY (EGD) WITH PROPOFOL;  Surgeon: Manya Silvas, MD;  Location: [facility] ENDOSCOPY;  Service: Endoscopy;  Laterality: N/A;  . EUS N/A 06/28/2016   Procedure: FULL UPPER ENDOSCOPIC ULTRASOUND (EUS) RADIAL;  Surgeon: Jola Schmidt, MD;  Location: [facility] ENDOSCOPY;  Service: Endoscopy;  Laterality: N/A;  . EXCISION MASS NECK N/A 09/20/2015    Procedure: EXCISION MASS NECK;  Surgeon: Beverly Gust, MD;  Location: [place];  Service: ENT;  Laterality: N/A;  . LAPAROSCOPIC BILATERAL SALPINGO OOPHERECTOMY Bilateral 12/28/2016   Procedure: LAPAROSCOPIC BILATERAL SALPINGO OOPHORECTOMY;  Surgeon: Ward, Honor Loh, MD;  Location: [facility] ORS;  Service: Gynecology;  Laterality: Bilateral;  . LAPAROSCOPIC HYSTERECTOMY N/A 12/28/2016   Procedure: HYSTERECTOMY TOTAL LAPAROSCOPIC;  Surgeon: Ward, Honor Loh, MD;  Location: [facility] ORS;  Service: Gynecology;  Laterality: N/A;  . LAPAROSCOPIC LYSIS OF ADHESIONS  12/28/2016   Procedure: LAPAROSCOPIC EXTENSIVE LYSIS OF ADHESIONS;  Surgeon: Ward, Honor Loh, MD;  Location: [facility] ORS;  Service: Gynecology;;  . rt kidney removed Right   . THYROGLOSSAL DUCT CYST Right 09/20/2015   Procedure: excision sub mandibular gland right;  Surgeon: Beverly Gust, MD;  Location: [place];  Service: ENT;  Laterality: Right;  . THYROID SURGERY Bilateral   . TOTAL KNEE ARTHROPLASTY Right   . TOTAL KNEE ARTHROPLASTY Left 08/09/2014   Procedure: TOTAL KNEE ARTHROPLASTY;  Surgeon: Dereck Leep, MD;  Location: [facility] ORS;  Service: Orthopedics;  Laterality: Left;     FAMILY HISTORY   Family History  Problem Relation Age of Onset  . Breast cancer Maternal Aunt   . Breast cancer Cousin      SOCIAL HISTORY   Social History   Tobacco Use  . Smoking status: Former Smoker    Quit date: 07/29/1994    Years since quitting: 24.6  . Smokeless tobacco: Never Used  Substance Use Topics  . Alcohol use: No  . Drug use: No     MEDICATIONS   Current Medication:  Current Facility-Administered Medications:  .  acetaminophen (TYLENOL) tablet 650 mg, 650 mg, Oral, Q6H PRN, Jennye Boroughs, MD, 650 mg at 03/20/19 0944 .  ascorbic acid (VITAMIN C) tablet 500 mg, 500 mg, Oral, Daily, Lumi Winslett, MD, 500 mg at 03/20/19 1312 .  calcium acetate (PHOSLO) capsule 667 mg, 667 mg, Oral, BID, Jennye Boroughs, MD,  667 mg at 03/19/19 2202 .  Chlorhexidine Gluconate Cloth 2 % PADS 6 each, 6 each, Topical, Daily, Ottie Glazier, MD, 6 each at 03/20/19 0946 .  cholecalciferol (VITAMIN D3) tablet 1,000 Units, 1,000 Units, Oral, Daily, Jennye Boroughs, MD, 1,000 Units at 03/20/19 0944 .  citalopram (CELEXA) tablet 40 mg, 40 mg, Oral, Daily, Jennye Boroughs, MD, 40 mg at 03/20/19 0944 .  cloNIDine (CATAPRES) tablet 0.1 mg, 0.1 mg, Oral, TID, Jennye Boroughs, MD .  dexamethasone (DECADRON) injection 6 mg, 6 mg, Intravenous, Q12H, Ladislav Caselli, MD .  enoxaparin (LOVENOX) injection 40 mg, 40 mg, Subcutaneous, Q24H, Jennye Boroughs, MD, 40 mg at 03/19/19 2202 .  feeding supplement (NEPRO CARB STEADY) liquid 237 mL, 237 mL, Oral, TID BM, Jazyiah Yiu, MD .  fluticasone (FLONASE) 50 MCG/ACT nasal spray 2 spray, 2 spray, Each Nare, Daily, Jennye Boroughs, MD .  furosemide (LASIX) injection 20 mg, 20 mg, Intravenous, BID, Lanney Gins, Jameika Kinn, MD .  HYDROcodone-acetaminophen (NORCO) 10-325 MG per tablet 1 tablet, 1 tablet, Oral, BID PRN, Jennye Boroughs, MD .  insulin aspart (novoLOG) injection 0-15 Units, 0-15 Units, Subcutaneous, Q4H, Wyn Nettle, MD .  insulin glargine (LANTUS) injection 40 Units, 40 Units, Subcutaneous, QHS, Jennye Boroughs, MD, 40 Units at 03/19/19 2153 .  ivermectin (STROMECTOL) tablet 9,000 mcg, 150 mcg/kg (Ideal), Oral, Daily, Jennye Boroughs, MD .  levothyroxine (SYNTHROID) tablet 175 mcg, 175 mcg, Oral, Q0600, Jennye Boroughs, MD, 175 mcg at 03/20/19 [phone] .  [START ON 03/21/2019] multivitamin with minerals tablet 1 tablet, 1 tablet, Oral, Daily, Teghan Philbin, MD .  ondansetron (ZOFRAN) tablet 4 mg, 4 mg, Oral, Q6H PRN **OR** ondansetron (ZOFRAN) injection 4 mg, 4 mg, Intravenous, Q6H PRN, Jennye Boroughs, MD .  pantoprazole (PROTONIX) EC tablet 40 mg, 40 mg, Oral, Daily, Jennye Boroughs, MD, 40 mg at 03/20/19 0944 .  [COMPLETED] remdesivir 200 mg in sodium chloride 0.9% 250 mL IVPB, 200 mg,  Intravenous, Once, Stopped at 03/19/19 1345 **FOLLOWED BY** remdesivir 100 mg in sodium chloride 0.9 % 100 mL IVPB, 100 mg, Intravenous, Daily, Jennye Boroughs, MD .  vitamin B-12 (CYANOCOBALAMIN) tablet 1,000 mcg, 1,000 mcg, Oral, Daily, Jennye Boroughs, MD, 1,000 mcg at 03/20/19 0944    ALLERGIES   Statins    REVIEW OF SYSTEMS     10 point ROS neg except as per sujective findings  PHYSICAL EXAMINATION   Vital Signs: Temp:  [99 F (37.2 C)-99.2 F (37.3 C)] 99 F (37.2 C) (01/21 2158) Pulse Rate:  [66-93] 81 (01/22 1100) Resp:  [14-33] 14 (01/22 1100) BP: (125-145)/(44-119) 132/119 (01/22 1100) SpO2:  [82 %-95 %] 91 % (01/22 1100) FiO2 (%):  [100 %] 100 % (01/22 0800) Weight:  [98.3 kg] 98.3 kg (01/21 1700)  GENERAL:well nourished HEAD: Normocephalic, atraumatic.  EYES: Pupils equal, round, reactive to light.  No scleral icterus.  MOUTH: Moist mucosal membrane. NECK: Supple. No thyromegaly. No nodules. No JVD.  PULMONARY: bilateral rhonchorous breath sounds with crackles at bases bilatearally CARDIOVASCULAR: S1 and S2. Regular rate and rhythm. No murmurs, rubs, or gallops.  GASTROINTESTINAL: Soft, nontender, non-distended. No masses. Positive bowel sounds. No hepatosplenomegaly.  MUSCULOSKELETAL: No swelling, clubbing, or edema.  NEUROLOGIC: Mild distress due to acute illness SKIN:intact,warm,dry   PERTINENT DATA     Infusions: . remdesivir 100 mg in NS 100 mL     Scheduled Medications: . vitamin C  500 mg Oral Daily  . calcium acetate  667 mg Oral BID  . Chlorhexidine Gluconate Cloth  6 each Topical Daily  . cholecalciferol  1,000 Units Oral Daily  . citalopram  40 mg Oral Daily  . cloNIDine  0.1 mg Oral TID  . dexamethasone (DECADRON) injection  6 mg Intravenous Q12H  . enoxaparin (LOVENOX) injection  40 mg Subcutaneous Q24H  . feeding supplement (NEPRO CARB STEADY)  237 mL Oral TID BM  . fluticasone  2 spray Each Nare Daily  . furosemide  20 mg  Intravenous BID  . insulin aspart  0-15 Units Subcutaneous Q4H  . insulin glargine  40 Units Subcutaneous QHS  . ivermectin  150 mcg/kg (Ideal) Oral Daily  . levothyroxine  175 mcg Oral Q0600  . [START ON 03/21/2019] multivitamin with minerals  1 tablet Oral Daily  . pantoprazole  40 mg Oral Daily  . vitamin B-12  1,000 mcg Oral Daily   PRN Medications: acetaminophen, HYDROcodone-acetaminophen, ondansetron **OR** ondansetron (ZOFRAN) IV Hemodynamic parameters:   Intake/Output: 01/21 0701 - 01/22 0700 In: 1289.5 [IV Piggyback:1289.5] Out: 100 [Urine:100]  Ventilator  Settings: FiO2 (%):  [100 %] 100 %    LAB RESULTS:  Basic Metabolic Panel: Recent Labs  Lab 03/19/19 1218 03/20/19 0318  NA 131* 136  K 4.1 4.3  CL 99 104  CO2 18* 21*  GLUCOSE 202* 79  BUN 46* 36*  CREATININE 1.65* 1.23*  CALCIUM 7.6* 7.6*   Liver Function Tests: Recent Labs  Lab 03/19/19 1218 03/20/19 0318  AST 71* 56*  ALT 32 28  ALKPHOS 54 52  BILITOT 0.8 0.6  PROT 6.6 6.3*  ALBUMIN 3.0* 2.7*   No results for input(s): LIPASE, AMYLASE in the last 168 hours. No results for input(s): AMMONIA in the last 168 hours. CBC: Recent Labs  Lab 03/19/19 1218 03/20/19 0318  WBC 15.4* 15.8*  NEUTROABS 14.1* 14.2*  HGB 10.0* 9.7*  HCT 31.2* 29.7*  MCV 85.7 85.3  PLT 147* 168   Cardiac Enzymes: No results for input(s): CKTOTAL, CKMB, CKMBINDEX, TROPONINI in the last 168 hours. BNP: Invalid input(s): POCBNP CBG: Recent Labs  Lab 03/19/19 2013 03/20/19 0139 03/20/19 0353 03/20/19 0942 03/20/19 1144  GLUCAP 211* 119* 84 70 101*     IMAGING RESULTS:  Imaging: DG Chest 2 View  Result Date: 03/19/2019 CLINICAL DATA:  74 year old female with shortness of breath. Recent diagnosis of COVID-19. EXAM: CHEST - 2 VIEW COMPARISON:  Chest x-ray 01/05/2011. FINDINGS: Patchy asymmetrically distributed opacities and areas of interstitial prominence throughout the lungs bilaterally (left greater than  right), concerning for multilobar pneumonia bilateral. No pleural effusions. No pneumothorax. No evidence of pulmonary edema. Heart size is normal. Upper mediastinal contours are within normal limits. IMPRESSION: 1. Severe multilobar bilateral pneumonia, as above. Electronically Signed   By: Vinnie Langton M.D.   On: 03/19/2019 11:56   US Venous Img Lower Bilateral (DVT)  Result Date: 03/19/2019 CLINICAL DATA:  Dyspnea.  COVID-19 positive.  Inpatient. EXAM: BILATERAL LOWER EXTREMITY VENOUS DOPPLER ULTRASOUND TECHNIQUE: Gray-scale sonography with compression, as well as color and duplex ultrasound, were performed to evaluate the deep venous system(s) from the level of the common femoral vein through the popliteal and proximal calf veins. COMPARISON:  None. FINDINGS: VENOUS Normal compressibility of the common femoral, superficial femoral, and popliteal veins, as well as the visualized calf veins. Visualized portions of profunda femoral vein and great saphenous vein unremarkable. No filling defects to suggest DVT on grayscale or color Doppler imaging. Doppler waveforms show normal direction of venous flow, normal respiratory phasicity and response to augmentation. Limited views of the contralateral common femoral vein are unremarkable. OTHER None. Limitations: none IMPRESSION: No femoropopliteal DVT nor evidence of DVT within the visualized calf veins in either lower extremity. If clinical symptoms are inconsistent or if there are persistent or worsening symptoms, further imaging (possibly involving the iliac veins) may be warranted. Electronically Signed   By: Ilona Sorrel M.D.   On: 03/19/2019 18:18   DG Chest Port 1 View  Result Date: 03/20/2019 CLINICAL DATA:  Pulmonary disease. COVID-19 pneumonia. Acute hypoxemic respiratory failure. EXAM: PORTABLE CHEST 1 VIEW COMPARISON:  Chest x-ray dated 03/19/2019 FINDINGS: There has been significant progression of the bilateral pulmonary infiltrates. Heart size  and pulmonary vascularity are normal. No effusions. No acute bone abnormality. IMPRESSION: Significant progression of bilateral pulmonary infiltrates consistent with COVID pneumonia. Electronically Signed   By: Lorriane Shire M.D.   On: 03/20/2019 10:55   ECHOCARDIOGRAM COMPLETE  Result Date: 03/20/2019   ECHOCARDIOGRAM REPORT   Patient Name:   ANGELAMARIE CSIZMADIA Date of Exam: 03/20/2019 Medical Rec #:  [id number]       Height:       65.0 in Accession #:    [id number]      Weight:       216.7 lb Date of Birth:  Sep 13, 1945  BSA:          2.05 m Patient Age:    63 years        BP:           125/74 mmHg Patient Gender: F               HR:           72 bpm. Exam Location:  [facility] Procedure: 2D Echo, Color Doppler and Cardiac Doppler Indications:     I26.99 Pulmonary Embolus  History:         Patient has no prior history of Echocardiogram examinations.                  Risk Factors:Hypertension and Diabetes.  Sonographer:     Charmayne Sheer RDCS (AE) Referring Phys:  [id number] Ottie Glazier Diagnosing Phys: Yolonda Kida MD  Sonographer Comments: Suboptimal subcostal window. IMPRESSIONS  1. Left ventricular ejection fraction, by visual estimation, is 70 to 75%. The left ventricle has normal function. Left ventricular septal wall thickness was normal. Normal left ventricular posterior wall thickness. There is no left ventricular hypertrophy.  2. Left ventricular diastolic parameters are consistent with Grade I diastolic dysfunction (impaired relaxation).  3. The left ventricle has no regional wall motion abnormalities.  4. Global right ventricle has normal systolic function.The right ventricular size is normal. No increase in right ventricular wall thickness.  5. Left atrial size was normal.  6. Right atrial size was normal.  7. The mitral valve is normal in structure. No evidence of mitral valve regurgitation.  8. The tricuspid valve is normal in structure.  9. The tricuspid valve is normal in structure. Tricuspid valve  regurgitation is mild. 10. The aortic valve is normal in structure. Aortic valve regurgitation is not visualized. 11. The pulmonic valve was normal in structure. Pulmonic valve regurgitation is not visualized. 12. Evidence of atrial level shunting detected by color flow Doppler. FINDINGS  Left Ventricle: Left ventricular ejection fraction, by visual estimation, is 70 to 75%. The left ventricle has normal function. The left ventricle has no regional wall motion abnormalities. The left ventricular internal cavity size was the left ventricle is normal in size. Normal left ventricular posterior wall thickness. There is no left ventricular hypertrophy. Left ventricular diastolic parameters are consistent with Grade I diastolic dysfunction (impaired relaxation). Right Ventricle: The right ventricular size is normal. No increase in right ventricular wall thickness. Global RV systolic function is has normal systolic function. Left Atrium: Left atrial size was normal in size. Right Atrium: Right atrial size was normal in size Pericardium: There is no evidence of pericardial effusion. Mitral Valve: The mitral valve is normal in structure. No evidence of mitral valve regurgitation. MV peak gradient, 5.3 mmHg. Tricuspid Valve: The tricuspid valve is normal in structure. Tricuspid valve regurgitation is mild. Aortic Valve: The aortic valve is normal in structure. Aortic valve regurgitation is not visualized. Aortic valve mean gradient measures 7.0 mmHg. Aortic valve peak gradient measures 13.2 mmHg. Aortic valve area, by VTI measures 2.14 cm. Pulmonic Valve: The pulmonic valve was normal in structure. Pulmonic valve regurgitation is not visualized. Pulmonic regurgitation is not visualized. Aorta: The aortic root is normal in size and structure. IAS/Shunts: Evidence of atrial level shunting detected by color flow Doppler.  LEFT VENTRICLE PLAX 2D LVIDd:         4.59 cm  Diastology LVIDs:         2.49 cm  LV e' lateral:  6.96  cm/s LV PW:         1.00 cm  LV E/e' lateral: 15.8 LV IVS:        0.70 cm  LV e' medial:    5.22 cm/s LVOT diam:     1.80 cm  LV E/e' medial:  21.1 LV SV:         75 ml LV SV Index:   34.50 LVOT Area:     2.54 cm  RIGHT VENTRICLE RV Basal diam:  2.40 cm LEFT ATRIUM              Index       RIGHT ATRIUM           Index LA diam:        4.60 cm  2.25 cm/m  RA Area:     14.40 cm LA Vol (A2C):   51.7 ml  25.26 ml/m RA Volume:   33.60 ml  16.42 ml/m LA Vol (A4C):   101.0 ml 49.35 ml/m LA Biplane Vol: 73.9 ml  36.11 ml/m  AORTIC VALVE                    PULMONIC VALVE AV Area (Vmax):    1.99 cm     PV Vmax:       1.56 m/s AV Area (Vmean):   2.04 cm     PV Vmean:      105.000 cm/s AV Area (VTI):     2.14 cm     PV VTI:        0.252 m AV Vmax:           182.00 cm/s  PV Peak grad:  9.7 mmHg AV Vmean:          125.000 cm/s PV Mean grad:  5.0 mmHg AV VTI:            0.350 m AV Peak Grad:      13.2 mmHg AV Mean Grad:      7.0 mmHg LVOT Vmax:         142.00 cm/s LVOT Vmean:        100.000 cm/s LVOT VTI:          0.294 m LVOT/AV VTI ratio: 0.84  AORTA Ao Root diam: 3.50 cm MITRAL VALVE MV Area (PHT): 2.80 cm              SHUNTS MV Peak grad:  5.3 mmHg              Systemic VTI:  0.29 m MV Mean grad:  3.0 mmHg              Systemic Diam: 1.80 cm MV Vmax:       1.15 m/s MV Vmean:      76.0 cm/s MV VTI:        0.37 m MV PHT:        78.59 msec MV Decel Time: 271 msec MV E velocity: 110.00 cm/s 103 cm/s MV A velocity: 95.60 cm/s  70.3 cm/s MV E/A ratio:  1.15        1.5  Dwayne D Callwood MD Electronically signed by Yolonda Kida MD Signature Date/Time: 03/20/2019/12:42:15 PM    Final          ASSESSMENT AND PLAN    -Multidisciplinary rounds held today  Acute hypoxemic respiratory failure  - due to COVID19 induced bilateral multifocal pneumonia -patient is diabetic sensitive to steroids - will require frequent  checks- currently at q4h - Decadron 6mg  IV daily changing to BID today due to worsening  infiltrates. -will diurese gently - adding lasix 20 bid - pt with CKD - Remdesevir load and mainatance x 5d - bronchopulmonary hygiene with xopenex and incentive spirometer if patient can participate  -Ivermectin 111mcg/kg of ideal body weight - low dose 9mg  p.o. daily - [place] lower - no DVT - lower likelihood of PE will not perform CT chest to avoid contrast to spare renal function -daily hepatic panel or CMP -Beta-D-Glucan to assess for fungal component due to multiple steroid bursts recently. -appreciate hospitalist team collaboration   Multiple comorbid conditions -Hx of RCC, Pancreatic CA, thyroid CA, colon CA -hx of liver cirrosis and CKD - predisposing to hypercoagulable state -currently on Lovenox PPX dose 40 [place] daily will increase to bid while acutely ill.    Essential HTN  I will stop norvasc, bystolic and ibersartan to help decrase hepatoxicity while on remdesevir and ivermectin, in the interim will use low dose clonidine TID to tx HTN and help patients anxious state simultaneously -ICU telemetry monitoring -HR and BP have been in reference range on this regimen and patient states she is not anxious.    Chronic diastilic CHF - Grade I  - strict I&Os  - gentle diuresis in lieu of CKD  - fluid restriction to 1200cc/24h  -repeat CXR daily    Hypothyroidism -continue home synthroid   Chronic Renal Failure -follow chem 7 -follow UO -continue PureWick external urinary Catheter-assess need daily -patient on diuretic 20 bid Lasix - strict I&Os -limit PO fluid intake to 1200cc/24h -stop non-essential nephrotoxic medications - d/c protonix    ID -continue Zithromax abx as prescibed -follow up cultures  GI/Nutrition GI PROPHYLAXIS as indicated-stopping PPI  DIET-->TF's as tolerated Constipation protocol as indicated  ENDO - ICU hypoglycemic\Hyperglycemia protocol -check FSBS per protocol - currently q4h    ELECTROLYTES -follow labs only as needed -replace as  needed -pharmacy consultation   DVT/GI PRX ordered -SCDs  TRANSFUSIONS AS NEEDED MONITOR FSBS ASSESS the need for LABS as needed   Critical care provider statement:    Critical care time (minutes):  33   Critical care time was exclusive of:  Separately billable procedures and treating other patients   Critical care was necessary to treat or prevent imminent or life-threatening deterioration of the following conditions:  Acute hypoxemic respiratory failure, COVID19 Pneumonia, multiple comorbid conditions.    Critical care was time spent personally by me on the following activities:  Development of treatment plan with patient or surrogate, discussions with consultants, evaluation of patient's response to treatment, examination of patient, obtaining history from patient or surrogate, ordering and performing treatments and interventions, ordering and review of laboratory studies and re-evaluation of patient's condition.  I assumed direction of critical care for this patient from another provider in my specialty: no    This document was prepared using Dragon voice recognition software and may include unintentional dictation errors.    Ottie Glazier, M.D.  Division of [place]

## 2019-03-20 NOTE — Progress Notes (Signed)
Progress Note    Rebecca Kirby  [id number] DOB: 1945-12-03  DOA: 03/19/2019 PCP: Idelle Crouch, MD       Assessment/Plan:   Principal Problem:   Pneumonia due to COVID-19 virus Active Problems:   Acute hypoxemic respiratory failure ([place])   Cirrhosis ([place])   Cirrhosis of liver without ascites ([place])   Cancer of right kidney ([place])   Hyponatremia   Body mass index is 36.06 kg/m.  (Morbid obesity)   Severe COVID-19 pneumonia with acute hypoxemic respiratory failure: Worsening respiratory failure.  Keep on nonrebreathing mask for now.  Continue IV dexamethasone and remdesivir.  Ivermectin was added by pulmonologist.  Nonsustained ventricular tachycardia: Monitor on telemetry.  Insulin-dependent diabetes mellitus: Continue Lantus.  NovoLog as needed for hyperglycemia.  Hold Metformin.   COVID-19 gastroenteritis/diarrhea: Encourage adequate oral intake.  Hyponatremia: Improved.  AKI with probable underlying CKD stage IIIa: Creatinine improved.  Monitor BMP closely.  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  Case was discussed with intensivist, Dr. Lanney Gins, and he has graciously accepted the patient in transfer to the ICU for further management.  I called her daughter to update her on plan of care but there was no response I left a voice message.  CRITICAL CARE Performed by: Jennye Boroughs   Total critical care time: 35 minutes  Critical care time was exclusive of separately billable procedures and treating other patients.  Critical care was necessary to treat or prevent imminent or life-threatening deterioration.  Critical care was time spent personally by me on the following activities: development of treatment plan with patient and/or surrogate as well as nursing, discussions with consultants, evaluation of patient's response to treatment, examination of  patient, obtaining history from patient or surrogate, ordering and performing treatments and interventions, ordering and review of laboratory studies, ordering and review of radiographic studies, pulse oximetry and re-evaluation of patient's condition.   Total critical care time spent was 35 minutes   Family Communication/Anticipated D/C date and plan/Code Status   DVT prophylaxis: Lovenox Code Status: Full code Family Communication: Discussed with patient Disposition Plan: Transfer to ICU      Subjective:   Overnight events noted.  Oxygen requirement increased to 14 L/min.  This morning, patient was more short of breath and very tachypneic.  Rapid response was called.  She was placed on 15 L/min oxygen via nonrebreathing mask.  She also had runs of V. tach on telemetry.  No vomiting, chest pain or diarrhea.  She said she had some palpitations.  Objective:    Vitals:   03/20/19 0730 03/20/19 0745 03/20/19 0749 03/20/19 0800  BP:    (!) 142/44  Pulse: 70 93 86 79  Resp: 19 (!) 33 (!) 23 (!) 32  Temp:      TempSrc:    Axillary  SpO2: (!) 89% (!) 82% 95% 91%  Weight:      Height:        Intake/Output Summary (Last 24 hours) at 03/20/2019 0833 Last data filed at 03/19/2019 1400 Gross per 24 hour  Intake 1289.53 ml  Output 100 ml  Net 1189.53 ml   Filed Weights   03/19/19 1700  Weight: 98.3 kg    Exam:  GEN: NAD (breathing improved after she was placed on oxygen via nonrebreathing mask) SKIN: No rash EYES: EOMI ENT: MMM CV: RRR PULM: Entry adequate bilaterally, no wheezing heard.  She has bibasilar  rales. ABD: soft, obese, NT, +BS CNS: AAO x 3, non focal EXT: No edema or tenderness   Data Reviewed:   I have personally reviewed following labs and imaging studies:  Labs: Labs show the following:   Basic Metabolic Panel: Recent Labs  Lab 03/19/19 1218 03/20/19 0318  NA 131* 136  K 4.1 4.3  CL 99 104  CO2 18* 21*  GLUCOSE 202* 79  BUN 46* 36*   CREATININE 1.65* 1.23*  CALCIUM 7.6* 7.6*   GFR Estimated Creatinine Clearance: 45.9 mL/min (A) (by C-G formula based on SCr of 1.23 mg/dL (H)). Liver Function Tests: Recent Labs  Lab 03/19/19 1218 03/20/19 0318  AST 71* 56*  ALT 32 28  ALKPHOS 54 52  BILITOT 0.8 0.6  PROT 6.6 6.3*  ALBUMIN 3.0* 2.7*   No results for input(s): LIPASE, AMYLASE in the last 168 hours. No results for input(s): AMMONIA in the last 168 hours. Coagulation profile No results for input(s): INR, PROTIME in the last 168 hours.  CBC: Recent Labs  Lab 03/19/19 1218 03/20/19 0318  WBC 15.4* 15.8*  NEUTROABS 14.1* 14.2*  HGB 10.0* 9.7*  HCT 31.2* 29.7*  MCV 85.7 85.3  PLT 147* 168   Cardiac Enzymes: No results for input(s): CKTOTAL, CKMB, CKMBINDEX, TROPONINI in the last 168 hours. BNP (last 3 results) No results for input(s): PROBNP in the last 8760 hours. CBG: Recent Labs  Lab 03/19/19 1653 03/19/19 2013 03/20/19 0139 03/20/19 0353  GLUCAP 197* 211* 119* 84   D-Dimer: No results for input(s): DDIMER in the last 72 hours. Hgb A1c: Recent Labs    03/19/19 1527  HGBA1C 7.4*   Lipid Profile: Recent Labs    03/19/19 1218  TRIG 105   Thyroid function studies: No results for input(s): TSH, T4TOTAL, T3FREE, THYROIDAB in the last 72 hours.  Invalid input(s): FREET3 Anemia work up: Recent Labs    03/19/19 1218 03/20/19 0318  FERRITIN 130 138   Sepsis Labs: Recent Labs  Lab 03/19/19 1218 03/19/19 1502 03/20/19 0318  PROCALCITON 0.38  --   --   WBC 15.4*  --  15.8*  LATICACIDVEN 3.5* 2.9*  --     Microbiology Recent Results (from the past 240 hour(s))  Blood culture (routine x 2)     Status: None (Preliminary result)   Collection Time: 03/19/19  3:01 PM   Specimen: BLOOD  Result Value Ref Range Status   Specimen Description BLOOD BLOOD LEFT FOREARM  Final   Special Requests   Final    BOTTLES DRAWN AEROBIC AND ANAEROBIC Blood Culture adequate volume   Culture    Final    NO GROWTH < 24 HOURS Performed at [facility], [address] [place], [place] [address]    Report Status PENDING  Incomplete  Blood culture (routine x 2)     Status: None (Preliminary result)   Collection Time: 03/19/19  3:01 PM   Specimen: BLOOD  Result Value Ref Range Status   Specimen Description BLOOD LEFT ANTECUBITAL  Final   Special Requests   Final    BOTTLES DRAWN AEROBIC AND ANAEROBIC Blood Culture adequate volume   Culture   Final    NO GROWTH < 24 HOURS Performed at [facility], [address], [place], [place] [address]    Report Status PENDING  Incomplete    Procedures and diagnostic studies:  DG Chest 2 View  Result Date: 03/19/2019 CLINICAL DATA:  74 year old female with shortness of breath. Recent diagnosis  of COVID-19. EXAM: CHEST - 2 VIEW COMPARISON:  Chest x-ray 01/05/2011. FINDINGS: Patchy asymmetrically distributed opacities and areas of interstitial prominence throughout the lungs bilaterally (left greater than right), concerning for multilobar pneumonia bilateral. No pleural effusions. No pneumothorax. No evidence of pulmonary edema. Heart size is normal. Upper mediastinal contours are within normal limits. IMPRESSION: 1. Severe multilobar bilateral pneumonia, as above. Electronically Signed   By: Vinnie Langton M.D.   On: 03/19/2019 11:56   US Venous Img Lower Bilateral (DVT)  Result Date: 03/19/2019 CLINICAL DATA:  Dyspnea.  COVID-19 positive.  Inpatient. EXAM: BILATERAL LOWER EXTREMITY VENOUS DOPPLER ULTRASOUND TECHNIQUE: Gray-scale sonography with compression, as well as color and duplex ultrasound, were performed to evaluate the deep venous system(s) from the level of the common femoral vein through the popliteal and proximal calf veins. COMPARISON:  None. FINDINGS: VENOUS Normal compressibility of the common femoral, superficial femoral, and popliteal veins, as well as the visualized calf veins. Visualized portions of profunda  femoral vein and great saphenous vein unremarkable. No filling defects to suggest DVT on grayscale or color Doppler imaging. Doppler waveforms show normal direction of venous flow, normal respiratory phasicity and response to augmentation. Limited views of the contralateral common femoral vein are unremarkable. OTHER None. Limitations: none IMPRESSION: No femoropopliteal DVT nor evidence of DVT within the visualized calf veins in either lower extremity. If clinical symptoms are inconsistent or if there are persistent or worsening symptoms, further imaging (possibly involving the iliac veins) may be warranted. Electronically Signed   By: Ilona Sorrel M.D.   On: 03/19/2019 18:18    Medications:   . calcium acetate  667 mg Oral BID  . cholecalciferol  1,000 Units Oral Daily  . citalopram  40 mg Oral Daily  . cloNIDine  0.1 mg Oral TID  . dexamethasone (DECADRON) injection  6 mg Intravenous Daily  . enoxaparin (LOVENOX) injection  40 mg Subcutaneous Q24H  . fluticasone  2 spray Each Nare Daily  . insulin aspart  0-15 Units Subcutaneous Q4H  . insulin glargine  40 Units Subcutaneous QHS  . ivermectin  150 mcg/kg (Ideal) Oral Daily  . levothyroxine  175 mcg Oral Q0600  . pantoprazole  40 mg Oral Daily  . vitamin B-12  1,000 mcg Oral Daily   Continuous Infusions: . remdesivir 100 mg in NS 100 mL       LOS: 1 day   Tyshan Enderle  [facility]   *Please refer to [email or web address], password [email or web address] to get updated schedule on who will round on this patient, as hospitalists switch teams weekly. If 7PM-7AM, please contact night-coverage at [email or web address], password [email or web address] for any overnight needs.  03/20/2019, 8:33 AM

## 2019-03-20 NOTE — Progress Notes (Signed)
Initial Nutrition Assessment  DOCUMENTATION CODES:   Obesity unspecified  INTERVENTION:   Nepro Shake po TID, each supplement provides 425 kcal and 19 grams protein  MVI daily  Liberalize diet   NUTRITION DIAGNOSIS:   Increased nutrient needs related to catabolic illness(Cancer, cirrhosis, COVID 19) as evidenced by increased estimated needs.  GOAL:   Patient will meet greater than or equal to 90% of their needs  MONITOR:   PO intake, Supplement acceptance, Labs, Weight trends, Skin, I & O's  REASON FOR ASSESSMENT:   Consult Assessment of nutrition requirement/status  ASSESSMENT:   74 y.o. 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 Pt with increased estimated needs r/t cancer, cirrhosis and COVID 19. Suspect pt with poor appetite and oral intake pta r/t COVID 19. RD will add supplements and vitamins to help pt meet her estimated needs. RD will also liberalize the heart healthy portion of pt's diet as this is restrictive of protein. Per chart, pt appears weight stable at baseline; pt has lost 2lbs(1%) over the past week which is not significant.    Medications reviewed and include: vitamin C, phoslo, D3, celexa, dexamethasone, lasix, lovenox, insulin, synthroid, B12, protonix  Labs reviewed: K 4.3 wnl, BUN 26(H), creat 1.23(H) Wbc- 15.8(H), Hgb 9.7(L), Hct 29.7(L) cbgs- 119, 84, 70 x 24 hrs AIC 7.4(H)- 1/21  Unable to complete Nutrition-Focused physical exam at this time as pt with COVID 19.   Diet Order:   Diet Order            Diet heart healthy/carb modified Room service appropriate? Yes; Fluid consistency: Thin  Diet effective now             EDUCATION NEEDS:    Education needs have been addressed  Skin:  Skin Assessment: Reviewed RN Assessment  Last BM:  Pta  Height:   Ht Readings from Last 1 Encounters:  03/19/19 5\' 5"  (1.651 m)    Weight:   Wt Readings from Last 1 Encounters:  03/19/19 98.3 kg    Ideal Body Weight:  56.8 kg  BMI:  Body mass index is 36.06 kg/m.  Estimated Nutritional Needs:   Kcal:  2000-2300kcal/day  Protein:  100-115g/day  Fluid:  1.4-1.7L/day  Koleen Distance MS, RD, LDN Pager #- [phone] Office#- [phone] After Hours Pager: [phone]

## 2019-03-20 NOTE — Progress Notes (Signed)
Family member notified of pt transfer.

## 2019-03-20 NOTE — Progress Notes (Signed)
This is a COVID Pt. I was suited in full protective gear to enter the room, with assistance from one of the nurses. I entered as the nurse was working with the patient, she had finished eating her lunch. I introduced myself and we started to chat. She began to tell me of her faith and just how grateful she was to God and His goodness toward her. She named off numerous types of cancer's she had been diagnosed with and have live through. She mentioned that her daughter was an Therapist, sports and head of a particular Department at the clinic. She share how well she had been cared for while here and that she would be cared for once discharged. I prayed for her, I also assured her that instead of me entering to minister to her, she had ministered to me instead by sharing her faith. We had a wonderful conversation during the visit.

## 2019-03-20 NOTE — Progress Notes (Addendum)
Spoke with NP on call [place] on secured chat who is aware of situation and will come see pt

## 2019-03-20 NOTE — Evaluation (Signed)
Physical Therapy Evaluation Patient Details Name: Rebecca Kirby MRN: [id number] DOB: 06-02-1945 Today's Date: 03/20/2019   History of Present Illness  Per MD notes: Pt is a 74 year old female with a history of chronic anemia, anxiety disorder, history of colon CA, GERD, hypothyroidism renal cell carcinoma history of thyroid CA, pancreatic CA status post Whipple's procedure, diabetes, B TKA, hernia came into the ED due to worsening hypoxemia post COVID-19 infection.   MD assessment includes acute hypoxemic respiratory failure due to COVID19 induced bilateral multifocal pneumonia.    Clinical Impression  Pt pleasant and motivated to participate throughout the session.  Pt's resting SpO2 on HFNC was in the mid 90's with pt reporting no pain or other adverse symptoms.  Pt progressed slowly with therex per below with gradually increasing intensity with SpO2 remaining in the 90s throughout.  During amb pt was steady without LOB with SpO2 dropping to a low of 88% but increasing quickly back to the low 90s upon sitting.  Pt reported no SOB with amb although telemetry showed RR in the high 30s to low 40s with amb returning to the 20s quickly upon returning to sitting.  Pt will benefit from HHPT services upon discharge to safely address deficits listed in patient problem list for decreased caregiver assistance and eventual return to PLOF.    Follow Up Recommendations Home health PT;Supervision for mobility/OOB    Equipment Recommendations  None recommended by PT    Recommendations for Other Services       Precautions / Restrictions Precautions Precautions: Fall Restrictions Weight Bearing Restrictions: No Other Position/Activity Restrictions: Per MD notes, OK to participate with PT/OT with SpO2 >80%.      Mobility  Bed Mobility Overal bed mobility: Modified Independent             General bed mobility comments: Extra time and effort required  Transfers Overall transfer level: Needs  assistance Equipment used: Rolling walker (2 wheeled) Transfers: Sit to/from Stand Sit to Stand: Supervision         General transfer comment: Good concentric control and fair eccentric control  Ambulation/Gait Ambulation/Gait assistance: Supervision Gait Distance (Feet): 15 Feet Assistive device: Rolling walker (2 wheeled) Gait Pattern/deviations: Step-through pattern;Decreased step length - right;Decreased step length - left Gait velocity: decreased   General Gait Details: Forwards/backwards and sidestepping at EOB with pt steady without LOB; SpO2 dropped to a low of 88% with amb quickly returning to the low 90s upon sitting  Stairs            Wheelchair Mobility    Modified Rankin (Stroke Patients Only)       Balance Overall balance assessment: Needs assistance   Sitting balance-Leahy Scale: Good     Standing balance support: Bilateral upper extremity supported Standing balance-Leahy Scale: Good                               Pertinent Vitals/Pain Pain Assessment: No/denies pain    Home Living Family/patient expects to be discharged to:: Private residence Living Arrangements: Alone Available Help at Discharge: Family;Available 24 hours/day Type of Home: Mobile home Home Access: Stairs to enter Entrance Stairs-Rails: Right;Left;Can reach both Entrance Stairs-Number of Steps: 3 Home Layout: One level Home Equipment: Clinical cytogeneticist - 2 wheels;Bedside commode;Transport chair      Prior Function Level of Independence: Independent         Comments: Pt Ind with amb community distances without an AD, no  fall history, Ind with ADLs     Hand Dominance        Extremity/Trunk Assessment   Upper Extremity Assessment Upper Extremity Assessment: Defer to OT evaluation    Lower Extremity Assessment Lower Extremity Assessment: Generalized weakness       Communication   Communication: No difficulties  Cognition Arousal/Alertness:  Awake/alert Behavior During Therapy: WFL for tasks assessed/performed Overall Cognitive Status: Within Functional Limits for tasks assessed                                        General Comments      Exercises Total Joint Exercises Ankle Circles/Pumps: AROM;Strengthening;Both;10 reps Quad Sets: Strengthening;Both;10 reps Gluteal Sets: Strengthening;Both;10 reps Heel Slides: Strengthening;Both;10 reps Hip ABduction/ADduction: Strengthening;Both;10 reps Long Arc Quad: Strengthening;Both;10 reps Knee Flexion: Strengthening;Both;10 reps Marching in Standing: AROM;Both;10 reps;Standing   Assessment/Plan    PT Assessment Patient needs continued PT services  PT Problem List Decreased strength;Decreased activity tolerance;Decreased balance;Decreased mobility;Cardiopulmonary status limiting activity       PT Treatment Interventions DME instruction;Gait training;Stair training;Functional mobility training;Therapeutic activities;Therapeutic exercise;Balance training;Patient/family education    PT Goals (Current goals can be found in the Care Plan section)  Acute Rehab PT Goals Patient Stated Goal: To get stronger and return home PT Goal Formulation: With patient Time For Goal Achievement: 04/02/19 Potential to Achieve Goals: Good    Frequency Min 2X/week   Barriers to discharge        Co-evaluation               AM-PAC PT "6 Clicks" Mobility  Outcome Measure Help needed turning from your back to your side while in a flat bed without using bedrails?: A Little Help needed moving from lying on your back to sitting on the side of a flat bed without using bedrails?: A Little Help needed moving to and from a bed to a chair (including a wheelchair)?: A Little Help needed standing up from a chair using your arms (e.g., wheelchair or bedside chair)?: A Little Help needed to walk in hospital room?: A Little Help needed climbing 3-5 steps with a railing? : A Lot 6  Click Score: 17    End of Session Equipment Utilized During Treatment: Oxygen Activity Tolerance: Patient tolerated treatment well Patient left: in bed;with call bell/phone within reach;with nursing/sitter in room Nurse Communication: Mobility status PT Visit Diagnosis: Difficulty in walking, not elsewhere classified (R26.2);Muscle weakness (generalized) (M62.81)    Time: [id number] PT Time Calculation (min) (ACUTE ONLY): 55 min   Charges:   PT Evaluation $PT Eval Moderate Complexity: 1 Mod PT Treatments $Therapeutic Exercise: 8-22 mins $Therapeutic Activity: 8-22 mins        D. Royetta Asal PT, DPT 03/20/19, 12:24 PM

## 2019-03-20 NOTE — Significant Event (Signed)
Rapid Response Event Note  Overview:   Event Type: Respiratory, Cardiac  Initial Focused Assessment: Arrived in patient's room with patient turned fully on left side. Per report from [employer] (patient's RN) patient had increasing oxygen demands overnight. No shortness of breath reported by patient but patient requiring increasing levels of oxygen to maintain oxygen saturation goals. Patient finally had to be placed on 15L HFNC and 15L nonbreather. Patient unable to fully prone due to previous abdominal surgeries. Staff on 1C observed Vtach with patient just prior to calling rapid response. Patient reports no palpitations felt  Patient alert and oriented. Oxygen saturation in mid to upper 90s on 15L HFNC and 15L NRB. RR unlabored by tachpenic in mid 55s. No reported shortness of breath. HR stable with no ectopy in the 70s NSR per this RN's observation at bedside. BP with SBP 140s. Potassium 4.3 on morning labs.   Interventions: none.   Plan of Care (if not transferred): Patient to transfer to ICU 5. Patient stable on 15L nasal cannula and 15 L non-rebreather. Left to go prep ICU 5 to receive patient.  Event Summary: Name of Physician Notified: Dr. Mal Misty at      at    Outcome: Transferred (Comment)(ICU 5)     [place], [place]

## 2019-03-20 NOTE — Progress Notes (Signed)
Patient transferred from 1C this shift. Patient alert and oriented all shift, reported no shortness of breath but did report that she felt weak. Able to move self in bed. Initially brought up on non-rebreather mask at 15 L and HFNC at 15 L to maintain oxygen saturations. Tried patient on just 15 L HFNC to allow for PO intake but patient unable to maintain oxygen saturations goals on that (she reported that she typically breathes mostly through her mouth). On non-rebreather mask at 15L patient's oxygen saturations mostly mid 90s.

## 2019-03-20 NOTE — Progress Notes (Deleted)
This was my first visit with this precious 74yo woman. Gerald Stabs informed me of her code and reviving measures that occurred yesterday. Upon entering the dark room, she first stated that she was cold. That was my first task to find her some blankets. I introduced myself and a conversation started immediately. She had not touched her meal on the cart at bedside. She stated she had no appetite. My concern was she couldn't get to it. I asked her and she kept refusing. I asked her to drink a bit of tea , she agreed and I fixed the lemon and sweetner in it and held it for her to drink. I asked about her fa,ily and she shared she has three living daughters, and two have passed away. She has twin boys and a third son whose name is "Hall Busing", I said wow what an interesting name, why did you name him that. She told the story of coming to the hospital in a storm to have him so instead of naming him "Stormy", she chose " [place]". She followed up by saying he is was and is a slow Landscape architect. He apparently lives with her but lives with his brother now so someone can take care of him, sharing he was not capable of caring for himself. I told her how admirable that was, it shows she raised them to do such. This opened up another conversation about families. She does show signs of short term memory loss, she talked about her grandson who had a girlfriend name Clarene Critchley after she asked my name again. She shared he left for [facility] after  [place] and he and this girl broke up. He also called her last night. While there she complained about soreness in her chest. I explained that it was more than likely from the doctor's administering CPR to revive her. She mentioned it more than once how grateful she was to be alive. She talked about how well her family takes care of her, the son picks her up to go to the store when needed, she stated they are taking care of her sometimes when she don;tbwant them to. She smiled a lot and stated she was  glad I was there. The television was not on when I came in but I asked her about it and she allowed me to turn it on. I asked her what did she like to watch, I said maybe Lujean Rave, she said no I like scary movies. That was another conversation of she and I not being able to watch TV together because I would be scared. She laughed about that. I left her as the Doctor entered and assured her I would be back. She was glad to hear that stating it would give her someone to talk to. Over all this was such a sweet visit for me and I believe the same for her. I prayed for her and was able to spend some quality time with patient.

## 2019-03-20 NOTE — Progress Notes (Signed)
*  PRELIMINARY RESULTS* Echocardiogram 2D Echocardiogram has been performed.  Rebecca Kirby 03/20/2019, 8:48 AM

## 2019-03-20 NOTE — Progress Notes (Signed)
Pt was had oxygen saturation in the 80-89% range. Pt was on 14L high flow nasal cannula. Pt was tachypnic. Charge nurse notified. Attempted to prone pt. Pt unable to tolerate. Pt had a few beats of Vtach. Md notified. Rapid response called. New orders received to transfer pt to ICU.

## 2019-03-20 NOTE — Evaluation (Signed)
Occupational Therapy Evaluation Patient Details Name: Rebecca Kirby MRN: [id number] DOB: 18-Apr-1945 Today's Date: 03/20/2019    History of Present Illness Per MD notes: Pt is a 74 year old female with a history of chronic anemia, anxiety disorder, history of colon CA, GERD, hypothyroidism renal cell carcinoma history of thyroid CA, pancreatic CA status post Whipple's procedure, diabetes, B TKA, hernia came into the ED due to worsening hypoxemia post COVID-19 infection.   MD assessment includes acute hypoxemic respiratory failure due to COVID19 induced bilateral multifocal pneumonia.   Clinical Impression   Pt seen for OT evaluation this date. Pt was independent in all ADL and functional mobility, living by herself and no home O2 use prior to recent Covid-19 infection. Pt now reports becoming easily fatigued or out of breath with minimal exertion. Pt currently requires supervision to CGA for ADL tasks involving sit to stand transfers and additional time/effort to perform functional mobility and ADL 2/2 cardiopulmonary status and generalized weakness. Pt educated in energy conservation strategies including pursed lip breathing, activity pacing, home/routines modifications, work simplification, AE/DME, prioritizing of meaningful occupations, and falls prevention. Handout provided. Pt verbalized understanding and would benefit from additional skilled OT services to maximize recall and carryover of learned techniques and facilitate implementation of learned techniques into daily routines. Upon discharge, recommend [facility] services.      Follow Up Recommendations  Home health OT    Equipment Recommendations  None recommended by OT    Recommendations for Other Services       Precautions / Restrictions Precautions Precautions: Fall Precaution Comments: watch O2 sats Restrictions Weight Bearing Restrictions: No Other Position/Activity Restrictions: Per MD notes, OK to participate with PT/OT with  SpO2 >80%.      Mobility Bed Mobility Overal bed mobility: Modified Independent             General bed mobility comments: Extra time and effort required  Transfers Overall transfer level: Needs assistance Equipment used: Rolling walker (2 wheeled) Transfers: Sit to/from Stand Sit to Stand: Supervision         General transfer comment: Good concentric control and fair eccentric control    Balance Overall balance assessment: Needs assistance   Sitting balance-Leahy Scale: Good     Standing balance support: Bilateral upper extremity supported Standing balance-Leahy Scale: Good                             ADL either performed or assessed with clinical judgement   ADL Overall ADL's : Needs assistance/impaired                                       General ADL Comments: supervision to CGA for ADL tasks involving functional transfers, becomes easily fatigued     Vision Baseline Vision/History: Wears glasses Wears Glasses: At all times Patient Visual Report: No change from baseline       Perception     Praxis      Pertinent Vitals/Pain Pain Assessment: No/denies pain     Hand Dominance Right   Extremity/Trunk Assessment Upper Extremity Assessment Upper Extremity Assessment: Generalized weakness   Lower Extremity Assessment Lower Extremity Assessment: Generalized weakness   Cervical / Trunk Assessment Cervical / Trunk Assessment: Normal   Communication Communication Communication: No difficulties   Cognition Arousal/Alertness: Awake/alert Behavior During Therapy: WFL for tasks assessed/performed Overall Cognitive Status: Within Functional Limits  for tasks assessed                                     General Comments       Exercises Other Exercises Other Exercises: Pt instructed in energy conservation strategies for ADL and IADL Tasks with handout provided   Shoulder Instructions      Home Living  Family/patient expects to be discharged to:: Private residence Living Arrangements: Alone Available Help at Discharge: Family;Available 24 hours/day Type of Home: Mobile home Home Access: Stairs to enter Entrance Stairs-Number of Steps: 3 Entrance Stairs-Rails: Right;Left;Can reach both Home Layout: One level     Bathroom Shower/Tub: Tub/shower unit;Walk-in shower         Home Equipment: Clinical cytogeneticist - 2 wheels;Bedside commode;Transport chair;Grab bars - tub/shower;Hand held shower head          Prior Functioning/Environment Level of Independence: Independent        Comments: Pt Ind with amb community distances without an AD, no fall history, Ind with ADLs        OT Problem List: Decreased activity tolerance;Decreased strength;Cardiopulmonary status limiting activity      OT Treatment/Interventions: Self-care/ADL training;Therapeutic exercise;Therapeutic activities;Energy conservation;DME and/or AE instruction;Patient/family education    OT Goals(Current goals can be found in the care plan section) Acute Rehab OT Goals Patient Stated Goal: To get stronger and return home OT Goal Formulation: With patient Time For Goal Achievement: 04/03/19 Potential to Achieve Goals: Good ADL Goals Additional ADL Goal #1: Pt will perform seated shower with set up and supervision, O2 sats >85% throughout. Additional ADL Goal #2: Pt will verbalize plan to implement at least 2 learned energy conservation strategies to support recovery and return to PLOF.  OT Frequency: Min 1X/week   Barriers to D/C:            Co-evaluation              AM-PAC OT "6 Clicks" Daily Activity     Outcome Measure Help from another person eating meals?: None Help from another person taking care of personal grooming?: None Help from another person toileting, which includes using toliet, bedpan, or urinal?: A Little Help from another person bathing (including washing, rinsing, drying)?: A  Little Help from another person to put on and taking off regular upper body clothing?: None Help from another person to put on and taking off regular lower body clothing?: A Little 6 Click Score: 21   End of Session    Activity Tolerance: Patient tolerated treatment well Patient left: in bed;with call bell/phone within reach;with bed alarm set;with nursing/sitter in room  OT Visit Diagnosis: Other abnormalities of gait and mobility (R26.89);Muscle weakness (generalized) (M62.81)                Time: [id number] OT Time Calculation (min): 37 min Charges:  OT General Charges $OT Visit: 1 Visit OT Evaluation $OT Eval Low Complexity: 1 Low OT Treatments $Self Care/Home Management : 23-37 mins  Jeni Salles, MPH, MS, OTR/L ascom [phone] 03/20/19, 4:41 PM

## 2019-03-20 NOTE — Progress Notes (Signed)
Report given to Judson Roch, RN to icu 5.

## 2019-03-21 ENCOUNTER — Inpatient Hospital Stay: Payer: Medicare Other

## 2019-03-21 LAB — CBC WITH DIFFERENTIAL/PLATELET
Abs Immature Granulocytes: 0.09 10*3/uL — ABNORMAL HIGH (ref 0.00–0.07)
Basophils Absolute: 0 10*3/uL (ref 0.0–0.1)
Basophils Relative: 0 %
Eosinophils Absolute: 0 10*3/uL (ref 0.0–0.5)
Eosinophils Relative: 0 %
HCT: 31.4 % — ABNORMAL LOW (ref 36.0–46.0)
Hemoglobin: 10 g/dL — ABNORMAL LOW (ref 12.0–15.0)
Immature Granulocytes: 1 %
Lymphocytes Relative: 7 %
Lymphs Abs: 0.6 10*3/uL — ABNORMAL LOW (ref 0.7–4.0)
MCH: 27.3 pg (ref 26.0–34.0)
MCHC: 31.8 g/dL (ref 30.0–36.0)
MCV: 85.8 fL (ref 80.0–100.0)
Monocytes Absolute: 0.4 10*3/uL (ref 0.1–1.0)
Monocytes Relative: 4 %
Neutro Abs: 8 10*3/uL — ABNORMAL HIGH (ref 1.7–7.7)
Neutrophils Relative %: 88 %
Platelets: 194 10*3/uL (ref 150–400)
RBC: 3.66 MIL/uL — ABNORMAL LOW (ref 3.87–5.11)
RDW: 14.3 % (ref 11.5–15.5)
WBC: 9.1 10*3/uL (ref 4.0–10.5)
nRBC: 0 % (ref 0.0–0.2)

## 2019-03-21 LAB — HEPATIC FUNCTION PANEL
ALT: 33 U/L (ref 0–44)
AST: 60 U/L — ABNORMAL HIGH (ref 15–41)
Albumin: 2.8 g/dL — ABNORMAL LOW (ref 3.5–5.0)
Alkaline Phosphatase: 61 U/L (ref 38–126)
Bilirubin, Direct: 0.1 mg/dL (ref 0.0–0.2)
Indirect Bilirubin: 0.6 mg/dL (ref 0.3–0.9)
Total Bilirubin: 0.7 mg/dL (ref 0.3–1.2)
Total Protein: 6.4 g/dL — ABNORMAL LOW (ref 6.5–8.1)

## 2019-03-21 LAB — PHOSPHORUS: Phosphorus: 5 mg/dL — ABNORMAL HIGH (ref 2.5–4.6)

## 2019-03-21 LAB — BASIC METABOLIC PANEL
Anion gap: 14 (ref 5–15)
BUN: 38 mg/dL — ABNORMAL HIGH (ref 8–23)
CO2: 21 mmol/L — ABNORMAL LOW (ref 22–32)
Calcium: 7.7 mg/dL — ABNORMAL LOW (ref 8.9–10.3)
Chloride: 101 mmol/L (ref 98–111)
Creatinine, Ser: 1.23 mg/dL — ABNORMAL HIGH (ref 0.44–1.00)
GFR calc Af Amer: 50 mL/min — ABNORMAL LOW (ref >60)
GFR calc non Af Amer: 43 mL/min — ABNORMAL LOW (ref >60)
Glucose, Bld: 183 mg/dL — ABNORMAL HIGH (ref 70–99)
Potassium: 4.6 mmol/L (ref 3.5–5.1)
Sodium: 136 mmol/L (ref 135–145)

## 2019-03-21 LAB — MAGNESIUM: Magnesium: 1.9 mg/dL (ref 1.7–2.4)

## 2019-03-21 LAB — GLUCOSE, CAPILLARY
Glucose-Capillary: 106 mg/dL — ABNORMAL HIGH (ref 70–99)
Glucose-Capillary: 128 mg/dL — ABNORMAL HIGH (ref 70–99)
Glucose-Capillary: 182 mg/dL — ABNORMAL HIGH (ref 70–99)
Glucose-Capillary: 193 mg/dL — ABNORMAL HIGH (ref 70–99)
Glucose-Capillary: 212 mg/dL — ABNORMAL HIGH (ref 70–99)
Glucose-Capillary: 95 mg/dL (ref 70–99)

## 2019-03-21 MED ORDER — FUROSEMIDE 10 MG/ML IJ SOLN
20.0000 mg | Freq: Every day | INTRAMUSCULAR | Status: DC
  Administered 2019-03-22: 20 mg via INTRAVENOUS
  Filled 2019-03-21: qty 2

## 2019-03-21 NOTE — Progress Notes (Signed)
CRITICAL CARE PROGRESS NOTE    Name: Rebecca Kirby MRN: [id number] DOB: 1945-09-06     LOS: 2   SUBJECTIVE FINDINGS & SIGNIFICANT EVENTS   Patient description:   This is a very pleasant 74 year old female with a history of chronic anemia, anxiety disorder, history of colon CA, GERD, hypothyroidism renal cell carcinoma history of thyroid CA, pancreatic CA status post Whipple's procedure, diabetes, came into the ED due to worsening hypoxemia post COVID-19 infection.  Patient has been positive for COVID-19 since March 11, 2019 has initially been relatively minimally symptomatic with prednisone taper and Zithromax prescribed by primary care doctor on the 15th.  Over the last 48 hours patient had also developed fevers and loose stools T-max at 101.9 and again had similar fever and diarrhea today.  Her oxygen saturation started to get worse noted to be at approximately 78% on room air.  Patient generally does not like to go to the hospital and has initially declined to come in however her daughter was insisting that she comes in and get medical attention.  In the ED she was noted to be 85% on 2 L which improved with increased supplemental oxygen satting at approximately 90% on 5 to 6 L/min nasal cannula.  Chest x-ray was reviewed independently by me which is consistent with multilobar bilateral airspace opacification consistent with COVID-19 induced pneumonia.  In the ED she received her first dose of remdesivir as well as resuscitative IV fluids.  During my examination patient does not appear to be in distress he is nontachycardic with mild tachypnea respiratory rate ~25.  She had high-sensitivity troponins done today with a mild elevation at 29 and trending down to 27 with repeat.  Blood sugars have been somewhat elevated with  most recent at 197.  Procalcitonin is mildly elevated 0.38 as well as mild elevation BNP at 139.    Lines / Drains: PIVx2  Cultures / Sepsis markers: Respiratory culture -in progress MRSA PCT - negative Blood culture x2 - NTDx24h -reincubation in progress  Antibiotics: zithromax 500 PO daily    Protocols / Consultants: PCCM Hospitalist  Tests / Events: [place] - Lower extermities - negative for DVT bilaterally   Overnight: Patient with worsening hypoxemia requiring NRB transferred to MICU for closer monitoring and further management.    PAST MEDICAL HISTORY   Past Medical History:  Diagnosis Date  . Anemia   . Anxiety   . Colon cancer ([place])   . Diabetes mellitus without complication ([place])   . Family history of adverse reaction to anesthesia    daughter had reaction to anesthesia and was in icu  . GERD (gastroesophageal reflux disease)   . Hypertension   . Hypothyroidism   . 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 Pancreas cancer ([place]) 2018  . Thyroid cancer [facility])      SURGICAL HISTORY   Past Surgical History:  Procedure Laterality Date  . CHOLECYSTECTOMY    . COLON RESECTION    . COLONOSCOPY WITH PROPOFOL N/A 09/01/2015   Procedure: COLONOSCOPY WITH PROPOFOL;  Surgeon: Manya Silvas, MD;  Location: [facility] ENDOSCOPY;  Service: Endoscopy;  Laterality: N/A;  . CYSTOSCOPY  12/28/2016   Procedure: CYSTOSCOPY;  Surgeon: Ward, Honor Loh, MD;  Location: [facility] ORS;  Service: Gynecology;;  . ESOPHAGOGASTRODUODENOSCOPY (EGD) WITH PROPOFOL N/A 09/01/2015   Procedure: ESOPHAGOGASTRODUODENOSCOPY (EGD) WITH PROPOFOL;  Surgeon: Manya Silvas, MD;  Location: [facility] ENDOSCOPY;  Service: Endoscopy;  Laterality: N/A;  . EUS N/A 06/28/2016   Procedure: FULL UPPER ENDOSCOPIC ULTRASOUND (EUS) RADIAL;  Surgeon: Jola Schmidt, MD;  Location: [facility] ENDOSCOPY;  Service: Endoscopy;  Laterality: N/A;  . EXCISION MASS NECK N/A 09/20/2015    Procedure: EXCISION MASS NECK;  Surgeon: Beverly Gust, MD;  Location: [place];  Service: ENT;  Laterality: N/A;  . LAPAROSCOPIC BILATERAL SALPINGO OOPHERECTOMY Bilateral 12/28/2016   Procedure: LAPAROSCOPIC BILATERAL SALPINGO OOPHORECTOMY;  Surgeon: Ward, Honor Loh, MD;  Location: [facility] ORS;  Service: Gynecology;  Laterality: Bilateral;  . LAPAROSCOPIC HYSTERECTOMY N/A 12/28/2016   Procedure: HYSTERECTOMY TOTAL LAPAROSCOPIC;  Surgeon: Ward, Honor Loh, MD;  Location: [facility] ORS;  Service: Gynecology;  Laterality: N/A;  . LAPAROSCOPIC LYSIS OF ADHESIONS  12/28/2016   Procedure: LAPAROSCOPIC EXTENSIVE LYSIS OF ADHESIONS;  Surgeon: Ward, Honor Loh, MD;  Location: [facility] ORS;  Service: Gynecology;;  . rt kidney removed Right   . THYROGLOSSAL DUCT CYST Right 09/20/2015   Procedure: excision sub mandibular gland right;  Surgeon: Beverly Gust, MD;  Location: [place];  Service: ENT;  Laterality: Right;  . THYROID SURGERY Bilateral   . TOTAL KNEE ARTHROPLASTY Right   . TOTAL KNEE ARTHROPLASTY Left 08/09/2014   Procedure: TOTAL KNEE ARTHROPLASTY;  Surgeon: Dereck Leep, MD;  Location: [facility] ORS;  Service: Orthopedics;  Laterality: Left;     FAMILY HISTORY   Family History  Problem Relation Age of Onset  . Breast cancer Maternal Aunt   . Breast cancer Cousin      SOCIAL HISTORY   Social History   Tobacco Use  . Smoking status: Former Smoker    Quit date: 07/29/1994    Years since quitting: 24.6  . Smokeless tobacco: Never Used  Substance Use Topics  . Alcohol use: No  . Drug use: No     MEDICATIONS   Current Medication:  Current Facility-Administered Medications:  .  acetaminophen (TYLENOL) tablet 650 mg, 650 mg, Oral, Q6H PRN, Jennye Boroughs, MD, 650 mg at 03/20/19 0944 .  ascorbic acid (VITAMIN C) tablet 500 mg, 500 mg, Oral, Daily, Lanney Gins, Calais Svehla, MD, 500 mg at 03/21/19 1140 .  azithromycin (ZITHROMAX) tablet 500 mg, 500 mg, Oral, Daily, Lanney Gins, Jiyah Torpey, MD,  500 mg at 03/21/19 1140 .  calcium acetate (PHOSLO) capsule 667 mg, 667 mg, Oral, BID, Jennye Boroughs, MD, 667 mg at 03/21/19 1141 .  Chlorhexidine Gluconate Cloth 2 % PADS 6 each, 6 each, Topical, Daily, Ottie Glazier, MD, 6 each at 03/20/19 0946 .  cholecalciferol (VITAMIN D3) tablet 1,000 Units, 1,000 Units, Oral, Daily, Jennye Boroughs, MD, 1,000 Units at 03/21/19 1141 .  citalopram (CELEXA) tablet 40 mg, 40 mg, Oral, Daily, Jennye Boroughs, MD, 40 mg at 03/21/19 1139 .  cloNIDine (CATAPRES) tablet 0.1 mg, 0.1 mg, Oral, TID, Jennye Boroughs, MD, 0.1 mg at 03/20/19 2125 .  dexamethasone (DECADRON) injection 6 mg, 6 mg, Intravenous, Q12H, Lanney Gins, Lamondre Wesche, MD, 6 mg at 03/21/19 1142 .  enoxaparin (LOVENOX) injection 40 mg, 40 mg, Subcutaneous, Q24H, Jennye Boroughs, MD, 40 mg at 03/20/19 2123 .  feeding supplement (NEPRO CARB STEADY) liquid 237 mL, 237 mL, Oral, TID BM, Alycea Segoviano, MD .  fluticasone (FLONASE) 50 MCG/ACT nasal spray 2 spray, 2 spray, Each Nare, Daily, Jennye Boroughs, MD, 2 spray at 03/21/19 1144 .  furosemide (LASIX) injection 20 mg, 20 mg, Intravenous, BID, Lanney Gins, Irineo Gaulin, MD, 20 mg at 03/21/19 0900 .  HYDROcodone-acetaminophen (NORCO) 10-325 MG per tablet 1 tablet, 1 tablet, Oral, BID PRN, [place],  Ilona Sorrel, MD .  insulin aspart (novoLOG) injection 0-15 Units, 0-15 Units, Subcutaneous, Q4H, Ottie Glazier, MD, 2 Units at 03/21/19 0911 .  insulin glargine (LANTUS) injection 40 Units, 40 Units, Subcutaneous, QHS, Jennye Boroughs, MD, 40 Units at 03/20/19 2250 .  ivermectin (STROMECTOL) tablet 9,000 mcg, 150 mcg/kg (Ideal), Oral, Daily, Jennye Boroughs, MD, 9,000 mcg at 03/21/19 1138 .  levothyroxine (SYNTHROID) tablet 175 mcg, 175 mcg, Oral, Q0600, Jennye Boroughs, MD, 175 mcg at 03/21/19 0516 .  multivitamin with minerals tablet 1 tablet, 1 tablet, Oral, Daily, Ottie Glazier, MD, 1 tablet at 03/21/19 1138 .  ondansetron (ZOFRAN) tablet 4 mg, 4 mg, Oral, Q6H PRN **OR** ondansetron  (ZOFRAN) injection 4 mg, 4 mg, Intravenous, Q6H PRN, Jennye Boroughs, MD .  Margrett Rud remdesivir 200 mg in sodium chloride 0.9% 250 mL IVPB, 200 mg, Intravenous, Once, Stopped at 03/19/19 1345 **FOLLOWED BY** remdesivir 100 mg in sodium chloride 0.9 % 100 mL IVPB, 100 mg, Intravenous, Daily, Jennye Boroughs, MD, Last Rate: 200 mL/hr at 03/21/19 1202, 100 mg at 03/21/19 1202 .  vitamin B-12 (CYANOCOBALAMIN) tablet 1,000 mcg, 1,000 mcg, Oral, Daily, Jennye Boroughs, MD, 1,000 mcg at 03/21/19 1139    ALLERGIES   Statins    REVIEW OF SYSTEMS     10 point ROS neg except as per sujective findings  PHYSICAL EXAMINATION   Vital Signs: Temp:  [97.8 F (36.6 C)-98.8 F (37.1 C)] 98 F (36.7 C) (01/23 1200) Pulse Rate:  [58-75] 70 (01/23 1300) Resp:  [13-36] 13 (01/23 1300) BP: (77-130)/(42-62) 89/58 (01/23 1200) SpO2:  [87 %-99 %] 88 % (01/23 1300) FiO2 (%):  [100 %] 100 % (01/23 0755)  GENERAL:well nourished HEAD: Normocephalic, atraumatic.  EYES: Pupils equal, round, reactive to light.  No scleral icterus.  MOUTH: Moist mucosal membrane. NECK: Supple. No thyromegaly. No nodules. No JVD.  PULMONARY: bilateral rhonchorous breath sounds with crackles at bases bilatearally CARDIOVASCULAR: S1 and S2. Regular rate and rhythm. No murmurs, rubs, or gallops.  GASTROINTESTINAL: Soft, nontender, non-distended. No masses. Positive bowel sounds. No hepatosplenomegaly.  MUSCULOSKELETAL: No swelling, clubbing, or edema.  NEUROLOGIC: Mild distress due to acute illness SKIN:intact,warm,dry   PERTINENT DATA     Infusions: . remdesivir 100 mg in NS 100 mL 100 mg (03/21/19 1202)   Scheduled Medications: . vitamin C  500 mg Oral Daily  . azithromycin  500 mg Oral Daily  . calcium acetate  667 mg Oral BID  . Chlorhexidine Gluconate Cloth  6 each Topical Daily  . cholecalciferol  1,000 Units Oral Daily  . citalopram  40 mg Oral Daily  . cloNIDine  0.1 mg Oral TID  . dexamethasone  (DECADRON) injection  6 mg Intravenous Q12H  . enoxaparin (LOVENOX) injection  40 mg Subcutaneous Q24H  . feeding supplement (NEPRO CARB STEADY)  237 mL Oral TID BM  . fluticasone  2 spray Each Nare Daily  . furosemide  20 mg Intravenous BID  . insulin aspart  0-15 Units Subcutaneous Q4H  . insulin glargine  40 Units Subcutaneous QHS  . ivermectin  150 mcg/kg (Ideal) Oral Daily  . levothyroxine  175 mcg Oral Q0600  . multivitamin with minerals  1 tablet Oral Daily  . vitamin B-12  1,000 mcg Oral Daily   PRN Medications: acetaminophen, HYDROcodone-acetaminophen, ondansetron **OR** ondansetron (ZOFRAN) IV Hemodynamic parameters:   Intake/Output: 01/22 0701 - 01/23 0700 In: 460 [P.O.:360; IV Piggyback:100] Out: 2450 [Urine:2450]  Ventilator  Settings: FiO2 (%):  [100 %] 100 %  LAB RESULTS:  Basic Metabolic Panel: Recent Labs  Lab 03/19/19 1218 03/19/19 1218 03/20/19 0318 03/21/19 0554  NA 131*  --  136 136  K 4.1   < > 4.3 4.6  CL 99  --  104 101  CO2 18*  --  21* 21*  GLUCOSE 202*  --  79 183*  BUN 46*  --  36* 38*  CREATININE 1.65*  --  1.23* 1.23*  CALCIUM 7.6*  --  7.6* 7.7*  MG  --   --   --  1.9  PHOS  --   --   --  5.0*   < > = values in this interval not displayed.   Liver Function Tests: Recent Labs  Lab 03/19/19 1218 03/20/19 0318 03/21/19 0554  AST 71* 56* 60*  ALT 32 28 33  ALKPHOS 54 52 61  BILITOT 0.8 0.6 0.7  PROT 6.6 6.3* 6.4*  ALBUMIN 3.0* 2.7* 2.8*   No results for input(s): LIPASE, AMYLASE in the last 168 hours. No results for input(s): AMMONIA in the last 168 hours. CBC: Recent Labs  Lab 03/19/19 1218 03/20/19 0318 03/21/19 0554  WBC 15.4* 15.8* 9.1  NEUTROABS 14.1* 14.2* 8.0*  HGB 10.0* 9.7* 10.0*  HCT 31.2* 29.7* 31.4*  MCV 85.7 85.3 85.8  PLT 147* 168 194   Cardiac Enzymes: No results for input(s): CKTOTAL, CKMB, CKMBINDEX, TROPONINI in the last 168 hours. BNP: Invalid input(s): POCBNP CBG: Recent Labs  Lab  03/20/19 1611 03/20/19 2120 03/21/19 0514 03/21/19 0755 03/21/19 1123  GLUCAP 132* 200* 182* 128* 106*     IMAGING RESULTS:  Imaging: US Venous Img Lower Bilateral (DVT)  Result Date: 03/19/2019 CLINICAL DATA:  Dyspnea.  COVID-19 positive.  Inpatient. EXAM: BILATERAL LOWER EXTREMITY VENOUS DOPPLER ULTRASOUND TECHNIQUE: Gray-scale sonography with compression, as well as color and duplex ultrasound, were performed to evaluate the deep venous system(s) from the level of the common femoral vein through the popliteal and proximal calf veins. COMPARISON:  None. FINDINGS: VENOUS Normal compressibility of the common femoral, superficial femoral, and popliteal veins, as well as the visualized calf veins. Visualized portions of profunda femoral vein and great saphenous vein unremarkable. No filling defects to suggest DVT on grayscale or color Doppler imaging. Doppler waveforms show normal direction of venous flow, normal respiratory phasicity and response to augmentation. Limited views of the contralateral common femoral vein are unremarkable. OTHER None. Limitations: none IMPRESSION: No femoropopliteal DVT nor evidence of DVT within the visualized calf veins in either lower extremity. If clinical symptoms are inconsistent or if there are persistent or worsening symptoms, further imaging (possibly involving the iliac veins) may be warranted. Electronically Signed   By: Ilona Sorrel M.D.   On: 03/19/2019 18:18   DG Chest Port 1 View  Result Date: 03/21/2019 CLINICAL DATA:  74 year old female with colon cancer pancreatic cancer status post Whipple, with hypoxemia and known prior COVID infection EXAM: PORTABLE CHEST 1 VIEW COMPARISON:  03/20/2019, 03/19/2019 FINDINGS: Cardiomediastinal silhouette unchanged in size and contour. Similar distribution of mixed reticulonodular opacities throughout the bilateral lungs, with slight improvement in the right lower lung. No pneumothorax or large pleural effusion.  IMPRESSION: Multifocal pneumonia of the bilateral lungs, with persisting reticulonodular opacities. Electronically Signed   By: Corrie Mckusick D.O.   On: 03/21/2019 13:59   DG Chest Port 1 View  Result Date: 03/20/2019 CLINICAL DATA:  Pulmonary disease. COVID-19 pneumonia. Acute hypoxemic respiratory failure. EXAM: PORTABLE CHEST 1 VIEW COMPARISON:  Chest x-ray dated 03/19/2019  FINDINGS: There has been significant progression of the bilateral pulmonary infiltrates. Heart size and pulmonary vascularity are normal. No effusions. No acute bone abnormality. IMPRESSION: Significant progression of bilateral pulmonary infiltrates consistent with COVID pneumonia. Electronically Signed   By: Lorriane Shire M.D.   On: 03/20/2019 10:55   ECHOCARDIOGRAM COMPLETE  Result Date: 03/20/2019   ECHOCARDIOGRAM REPORT   Patient Name:   ROBERT DESIRE Date of Exam: 03/20/2019 Medical Rec #:  [id number]       Height:       65.0 in Accession #:    [id number]      Weight:       216.7 lb Date of Birth:  03-18-1945       BSA:          2.05 m Patient Age:    60 years        BP:           125/74 mmHg Patient Gender: F               HR:           72 bpm. Exam Location:  [facility] Procedure: 2D Echo, Color Doppler and Cardiac Doppler Indications:     I26.99 Pulmonary Embolus  History:         Patient has no prior history of Echocardiogram examinations.                  Risk Factors:Hypertension and Diabetes.  Sonographer:     Charmayne Sheer RDCS (AE) Referring Phys:  [id number] Ottie Glazier Diagnosing Phys: Yolonda Kida MD  Sonographer Comments: Suboptimal subcostal window. IMPRESSIONS  1. Left ventricular ejection fraction, by visual estimation, is 70 to 75%. The left ventricle has normal function. Left ventricular septal wall thickness was normal. Normal left ventricular posterior wall thickness. There is no left ventricular hypertrophy.  2. Left ventricular diastolic parameters are consistent with Grade I diastolic dysfunction (impaired  relaxation).  3. The left ventricle has no regional wall motion abnormalities.  4. Global right ventricle has normal systolic function.The right ventricular size is normal. No increase in right ventricular wall thickness.  5. Left atrial size was normal.  6. Right atrial size was normal.  7. The mitral valve is normal in structure. No evidence of mitral valve regurgitation.  8. The tricuspid valve is normal in structure.  9. The tricuspid valve is normal in structure. Tricuspid valve regurgitation is mild. 10. The aortic valve is normal in structure. Aortic valve regurgitation is not visualized. 11. The pulmonic valve was normal in structure. Pulmonic valve regurgitation is not visualized. 12. Evidence of atrial level shunting detected by color flow Doppler. FINDINGS  Left Ventricle: Left ventricular ejection fraction, by visual estimation, is 70 to 75%. The left ventricle has normal function. The left ventricle has no regional wall motion abnormalities. The left ventricular internal cavity size was the left ventricle is normal in size. Normal left ventricular posterior wall thickness. There is no left ventricular hypertrophy. Left ventricular diastolic parameters are consistent with Grade I diastolic dysfunction (impaired relaxation). Right Ventricle: The right ventricular size is normal. No increase in right ventricular wall thickness. Global RV systolic function is has normal systolic function. Left Atrium: Left atrial size was normal in size. Right Atrium: Right atrial size was normal in size Pericardium: There is no evidence of pericardial effusion. Mitral Valve: The mitral valve is normal in structure. No evidence of mitral valve regurgitation. MV peak gradient, 5.3 mmHg. Tricuspid Valve:  The tricuspid valve is normal in structure. Tricuspid valve regurgitation is mild. Aortic Valve: The aortic valve is normal in structure. Aortic valve regurgitation is not visualized. Aortic valve mean gradient measures 7.0  mmHg. Aortic valve peak gradient measures 13.2 mmHg. Aortic valve area, by VTI measures 2.14 cm. Pulmonic Valve: The pulmonic valve was normal in structure. Pulmonic valve regurgitation is not visualized. Pulmonic regurgitation is not visualized. Aorta: The aortic root is normal in size and structure. IAS/Shunts: Evidence of atrial level shunting detected by color flow Doppler.  LEFT VENTRICLE PLAX 2D LVIDd:         4.59 cm  Diastology LVIDs:         2.49 cm  LV e' lateral:   6.96 cm/s LV PW:         1.00 cm  LV E/e' lateral: 15.8 LV IVS:        0.70 cm  LV e' medial:    5.22 cm/s LVOT diam:     1.80 cm  LV E/e' medial:  21.1 LV SV:         75 ml LV SV Index:   34.50 LVOT Area:     2.54 cm  RIGHT VENTRICLE RV Basal diam:  2.40 cm LEFT ATRIUM              Index       RIGHT ATRIUM           Index LA diam:        4.60 cm  2.25 cm/m  RA Area:     14.40 cm LA Vol (A2C):   51.7 ml  25.26 ml/m RA Volume:   33.60 ml  16.42 ml/m LA Vol (A4C):   101.0 ml 49.35 ml/m LA Biplane Vol: 73.9 ml  36.11 ml/m  AORTIC VALVE                    PULMONIC VALVE AV Area (Vmax):    1.99 cm     PV Vmax:       1.56 m/s AV Area (Vmean):   2.04 cm     PV Vmean:      105.000 cm/s AV Area (VTI):     2.14 cm     PV VTI:        0.252 m AV Vmax:           182.00 cm/s  PV Peak grad:  9.7 mmHg AV Vmean:          125.000 cm/s PV Mean grad:  5.0 mmHg AV VTI:            0.350 m AV Peak Grad:      13.2 mmHg AV Mean Grad:      7.0 mmHg LVOT Vmax:         142.00 cm/s LVOT Vmean:        100.000 cm/s LVOT VTI:          0.294 m LVOT/AV VTI ratio: 0.84  AORTA Ao Root diam: 3.50 cm MITRAL VALVE MV Area (PHT): 2.80 cm              SHUNTS MV Peak grad:  5.3 mmHg              Systemic VTI:  0.29 m MV Mean grad:  3.0 mmHg              Systemic Diam: 1.80 cm MV Vmax:       1.15 m/s MV Vmean:  76.0 cm/s MV VTI:        0.37 m MV PHT:        78.59 msec MV Decel Time: 271 msec MV E velocity: 110.00 cm/s 103 cm/s MV A velocity: 95.60 cm/s  70.3 cm/s MV E/A  ratio:  1.15        1.5  Dwayne D Callwood MD Electronically signed by Yolonda Kida MD Signature Date/Time: 03/20/2019/12:42:15 PM    Final             ASSESSMENT AND PLAN    -Multidisciplinary rounds held today  Acute hypoxemic respiratory failure  - due to COVID19 induced bilateral multifocal pneumonia -patient is diabetic sensitive to steroids - will require frequent checks- currently set at q4h - Decadron 6mg  IV daily changing to BID today due to worsening infiltrates. -will diurese gently - adding lasix 20 bid - pt with CKD - 03/21/19- now euvolemic , net negative 1500cc decrease lasix to 20 IV daily  -continue Remdesevir mainatance x 5d - bronchopulmonary hygiene with xopenex and incentive spirometer if patient can participate  -Ivermectin 178mcg/kg of ideal body weight - low dose 9mg  p.o. daily - [place] lower - no DVT - lower likelihood of PE will not perform CT chest to avoid contrast to spare renal function -daily hepatic panel - 03/21/19 - stable , albumin incrementing up - nourishment is adequate -Beta-D-Glucan - in process  -appreciate hospitalist team collaboration -switched from NRB to HFNC - oxygenation with slight improvement over past 24h  - serial CXR - improved this am, pictorial documentation above  Multiple comorbid conditions -Hx of RCC, Pancreatic CA, thyroid CA, colon CA -hx of liver cirrosis and CKD - predisposing to hypercoagulable state -currently on Lovenox PPX dose 40 [place] daily will increase to bid while acutely ill.    Essential HTN  I will stop norvasc, bystolic and ibersartan to help decrase hepatoxicity while on remdesevir and ivermectin, in the interim will use low dose clonidine TID to tx HTN and help patients anxious state simultaneously -ICU telemetry monitoring -HR and BP have been in reference range on this regimen and patient states she is not anxious.    Chronic diastilic CHF - Grade I  - strict I&Os  - gentle diuresis in lieu of  CKD  - fluid restriction to 1200cc/24h  -repeat CXR daily    Hypothyroidism -continue home synthroid   Chronic Renal Failure -follow chem 7 -follow UO -continue PureWick external urinary Catheter-assess need daily -patient on diuretic 20 bid Lasix - strict I&Os -limit PO fluid intake to 1200cc/24h -stop non-essential nephrotoxic medications - d/c protonix    ID -continue Zithromax abx as prescibed -follow up cultures  GI/Nutrition GI PROPHYLAXIS as indicated-stopping PPI  DIET-->TF's as tolerated Constipation protocol as indicated  ENDO - ICU hypoglycemic\Hyperglycemia protocol -check FSBS per protocol - currently q4h    ELECTROLYTES -follow labs only as needed -replace as needed -pharmacy consultation   DVT/GI PRX ordered -SCDs  TRANSFUSIONS AS NEEDED MONITOR FSBS ASSESS the need for LABS as needed   Critical care provider statement:    Critical care time (minutes):  33   Critical care time was exclusive of:  Separately billable procedures and treating other patients   Critical care was necessary to treat or prevent imminent or life-threatening deterioration of the following conditions:  Acute hypoxemic respiratory failure, COVID19 Pneumonia, multiple comorbid conditions.    Critical care was time spent personally by me on the following activities:  Development of treatment plan  with patient or surrogate, discussions with consultants, evaluation of patient's response to treatment, examination of patient, obtaining history from patient or surrogate, ordering and performing treatments and interventions, ordering and review of laboratory studies and re-evaluation of patient's condition.  I assumed direction of critical care for this patient from another provider in my specialty: no    This document was prepared using Dragon voice recognition software and may include unintentional dictation errors.    Ottie Glazier, M.D.  Division of [place]

## 2019-03-22 LAB — CBC WITH DIFFERENTIAL/PLATELET
Abs Immature Granulocytes: 0.1 10*3/uL — ABNORMAL HIGH (ref 0.00–0.07)
Basophils Absolute: 0 10*3/uL (ref 0.0–0.1)
Basophils Relative: 0 %
Eosinophils Absolute: 0 10*3/uL (ref 0.0–0.5)
Eosinophils Relative: 0 %
HCT: 30.7 % — ABNORMAL LOW (ref 36.0–46.0)
Hemoglobin: 9.8 g/dL — ABNORMAL LOW (ref 12.0–15.0)
Immature Granulocytes: 1 %
Lymphocytes Relative: 7 %
Lymphs Abs: 0.8 10*3/uL (ref 0.7–4.0)
MCH: 27.1 pg (ref 26.0–34.0)
MCHC: 31.9 g/dL (ref 30.0–36.0)
MCV: 85 fL (ref 80.0–100.0)
Monocytes Absolute: 0.6 10*3/uL (ref 0.1–1.0)
Monocytes Relative: 5 %
Neutro Abs: 10.8 10*3/uL — ABNORMAL HIGH (ref 1.7–7.7)
Neutrophils Relative %: 87 %
Platelets: 213 10*3/uL (ref 150–400)
RBC: 3.61 MIL/uL — ABNORMAL LOW (ref 3.87–5.11)
RDW: 14.3 % (ref 11.5–15.5)
WBC: 12.3 10*3/uL — ABNORMAL HIGH (ref 4.0–10.5)
nRBC: 0 % (ref 0.0–0.2)

## 2019-03-22 LAB — HEPATIC FUNCTION PANEL
ALT: 28 U/L (ref 0–44)
AST: 51 U/L — ABNORMAL HIGH (ref 15–41)
Albumin: 2.6 g/dL — ABNORMAL LOW (ref 3.5–5.0)
Alkaline Phosphatase: 60 U/L (ref 38–126)
Bilirubin, Direct: 0.1 mg/dL (ref 0.0–0.2)
Indirect Bilirubin: 0.5 mg/dL (ref 0.3–0.9)
Total Bilirubin: 0.6 mg/dL (ref 0.3–1.2)
Total Protein: 6.3 g/dL — ABNORMAL LOW (ref 6.5–8.1)

## 2019-03-22 LAB — GLUCOSE, CAPILLARY
Glucose-Capillary: 120 mg/dL — ABNORMAL HIGH (ref 70–99)
Glucose-Capillary: 159 mg/dL — ABNORMAL HIGH (ref 70–99)
Glucose-Capillary: 199 mg/dL — ABNORMAL HIGH (ref 70–99)
Glucose-Capillary: 365 mg/dL — ABNORMAL HIGH (ref 70–99)
Glucose-Capillary: 97 mg/dL (ref 70–99)

## 2019-03-22 LAB — PHOSPHORUS: Phosphorus: 5 mg/dL — ABNORMAL HIGH (ref 2.5–4.6)

## 2019-03-22 LAB — BASIC METABOLIC PANEL
Anion gap: 14 (ref 5–15)
BUN: 54 mg/dL — ABNORMAL HIGH (ref 8–23)
CO2: 22 mmol/L (ref 22–32)
Calcium: 7.6 mg/dL — ABNORMAL LOW (ref 8.9–10.3)
Chloride: 99 mmol/L (ref 98–111)
Creatinine, Ser: 1.37 mg/dL — ABNORMAL HIGH (ref 0.44–1.00)
GFR calc Af Amer: 44 mL/min — ABNORMAL LOW (ref >60)
GFR calc non Af Amer: 38 mL/min — ABNORMAL LOW (ref >60)
Glucose, Bld: 159 mg/dL — ABNORMAL HIGH (ref 70–99)
Potassium: 4.5 mmol/L (ref 3.5–5.1)
Sodium: 135 mmol/L (ref 135–145)

## 2019-03-22 LAB — MAGNESIUM: Magnesium: 2 mg/dL (ref 1.7–2.4)

## 2019-03-22 MED ORDER — ALBUTEROL SULFATE HFA 108 (90 BASE) MCG/ACT IN AERS
2.0000 | INHALATION_SPRAY | Freq: Four times a day (QID) | RESPIRATORY_TRACT | Status: DC
  Administered 2019-03-22 – 2019-03-25 (×11): 2 via RESPIRATORY_TRACT
  Filled 2019-03-22: qty 6.7

## 2019-03-22 MED ORDER — ALBUTEROL SULFATE (2.5 MG/3ML) 0.083% IN NEBU: 2.5000 mg | INHALATION_SOLUTION | Freq: Four times a day (QID) | RESPIRATORY_TRACT | Status: DC

## 2019-03-22 NOTE — Progress Notes (Signed)
CRITICAL CARE PROGRESS NOTE    Name: Rebecca Kirby MRN: [id number] DOB: 1945-03-16     LOS: 3   SUBJECTIVE FINDINGS & SIGNIFICANT EVENTS   Patient description:   This is a very pleasant 74 year old female with a history of chronic anemia, anxiety disorder, history of colon CA, GERD, hypothyroidism renal cell carcinoma history of thyroid CA, pancreatic CA status post Whipple's procedure, diabetes, came into the ED due to worsening hypoxemia post COVID-19 infection.  Patient has been positive for COVID-19 since March 11, 2019 has initially been relatively minimally symptomatic with prednisone taper and Zithromax prescribed by primary care doctor on the 15th.  Over the last 48 hours patient had also developed fevers and loose stools T-max at 101.9 and again had similar fever and diarrhea today.  Her oxygen saturation started to get worse noted to be at approximately 78% on room air.  Patient generally does not like to go to the hospital and has initially declined to come in however her daughter was insisting that she comes in and get medical attention.  In the ED she was noted to be 85% on 2 L which improved with increased supplemental oxygen satting at approximately 90% on 5 to 6 L/min nasal cannula.  Chest x-ray was reviewed independently by me which is consistent with multilobar bilateral airspace opacification consistent with COVID-19 induced pneumonia.  In the ED she received her first dose of remdesivir as well as resuscitative IV fluids.  During my examination patient does not appear to be in distress he is nontachycardic with mild tachypnea respiratory rate ~25.  She had high-sensitivity troponins done today with a mild elevation at 29 and trending down to 27 with repeat.  Blood sugars have been somewhat elevated with  most recent at 197.  Procalcitonin is mildly elevated 0.38 as well as mild elevation BNP at 139.    Lines / Drains: PIVx2  Cultures / Sepsis markers: Respiratory culture -in progress MRSA PCT - negative Blood culture x2 - NTDx24h -reincubation in progress  Antimicrobials: zithromax 500 PO daily  Vecluri x 5 days per protocol Ivermectin 9mg  po daily  Protocols / Consultants: PCCM Hospitalist  Tests / Events: [place] - Lower extermities - negative for DVT bilaterally   03/21/19- on NRB with some discomfort, decreased lasix due to euvolemia, CXR reviewed with improvement 03/22/19 - transitioned to HFNC for patient comfort, patient reports improved strength, no distress, O2 requirement unchanged from yesterday.  Will repeat CXR tommorow.  Finished Remdesevir today   PAST MEDICAL HISTORY   Past Medical History:  Diagnosis Date  . Anemia   . Anxiety   . Colon cancer ([place])   . Diabetes mellitus without complication ([place])   . Family history of adverse reaction to anesthesia    daughter had reaction to anesthesia and was in icu  . GERD (gastroesophageal reflux disease)   . Hypertension   . Hypothyroidism   . 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 Pancreas cancer ([place]) 2018  . Thyroid cancer [facility])      SURGICAL HISTORY   Past Surgical History:  Procedure Laterality Date  . CHOLECYSTECTOMY    . COLON RESECTION    . COLONOSCOPY WITH PROPOFOL N/A 09/01/2015   Procedure: COLONOSCOPY WITH PROPOFOL;  Surgeon: Manya Silvas, MD;  Location: [facility] ENDOSCOPY;  Service: Endoscopy;  Laterality: N/A;  . CYSTOSCOPY  12/28/2016   Procedure: CYSTOSCOPY;  Surgeon: Ward, Honor Loh, MD;  Location: [facility] ORS;  Service: Gynecology;;  . ESOPHAGOGASTRODUODENOSCOPY (EGD) WITH PROPOFOL N/A 09/01/2015   Procedure: ESOPHAGOGASTRODUODENOSCOPY (EGD) WITH PROPOFOL;  Surgeon: Manya Silvas, MD;  Location: [facility] ENDOSCOPY;  Service: Endoscopy;   Laterality: N/A;  . EUS N/A 06/28/2016   Procedure: FULL UPPER ENDOSCOPIC ULTRASOUND (EUS) RADIAL;  Surgeon: Jola Schmidt, MD;  Location: [facility] ENDOSCOPY;  Service: Endoscopy;  Laterality: N/A;  . EXCISION MASS NECK N/A 09/20/2015   Procedure: EXCISION MASS NECK;  Surgeon: Beverly Gust, MD;  Location: [place];  Service: ENT;  Laterality: N/A;  . LAPAROSCOPIC BILATERAL SALPINGO OOPHERECTOMY Bilateral 12/28/2016   Procedure: LAPAROSCOPIC BILATERAL SALPINGO OOPHORECTOMY;  Surgeon: Ward, Honor Loh, MD;  Location: [facility] ORS;  Service: Gynecology;  Laterality: Bilateral;  . LAPAROSCOPIC HYSTERECTOMY N/A 12/28/2016   Procedure: HYSTERECTOMY TOTAL LAPAROSCOPIC;  Surgeon: Ward, Honor Loh, MD;  Location: [facility] ORS;  Service: Gynecology;  Laterality: N/A;  . LAPAROSCOPIC LYSIS OF ADHESIONS  12/28/2016   Procedure: LAPAROSCOPIC EXTENSIVE LYSIS OF ADHESIONS;  Surgeon: Ward, Honor Loh, MD;  Location: [facility] ORS;  Service: Gynecology;;  . rt kidney removed Right   . THYROGLOSSAL DUCT CYST Right 09/20/2015   Procedure: excision sub mandibular gland right;  Surgeon: Beverly Gust, MD;  Location: [place];  Service: ENT;  Laterality: Right;  . THYROID SURGERY Bilateral   . TOTAL KNEE ARTHROPLASTY Right   . TOTAL KNEE ARTHROPLASTY Left 08/09/2014   Procedure: TOTAL KNEE ARTHROPLASTY;  Surgeon: Dereck Leep, MD;  Location: [facility] ORS;  Service: Orthopedics;  Laterality: Left;     FAMILY HISTORY   Family History  Problem Relation Age of Onset  . Breast cancer Maternal Aunt   . Breast cancer Cousin      SOCIAL HISTORY   Social History   Tobacco Use  . Smoking status: Former Smoker    Quit date: 07/29/1994    Years since quitting: 24.6  . Smokeless tobacco: Never Used  Substance Use Topics  . Alcohol use: No  . Drug use: No     MEDICATIONS   Current Medication:  Current Facility-Administered Medications:  .  acetaminophen (TYLENOL) tablet 650 mg, 650 mg, Oral, Q6H PRN, Jennye Boroughs, MD, 650 mg at 03/20/19 0944 .  ascorbic acid (VITAMIN C) tablet 500 mg, 500 mg, Oral, Daily, Yitty Roads, MD, 500 mg at 03/22/19 1024 .  azithromycin (ZITHROMAX) tablet 500 mg, 500 mg, Oral, Daily, Lanney Gins, Vermell Madrid, MD, 500 mg at 03/22/19 1023 .  calcium acetate (PHOSLO) capsule 667 mg, 667 mg, Oral, BID, Jennye Boroughs, MD, 667 mg at 03/22/19 1022 .  Chlorhexidine Gluconate Cloth 2 % PADS 6 each, 6 each, Topical, Daily, Ottie Glazier, MD, 6 each at 03/20/19 0946 .  cholecalciferol (VITAMIN D3) tablet 1,000 Units, 1,000 Units, Oral, Daily, Jennye Boroughs, MD, 1,000 Units at 03/22/19 1025 .  citalopram (CELEXA) tablet 40 mg, 40 mg, Oral, Daily, Jennye Boroughs, MD, 40 mg at 03/22/19 1019 .  cloNIDine (CATAPRES) tablet 0.1 mg, 0.1 mg, Oral, TID, Jennye Boroughs, MD, 0.1 mg at 03/22/19 1025 .  dexamethasone (DECADRON) injection 6 mg, 6 mg, Intravenous, Q12H, Lanney Gins, Jakyia Gaccione, MD, 6 mg at 03/22/19 1027 .  enoxaparin (LOVENOX) injection 40 mg, 40 mg, Subcutaneous, Q24H, Jennye Boroughs, MD, 40 mg at 03/21/19 2226 .  feeding supplement (NEPRO CARB STEADY) liquid 237 mL, 237 mL, Oral, TID BM, Keelin Sheridan, MD .  fluticasone (FLONASE) 50 MCG/ACT nasal spray 2 spray, 2 spray, Each Nare, Daily, Jennye Boroughs, MD, 2 spray at 03/21/19 1144 .  HYDROcodone-acetaminophen (NORCO) 10-325 MG per tablet 1 tablet, 1 tablet, Oral, BID PRN, Jennye Boroughs, MD .  insulin aspart (novoLOG) injection 0-15 Units, 0-15 Units, Subcutaneous, Q4H, Ottie Glazier, MD, 3 Units at 03/22/19 1015 .  insulin glargine (LANTUS) injection 40 Units, 40 Units, Subcutaneous, QHS, Jennye Boroughs, MD, 40 Units at 03/21/19 2227 .  ivermectin (STROMECTOL) tablet 9,000 mcg, 150 mcg/kg (Ideal), Oral, Daily, Jennye Boroughs, MD, 9,000 mcg at 03/22/19 1020 .  levothyroxine (SYNTHROID) tablet 175 mcg, 175 mcg, Oral, Q0600, Jennye Boroughs, MD, 175 mcg at 03/22/19 0510 .  multivitamin with minerals tablet 1 tablet, 1 tablet, Oral, Daily,  Ottie Glazier, MD, 1 tablet at 03/22/19 1023 .  ondansetron (ZOFRAN) tablet 4 mg, 4 mg, Oral, Q6H PRN **OR** ondansetron (ZOFRAN) injection 4 mg, 4 mg, Intravenous, Q6H PRN, Jennye Boroughs, MD, 4 mg at 03/22/19 1033 .  [COMPLETED] remdesivir 200 mg in sodium chloride 0.9% 250 mL IVPB, 200 mg, Intravenous, Once, Stopped at 03/19/19 1345 **FOLLOWED BY** remdesivir 100 mg in sodium chloride 0.9 % 100 mL IVPB, 100 mg, Intravenous, Daily, Jennye Boroughs, MD, Last Rate: 200 mL/hr at 03/22/19 1040, 100 mg at 03/22/19 1040 .  vitamin B-12 (CYANOCOBALAMIN) tablet 1,000 mcg, 1,000 mcg, Oral, Daily, Jennye Boroughs, MD, 1,000 mcg at 03/22/19 1024    ALLERGIES   Statins    REVIEW OF SYSTEMS     10 point ROS neg except as per sujective findings  PHYSICAL EXAMINATION   Vital Signs: Temp:  [97.8 F (36.6 C)-98.7 F (37.1 C)] 98.7 F (37.1 C) (01/24 1000) Pulse Rate:  [58-73] 72 (01/24 1000) Resp:  [13-44] 24 (01/24 1000) BP: (89-131)/(40-59) 112/45 (01/24 1025) SpO2:  [81 %-96 %] 89 % (01/24 1000) FiO2 (%):  [100 %] 100 % (01/24 0735)  GENERAL:well nourished, NAD HEAD: Normocephalic, atraumatic.  EYES: Pupils equal, round, reactive to light.  No scleral icterus.  MOUTH: Moist mucosal membrane. NECK: Supple. No thyromegaly. No nodules. No JVD.  PULMONARY: bilateral rhonchorous breath sounds with mild crackles at bases bilatearally CARDIOVASCULAR: S1 and S2. Regular rate and rhythm. No murmurs, rubs, or gallops.  GASTROINTESTINAL: Soft, nontender, non-distended. No masses. Positive bowel sounds. No hepatosplenomegaly.  MUSCULOSKELETAL: No swelling, clubbing, or edema.  NEUROLOGIC: Mild distress due to acute illness SKIN:intact,warm,dry   PERTINENT DATA     Infusions: . remdesivir 100 mg in NS 100 mL 100 mg (03/22/19 1040)   Scheduled Medications: . vitamin C  500 mg Oral Daily  . azithromycin  500 mg Oral Daily  . calcium acetate  667 mg Oral BID  . Chlorhexidine Gluconate  Cloth  6 each Topical Daily  . cholecalciferol  1,000 Units Oral Daily  . citalopram  40 mg Oral Daily  . cloNIDine  0.1 mg Oral TID  . dexamethasone (DECADRON) injection  6 mg Intravenous Q12H  . enoxaparin (LOVENOX) injection  40 mg Subcutaneous Q24H  . feeding supplement (NEPRO CARB STEADY)  237 mL Oral TID BM  . fluticasone  2 spray Each Nare Daily  . insulin aspart  0-15 Units Subcutaneous Q4H  . insulin glargine  40 Units Subcutaneous QHS  . ivermectin  150 mcg/kg (Ideal) Oral Daily  . levothyroxine  175 mcg Oral Q0600  . multivitamin with minerals  1 tablet Oral Daily  . vitamin B-12  1,000 mcg Oral Daily   PRN Medications: acetaminophen, HYDROcodone-acetaminophen, ondansetron **OR** ondansetron (ZOFRAN) IV Hemodynamic parameters:   Intake/Output: 01/23 0701 - 01/24 0700 In: 420 [P.O.:420] Out: 1350 [Urine:1350]  Ventilator  Settings: FiO2 (%):  [100 %] 100 %    LAB RESULTS:  Basic Metabolic Panel: Recent Labs  Lab 03/19/19 1218 03/19/19 1218 03/20/19 0318 03/20/19 0318 03/21/19 0554 03/22/19 0625  NA 131*  --  136  --  136 135  K 4.1   < > 4.3   < > 4.6 4.5  CL 99  --  104  --  101 99  CO2 18*  --  21*  --  21* 22  GLUCOSE 202*  --  79  --  183* 159*  BUN 46*  --  36*  --  38* 54*  CREATININE 1.65*  --  1.23*  --  1.23* 1.37*  CALCIUM 7.6*  --  7.6*  --  7.7* 7.6*  MG  --   --   --   --  1.9 2.0  PHOS  --   --   --   --  5.0* 5.0*   < > = values in this interval not displayed.   Liver Function Tests: Recent Labs  Lab 03/19/19 1218 03/20/19 0318 03/21/19 0554 03/22/19 0625  AST 71* 56* 60* 51*  ALT 32 28 33 28  ALKPHOS 54 52 61 60  BILITOT 0.8 0.6 0.7 0.6  PROT 6.6 6.3* 6.4* 6.3*  ALBUMIN 3.0* 2.7* 2.8* 2.6*   No results for input(s): LIPASE, AMYLASE in the last 168 hours. No results for input(s): AMMONIA in the last 168 hours. CBC: Recent Labs  Lab 03/19/19 1218 03/20/19 0318 03/21/19 0554 03/22/19 0625  WBC 15.4* 15.8* 9.1 12.3*   NEUTROABS 14.1* 14.2* 8.0* 10.8*  HGB 10.0* 9.7* 10.0* 9.8*  HCT 31.2* 29.7* 31.4* 30.7*  MCV 85.7 85.3 85.8 85.0  PLT 147* 168 194 213   Cardiac Enzymes: No results for input(s): CKTOTAL, CKMB, CKMBINDEX, TROPONINI in the last 168 hours. BNP: Invalid input(s): POCBNP CBG: Recent Labs  Lab 03/21/19 1123 03/21/19 1632 03/21/19 1946 03/21/19 2340 03/22/19 0742  GLUCAP 106* 193* 212* 95 159*     IMAGING RESULTS:  Imaging: DG Chest Port 1 View  Result Date: 03/21/2019 CLINICAL DATA:  74 year old female with colon cancer pancreatic cancer status post Whipple, with hypoxemia and known prior COVID infection EXAM: PORTABLE CHEST 1 VIEW COMPARISON:  03/20/2019, 03/19/2019 FINDINGS: Cardiomediastinal silhouette unchanged in size and contour. Similar distribution of mixed reticulonodular opacities throughout the bilateral lungs, with slight improvement in the right lower lung. No pneumothorax or large pleural effusion. IMPRESSION: Multifocal pneumonia of the bilateral lungs, with persisting reticulonodular opacities. Electronically Signed   By: Corrie Mckusick D.O.   On: 03/21/2019 13:59            ASSESSMENT AND PLAN    -Multidisciplinary rounds held today  Acute hypoxemic respiratory failure  - due to COVID19 induced bilateral multifocal pneumonia -patient is diabetic sensitive to steroids - will require frequent checks- currently set at q4h - Decadron 6mg IV BID today due to worsening infiltrates. -Holding Lasix as patient developing signs of contraction with mild aki - note pt is s/p nephrectomy -finished Remdesevir today - bronchopulmonary hygiene with albuterol and incentive spirometer if patient can participate  -Ivermectin 156mcg/kg of ideal body weight - low dose 9mg  p.o. daily - [place] lower - no DVT - lower likelihood of PE will not perform CT chest to avoid contrast to spare renal function  -daily hepatic panel - 03/21/19 - stable  - mild decrement in albumin -  nourishment is adequate at this time -Beta-D-Glucan - in process  -  appreciate hospitalist team collaboration -switched from NRB to HFNC - oxygenation with slight improvement over past 24h   Multiple comorbid conditions -Hx of RCC, Pancreatic CA, thyroid CA, colon CA -hx of liver cirrosis and CKD - predisposing to hypercoagulable state -currently on Lovenox PPX dose 40 [place] daily will increase to bid while acutely ill.    Essential HTN   stopped norvasc, bystolic and ibersartan to help decrase hepatoxicity while on remdesevir and ivermectin, in the interim will use low dose clonidine TID to tx HTN and help patients anxious state simultaneously -ICU telemetry monitoring -HR and BP have been in reference range on this regimen and patient states she is not anxious.    Chronic diastilic CHF - Grade I  - strict I&Os  - gentle diuresis in lieu of CKD  - fluid restriction to 1200cc/24h  -repeat CXR daily    Hypothyroidism -continue home synthroid   Chronic Renal Failure -follow chem 7 -follow UO -continue PureWick external urinary Catheter-assess need daily -patient has been on diuretic 20 bid Lasix - strict I&Os-now 1.2L net negative , will hold lasix 03/22/19 -limit PO fluid intake to 1200cc/24h -stop non-essential nephrotoxic medications - d/c protonix    ID -continue Zithromax abx as prescibed -follow up cultures  GI/Nutrition GI PROPHYLAXIS as indicated-stopping PPI  DIET-->TF's as tolerated Constipation protocol as indicated  ENDO - ICU hypoglycemic\Hyperglycemia protocol -check FSBS per protocol - currently q4h    ELECTROLYTES -follow labs only as needed -replace as needed -pharmacy consultation   DVT/GI PRX ordered -SCDs  TRANSFUSIONS AS NEEDED MONITOR FSBS ASSESS the need for LABS as needed   Critical care provider statement:    Critical care time (minutes):  34   Critical care time was exclusive of:  Separately billable procedures and treating other  patients   Critical care was necessary to treat or prevent imminent or life-threatening deterioration of the following conditions:  Acute hypoxemic respiratory failure, COVID19 Pneumonia, multiple comorbid conditions.    Critical care was time spent personally by me on the following activities:  Development of treatment plan with patient or surrogate, discussions with consultants, evaluation of patient's response to treatment, examination of patient, obtaining history from patient or surrogate, ordering and performing treatments and interventions, ordering and review of laboratory studies and re-evaluation of patient's condition.  I assumed direction of critical care for this patient from another provider in my specialty: no    This document was prepared using Dragon voice recognition software and may include unintentional dictation errors.    Ottie Glazier, M.D.  Division of [place]

## 2019-03-23 DIAGNOSIS — J9601 Acute respiratory failure with hypoxia: Secondary | ICD-10-CM

## 2019-03-23 DIAGNOSIS — J1282 Pneumonia due to coronavirus disease 2019: Secondary | ICD-10-CM

## 2019-03-23 DIAGNOSIS — U071 COVID-19: Principal | ICD-10-CM

## 2019-03-23 DIAGNOSIS — K7030 Alcoholic cirrhosis of liver without ascites: Secondary | ICD-10-CM

## 2019-03-23 LAB — GLUCOSE, CAPILLARY
Glucose-Capillary: 129 mg/dL — ABNORMAL HIGH (ref 70–99)
Glucose-Capillary: 157 mg/dL — ABNORMAL HIGH (ref 70–99)
Glucose-Capillary: 161 mg/dL — ABNORMAL HIGH (ref 70–99)
Glucose-Capillary: 161 mg/dL — ABNORMAL HIGH (ref 70–99)
Glucose-Capillary: 185 mg/dL — ABNORMAL HIGH (ref 70–99)
Glucose-Capillary: 64 mg/dL — ABNORMAL LOW (ref 70–99)
Glucose-Capillary: 93 mg/dL (ref 70–99)

## 2019-03-23 LAB — CBC WITH DIFFERENTIAL/PLATELET
Abs Immature Granulocytes: 0.14 10*3/uL — ABNORMAL HIGH (ref 0.00–0.07)
Basophils Absolute: 0 10*3/uL (ref 0.0–0.1)
Basophils Relative: 0 %
Eosinophils Absolute: 0 10*3/uL (ref 0.0–0.5)
Eosinophils Relative: 0 %
HCT: 30.5 % — ABNORMAL LOW (ref 36.0–46.0)
Hemoglobin: 9.9 g/dL — ABNORMAL LOW (ref 12.0–15.0)
Immature Granulocytes: 1 %
Lymphocytes Relative: 6 %
Lymphs Abs: 0.9 10*3/uL (ref 0.7–4.0)
MCH: 27.2 pg (ref 26.0–34.0)
MCHC: 32.5 g/dL (ref 30.0–36.0)
MCV: 83.8 fL (ref 80.0–100.0)
Monocytes Absolute: 0.5 10*3/uL (ref 0.1–1.0)
Monocytes Relative: 3 %
Neutro Abs: 12.8 10*3/uL — ABNORMAL HIGH (ref 1.7–7.7)
Neutrophils Relative %: 90 %
Platelets: 217 10*3/uL (ref 150–400)
RBC: 3.64 MIL/uL — ABNORMAL LOW (ref 3.87–5.11)
RDW: 14.1 % (ref 11.5–15.5)
WBC: 14.3 10*3/uL — ABNORMAL HIGH (ref 4.0–10.5)
nRBC: 0 % (ref 0.0–0.2)

## 2019-03-23 LAB — BASIC METABOLIC PANEL
Anion gap: 13 (ref 5–15)
BUN: 63 mg/dL — ABNORMAL HIGH (ref 8–23)
CO2: 24 mmol/L (ref 22–32)
Calcium: 7.8 mg/dL — ABNORMAL LOW (ref 8.9–10.3)
Chloride: 99 mmol/L (ref 98–111)
Creatinine, Ser: 1.58 mg/dL — ABNORMAL HIGH (ref 0.44–1.00)
GFR calc Af Amer: 37 mL/min — ABNORMAL LOW (ref >60)
GFR calc non Af Amer: 32 mL/min — ABNORMAL LOW (ref >60)
Glucose, Bld: 74 mg/dL (ref 70–99)
Potassium: 5.5 mmol/L — ABNORMAL HIGH (ref 3.5–5.1)
Sodium: 136 mmol/L (ref 135–145)

## 2019-03-23 LAB — PHOSPHORUS: Phosphorus: 4.7 mg/dL — ABNORMAL HIGH (ref 2.5–4.6)

## 2019-03-23 LAB — POTASSIUM: Potassium: 4.4 mmol/L (ref 3.5–5.1)

## 2019-03-23 LAB — PROCALCITONIN: Procalcitonin: 0.11 ng/mL

## 2019-03-23 MED ORDER — DEXTROSE 50 % IV SOLN
1.0000 | Freq: Once | INTRAVENOUS | Status: DC
  Filled 2019-03-23: qty 50

## 2019-03-23 MED ORDER — FUROSEMIDE 10 MG/ML IJ SOLN
40.0000 mg | Freq: Once | INTRAMUSCULAR | Status: AC
  Administered 2019-03-23: 40 mg via INTRAVENOUS
  Filled 2019-03-23: qty 4

## 2019-03-23 MED ORDER — DEXTROSE 50 % IV SOLN
1.0000 | Freq: Once | INTRAVENOUS | Status: AC
  Administered 2019-03-23: 50 mL via INTRAVENOUS
  Filled 2019-03-23: qty 50

## 2019-03-23 MED ORDER — PATIROMER SORBITEX CALCIUM 8.4 G PO PACK
8.4000 g | PACK | Freq: Every day | ORAL | Status: DC
  Filled 2019-03-23: qty 1

## 2019-03-23 MED ORDER — INSULIN ASPART 100 UNIT/ML ~~LOC~~ SOLN
0.0000 [IU] | Freq: Three times a day (TID) | SUBCUTANEOUS | Status: DC
  Administered 2019-03-23: 3 [IU] via SUBCUTANEOUS
  Administered 2019-03-24: 5 [IU] via SUBCUTANEOUS
  Administered 2019-03-24 (×2): 3 [IU] via SUBCUTANEOUS
  Administered 2019-03-25: 8 [IU] via SUBCUTANEOUS
  Administered 2019-03-25: 5 [IU] via SUBCUTANEOUS
  Administered 2019-03-25 – 2019-03-26 (×2): 8 [IU] via SUBCUTANEOUS
  Administered 2019-03-26: 11 [IU] via SUBCUTANEOUS
  Administered 2019-03-26: 3 [IU] via SUBCUTANEOUS
  Administered 2019-03-27: 2 [IU] via SUBCUTANEOUS
  Administered 2019-03-27: 8 [IU] via SUBCUTANEOUS
  Administered 2019-03-27: 11 [IU] via SUBCUTANEOUS
  Administered 2019-03-28: 5 [IU] via SUBCUTANEOUS
  Administered 2019-03-28: 3 [IU] via SUBCUTANEOUS
  Administered 2019-03-28 – 2019-03-29 (×3): 5 [IU] via SUBCUTANEOUS
  Administered 2019-03-30: 18:00:00 2 [IU] via SUBCUTANEOUS
  Administered 2019-03-30: 08:00:00 5 [IU] via SUBCUTANEOUS
  Administered 2019-03-30 – 2019-03-31 (×2): 3 [IU] via SUBCUTANEOUS
  Filled 2019-03-23 (×22): qty 1

## 2019-03-23 MED ORDER — INSULIN ASPART 100 UNIT/ML IV SOLN
5.0000 [IU] | Freq: Once | INTRAVENOUS | Status: AC
  Administered 2019-03-23: 5 [IU] via INTRAVENOUS
  Filled 2019-03-23: qty 0.05

## 2019-03-23 MED ORDER — INSULIN ASPART 100 UNIT/ML ~~LOC~~ SOLN
5.0000 [IU] | Freq: Once | SUBCUTANEOUS | Status: AC
  Administered 2019-03-23: 5 [IU] via SUBCUTANEOUS
  Filled 2019-03-23: qty 1

## 2019-03-23 MED ORDER — NEPRO/CARBSTEADY PO LIQD
237.0000 mL | Freq: Three times a day (TID) | ORAL | Status: DC
  Administered 2019-03-23 – 2019-04-01 (×6): 237 mL via ORAL

## 2019-03-23 NOTE — Progress Notes (Signed)
PT Cancellation Note  Patient Details Name: Rebecca Kirby MRN: [id number] DOB: Feb 28, 1945   Cancelled Treatment:    Reason Eval/Treat Not Completed: Medical issues which prohibited therapy: Pt's Ka 5.5 and trending up falling outside guidelines for participation with PT services. Will attempt to see pt at a future date/time as medically appropriate.     Linus Salmons PT, DPT 03/23/19, 1:05 PM

## 2019-03-23 NOTE — Progress Notes (Signed)
CRITICAL CARE PROGRESS NOTE    Name: Rebecca Kirby MRN: [id number] DOB: Sep 27, 1945     LOS: 4   SUBJECTIVE FINDINGS & SIGNIFICANT EVENTS   Patient description:   This is a very pleasant 74 year old female with a history of chronic anemia, anxiety disorder, history of colon CA, GERD, hypothyroidism renal cell carcinoma history of thyroid CA, pancreatic CA status post Whipple's procedure, diabetes, came into the ED due to worsening hypoxemia post COVID-19 infection.  Patient has been positive for COVID-19 since March 11, 2019 has initially been relatively minimally symptomatic with prednisone taper and Zithromax prescribed by primary care doctor on the 15th.  Over the last 48 hours patient had also developed fevers and loose stools T-max at 101.9 and again had similar fever and diarrhea today.  Her oxygen saturation started to get worse noted to be at approximately 78% on room air.  Patient generally does not like to go to the hospital and has initially declined to come in however her daughter was insisting that she comes in and get medical attention.  In the ED she was noted to be 85% on 2 L which improved with increased supplemental oxygen satting at approximately 90% on 5 to 6 L/min nasal cannula.  Chest x-ray was reviewed independently by me which is consistent with multilobar bilateral airspace opacification consistent with COVID-19 induced pneumonia.  In the ED she received her first dose of remdesivir as well as resuscitative IV fluids.  During my examination patient does not appear to be in distress he is nontachycardic with mild tachypnea respiratory rate ~25.  She had high-sensitivity troponins done today with a mild elevation at 29 and trending down to 27 with repeat.  Blood sugars have been somewhat elevated with  most recent at 197.  Procalcitonin is mildly elevated 0.38 as well as mild elevation BNP at 139.    Lines / Drains: PIVx2  Cultures / Sepsis markers: Respiratory culture -in progress MRSA PCT - negative Blood culture x2 - NTDx24h -reincubation in progress  Antimicrobials: zithromax 500 PO daily  Vecluri x 5 days per protocol Ivermectin 9mg  po daily  Protocols / Consultants: PCCM Hospitalist  Tests / Events: [place] - Lower extermities - negative for DVT bilaterally   03/21/19- on NRB with some discomfort, decreased lasix due to euvolemia, CXR reviewed with improvement 03/22/19 - transitioned to HFNC for patient comfort, patient reports improved strength, no distress, O2 requirement unchanged from yesterday.  Will repeat CXR tommorow.  Finished Remdesevir today   CC Follow up severe resp failure  HPI Remains very hypoxic Severe ARDS Severe resp failure High risk for intubation      MEDICATIONS   Current Medication:  Current Facility-Administered Medications:  .  acetaminophen (TYLENOL) tablet 650 mg, 650 mg, Oral, Q6H PRN, Jennye Boroughs, MD, 650 mg at 03/20/19 0944 .  albuterol (VENTOLIN HFA) 108 (90 Base) MCG/ACT inhaler 2 puff, 2 puff, Inhalation, Q6H, Aleskerov, Fuad, MD, 2 puff at 03/23/19 0245 .  ascorbic acid (VITAMIN C) tablet 500 mg, 500 mg, Oral, Daily, Aleskerov, Fuad, MD, 500 mg at 03/22/19 1024 .  azithromycin (ZITHROMAX) tablet 500 mg, 500 mg, Oral, Daily, Lanney Gins, Fuad, MD, 500 mg at 03/22/19 1023 .  calcium acetate (PHOSLO) capsule 667 mg, 667 mg, Oral, BID, Jennye Boroughs, MD, 667 mg at 03/22/19 2157 .  Chlorhexidine Gluconate Cloth 2 % PADS 6 each, 6 each, Topical, Daily, Ottie Glazier, MD, 6 each at 03/22/19 2038 .  cholecalciferol (VITAMIN D3) tablet 1,000 Units,  1,000 Units, Oral, Daily, Jennye Boroughs, MD, 1,000 Units at 03/22/19 1025 .  citalopram (CELEXA) tablet 40 mg, 40 mg, Oral, Daily, Jennye Boroughs, MD, 40 mg at 03/22/19 1019 .   cloNIDine (CATAPRES) tablet 0.1 mg, 0.1 mg, Oral, TID, Jennye Boroughs, MD, 0.1 mg at 03/22/19 2158 .  dexamethasone (DECADRON) injection 6 mg, 6 mg, Intravenous, Q12H, Ottie Glazier, MD, 6 mg at 03/22/19 2157 .  insulin aspart (novoLOG) injection 5 Units, 5 Units, Intravenous, Once **AND** dextrose 50 % solution 50 mL, 1 ampule, Intravenous, Once, Blakeney, Dana G, NP .  dextrose 50 % solution 50 mL, 1 ampule, Intravenous, Once, Blakeney, Dreama Saa, NP .  enoxaparin (LOVENOX) injection 40 mg, 40 mg, Subcutaneous, Q24H, Jennye Boroughs, MD, 40 mg at 03/22/19 2158 .  fluticasone (FLONASE) 50 MCG/ACT nasal spray 2 spray, 2 spray, Each Nare, Daily, Jennye Boroughs, MD, 2 spray at 03/21/19 1144 .  HYDROcodone-acetaminophen (NORCO) 10-325 MG per tablet 1 tablet, 1 tablet, Oral, BID PRN, Jennye Boroughs, MD .  insulin aspart (novoLOG) injection 0-15 Units, 0-15 Units, Subcutaneous, Q4H, Ottie Glazier, MD, 3 Units at 03/22/19 2354 .  insulin aspart (novoLOG) injection 5 Units, 5 Units, Subcutaneous, Once, Blakeney, Dreama Saa, NP .  insulin glargine (LANTUS) injection 40 Units, 40 Units, Subcutaneous, QHS, Jennye Boroughs, MD, 40 Units at 03/22/19 2157 .  ivermectin (STROMECTOL) tablet 9,000 mcg, 150 mcg/kg (Ideal), Oral, Daily, Jennye Boroughs, MD, 9,000 mcg at 03/22/19 1020 .  levothyroxine (SYNTHROID) tablet 175 mcg, 175 mcg, Oral, Q0600, Jennye Boroughs, MD, 175 mcg at 03/23/19 0541 .  multivitamin with minerals tablet 1 tablet, 1 tablet, Oral, Daily, Ottie Glazier, MD, 1 tablet at 03/22/19 1023 .  ondansetron (ZOFRAN) tablet 4 mg, 4 mg, Oral, Q6H PRN **OR** ondansetron (ZOFRAN) injection 4 mg, 4 mg, Intravenous, Q6H PRN, Jennye Boroughs, MD, 4 mg at 03/22/19 1033 .  [COMPLETED] remdesivir 200 mg in sodium chloride 0.9% 250 mL IVPB, 200 mg, Intravenous, Once, Stopped at 03/19/19 1345 **FOLLOWED BY** remdesivir 100 mg in sodium chloride 0.9 % 100 mL IVPB, 100 mg, Intravenous, Daily, Jennye Boroughs, MD, Last Rate:  200 mL/hr at 03/22/19 1040, 100 mg at 03/22/19 1040 .  vitamin B-12 (CYANOCOBALAMIN) tablet 1,000 mcg, 1,000 mcg, Oral, Daily, Jennye Boroughs, MD, 1,000 mcg at 03/22/19 1024    ALLERGIES   Statins    REVIEW OF SYSTEMS     10 point ROS neg except as per sujective findings  PHYSICAL EXAMINATION   Vital Signs: Temp:  [98.4 F (36.9 C)-99.8 F (37.7 C)] 99.2 F (37.3 C) (01/25 0400) Pulse Rate:  [58-73] 63 (01/25 0700) Resp:  [18-42] 29 (01/25 0700) BP: (96-129)/(36-63) 129/47 (01/25 0700) SpO2:  [80 %-95 %] 91 % (01/25 0700) FiO2 (%):  [90 %-100 %] 92 % (01/25 0426)  COVID-19 DISASTER DECLARATION:   FULL CONTACT PHYSICAL EXAMINATION WAS NOT POSSIBLE DUE TO TREATMENT OF COVID-19 AND   CONSERVATION OF PERSONAL PROTECTIVE EQUIPMENT, LIMITED EXAM FINDINGS INCLUDE-   Patient assessed or the symptoms described in the history of present illness.   In the context of the Global COVID-19 pandemic, which necessitated consideration that the patient might be at risk for infection with the SARS-CoV-2 virus that causes COVID-19, Institutional protocols and algorithms that pertain to the evaluation of patients at risk for COVID-19 are in a state of rapid change based on information released by regulatory bodies including the CDC and federal and state organizations. These policies and algorithms were followed during the patient's care while in  hospital.     PERTINENT DATA     Infusions: . remdesivir 100 mg in NS 100 mL 100 mg (03/22/19 1040)   Scheduled Medications: . albuterol  2 puff Inhalation Q6H  . vitamin C  500 mg Oral Daily  . azithromycin  500 mg Oral Daily  . calcium acetate  667 mg Oral BID  . Chlorhexidine Gluconate Cloth  6 each Topical Daily  . cholecalciferol  1,000 Units Oral Daily  . citalopram  40 mg Oral Daily  . cloNIDine  0.1 mg Oral TID  . dexamethasone (DECADRON) injection  6 mg Intravenous Q12H  . insulin aspart  5 Units Intravenous Once   And  .  dextrose  1 ampule Intravenous Once  . dextrose  1 ampule Intravenous Once  . enoxaparin (LOVENOX) injection  40 mg Subcutaneous Q24H  . fluticasone  2 spray Each Nare Daily  . insulin aspart  0-15 Units Subcutaneous Q4H  . insulin aspart  5 Units Subcutaneous Once  . insulin glargine  40 Units Subcutaneous QHS  . ivermectin  150 mcg/kg (Ideal) Oral Daily  . levothyroxine  175 mcg Oral Q0600  . multivitamin with minerals  1 tablet Oral Daily  . vitamin B-12  1,000 mcg Oral Daily   PRN Medications: acetaminophen, HYDROcodone-acetaminophen, ondansetron **OR** ondansetron (ZOFRAN) IV Hemodynamic parameters:   Intake/Output: 01/24 0701 - 01/25 0700 In: 240 [P.O.:240] Out: 1350 [id number]  Ventilator  Settings: FiO2 (%):  [90 %-100 %] 92 %  FiO2 (%):  [90 %-100 %] 92 %   LAB RESULTS:  Basic Metabolic Panel: Recent Labs  Lab 03/19/19 1218 03/19/19 1218 03/20/19 0318 03/20/19 0318 03/21/19 0554 03/21/19 0554 03/22/19 0625 03/23/19 0407  NA 131*  --  136  --  136  --  135 136  K 4.1   < > 4.3   < > 4.6   < > 4.5 5.5*  CL 99  --  104  --  101  --  99 99  CO2 18*  --  21*  --  21*  --  22 24  GLUCOSE 202*  --  79  --  183*  --  159* 74  BUN 46*  --  36*  --  38*  --  54* 63*  CREATININE 1.65*  --  1.23*  --  1.23*  --  1.37* 1.58*  CALCIUM 7.6*  --  7.6*  --  7.7*  --  7.6* 7.8*  MG  --   --   --   --  1.9  --  2.0  --   PHOS  --   --   --   --  5.0*  --  5.0*  --    < > = values in this interval not displayed.   Liver Function Tests: Recent Labs  Lab 03/19/19 1218 03/20/19 0318 03/21/19 0554 03/22/19 0625  AST 71* 56* 60* 51*  ALT 32 28 33 28  ALKPHOS 54 52 61 60  BILITOT 0.8 0.6 0.7 0.6  PROT 6.6 6.3* 6.4* 6.3*  ALBUMIN 3.0* 2.7* 2.8* 2.6*   No results for input(s): LIPASE, AMYLASE in the last 168 hours. No results for input(s): AMMONIA in the last 168 hours. CBC: Recent Labs  Lab 03/19/19 1218 03/20/19 0318 03/21/19 0554 03/22/19 0625 03/23/19 0407   WBC 15.4* 15.8* 9.1 12.3* 14.3*  NEUTROABS 14.1* 14.2* 8.0* 10.8* 12.8*  HGB 10.0* 9.7* 10.0* 9.8* 9.9*  HCT 31.2* 29.7* 31.4* 30.7* 30.5*  MCV  85.7 85.3 85.8 85.0 83.8  PLT 147* 168 194 213 217   Cardiac Enzymes: No results for input(s): CKTOTAL, CKMB, CKMBINDEX, TROPONINI in the last 168 hours. BNP: Invalid input(s): POCBNP CBG: Recent Labs  Lab 03/22/19 1948 03/22/19 2342 03/23/19 0354 03/23/19 0434 03/23/19 0733  GLUCAP 365* 199* 64* 93 129*     IMAGING RESULTS:  Imaging: DG Chest Port 1 View  Result Date: 03/21/2019 CLINICAL DATA:  74 year old female with colon cancer pancreatic cancer status post Whipple, with hypoxemia and known prior COVID infection EXAM: PORTABLE CHEST 1 VIEW COMPARISON:  03/20/2019, 03/19/2019 FINDINGS: Cardiomediastinal silhouette unchanged in size and contour. Similar distribution of mixed reticulonodular opacities throughout the bilateral lungs, with slight improvement in the right lower lung. No pneumothorax or large pleural effusion. IMPRESSION: Multifocal pneumonia of the bilateral lungs, with persisting reticulonodular opacities. Electronically Signed   By: Corrie Mckusick D.O.   On: 03/21/2019 13:59            ASSESSMENT AND PLAN    Severe ACUTE Hypoxic and Hypercapnic Respiratory Failure COVID-19 pneumonia and ARDS   Severe COVID-19 infection, ARDS and pneumonia/pneumonitis Continue IV steroids  IV remdisivir completed Aggressive pulm toilet recommended Pulmonary hygiene Continue proning as tolerated due to severe hypoxia   Maintain airborne and contact precautions  As needed bronchodilators (MDI) Vitamin C and zinc Antitussives High risk for intubation and death   ACUTE DIASTOLIC CARDIAC FAILURE-  -oxygen as needed -Lasix as tolerated  ACUTE KIDNEY INJURY/Renal Failure -follow chem 7 -follow UO -continue Foley Catheter-assess need -Avoid nephrotoxic agents   PAST history -Hx of RCC, Pancreatic CA, thyroid CA,  colon CA -hx of liver cirrosis and CKD - predisposing to hypercoagulable state -currently on Lovenox PPX dose 40 [place] daily will increase to bid while acutely ill.   ELECTROLYTES -follow labs as needed -replace as needed -pharmacy consultation and following    DVT/GI PRX ordered TRANSFUSIONS AS NEEDED MONITOR FSBS ASSESS the need for LABS as needed  ENDO - ICU hypoglycemic\Hyperglycemia protocol -check FSBS per protocol    Critical Care Time devoted to patient care services described in this note is 35 minutes.   Overall, patient is critically ill, prognosis is guarded.  Patient with Multiorgan failure and at high risk for cardiac arrest and death.    Daughter Butch Penny updated  Maretta Bees Patricia Pesa, M.D.  [facility]  Medical Director [place] Director [facility] Cardio-Pulmonary Department

## 2019-03-23 NOTE — Progress Notes (Signed)
Inpatient Diabetes Program Recommendations  AACE/ADA: New Consensus Statement on Inpatient Glycemic Control (2015)  Target Ranges:  Prepandial:   less than 140 mg/dL      Peak postprandial:   less than 180 mg/dL (1-2 hours)      Critically ill patients:  140 - 180 mg/dL   Lab Results  Component Value Date   GLUCAP 129 (H) 03/23/2019   HGBA1C 7.4 (H) 03/19/2019    Review of Glycemic Control  Results for REEDA, HOLLERS (MRN [id number]) as of 03/23/2019 11:27  Ref. Range 03/22/2019 23:42 03/23/2019 03:54 03/23/2019 04:34 03/23/2019 07:33  Glucose-Capillary Latest Ref Range: 70 - 99 mg/dL 199 (H) 64 (L) 93 129 (H)    Diabetes history: DM2 Outpatient Diabetes medications: Humalog 18-34 units TID + Tresiba 90 units QD + Metformin 500 mg BID Current orders for Inpatient glycemic control: Novolog 0-15 Q4H + Lantus 40 units QHS + Decadron 6 mg Q12H  Inpatient Diabetes Program Recommendations:     -Novolog 0-15 TID with meals (this should minimize risk for hypoglycemia)  Thank you, Reche Dixon, RN, BSN Diabetes Coordinator Inpatient Diabetes Program [phone] (team pager from 8a-5p)

## 2019-03-23 NOTE — Progress Notes (Signed)
Attempted to administer d50 and IV insulin. Patient has 1 piv and is a hard stick. PIV is now leaking significantly and there is no guarantee the patient would receive all the IV medication. Notified Dana NP that the medication could not be safely administered. Will hold the admin of the d50 and insulin until a midline can be placed by the IV team.

## 2019-03-23 NOTE — Consult Note (Signed)
PHARMACY CONSULT NOTE - FOLLOW UP  Pharmacy Consult for Electrolyte Monitoring and Replacement   Sheffield Slider. Lomack is a 74 y.o. female admitted on 03/19/2019 due to worsening hypoxemia post COVID-19 infection, along with acute hypoxemic respiratory failure; pt tested positive on 03/11/2019. CXRs were consistent with COVID-19-induced pneumonia. Pharmacy consulted to assist with monitoring and replacing electrolytes.   Recent Labs: Potassium (mmol/L)  Date Value  03/23/2019 5.5 (H)  03/05/2014 4.6   Magnesium (mg/dL)  Date Value  03/22/2019 2.0   Calcium (mg/dL)  Date Value  03/23/2019 7.8 (L)   Calcium, Total (mg/dL)  Date Value  03/05/2014 9.5   Albumin (g/dL)  Date Value  03/22/2019 2.6 (L)  02/10/2014 3.1 (L)   Phosphorus (mg/dL)  Date Value  03/23/2019 4.7 (H)   Sodium (mmol/L)  Date Value  03/23/2019 136  03/05/2014 138     Assessment: 1. Electrolytes: Potassium levels are increased today. The initiation of insulin aspart 5 units x 2 doses and furosemide IV 40mg  x 1 dose should help to lower potassium values. Phosphorus is still elevated but has improved slightly. Corrected calcium levels is 8.92 (WNL). Will continue to monitor electrolytes in am labs. Will replace to achieve goal for potassium ~4, magnesium ~2, sodium ~140.   2. Glucose: Today's range: 64-161. The glucose values are overall lower than yesterday. Pt is on insulin aspart 0-15 units [place] TID WM, along with insulin glargine 40 units [place] QHS. Also received insulin aspart 5 units x 2 doses today. Pt is on dexamethasone 6mg  IV Q12H. Continue insulin administrations and monitor glucose daily.   3. Constipation: Last BM 01/23. Pt is not on constipation meds. Continue to monitor and add constipation-relief meds if no BM.     Roanna Banning ,Pharm.D Candidate 03/23/2019 2:43 PM

## 2019-03-24 DIAGNOSIS — K7460 Unspecified cirrhosis of liver: Secondary | ICD-10-CM

## 2019-03-24 LAB — BASIC METABOLIC PANEL
Anion gap: 11 (ref 5–15)
BUN: 67 mg/dL — ABNORMAL HIGH (ref 8–23)
CO2: 24 mmol/L (ref 22–32)
Calcium: 7.6 mg/dL — ABNORMAL LOW (ref 8.9–10.3)
Chloride: 97 mmol/L — ABNORMAL LOW (ref 98–111)
Creatinine, Ser: 1.48 mg/dL — ABNORMAL HIGH (ref 0.44–1.00)
GFR calc Af Amer: 40 mL/min — ABNORMAL LOW (ref >60)
GFR calc non Af Amer: 34 mL/min — ABNORMAL LOW (ref >60)
Glucose, Bld: 152 mg/dL — ABNORMAL HIGH (ref 70–99)
Potassium: 5.1 mmol/L (ref 3.5–5.1)
Sodium: 132 mmol/L — ABNORMAL LOW (ref 135–145)

## 2019-03-24 LAB — CBC WITH DIFFERENTIAL/PLATELET
Abs Immature Granulocytes: 0.13 10*3/uL — ABNORMAL HIGH (ref 0.00–0.07)
Basophils Absolute: 0 10*3/uL (ref 0.0–0.1)
Basophils Relative: 0 %
Eosinophils Absolute: 0 10*3/uL (ref 0.0–0.5)
Eosinophils Relative: 0 %
HCT: 30.4 % — ABNORMAL LOW (ref 36.0–46.0)
Hemoglobin: 9.7 g/dL — ABNORMAL LOW (ref 12.0–15.0)
Immature Granulocytes: 1 %
Lymphocytes Relative: 4 %
Lymphs Abs: 0.5 10*3/uL — ABNORMAL LOW (ref 0.7–4.0)
MCH: 26.8 pg (ref 26.0–34.0)
MCHC: 31.9 g/dL (ref 30.0–36.0)
MCV: 84 fL (ref 80.0–100.0)
Monocytes Absolute: 0.3 10*3/uL (ref 0.1–1.0)
Monocytes Relative: 2 %
Neutro Abs: 11.5 10*3/uL — ABNORMAL HIGH (ref 1.7–7.7)
Neutrophils Relative %: 93 %
Platelets: 225 10*3/uL (ref 150–400)
RBC: 3.62 MIL/uL — ABNORMAL LOW (ref 3.87–5.11)
RDW: 14 % (ref 11.5–15.5)
WBC: 12.5 10*3/uL — ABNORMAL HIGH (ref 4.0–10.5)
nRBC: 0 % (ref 0.0–0.2)

## 2019-03-24 LAB — CULTURE, BLOOD (ROUTINE X 2)
Culture: NO GROWTH
Culture: NO GROWTH
Special Requests: ADEQUATE
Special Requests: ADEQUATE

## 2019-03-24 LAB — GLUCOSE, CAPILLARY
Glucose-Capillary: 159 mg/dL — ABNORMAL HIGH (ref 70–99)
Glucose-Capillary: 169 mg/dL — ABNORMAL HIGH (ref 70–99)
Glucose-Capillary: 157 mg/dL — ABNORMAL HIGH (ref 70–99)
Glucose-Capillary: 230 mg/dL — ABNORMAL HIGH (ref 70–99)
Glucose-Capillary: 318 mg/dL — ABNORMAL HIGH (ref 70–99)
Glucose-Capillary: 268 mg/dL — ABNORMAL HIGH (ref 70–99)

## 2019-03-24 LAB — HEPATIC FUNCTION PANEL
ALT: 31 U/L (ref 0–44)
AST: 51 U/L — ABNORMAL HIGH (ref 15–41)
Albumin: 2.5 g/dL — ABNORMAL LOW (ref 3.5–5.0)
Alkaline Phosphatase: 69 U/L (ref 38–126)
Bilirubin, Direct: 0.1 mg/dL (ref 0.0–0.2)
Indirect Bilirubin: 0.7 mg/dL (ref 0.3–0.9)
Total Bilirubin: 0.8 mg/dL (ref 0.3–1.2)
Total Protein: 6.2 g/dL — ABNORMAL LOW (ref 6.5–8.1)

## 2019-03-24 LAB — PHOSPHORUS: Phosphorus: 5.7 mg/dL — ABNORMAL HIGH (ref 2.5–4.6)

## 2019-03-24 LAB — MAGNESIUM: Magnesium: 2.2 mg/dL (ref 1.7–2.4)

## 2019-03-24 MED ORDER — NYSTATIN 100000 UNIT/ML MT SUSP
5.0000 mL | Freq: Four times a day (QID) | OROMUCOSAL | Status: DC
  Administered 2019-03-24 – 2019-04-06 (×38): 500000 [IU] via ORAL
  Filled 2019-03-24 (×53): qty 5

## 2019-03-24 MED ORDER — MAGIC MOUTHWASH W/LIDOCAINE
5.0000 mL | Freq: Four times a day (QID) | ORAL | Status: DC
  Filled 2019-03-24 (×4): qty 5

## 2019-03-24 MED ORDER — LIDOCAINE VISCOUS HCL 2 % MT SOLN
15.0000 mL | Freq: Four times a day (QID) | OROMUCOSAL | Status: DC
  Administered 2019-03-24 – 2019-03-28 (×10): 15 mL via OROMUCOSAL
  Filled 2019-03-24 (×14): qty 15

## 2019-03-24 MED ORDER — MAGIC MOUTHWASH
5.0000 mL | Freq: Four times a day (QID) | ORAL | Status: DC
  Administered 2019-03-24 – 2019-03-29 (×17): 5 mL via ORAL
  Filled 2019-03-24 (×33): qty 10

## 2019-03-24 MED ORDER — PATIROMER SORBITEX CALCIUM 8.4 G PO PACK
8.4000 g | PACK | Freq: Once | ORAL | Status: AC
  Administered 2019-03-24: 8.4 g via ORAL
  Filled 2019-03-24: qty 1

## 2019-03-24 NOTE — Consult Note (Signed)
PHARMACY CONSULT NOTE - FOLLOW UP  Pharmacy Consult for Electrolyte Monitoring and Replacement   Rebecca Kirby is a 74 y.o. female admitted on 03/19/2019 due to worsening hypoxemia post COVID-19 infection, along with acute hypoxemic respiratory failure; pt tested positive on 03/11/2019. CXRs were consistent with COVID-19-induced pneumonia. Pharmacy consulted to assist with monitoring and replacing electrolytes.   Recent Labs: Potassium (mmol/L)  Date Value  03/24/2019 5.1  03/05/2014 4.6   Magnesium (mg/dL)  Date Value  03/24/2019 2.2   Calcium (mg/dL)  Date Value  03/24/2019 7.6 (L)   Calcium, Total (mg/dL)  Date Value  03/05/2014 9.5   Albumin (g/dL)  Date Value  03/24/2019 2.5 (L)  02/10/2014 3.1 (L)   Phosphorus (mg/dL)  Date Value  03/24/2019 5.7 (H)   Sodium (mmol/L)  Date Value  03/24/2019 132 (L)  03/05/2014 138     Assessment: 1. Electrolytes: [place] x 1. Will continue to monitor electrolytes in am labs. Will replace to achieve goal for potassium ~4, magnesium ~2, sodium ~140.   2. Glucose: Pt is on insulin aspart 0-15 units [place] TID WM, along with insulin glargine 40 units [place] QHS.Pt is on dexamethasone 6mg  IV Q12H. Continue insulin administrations and monitor glucose daily.   3. Constipation: Last BM 01/23. Patient is not eating as throat hurts. Will continue to monitor.   Pharmacy will continue to monitor and adjust per consult.   Williams Dietrick L 03/24/2019 4:30 PM

## 2019-03-24 NOTE — Progress Notes (Signed)
OT Cancellation Note  Patient Details Name: BINDU BUTZER MRN: [id number] DOB: 1945/12/13   Cancelled Treatment:    Reason Eval/Treat Not Completed: Other (comment). Upon attempt, pt visualized removing non re-breather to blow her nose with a tissue. O2 sats decreasing into the 60's, improving slowly once mask replaced. Will hold exertional activity at this time and re-attempt at later time as pt is better able to tolerate.   Jeni Salles, MPH, MS, OTR/L ascom [phone] 03/24/19, 9:08 AM

## 2019-03-24 NOTE — Progress Notes (Signed)
CRITICAL CARE NOTE Patient description:  74 year old female with a history of chronic anemia, anxiety disorder, history of colon CA, GERD, hypothyroidism renal cell carcinoma history of thyroid CA, pancreatic CA status post Whipple's procedure, diabetes, came into the ED due to worsening hypoxemia post COVID-19 infection. Patient has been positive for COVID-19 since March 11, 2019  Patient generally does not like to go to the hospital and has initially declined to come in however her daughter was insisting that she comes in and get medical attention. In the ED she was noted to be 85% on 2 L which improved with increased supplemental oxygen satting at approximately 90% on 5 to 6 L/min nasal cannula. Chest x-ray was reviewed-consistent with multilobar bilateral airspace opacification consistent with COVID-19 induced pneumonia.   Lines / Drains: PIVx2  Cultures / Sepsis markers: Respiratory culture -in progress MRSA PCT - negative Blood culture x2 - NTDx24h -reincubation in progress  Antimicrobials: zithromax 500 PO daily  Vecluri x 5 days per protocol Ivermectin 9mg  po daily  Protocols / Consultants: PCCM Hospitalist  Tests / Events: [place] - Lower extermities - negative for DVT bilaterally   03/21/19- on NRB with some discomfort, decreased lasix due to euvolemia, CXR reviewed with improvement 03/22/19 - transitioned to HFNC for patient comfort, patient reports improved strength, no distress, O2 requirement unchanged from yesterday.  Will repeat CXR tommorow.  Finished Remdesevir today 1/25 daughter updated,severe hypoxia 1/26 remains hypoxic fio2 100% RR 20's  FiO2 (%):  [93 %-100 %] 100 %       CC  follow up respiratory failure  SUBJECTIVE Patient remains critically ill Prognosis is guarded Severe hypoxia    BP (!) 129/43   Pulse (!) 58   Temp 98.8 F (37.1 C) (Axillary)   Resp (!) 24   Ht 5\' 5"  (1.651 m)   Wt 98.3 kg   SpO2 (!) 89%   BMI 36.06 kg/m     I/O last 3 completed shifts: In: 57 [P.O.:720] Out: 2000 [Urine:2000] No intake/output data recorded.  SpO2: (!) 89 % O2 Flow Rate (L/min): 15 L/min FiO2 (%): 100 %   SIGNIFICANT EVENTS  COVID-19 DISASTER DECLARATION:   FULL CONTACT PHYSICAL EXAMINATION WAS NOT POSSIBLE DUE TO TREATMENT OF COVID-19 AND   CONSERVATION OF PERSONAL PROTECTIVE EQUIPMENT, LIMITED EXAM FINDINGS INCLUDE-   Patient assessed or the symptoms described in the history of present illness.   In the context of the Global COVID-19 pandemic, which necessitated consideration that the patient might be at risk for infection with the SARS-CoV-2 virus that causes COVID-19, Institutional protocols and algorithms that pertain to the evaluation of patients at risk for COVID-19 are in a state of rapid change based on information released by regulatory bodies including the CDC and federal and state organizations. These policies and algorithms were followed during the patient's care while in hospital.     MEDICATIONS: I have reviewed all medications and confirmed regimen as documented   CULTURE RESULTS   Recent Results (from the past 240 hour(s))  Blood culture (routine x 2)     Status: None   Collection Time: 03/19/19  3:01 PM   Specimen: BLOOD  Result Value Ref Range Status   Specimen Description BLOOD BLOOD LEFT FOREARM  Final   Special Requests   Final    BOTTLES DRAWN AEROBIC AND ANAEROBIC Blood Culture adequate volume   Culture   Final    NO GROWTH 5 DAYS Performed at [facility], [address] [place], [place]  [id number]    Report Status 03/24/2019 FINAL  Final  Blood culture (routine x 2)     Status: None   Collection Time: 03/19/19  3:01 PM   Specimen: BLOOD  Result Value Ref Range Status   Specimen Description BLOOD LEFT ANTECUBITAL  Final   Special Requests   Final    BOTTLES DRAWN AEROBIC AND ANAEROBIC Blood Culture adequate volume   Culture   Final    NO GROWTH 5 DAYS Performed  at [facility], [address] [place], [place] [address]    Report Status 03/24/2019 FINAL  Final  MRSA PCR Screening     Status: None   Collection Time: 03/20/19 11:05 AM   Specimen: Nasopharyngeal  Result Value Ref Range Status   MRSA by PCR NEGATIVE NEGATIVE Final    Comment:        The GeneXpert MRSA Assay (FDA approved for NASAL specimens only), is one component of a comprehensive MRSA colonization surveillance program. It is not intended to diagnose MRSA infection nor to guide or monitor treatment for MRSA infections. Performed at [facility], [address] [place], [place] [address]           ASSESSMENT AND PLAN SYNOPSIS   Severe ACUTE Hypoxic and Hypercapnic Respiratory Failure SEVERE ARDS, PNEUMONIA HIGH RISK FOR INTUBATION  ACUTE KIDNEY INJURY/Renal Failure -follow chem 7 -follow UO -continue Foley Catheter-assess need -Avoid nephrotoxic agents -Recheck creatinine   Severe COVID-19 infection, ARDS and pneumonia/pneumonitis Continue IV steroids  IV remdisivir Aggressive pulm toilet recommended OOB to chair as tolerated  Pulmonary hygiene Continue proning as tolerated due to severe hypoxia   Maintain airborne and contact precautions  As needed bronchodilators (MDI) Vitamin C and zinc Antitussives High risk for intubation and death    CARDIAC ICU monitoring  ID -continue IV abx as prescibed -follow up cultures  GI GI PROPHYLAXIS as indicated  NUTRITIONAL STATUS DIET--> as tolerated Constipation protocol as indicated  ENDO - will use ICU hypoglycemic\Hyperglycemia protocol if indicated   ELECTROLYTES -follow labs as needed -replace as needed -pharmacy consultation and following   DVT/GI PRX ordered TRANSFUSIONS AS NEEDED MONITOR FSBS ASSESS the need for LABS as needed   Critical Care Time devoted to patient care services described in this note is 36 minutes.   Overall, patient is critically ill,  prognosis is guarded.  high risk for cardiac arrest and death.    Corrin Parker, M.D.  [facility]  Medical Director [place] Director [facility] Cardio-Pulmonary Department

## 2019-03-24 NOTE — Progress Notes (Signed)
Physical Therapy Treatment Patient Details Name: Rebecca Kirby MRN: [id number] DOB: 27-Feb-1946 Today's Date: 03/24/2019    History of Present Illness 74 year old female with a history of chronic anemia, anxiety disorder, history of colon CA, GERD, hypothyroidism renal cell carcinoma history of thyroid CA, pancreatic CA status post Whipple's procedure, diabetes, B TKA, hernia came into the ED due to worsening hypoxemia post COVID-19 infection.   MD assessment includes acute hypoxemic respiratory failure due to COVID19 induced bilateral multifocal pneumonia.    PT Comments    Pt eager to work with PT and showed great effort t/o the session.  She needed frequent and at times prolonged rest breaks secondary to significant drops in O2 with activity or needing to take of re-breather to cough/blow nose/etc.  Pt's O2 generally stayed ~90% at rest and initially with light activity but consistently dropped to mid/low 80s and at times into the upper/mid 70s during activity and would need focused breathing rest breaks to recover.  Pt overall showed good strength, balance, mobility but was very limited by activity tolerance and rapid drops in O2 with even light activity.  Follow Up Recommendations  Home health PT;Supervision for mobility/OOB(per progress/activity tolerance, may need rehab)     Equipment Recommendations  None recommended by PT    Recommendations for Other Services       Precautions / Restrictions Precautions Precautions: Fall Precaution Comments: watch O2 sats Restrictions Weight Bearing Restrictions: No Other Position/Activity Restrictions: Per MD notes, OK to participate with PT/OT with SpO2 >80%.    Mobility  Bed Mobility Overal bed mobility: Modified Independent             General bed mobility comments: Pt was able to transition to sitting EOB without direct assist  Transfers Overall transfer level: Needs assistance Equipment used: Rolling walker (2  wheeled) Transfers: Sit to/from [facility] Sit to Stand: Supervision Stand pivot transfers: Supervision       General transfer comment: Pt was able to rise to standing from multiple surfaces w/o assist x 5  Ambulation/Gait             General Gait Details: nonly side stepping/transfer ambulation.  Pt's O2 not stable to tolerate prolonged ambulation, limited with non-rebreather reach   Stairs             Wheelchair Mobility    Modified Rankin (Stroke Patients Only)       Balance Overall balance assessment: Needs assistance Sitting-balance support: No upper extremity supported Sitting balance-Leahy Scale: Good     Standing balance support: Bilateral upper extremity supported Standing balance-Leahy Scale: Good Standing balance comment: Pt was able to maintain short bouts of static and dynamic standing tasks but again fatigue remains an issue                            Cognition Arousal/Alertness: Awake/alert Behavior During Therapy: WFL for tasks assessed/performed Overall Cognitive Status: Within Functional Limits for tasks assessed                                        Exercises Total Joint Exercises Ankle Circles/Pumps: AROM;10 reps;Both Heel Slides: Strengthening;10 reps;Both Hip ABduction/ADduction: Strengthening;10 reps;Both Knee Flexion: Strengthening;10 reps;Both Other Exercises Other Exercises: multiple brief standing efforts with marching in place, heel raises, weight shifts Other Exercises: Therapist facilitated education and implementation of learned ECS  to support ADL participation with pt verbalizing plan to use shower chair and handheld shower head for showering and will have daughter there to assist as needed Other Exercises: Pt instructed in home O2 use, and strategies to improve visibility of tubing and minimize falls risk Other Exercises: Pt instructed in BUE ther ex and required occasional  verbal cues for PLB and brief rest breaks to support breath recovery    General Comments        Pertinent Vitals/Pain Pain Assessment: No/denies pain    Home Living                      Prior Function            PT Goals (current goals can now be found in the care plan section) Acute Rehab PT Goals Patient Stated Goal: To get stronger and return home Progress towards PT goals: Progressing toward goals    Frequency    Min 2X/week      PT Plan Current plan remains appropriate    Co-evaluation              AM-PAC PT "6 Clicks" Mobility   Outcome Measure  Help needed turning from your back to your side while in a flat bed without using bedrails?: None Help needed moving from lying on your back to sitting on the side of a flat bed without using bedrails?: None Help needed moving to and from a bed to a chair (including a wheelchair)?: A Little Help needed standing up from a chair using your arms (e.g., wheelchair or bedside chair)?: A Little Help needed to walk in hospital room?: A Little Help needed climbing 3-5 steps with a railing? : A Lot 6 Click Score: 19    End of Session Equipment Utilized During Treatment: Oxygen(non-rebreather) Activity Tolerance: Patient limited by fatigue Patient left: in chair;with call bell/phone within reach Nurse Communication: Mobility status PT Visit Diagnosis: Difficulty in walking, not elsewhere classified (R26.2);Muscle weakness (generalized) (M62.81)     Time: [id number] PT Time Calculation (min) (ACUTE ONLY): 55 min  Charges:  $Therapeutic Exercise: 8-22 mins $Therapeutic Activity: 38-52 mins                     Kreg Shropshire, DPT 03/24/2019, 6:08 PM

## 2019-03-24 NOTE — Progress Notes (Signed)
Occupational Therapy Treatment Patient Details Name: Rebecca Kirby MRN: [id number] DOB: 1945/04/02 Today's Date: 03/24/2019    History of present illness Per MD notes: Pt is a 74 year old female with a history of chronic anemia, anxiety disorder, history of colon CA, GERD, hypothyroidism renal cell carcinoma history of thyroid CA, pancreatic CA status post Whipple's procedure, diabetes, B TKA, hernia came into the ED due to worsening hypoxemia post COVID-19 infection.   MD assessment includes acute hypoxemic respiratory failure due to COVID19 induced bilateral multifocal pneumonia.   OT comments  Pt seen for OT tx this date. Pt observed prior to OT session this morning to have taken non re-breather mask off in order to briefly blow her nose. In doing so, her O2 sats decreased sharply into the 60's%. Once reapplied, O2 sats began to slowly improve. RN aware, and reports that this has happened before. This afternoon when OT came for Tx session, pt denied SOB when she blew her nose earlier, despite the low O2 sats. Pt instructed in BUE there ex to maintain and improve her strength and activity tolerance for ADL. Pt noted with decreased O2 sats to 87-88% briefly, requiring cues for PLB and rest break to improve back to 90-91%. During rest breaks, instructed pt in the implementation of learned ECS into daily routines. Pt reports plan to utilize a shower chair for seated shower using a handheld shower head to support greater independence with showering while minimizing risk of SOB/over exertion/falls. Pt eager to return home stating, "I'll do better at home." Pt continues to benefit from skilled OT services to maximize return to PLOF. Continue to recommend HHOT pending continued progress with OT. Continues to be on 15L O2 via non-rebreather.   Follow Up Recommendations  Home health OT(pending additional progress towards goals and decreased O2 needs)    Equipment Recommendations  None recommended by OT     Recommendations for Other Services      Precautions / Restrictions Precautions Precautions: Fall Precaution Comments: watch O2 sats Restrictions Weight Bearing Restrictions: No Other Position/Activity Restrictions: Per MD notes, OK to participate with PT/OT with SpO2 >80%.       Mobility Bed Mobility               General bed mobility comments: pt declined, plans to get up with RN later for dinner  Transfers                 General transfer comment: pt declined, plans to get up with RN later for dinner    Balance                                           ADL either performed or assessed with clinical judgement   ADL Overall ADL's : Needs assistance/impaired                                       General ADL Comments: supervision to CGA for ADL tasks involving functional transfers, becomes easily fatigued, cues for PLB and recovery     Vision Baseline Vision/History: Wears glasses Wears Glasses: At all times Patient Visual Report: No change from baseline     Perception     Praxis      Cognition Arousal/Alertness: Awake/alert Behavior During Therapy: [facility] for tasks assessed/performed  Overall Cognitive Status: Within Functional Limits for tasks assessed                                          Exercises Other Exercises Other Exercises: Therapist facilitated education and implementation of learned ECS to support ADL participation with pt verbalizing plan to use shower chair and handheld shower head for showering and will have daughter there to assist as needed Other Exercises: Pt instructed in home O2 use, and strategies to improve visibility of tubing and minimize falls risk Other Exercises: Pt instructed in BUE ther ex and required occasional verbal cues for PLB and brief rest breaks to support breath recovery   Shoulder Instructions       General Comments      Pertinent Vitals/ Pain        Pain Assessment: No/denies pain  Home Living                                          Prior Functioning/Environment              Frequency  Min 1X/week        Progress Toward Goals  OT Goals(current goals can now be found in the care plan section)  Progress towards OT goals: Progressing toward goals  Acute Rehab OT Goals Patient Stated Goal: To get stronger and return home OT Goal Formulation: With patient Time For Goal Achievement: 04/03/19 Potential to Achieve Goals: Good  Plan Discharge plan remains appropriate;Frequency remains appropriate    Co-evaluation                 AM-PAC OT "6 Clicks" Daily Activity     Outcome Measure   Help from another person eating meals?: None Help from another person taking care of personal grooming?: None Help from another person toileting, which includes using toliet, bedpan, or urinal?: A Little Help from another person bathing (including washing, rinsing, drying)?: A Little Help from another person to put on and taking off regular upper body clothing?: None Help from another person to put on and taking off regular lower body clothing?: A Little 6 Click Score: 21    End of Session    OT Visit Diagnosis: Other abnormalities of gait and mobility (R26.89);Muscle weakness (generalized) (M62.81)   Activity Tolerance Patient tolerated treatment well   Patient Left in bed;with call bell/phone within reach;with bed alarm set   Nurse Communication          Time: [id number] OT Time Calculation (min): 21 min  Charges: OT General Charges $OT Visit: 1 Visit OT Treatments $Therapeutic Exercise: 8-22 mins  Jeni Salles, MPH, MS, OTR/L ascom [phone] 03/24/19, 2:45 PM

## 2019-03-24 NOTE — Progress Notes (Signed)
Patient alert and oriented. No complaints of pain except some throat discomfort. Md made aware and orders entered. Patient in on non-rebreathe, during exertion or eating oxygen drops in the 70's but does rebound back. Patient tolerating her diet. Has been incontinent and using bedside commode. PT worked with patient. Continue to monitor.

## 2019-03-25 ENCOUNTER — Inpatient Hospital Stay: Payer: Medicare Other

## 2019-03-25 DIAGNOSIS — J80 Acute respiratory distress syndrome: Secondary | ICD-10-CM

## 2019-03-25 LAB — HEPATIC FUNCTION PANEL
ALT: 31 U/L (ref 0–44)
AST: 42 U/L — ABNORMAL HIGH (ref 15–41)
Albumin: 2.3 g/dL — ABNORMAL LOW (ref 3.5–5.0)
Alkaline Phosphatase: 70 U/L (ref 38–126)
Bilirubin, Direct: 0.2 mg/dL (ref 0.0–0.2)
Indirect Bilirubin: 0.4 mg/dL (ref 0.3–0.9)
Total Bilirubin: 0.6 mg/dL (ref 0.3–1.2)
Total Protein: 5.9 g/dL — ABNORMAL LOW (ref 6.5–8.1)

## 2019-03-25 LAB — GLUCOSE, CAPILLARY
Glucose-Capillary: 216 mg/dL — ABNORMAL HIGH (ref 70–99)
Glucose-Capillary: 227 mg/dL — ABNORMAL HIGH (ref 70–99)
Glucose-Capillary: 244 mg/dL — ABNORMAL HIGH (ref 70–99)
Glucose-Capillary: 255 mg/dL — ABNORMAL HIGH (ref 70–99)
Glucose-Capillary: 260 mg/dL — ABNORMAL HIGH (ref 70–99)
Glucose-Capillary: 304 mg/dL — ABNORMAL HIGH (ref 70–99)

## 2019-03-25 LAB — CBC
HCT: 30.1 % — ABNORMAL LOW (ref 36.0–46.0)
Hemoglobin: 9.9 g/dL — ABNORMAL LOW (ref 12.0–15.0)
MCH: 27.4 pg (ref 26.0–34.0)
MCHC: 32.9 g/dL (ref 30.0–36.0)
MCV: 83.4 fL (ref 80.0–100.0)
Platelets: 246 10*3/uL (ref 150–400)
RBC: 3.61 MIL/uL — ABNORMAL LOW (ref 3.87–5.11)
RDW: 13.8 % (ref 11.5–15.5)
WBC: 13.5 10*3/uL — ABNORMAL HIGH (ref 4.0–10.5)
nRBC: 0 % (ref 0.0–0.2)

## 2019-03-25 LAB — BASIC METABOLIC PANEL
Anion gap: 13 (ref 5–15)
BUN: 68 mg/dL — ABNORMAL HIGH (ref 8–23)
CO2: 23 mmol/L (ref 22–32)
Calcium: 8.1 mg/dL — ABNORMAL LOW (ref 8.9–10.3)
Chloride: 99 mmol/L (ref 98–111)
Creatinine, Ser: 1.32 mg/dL — ABNORMAL HIGH (ref 0.44–1.00)
GFR calc Af Amer: 46 mL/min — ABNORMAL LOW (ref >60)
GFR calc non Af Amer: 39 mL/min — ABNORMAL LOW (ref >60)
Glucose, Bld: 255 mg/dL — ABNORMAL HIGH (ref 70–99)
Potassium: 5.1 mmol/L (ref 3.5–5.1)
Sodium: 135 mmol/L (ref 135–145)

## 2019-03-25 LAB — PHOSPHORUS: Phosphorus: 5.4 mg/dL — ABNORMAL HIGH (ref 2.5–4.6)

## 2019-03-25 LAB — MAGNESIUM: Magnesium: 2.3 mg/dL (ref 1.7–2.4)

## 2019-03-25 MED ORDER — IPRATROPIUM-ALBUTEROL 20-100 MCG/ACT IN AERS
1.0000 | INHALATION_SPRAY | RESPIRATORY_TRACT | Status: DC
  Administered 2019-03-25 – 2019-04-02 (×36): 1 via RESPIRATORY_TRACT
  Filled 2019-03-25 (×2): qty 4

## 2019-03-25 MED ORDER — PATIROMER SORBITEX CALCIUM 8.4 G PO PACK
8.4000 g | PACK | Freq: Once | ORAL | Status: AC
  Administered 2019-03-25: 8.4 g via ORAL
  Filled 2019-03-25: qty 1

## 2019-03-25 MED ORDER — AEROCHAMBER PLUS FLO-VU MISC
1.0000 | Freq: Once | Status: AC
  Administered 2019-03-25: 1
  Filled 2019-03-25: qty 1

## 2019-03-25 MED ORDER — SENNOSIDES-DOCUSATE SODIUM 8.6-50 MG PO TABS
2.0000 | ORAL_TABLET | Freq: Two times a day (BID) | ORAL | Status: DC
  Administered 2019-03-25 – 2019-03-27 (×3): 2 via ORAL
  Filled 2019-03-25 (×3): qty 2

## 2019-03-25 MED ORDER — ALBUTEROL SULFATE HFA 108 (90 BASE) MCG/ACT IN AERS
2.0000 | INHALATION_SPRAY | Freq: Four times a day (QID) | RESPIRATORY_TRACT | Status: DC | PRN
  Administered 2019-03-26: 2 via RESPIRATORY_TRACT
  Filled 2019-03-25: qty 6.7

## 2019-03-25 MED ORDER — CICLESONIDE 160 MCG/ACT IN AERS
2.0000 | INHALATION_SPRAY | Freq: Two times a day (BID) | RESPIRATORY_TRACT | Status: DC
  Administered 2019-03-25 – 2019-04-01 (×15): 2 via RESPIRATORY_TRACT
  Filled 2019-03-25: qty 6.1

## 2019-03-25 NOTE — Progress Notes (Signed)
CRITICAL CARE NOTE Patient description:  74 year old female with a history of chronic anemia, anxiety disorder, history of colon CA, GERD, hypothyroidism renal cell carcinoma history of thyroid CA, pancreatic CA status post Whipple's procedure, diabetes, came into the ED due to worsening hypoxemia post COVID-19 infection. Patient has been positive for COVID-19 since March 11, 2019  Patient generally does not like to go to the hospital and has initially declined to come in however her daughter was insisting that she comes in and get medical attention. In the ED she was noted to be 85% on 2 L which improved with increased supplemental oxygen satting at approximately 90% on 5 to 6 L/min nasal cannula. Chest x-ray was reviewed-consistent with multilobar bilateral airspace opacification consistent with COVID-19 induced pneumonia.   Lines / Drains: PIVx2  Cultures / Sepsis markers: Respiratory culture -in progress MRSA PCT - negative Blood culture x2 - NTDx24h -reincubation in progress  Antimicrobials: zithromax 500 PO daily  Vecluri x 5 days per protocol Ivermectin 9mg  po daily  Protocols / Consultants: PCCM Hospitalist  Tests / Events: [place] - Lower extermities - negative for DVT bilaterally   03/21/19- on NRB with some discomfort, decreased lasix due to euvolemia, CXR reviewed with improvement 03/22/19 - transitioned to HFNC for patient comfort, patient reports improved strength, no distress, O2 requirement unchanged from yesterday.  Will repeat CXR tommorow.  Finished Remdesevir today 1/25 daughter updated,severe hypoxia 1/26 remains hypoxic fio2 100% RR 20's 1/27 REMAINS HYPOXIC CXR B/L OPACITIES DAUGHTER [name] UPDATED DAILY  FiO2 (%):  [92 %] 92 %       CC  FOLLOW UP RESP FAILURE FOLLOW UP HYPOXIA  SUBJECTIVE REMAINS CRITICALLY ILL PROGNOSIS IS GUARDED SEVERE HYPOXIA    BP (!) 132/39   Pulse 66   Temp (!) 96.8 F (36 C) (Axillary)   Resp (!) 27   Ht 5\' 5"   (1.651 m)   Wt 98.3 kg   SpO2 95%   BMI 36.06 kg/m    I/O last 3 completed shifts: In: -  Out: 1350 [Urine:1350] No intake/output data recorded.  SpO2: 95 % O2 Flow Rate (L/min): 45 L/min FiO2 (%): 92 %   COVID-19 DISASTER DECLARATION:   FULL CONTACT PHYSICAL EXAMINATION WAS NOT POSSIBLE DUE TO TREATMENT OF COVID-19 AND   CONSERVATION OF PERSONAL PROTECTIVE EQUIPMENT, LIMITED EXAM FINDINGS INCLUDE-   Patient assessed or the symptoms described in the history of present illness.   In the context of the Global COVID-19 pandemic, which necessitated consideration that the patient might be at risk for infection with the SARS-CoV-2 virus that causes COVID-19, Institutional protocols and algorithms that pertain to the evaluation of patients at risk for COVID-19 are in a state of rapid change based on information released by regulatory bodies including the CDC and federal and state organizations. These policies and algorithms were followed during the patient's care while in hospital.      MEDICATIONS: I have reviewed all medications and confirmed regimen as documented   CULTURE RESULTS   Recent Results (from the past 240 hour(s))  Blood culture (routine x 2)     Status: None   Collection Time: 03/19/19  3:01 PM   Specimen: BLOOD  Result Value Ref Range Status   Specimen Description BLOOD BLOOD LEFT FOREARM  Final   Special Requests   Final    BOTTLES DRAWN AEROBIC AND ANAEROBIC Blood Culture adequate volume   Culture   Final    NO GROWTH 5 DAYS Performed at [facility]  [facility], [address] [place], [place] [address]    Report Status 03/24/2019 FINAL  Final  Blood culture (routine x 2)     Status: None   Collection Time: 03/19/19  3:01 PM   Specimen: BLOOD  Result Value Ref Range Status   Specimen Description BLOOD LEFT ANTECUBITAL  Final   Special Requests   Final    BOTTLES DRAWN AEROBIC AND ANAEROBIC Blood Culture adequate volume   Culture   Final    NO GROWTH  5 DAYS Performed at [facility], [address] [place], [place] [address]    Report Status 03/24/2019 FINAL  Final  MRSA PCR Screening     Status: None   Collection Time: 03/20/19 11:05 AM   Specimen: Nasopharyngeal  Result Value Ref Range Status   MRSA by PCR NEGATIVE NEGATIVE Final    Comment:        The GeneXpert MRSA Assay (FDA approved for NASAL specimens only), is one component of a comprehensive MRSA colonization surveillance program. It is not intended to diagnose MRSA infection nor to guide or monitor treatment for MRSA infections. Performed at [facility], [address] [place], [place] [address]           ASSESSMENT AND PLAN SYNOPSIS   Severe ACUTE Hypoxic and Hypercapnic Respiratory Failure SEVERE ARDS, PNEUMONIA HIGH RISK FOR INTUBATION  ACUTE KIDNEY INJURY/Renal Failure -follow chem 7 -follow UO -continue Foley Catheter-assess need -Avoid nephrotoxic agents  Severe COVID-19 infection, ARDS and pneumonia/pneumonitis Continue IV steroids  S/P IV remdisivir Aggressive pulm toilet recommended OOB to chair as tolerated  Pulmonary hygiene Continue proning as tolerated due to severe hypoxia   Maintain airborne and contact precautions  As needed bronchodilators (MDI) Vitamin C and zinc Antitussives High risk for intubation and death   CARDIAC ICU monitoring   GI GI PROPHYLAXIS as indicated  NUTRITIONAL STATUS DIET-->TF's as tolerated Constipation protocol as indicated  ELECTROLYTES -follow labs as needed -replace as needed -pharmacy consultation and following    DVT/GI PRX ordered TRANSFUSIONS AS NEEDED MONITOR FSBS ASSESS the need for LABS as needed     Critical Care Time devoted to patient care services described in this note is 34 minutes.   Overall, patient is critically ill, prognosis is guarded.   Corrin Parker, M.D.  [facility]  Medical Director [place] Director [facility] Cardio-Pulmonary Department

## 2019-03-25 NOTE — Progress Notes (Signed)
Physical Therapy Treatment Patient Details Name: Rebecca Kirby MRN: [id number] DOB: June 17, 1945 Today's Date: 03/25/2019    History of Present Illness 74 year old female with a history of chronic anemia, anxiety disorder, history of colon CA, GERD, hypothyroidism renal cell carcinoma history of thyroid CA, pancreatic CA status post Whipple's procedure, diabetes, B TKA, hernia came into the ED due to worsening hypoxemia post COVID-19 infection.   MD assessment includes acute hypoxemic respiratory failure due to COVID19 induced bilateral multifocal pneumonia.    PT Comments    Pt motivated to participate throughout the session but required frequent therapeutic rest breaks to address O2 desaturation.  Pt's baseline SpO2 was in the high 80s to low 90s on HFNC but dropped into the low 80s with therex and transfer training.  At one point pt began coughing while in sitting and SpO2 dropped to a low of 63% requiring 45-60 sec to return to the 80s with PLB, nursing notified.  Pt reported feeling more fatigued with activity this session and her BLEs were somewhat tremulous during transfer training but she was steady without LOB.  Pt will benefit from HHPT services upon discharge to safely address deficits listed in patient problem list for decreased caregiver assistance and eventual return to PLOF.    Follow Up Recommendations  Home health PT;Supervision for mobility/OOB     Equipment Recommendations  None recommended by PT    Recommendations for Other Services       Precautions / Restrictions Precautions Precautions: Fall Precaution Comments: watch O2 sats Restrictions Weight Bearing Restrictions: No Other Position/Activity Restrictions: Per MD notes, OK to participate with PT/OT with SpO2 >80%.    Mobility  Bed Mobility Overal bed mobility: Modified Independent             General bed mobility comments: Extra time and effort but no physical assistance required  Transfers Overall  transfer level: Needs assistance Equipment used: None Transfers: Stand Pivot Transfers   Stand pivot transfers: Min guard       General transfer comment: Pt presented with mildly tremulous BLEs during transfer training but was steady without LOB  Ambulation/Gait             General Gait Details: Deferred this session secondary to O2 desaturation   Stairs             Wheelchair Mobility    Modified Rankin (Stroke Patients Only)       Balance Overall balance assessment: Needs assistance Sitting-balance support: No upper extremity supported Sitting balance-Leahy Scale: Good     Standing balance support: Single extremity supported Standing balance-Leahy Scale: Good                              Cognition Arousal/Alertness: Awake/alert Behavior During Therapy: WFL for tasks assessed/performed Overall Cognitive Status: Within Functional Limits for tasks assessed                                        Exercises Total Joint Exercises Ankle Circles/Pumps: AROM;10 reps;Both;15 reps Quad Sets: Strengthening;Both;10 reps;15 reps Gluteal Sets: Strengthening;Both;10 reps;15 reps Heel Slides: Strengthening;10 reps;Both Hip ABduction/ADduction: Strengthening;10 reps;Both Long Arc Quad: Strengthening;Both;10 reps;15 reps Knee Flexion: Strengthening;10 reps;Both;15 reps Other Exercises Other Exercises: Multiple sit to/from stand and SPT transfers from various height surfaces Other Exercises: HEP education and review for BLE APs, QS, GS, and  LAQs x 10 each 5-6x/day Other Exercises: PLB education and review    General Comments        Pertinent Vitals/Pain Pain Assessment: No/denies pain    Home Living                      Prior Function            PT Goals (current goals can now be found in the care plan section) Progress towards PT goals: Progressing toward goals    Frequency    Min 2X/week      PT Plan  Current plan remains appropriate    Co-evaluation              AM-PAC PT "6 Clicks" Mobility   Outcome Measure  Help needed turning from your back to your side while in a flat bed without using bedrails?: None Help needed moving from lying on your back to sitting on the side of a flat bed without using bedrails?: None Help needed moving to and from a bed to a chair (including a wheelchair)?: A Little Help needed standing up from a chair using your arms (e.g., wheelchair or bedside chair)?: A Little Help needed to walk in hospital room?: A Little Help needed climbing 3-5 steps with a railing? : A Lot 6 Click Score: 19    End of Session Equipment Utilized During Treatment: Oxygen;Other (comment)(Pt refused gait belt) Activity Tolerance: Patient limited by fatigue Patient left: in chair;with call bell/phone within reach Nurse Communication: Mobility status;Other (comment)(O2 desaturation to 63% with coughing) PT Visit Diagnosis: Difficulty in walking, not elsewhere classified (R26.2);Muscle weakness (generalized) (M62.81)     Time: [id number] PT Time Calculation (min) (ACUTE ONLY): 39 min  Charges:  $Therapeutic Exercise: 23-37 mins $Therapeutic Activity: 8-22 mins                     D. Scott Haylei Cobin PT, DPT 03/25/19, 5:33 PM

## 2019-03-25 NOTE — Progress Notes (Signed)
Pt unavailable for CPT at this time, PT working with patient, attempting to get pt up to bedside chair and patient wanting to eat as well.

## 2019-03-25 NOTE — Consult Note (Signed)
PHARMACY CONSULT NOTE - FOLLOW UP  Pharmacy Consult for Electrolyte Monitoring and Replacement   Sheffield Slider. [place] is a 74 y.o. female admitted on 03/19/2019 due to worsening hypoxemia post COVID-19 infection, along with acute hypoxemic respiratory failure; pt tested positive on 03/11/2019. CXRs were consistent with COVID-19 induced pneumonia. PMH includes chronic anemia, anxiety disorder, hx of colon CA, pancreatic CA, diabetes, hypothyroidism, renal cell carcinoma, history of thyroid CA. Veltessa x 1 dose was given yesterday. Per am rounds, dexamethasone will be continued until a total duration of 10 days. Pt is now eating. Pharmacy consulted to assist with monitoring and replacing electrolytes.   Recent Labs: Potassium (mmol/L)  Date Value  03/25/2019 5.1  03/05/2014 4.6   Magnesium (mg/dL)  Date Value  03/25/2019 2.3   Calcium (mg/dL)  Date Value  03/25/2019 8.1 (L)   Calcium, Total (mg/dL)  Date Value  03/05/2014 9.5   Albumin (g/dL)  Date Value  03/25/2019 2.3 (L)  02/10/2014 3.1 (L)   Phosphorus (mg/dL)  Date Value  03/25/2019 5.4 (H)   Sodium (mmol/L)  Date Value  03/25/2019 135  03/05/2014 138     Assessment: 1. Electrolytes: Sodium levels WNL. Potassium levels have remained at 5.1 since yesterday, despite veltassa 8.4g PO x 1 dose. Another dose will be given today. Corrected calcium is 9.5 (WNL). Phosphorus levels have improved, but still remain high. Continue to monitor electrolytes in am labs. Will replace to achieve goal for potassium ~4, sodium ~140, and magnesium ~2.  2. Glucose: Today's range: 244-260 which is has not changed much from yesterday evening. Glucose-capillary is monitored Q4H. Pt is on insulin glargine 40 units [place] QHS and insulin aspart 0-15 units [place] TID with meals. Pt is on dexamethasone 6mg  IV Q12H (last dose on am of 03/30/2019). Continue insulin administrations and monitor glucose levels daily.    3. Constipation: Last BM: 01/23. Pt is not  currently on constipation meds. Pt is also eating now. Consider adding senna-docusate tomorrow to help with BM. Continue to monitor.    Roanna Banning ,PharmD Candidate 03/25/2019 11:02 AM

## 2019-03-26 LAB — HEPATIC FUNCTION PANEL
ALT: 30 U/L (ref 0–44)
AST: 43 U/L — ABNORMAL HIGH (ref 15–41)
Albumin: 2.4 g/dL — ABNORMAL LOW (ref 3.5–5.0)
Alkaline Phosphatase: 80 U/L (ref 38–126)
Bilirubin, Direct: 0.3 mg/dL — ABNORMAL HIGH (ref 0.0–0.2)
Indirect Bilirubin: 0.6 mg/dL (ref 0.3–0.9)
Total Bilirubin: 0.9 mg/dL (ref 0.3–1.2)
Total Protein: 6.4 g/dL — ABNORMAL LOW (ref 6.5–8.1)

## 2019-03-26 LAB — BASIC METABOLIC PANEL
Anion gap: 12 (ref 5–15)
Anion gap: 14 (ref 5–15)
BUN: 68 mg/dL — ABNORMAL HIGH (ref 8–23)
BUN: 71 mg/dL — ABNORMAL HIGH (ref 8–23)
CO2: 21 mmol/L — ABNORMAL LOW (ref 22–32)
CO2: 21 mmol/L — ABNORMAL LOW (ref 22–32)
Calcium: 8.4 mg/dL — ABNORMAL LOW (ref 8.9–10.3)
Calcium: 8.8 mg/dL — ABNORMAL LOW (ref 8.9–10.3)
Chloride: 100 mmol/L (ref 98–111)
Chloride: 99 mmol/L (ref 98–111)
Creatinine, Ser: 1.32 mg/dL — ABNORMAL HIGH (ref 0.44–1.00)
Creatinine, Ser: 1.39 mg/dL — ABNORMAL HIGH (ref 0.44–1.00)
GFR calc Af Amer: 46 mL/min — ABNORMAL LOW (ref >60)
GFR calc Af Amer: 43 mL/min — ABNORMAL LOW (ref >60)
GFR calc non Af Amer: 39 mL/min — ABNORMAL LOW (ref >60)
GFR calc non Af Amer: 37 mL/min — ABNORMAL LOW (ref >60)
Glucose, Bld: 263 mg/dL — ABNORMAL HIGH (ref 70–99)
Glucose, Bld: 202 mg/dL — ABNORMAL HIGH (ref 70–99)
Potassium: 6.2 mmol/L — ABNORMAL HIGH (ref 3.5–5.1)
Potassium: 4.9 mmol/L (ref 3.5–5.1)
Sodium: 133 mmol/L — ABNORMAL LOW (ref 135–145)
Sodium: 134 mmol/L — ABNORMAL LOW (ref 135–145)

## 2019-03-26 LAB — CBC
HCT: 33 % — ABNORMAL LOW (ref 36.0–46.0)
Hemoglobin: 10.4 g/dL — ABNORMAL LOW (ref 12.0–15.0)
MCH: 26.3 pg (ref 26.0–34.0)
MCHC: 31.5 g/dL (ref 30.0–36.0)
MCV: 83.3 fL (ref 80.0–100.0)
Platelets: 252 10*3/uL (ref 150–400)
RBC: 3.96 MIL/uL (ref 3.87–5.11)
RDW: 14.1 % (ref 11.5–15.5)
WBC: 13 10*3/uL — ABNORMAL HIGH (ref 4.0–10.5)
nRBC: 0 % (ref 0.0–0.2)

## 2019-03-26 LAB — GLUCOSE, CAPILLARY
Glucose-Capillary: 183 mg/dL — ABNORMAL HIGH (ref 70–99)
Glucose-Capillary: 209 mg/dL — ABNORMAL HIGH (ref 70–99)
Glucose-Capillary: 225 mg/dL — ABNORMAL HIGH (ref 70–99)
Glucose-Capillary: 238 mg/dL — ABNORMAL HIGH (ref 70–99)
Glucose-Capillary: 285 mg/dL — ABNORMAL HIGH (ref 70–99)
Glucose-Capillary: 318 mg/dL — ABNORMAL HIGH (ref 70–99)

## 2019-03-26 LAB — FUNGITELL, SERUM: Fungitell Result: <31 pg/mL (ref <80)

## 2019-03-26 MED ORDER — INSULIN GLARGINE 100 UNIT/ML ~~LOC~~ SOLN
45.0000 [IU] | Freq: Every day | SUBCUTANEOUS | Status: DC
  Administered 2019-03-26: 45 [IU] via SUBCUTANEOUS
  Filled 2019-03-26 (×2): qty 0.45

## 2019-03-26 MED ORDER — POLYETHYLENE GLYCOL 3350 17 G PO PACK: 17.0000 g | PACK | Freq: Every day | ORAL | Status: DC

## 2019-03-26 MED ORDER — SODIUM ZIRCONIUM CYCLOSILICATE 5 G PO PACK
10.0000 g | PACK | Freq: Two times a day (BID) | ORAL | Status: DC
  Administered 2019-03-26 – 2019-03-29 (×5): 10 g via ORAL
  Filled 2019-03-26 (×5): qty 2

## 2019-03-26 MED ORDER — INSULIN ASPART 100 UNIT/ML ~~LOC~~ SOLN
4.0000 [IU] | Freq: Three times a day (TID) | SUBCUTANEOUS | Status: DC
  Administered 2019-03-26 – 2019-03-27 (×2): 4 [IU] via SUBCUTANEOUS
  Filled 2019-03-26 (×2): qty 1

## 2019-03-26 MED ORDER — FUROSEMIDE 10 MG/ML IJ SOLN
40.0000 mg | Freq: Once | INTRAMUSCULAR | Status: AC
  Administered 2019-03-26: 40 mg via INTRAVENOUS
  Filled 2019-03-26: qty 4

## 2019-03-26 MED ORDER — PATIROMER SORBITEX CALCIUM 8.4 G PO PACK
8.4000 g | PACK | Freq: Two times a day (BID) | ORAL | Status: DC
  Administered 2019-03-26: 8.4 g via ORAL
  Filled 2019-03-26 (×2): qty 1

## 2019-03-26 NOTE — Plan of Care (Signed)

## 2019-03-26 NOTE — Progress Notes (Signed)
CRITICAL CARE NOTE Patient description:  74 year old female with a history of chronic anemia, anxiety disorder, history of colon CA, GERD, hypothyroidism renal cell carcinoma history of thyroid CA, pancreatic CA status post Whipple's procedure, diabetes, came into the ED due to worsening hypoxemia post COVID-19 infection. Patient has been positive for COVID-19 since March 11, 2019  Patient generally does not like to go to the hospital and has initially declined to come in however her daughter was insisting that she comes in and get medical attention. In the ED she was noted to be 85% on 2 L which improved with increased supplemental oxygen satting at approximately 90% on 5 to 6 L/min nasal cannula. Chest x-ray was reviewed-consistent with multilobar bilateral airspace opacification consistent with COVID-19 induced pneumonia.   Lines / Drains: PIVx2  Cultures / Sepsis markers: Respiratory culture -in progress MRSA PCT - negative Blood culture x2 - NTDx24h -reincubation in progress  Antimicrobials: zithromax 500 PO daily  Vecluri x 5 days per protocol Ivermectin 9mg  po daily  Protocols / Consultants: PCCM Hospitalist  Tests / Events: [place] - Lower extermities - negative for DVT bilaterally   03/21/19- on NRB with some discomfort, decreased lasix due to euvolemia, CXR reviewed with improvement 03/22/19 - transitioned to HFNC for patient comfort, patient reports improved strength, no distress, O2 requirement unchanged from yesterday.  Will repeat CXR tommorow.  Finished Remdesevir today 1/25 daughter updated,severe hypoxia 1/26 remains hypoxic fio2 100% RR 20's 1/27 REMAINS HYPOXIC CXR B/L OPACITIES DAUGHTER [name] UPDATED DAILY 1/28 remains very hypoxic  FiO2 (%):  [92 %-97 %] 97 %     CC Follow up severe resp failure  HPI Severe hypoxia Increased WOB High risk for intubation Severe ARDS    BP (!) 119/49 (BP Location: Left Arm)   Pulse 61   Temp 98.2 F (36.8 C)  (Axillary)   Resp 20   Ht 5\' 5"  (075-GRM m)   Wt 98.3 kg   SpO2 92%   BMI 36.06 kg/m    I/O last 3 completed shifts: In: 150 [P.O.:150] Out: 950 [Urine:950] No intake/output data recorded.  SpO2: 92 % O2 Flow Rate (L/min): 45 L/min FiO2 (%): 97 %   COVID-19 DISASTER DECLARATION:   FULL CONTACT PHYSICAL EXAMINATION WAS NOT POSSIBLE DUE TO TREATMENT OF COVID-19 AND   CONSERVATION OF PERSONAL PROTECTIVE EQUIPMENT, LIMITED EXAM FINDINGS INCLUDE-   Patient assessed or the symptoms described in the history of present illness.   In the context of the Global COVID-19 pandemic, which necessitated consideration that the patient might be at risk for infection with the SARS-CoV-2 virus that causes COVID-19, Institutional protocols and algorithms that pertain to the evaluation of patients at risk for COVID-19 are in a state of rapid change based on information released by regulatory bodies including the CDC and federal and state organizations. These policies and algorithms were followed during the patient's care while in hospital.       MEDICATIONS: I have reviewed all medications and confirmed regimen as documented   CULTURE RESULTS   Recent Results (from the past 240 hour(s))  Blood culture (routine x 2)     Status: None   Collection Time: 03/19/19  3:01 PM   Specimen: BLOOD  Result Value Ref Range Status   Specimen Description BLOOD BLOOD LEFT FOREARM  Final   Special Requests   Final    BOTTLES DRAWN AEROBIC AND ANAEROBIC Blood Culture adequate volume   Culture   Final    NO GROWTH 5  DAYS Performed at [facility], [address] [place], [place] [address]    Report Status 03/24/2019 FINAL  Final  Blood culture (routine x 2)     Status: None   Collection Time: 03/19/19  3:01 PM   Specimen: BLOOD  Result Value Ref Range Status   Specimen Description BLOOD LEFT ANTECUBITAL  Final   Special Requests   Final    BOTTLES DRAWN AEROBIC AND ANAEROBIC Blood Culture  adequate volume   Culture   Final    NO GROWTH 5 DAYS Performed at [facility] [address] [place], [place] [address]    Report Status 03/24/2019 FINAL  Final  MRSA PCR Screening     Status: None   Collection Time: 03/20/19 11:05 AM   Specimen: Nasopharyngeal  Result Value Ref Range Status   MRSA by PCR NEGATIVE NEGATIVE Final    Comment:        The GeneXpert MRSA Assay (FDA approved for NASAL specimens only), is one component of a comprehensive MRSA colonization surveillance program. It is not intended to diagnose MRSA infection nor to guide or monitor treatment for MRSA infections. Performed at [facility], [address] [place], [place] [address]       CBC    Component Value Date/Time   WBC 13.0 (H) 03/26/2019 0605   RBC 3.96 03/26/2019 0605   HGB 10.4 (L) 03/26/2019 0605   HGB 11.2 (L) 03/05/2014 0935   HCT 33.0 (L) 03/26/2019 0605   HCT 34.9 (L) 03/05/2014 0935   PLT 252 03/26/2019 0605   PLT 217 03/05/2014 0935   MCV 83.3 03/26/2019 0605   MCV 85 03/05/2014 0935   MCH 26.3 03/26/2019 0605   MCHC 31.5 03/26/2019 0605   RDW 14.1 03/26/2019 0605   RDW 15.4 (H) 03/05/2014 0935   LYMPHSABS 0.5 (L) 03/24/2019 0415   LYMPHSABS 1.7 03/05/2014 0935   MONOABS 0.3 03/24/2019 0415   MONOABS 0.4 03/05/2014 0935   EOSABS 0.0 03/24/2019 0415   EOSABS 0.3 03/05/2014 0935   BASOSABS 0.0 03/24/2019 0415   BASOSABS 0.1 03/05/2014 0935   BMP Latest Ref Rng & Units 03/26/2019 03/25/2019 03/24/2019  Glucose 70 - 99 mg/dL 263(H) 255(H) 152(H)  BUN 8 - 23 mg/dL 68(H) 68(H) 67(H)  Creatinine 0.44 - 1.00 mg/dL 1.32(H) 1.32(H) 1.48(H)  Sodium 135 - 145 mmol/L 133(L) 135 132(L)  Potassium 3.5 - 5.1 mmol/L 6.2(H) 5.1 5.1  Chloride 98 - 111 mmol/L 100 99 97(L)  CO2 22 - 32 mmol/L 21(L) 23 24  Calcium 8.9 - 10.3 mg/dL 8.4(L) 8.1(L) 7.6(L)        ASSESSMENT AND PLAN SYNOPSIS   Severe ACUTE Hypoxic and Hypercapnic Respiratory Failure SEVERE ARDS,  PNEUMONIA HIGH RISK FOR INTUBATION   Severe ACUTE Hypoxic and Hypercapnic Respiratory Failure On high flow [place] fio2 100% Severe ARDS   ACUTE KIDNEY INJURY/Renal Failure -follow chem 7 -follow UO -continue Foley Catheter-assess need -Avoid nephrotoxic agents   Severe COVID-19 infection, ARDS and pneumonia/pneumonitis Continue IV steroids  IV remdisivir Aggressive pulm toilet recommended OOB to chair as tolerated  Pulmonary hygiene Continue proning as tolerated due to severe hypoxia   Maintain airborne and contact precautions  As needed bronchodilators (MDI) Vitamin C and zinc Antitussives High risk for intubation and death   CARDIAC ICU monitoring   GI GI PROPHYLAXIS as indicated  NUTRITIONAL STATUS DIET-->TF's as tolerated Constipation protocol as indicated  ELECTROLYTES -follow labs as needed -replace as needed -pharmacy consultation and following  DVT/GI PRX ordered TRANSFUSIONS AS NEEDED MONITOR FSBS ASSESS the need for LABS as needed   Critical Care Time devoted to patient care services described in this note is 35 minutes.   Overall, patient is critically ill, prognosis is guarded.  Patient with Multiorgan failure and at high risk for cardiac arrest and death.    Corrin Parker, M.D.  [facility]  Medical Director [place] Director [facility] Cardio-Pulmonary Department

## 2019-03-26 NOTE — Progress Notes (Signed)
Unable to CPT do due to patient eating.

## 2019-03-26 NOTE — Consult Note (Addendum)
PHARMACY CONSULT NOTE - FOLLOW UP  Pharmacy Consult for Electrolyte Monitoring and Replacement   Rebecca Kirby. [place] is a 74 y.o. female admitted on 03/19/2019 due to worsening hypoxemia post COVID-19 infection, along with acute hypoxemic respiratory failure; pt tested positive on 03/11/2019. CXRs were consistent with COVID-19 induced pneumonia. PMH includes chronic anemia, anxiety disorder, hx of colon CA, pancreatic CA, diabetes, hypothyroidism, renal cell carcinoma, and history of thyroid CA.  Patient is on Phoslo at home for hyperphosphatemia.  Pharmacy consulted to assist with monitoring and replacing electrolytes.   Recent Labs: Potassium (mmol/L)  Date Value  03/26/2019 4.9  03/05/2014 4.6   Magnesium (mg/dL)  Date Value  03/25/2019 2.3   Calcium (mg/dL)  Date Value  03/26/2019 8.8 (L)   Calcium, Total (mg/dL)  Date Value  03/05/2014 9.5   Albumin (g/dL)  Date Value  03/26/2019 2.4 (L)  02/10/2014 3.1 (L)   Phosphorus (mg/dL)  Date Value  03/25/2019 5.4 (H)   Sodium (mmol/L)  Date Value  03/26/2019 134 (L)  03/05/2014 138    Assessment: 1. Electrolytes:  Goal of therapy:  all electrolytes WNL.  KCl has been consistently elevated x 3 days despite daily Veltassa.  Will start Lokelma 10 g BID, and per discussion in AM ICU rounds, will give Lasix 40 mg x 1 dose.   Will obtain electrolytes with AM labs.  2. Glucose:  SBG remains elevated (227 to 318).  Patient has diabetes and has been on IV steroids for 8/10 days with stop date 1/31.  Current orders include moderate SSI TIDWM and lantus 40 units QHS.  Per Diabetes Coordinator, will increase to lantus 45 units QHS and add novolog 4 units TIDWM.  Continue to monitor SBG daily.   3. Constipation:  Last BM 1/25, and is now eating.  Currently on senna-docusate 2 tab BID.  Will add Miralax.     Gerald Dexter ,PharmD  03/26/2019 2:10 PM

## 2019-03-26 NOTE — Progress Notes (Signed)
Physical Therapy Treatment Patient Details Name: Rebecca Kirby MRN: [id number] DOB: Feb 22, 1946 Today's Date: 03/26/2019    History of Present Illness 74 year old female with a history of chronic anemia, anxiety disorder, history of colon CA, GERD, hypothyroidism renal cell carcinoma history of thyroid CA, pancreatic CA status post Whipple's procedure, diabetes, B TKA, hernia came into the ED due to worsening hypoxemia post COVID-19 infection.   MD assessment includes acute hypoxemic respiratory failure due to COVID19 induced bilateral multifocal pneumonia.    PT Comments    Pt motivated to participate during the session but remained limited by O2 desaturation with activity.  Pt's SpO2 remained in the mid 80s to low 90s with therex but dropped to the mid 70s during ambulation.  Pt returned to sitting and began coughing with SpO2 dropping to the low 60s.  Once pt stopped coughing she was instructed in PLB technique with SpO2 returning to the mid 80s in 90 sec, nursing notified.  Pt will benefit from HHPT services upon discharge to safely address deficits listed in patient problem list for decreased caregiver assistance and eventual return to PLOF.    Follow Up Recommendations  Home health PT;Supervision for mobility/OOB     Equipment Recommendations  None recommended by PT    Recommendations for Other Services       Precautions / Restrictions Precautions Precautions: Fall Precaution Comments: watch O2 sats Restrictions Weight Bearing Restrictions: No Other Position/Activity Restrictions: Per MD notes, OK to participate with PT/OT with SpO2 >80%.    Mobility  Bed Mobility Overal bed mobility: Modified Independent             General bed mobility comments: Extra time and effort but no physical assistance required  Transfers Overall transfer level: Needs assistance Equipment used: Rolling walker (2 wheeled) Transfers: Sit to/from Stand Sit to Stand: Supervision          General transfer comment: Min verbal cues for sequencing with pt steady with grossly improved control and stability  Ambulation/Gait Ambulation/Gait assistance: Min guard Gait Distance (Feet): 5 Feet Assistive device: Rolling walker (2 wheeled) Gait Pattern/deviations: Step-through pattern;Decreased step length - right;Decreased step length - left Gait velocity: decreased   General Gait Details: Slow cadence but steady without LOB with SpO2 dropping into the mid 70s and then low 60s upon sitting during coughing spell   Stairs             Wheelchair Mobility    Modified Rankin (Stroke Patients Only)       Balance Overall balance assessment: Needs assistance Sitting-balance support: No upper extremity supported Sitting balance-Leahy Scale: Good     Standing balance support: Single extremity supported Standing balance-Leahy Scale: Good                              Cognition Arousal/Alertness: Awake/alert Behavior During Therapy: WFL for tasks assessed/performed Overall Cognitive Status: Within Functional Limits for tasks assessed                                        Exercises Total Joint Exercises Ankle Circles/Pumps: AROM;10 reps;Both;15 reps Quad Sets: Strengthening;Both;10 reps;15 reps Gluteal Sets: Strengthening;Both;10 reps;15 reps Heel Slides: Strengthening;10 reps;Both Hip ABduction/ADduction: Strengthening;10 reps;Both Long Arc Quad: Strengthening;Both;10 reps;15 reps Knee Flexion: Strengthening;10 reps;Both;15 reps Other Exercises Other Exercises: HEP education and review for BLE APs, QS, GS,  and LAQs x 10 each 5-6x/day    General Comments        Pertinent Vitals/Pain Pain Assessment: No/denies pain    Home Living                      Prior Function            PT Goals (current goals can now be found in the care plan section) Progress towards PT goals: Progressing toward goals    Frequency     Min 2X/week      PT Plan Current plan remains appropriate    Co-evaluation              AM-PAC PT "6 Clicks" Mobility   Outcome Measure  Help needed turning from your back to your side while in a flat bed without using bedrails?: None Help needed moving from lying on your back to sitting on the side of a flat bed without using bedrails?: None Help needed moving to and from a bed to a chair (including a wheelchair)?: A Little Help needed standing up from a chair using your arms (e.g., wheelchair or bedside chair)?: A Little Help needed to walk in hospital room?: A Little Help needed climbing 3-5 steps with a railing? : A Lot 6 Click Score: 19    End of Session Equipment Utilized During Treatment: Oxygen Activity Tolerance: Treatment limited secondary to medical complications (Comment)(O2 desaturation) Patient left: Other (comment)(Pt left on [facility] with nursing aware and entering to assist) Nurse Communication: Mobility status;Other (comment)(O2 desaturation) PT Visit Diagnosis: Difficulty in walking, not elsewhere classified (R26.2);Muscle weakness (generalized) (M62.81)     Time: [id number] PT Time Calculation (min) (ACUTE ONLY): 30 min  Charges:  $Therapeutic Exercise: 23-37 mins                     D. Royetta Asal PT, DPT 03/26/19, 4:37 PM

## 2019-03-26 NOTE — Progress Notes (Signed)
Inpatient Diabetes Program Recommendations  AACE/ADA: New Consensus Statement on Inpatient Glycemic Control (2015)  Target Ranges:  Prepandial:   less than 140 mg/dL      Peak postprandial:   less than 180 mg/dL (1-2 hours)      Critically ill patients:  140 - 180 mg/dL   Lab Results  Component Value Date   GLUCAP 318 (H) 03/26/2019   HGBA1C 7.4 (H) 03/19/2019    Review of Glycemic Control Results for KAIANA, DIGIANDOMENICO (MRN [id number]) as of 03/26/2019 13:34  Ref. Range 03/25/2019 19:38 03/25/2019 23:57 03/26/2019 03:33 03/26/2019 08:03 03/26/2019 10:58  Glucose-Capillary Latest Ref Range: 70 - 99 mg/dL 304 (H) 227 (H) 238 (H) 285 (H) 318 (H)   Diabetes history: DM2 Outpatient Diabetes medications: Humalog 18-34 units TID + Tresiba 90 units QD + Metformin 500 mg BID Current orders for Inpatient glycemic control: Novolog 0-15 tid + Lantus 40 units QHS + Decadron 6 mg Q12H  Inpatient Diabetes Program Recommendations:   Noted CBGs ranging from 227-318. Please consider: -Increase Lantus to 45 units qd -Novolog 4 units tid meal coverage if eats 50%  Thank you, Bethena Roys E. Marlyce Mcdougald, RN, MSN, CDE  Diabetes Coordinator Inpatient Glycemic Control Team Team Pager [phone] (8am-5pm) 03/26/2019 1:36 PM

## 2019-03-27 DIAGNOSIS — K717 Toxic liver disease with fibrosis and cirrhosis of liver: Secondary | ICD-10-CM

## 2019-03-27 LAB — GLUCOSE, CAPILLARY
Glucose-Capillary: 277 mg/dL — ABNORMAL HIGH (ref 70–99)
Glucose-Capillary: 305 mg/dL — ABNORMAL HIGH (ref 70–99)
Glucose-Capillary: 268 mg/dL — ABNORMAL HIGH (ref 70–99)
Glucose-Capillary: 125 mg/dL — ABNORMAL HIGH (ref 70–99)
Glucose-Capillary: 243 mg/dL — ABNORMAL HIGH (ref 70–99)
Glucose-Capillary: 213 mg/dL — ABNORMAL HIGH (ref 70–99)

## 2019-03-27 LAB — BASIC METABOLIC PANEL
Anion gap: 12 (ref 5–15)
BUN: 70 mg/dL — ABNORMAL HIGH (ref 8–23)
CO2: 22 mmol/L (ref 22–32)
Calcium: 8.3 mg/dL — ABNORMAL LOW (ref 8.9–10.3)
Chloride: 99 mmol/L (ref 98–111)
Creatinine, Ser: 1.34 mg/dL — ABNORMAL HIGH (ref 0.44–1.00)
GFR calc Af Amer: 45 mL/min — ABNORMAL LOW (ref >60)
GFR calc non Af Amer: 39 mL/min — ABNORMAL LOW (ref >60)
Glucose, Bld: 296 mg/dL — ABNORMAL HIGH (ref 70–99)
Potassium: 5.2 mmol/L — ABNORMAL HIGH (ref 3.5–5.1)
Sodium: 133 mmol/L — ABNORMAL LOW (ref 135–145)

## 2019-03-27 LAB — MAGNESIUM: Magnesium: 2.2 mg/dL (ref 1.7–2.4)

## 2019-03-27 LAB — PHOSPHORUS: Phosphorus: 6.6 mg/dL — ABNORMAL HIGH (ref 2.5–4.6)

## 2019-03-27 MED ORDER — INSULIN GLARGINE 100 UNIT/ML ~~LOC~~ SOLN
60.0000 [IU] | Freq: Every day | SUBCUTANEOUS | Status: DC
  Administered 2019-03-27 – 2019-03-29 (×3): 60 [IU] via SUBCUTANEOUS
  Filled 2019-03-27 (×4): qty 0.6

## 2019-03-27 MED ORDER — CALCIUM ACETATE (PHOS BINDER) 667 MG PO CAPS
1334.0000 mg | ORAL_CAPSULE | Freq: Three times a day (TID) | ORAL | Status: DC
  Administered 2019-03-27 – 2019-04-02 (×13): 1334 mg via ORAL
  Filled 2019-03-27 (×20): qty 2

## 2019-03-27 MED ORDER — INSULIN ASPART 100 UNIT/ML ~~LOC~~ SOLN
8.0000 [IU] | Freq: Three times a day (TID) | SUBCUTANEOUS | Status: DC
  Administered 2019-03-27 – 2019-03-30 (×10): 8 [IU] via SUBCUTANEOUS
  Filled 2019-03-27 (×7): qty 1

## 2019-03-27 MED ORDER — INSULIN GLARGINE 100 UNIT/ML ~~LOC~~ SOLN
10.0000 [IU] | Freq: Once | SUBCUTANEOUS | Status: AC
  Administered 2019-03-27: 10 [IU] via SUBCUTANEOUS
  Filled 2019-03-27: qty 0.1

## 2019-03-27 MED ORDER — PRO-STAT SUGAR FREE PO LIQD
30.0000 mL | Freq: Two times a day (BID) | ORAL | Status: DC
  Administered 2019-03-27 – 2019-03-31 (×6): 30 mL via ORAL

## 2019-03-27 MED ORDER — SENNOSIDES-DOCUSATE SODIUM 8.6-50 MG PO TABS
2.0000 | ORAL_TABLET | Freq: Every day | ORAL | Status: DC
  Administered 2019-03-28: 2 via ORAL
  Filled 2019-03-27 (×3): qty 2

## 2019-03-27 MED ORDER — FUROSEMIDE 10 MG/ML IJ SOLN
40.0000 mg | Freq: Once | INTRAMUSCULAR | Status: AC
  Administered 2019-03-27: 40 mg via INTRAVENOUS
  Filled 2019-03-27: qty 4

## 2019-03-27 NOTE — Progress Notes (Signed)
OT Cancellation Note  Patient Details Name: Rebecca Kirby MRN: [id number] DOB: 01/29/1946   Cancelled Treatment:    Reason Eval/Treat Not Completed: Medical issues which prohibited therapy. Chart reviewed. Pt noted with K+ at 5.2, falling outside guidelines for participation in therapy. Also noted to now be on 45L O2 via HFNC, FiO2 75%. Will re-attempt at later date/time as pt is appropriate.   Jeni Salles, MPH, MS, OTR/L ascom [phone] 03/27/19, 3:03 PM

## 2019-03-27 NOTE — Progress Notes (Signed)
PT Cancellation Note  Patient Details Name: Rebecca Kirby MRN: [id number] DOB: 1945/10/19   Cancelled Treatment:    Reason Eval/Treat Not Completed: Medical issues which prohibited therapy: Pt noted with K+ at 5.2 and trending up, falling outside guidelines for participation in therapy.  Will attempt to see pt at a future date/time as medically appropriate.     Linus Salmons PT, DPT 03/27/19, 4:54 PM

## 2019-03-27 NOTE — Progress Notes (Signed)
Nutrition Follow-up  DOCUMENTATION CODES:   Obesity unspecified  INTERVENTION:  Continue Nepro TID Continue MVI daily  Prostat 30 ml po BID, each supplement provides 100 kcal and 15 grams of protein  NUTRITION DIAGNOSIS:   Increased nutrient needs related to catabolic illness(Cancer, cirrhosis, COVID 19) as evidenced by estimated needs.  Addressing via oral nutrition supplements  GOAL:   Patient will meet greater than or equal to 90% of their needs  Progressing   MONITOR:   PO intake, Supplement acceptance, Labs, Weight trends, Skin, I & O's  REASON FOR ASSESSMENT:   Consult Assessment of nutrition requirement/status  ASSESSMENT:  RD working remotely.  74 y.o. 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 Proning as tolerated.   Patient on CM diet, eating 50-75% of meals at this time. She is provided Nepro 3 times daily to aid with meeting estimated needs, noted pt refusal of supplement this morning. Will add Prostat twice daily.   Mild BLE edema noted per RN flowsheet.  Admit wt 98.3 kg (216.26 lbs) No new weights since admission, recommend obtaining new wt to fully assess status.   Medications reviewed and include: Vit C 500 mg daily, Phoslo 1334 mg with meals, D3 1000 units daily, Decadron, SS novolog, Lantus 10 units daily, Lantus 60 units at bedtime, Magic mouthwash MVI, Sonokot, Miralax, Lokelma BID, B12  Labs: CBGs 268,305,277,209,225 x 24 hrs, Na 133 (L), K 5.2 (H), BUN 70 (H),  Cr 1.34 (H), PO4 6.6 (H)  Diet Order:   Diet Order            Diet Carb Modified Fluid consistency: Thin; Room service appropriate? Yes; Fluid restriction: 1200 mL Fluid  Diet effective now              EDUCATION NEEDS:   Education needs have been addressed  Skin:  Skin Assessment: Reviewed RN Assessment  Last BM:  1/28  Height:   Ht Readings from Last 1 Encounters:  03/19/19 5\' 5"  (1.651 m)    Weight:   Wt Readings from Last 1 Encounters:  03/19/19 98.3 kg    Ideal Body Weight:  56.8 kg  BMI:  Body mass index is 36.06 kg/m.  Estimated Nutritional Needs:   Kcal:  2000-2300kcal/day  Protein:  100-115g/day  Fluid:  1.4-1.7L/day   Lajuan Lines, RD, LDN Clinical Nutrition  [facility] Telephone [phone] After Hours/Weekend Pager: [phone]

## 2019-03-27 NOTE — Progress Notes (Signed)
[place] attempted visit, knowing Pt. has not been visited in several days; consulted RN to assess pt.'s sleeping status; pt. asleep; RN plans to enter room at 4:30pm; [place] will attempt visit another time.      03/27/19 1600  Clinical Encounter Type  Visited With Health care provider;Patient not available  Visit Type Follow-up  Referral From Chaplain

## 2019-03-27 NOTE — Consult Note (Signed)
PHARMACY CONSULT NOTE - FOLLOW UP  Pharmacy Consult for Electrolyte Monitoring and Replacement   Rebecca Kirby is a 74 y.o. female admitted on 03/19/2019 due to worsening hypoxemia post COVID-19 infection, along with acute hypoxemic respiratory failure; pt tested positive on 03/11/2019. CXRs were consistent with COVID-19 induced pneumonia. PMH includes chronic anemia, anxiety disorder, hx of colon CA, pancreatic CA, diabetes, hypothyroidism, renal cell carcinoma, and history of thyroid CA.  Patient is on Phoslo at home for hyperphosphatemia.  Pharmacy consulted to assist with monitoring and replacing electrolytes.   Recent Labs: Potassium (mmol/L)  Date Value  03/27/2019 5.2 (H)  03/05/2014 4.6   Magnesium (mg/dL)  Date Value  03/27/2019 2.2   Calcium (mg/dL)  Date Value  03/27/2019 8.3 (L)   Calcium, Total (mg/dL)  Date Value  03/05/2014 9.5   Albumin (g/dL)  Date Value  03/26/2019 2.4 (L)  02/10/2014 3.1 (L)   Phosphorus (mg/dL)  Date Value  03/27/2019 6.6 (H)   Sodium (mmol/L)  Date Value  03/27/2019 133 (L)  03/05/2014 138   Corrected Ca:  9.2  Assessment: 1. Electrolytes:  Goal of therapy:  all electrolytes WNL.   Potassium dropped from 6.2 to 5.1 on Veltassa x 1, Lokelma x 1 and Lasix 40 mg x 1.  Patient refused Lokelma this AM.  Will continue Lokelma BID, and per discussion in AM ICU rounds, will give another Lasix 40 mg x 1 dose.  Will increase phoslo to 1334 mg TIDWM.  Will obtain electrolytes with AM labs.  2. Glucose:  SBG remains elevated ~200-300.  Patient has diabetes and has been on IV steroids for 9/10 days with stop date 1/31.  Current orders include moderate SSI TIDWM, lantus 45 units QHS, and novolog 4 units TIDWM.  Patient received lantus 10 units x 1 this AM.  Per Diabetes Coordinator, will increase to novolog 8 units TIDWM and lantus 60 units QHS.  Continue to monitor SBG daily.   3. Constipation:  Last BM 1/28, and is now eating.  Currently on  senna-docusate 2 tab BID and miralax QHS.  Will d/c miralax and decrease senna-docusate.  Will continue to monitor.  Gerald Dexter ,PharmD  03/27/2019 2:29 PM

## 2019-03-27 NOTE — Progress Notes (Signed)
Inpatient Diabetes Program Recommendations  AACE/ADA: New Consensus Statement on Inpatient Glycemic Control (2015)  Target Ranges:  Prepandial:   less than 140 mg/dL      Peak postprandial:   less than 180 mg/dL (1-2 hours)      Critically ill patients:  140 - 180 mg/dL   Results for Rebecca Kirby, Rebecca Kirby (MRN [id number]) as of 03/27/2019 08:49  Ref. Range 03/25/2019 23:57 03/26/2019 03:33 03/26/2019 08:03 03/26/2019 10:58 03/26/2019 16:20 03/26/2019 19:45  Glucose-Capillary Latest Ref Range: 70 - 99 mg/dL 227 (H) 238 (H) 285 (H)  8 units NOVOLOG  318 (H)  11 units NOVOLOG  183 (H)  7 units NOVOLOG  225 (H)    45 units LANTUS given at 9:44pm   Results for Rebecca Kirby, Rebecca Kirby (MRN [id number]) as of 03/27/2019 08:49  Ref. Range 03/26/2019 23:37 03/27/2019 03:48 03/27/2019 08:19  Glucose-Capillary Latest Ref Range: 70 - 99 mg/dL 209 (H) 277 (H) 305 (H)     Home DM Meds: Tresiba 90 units QHS       Humalog 18-34 units TID       Metformin 500 mg BID  Current Orders: Lantus 45 units QHS      Novolog Moderate Correction Scale/ SSI (0-15 units) TID AC       Novolog 4 units TID with meals     Endocrinologist: Dr. Lucilla Lame with [name] seen 01/19/2019--No changes made to pt's insulin regimen at that visit--Patient was instructed to take the following: Metformin ER 500 mg BID Tresiba 90 units Daily Humalog 14 units with Breakfast/ 14 units with Lunch/ 24 units with Dinner Humalog 2 units for every 50 mg/dl >150 mg/dl      MD- Note patient getting Decadron 6 mg BID.  Note that Lantus increased last PM and Novolog Meal Coverage started yesterday at [place] with Dinner.  Please consider:  1. Increase Lantus to 60 units QHS (~70% total home dose)  2. Increase Novolog Meal Coverage to: Novolog 8 units TID with meals (takes much large doses at home)      --Will follow patient during hospitalization--  Wyn Quaker RN, MSN, CDE Diabetes Coordinator Inpatient Glycemic  Control Team Team Pager: [phone] (8a-5p)

## 2019-03-27 NOTE — Progress Notes (Signed)
CRITICAL CARE NOTE Patient description:  74 year old female with a history of chronic anemia, anxiety disorder, history of colon CA, GERD, hypothyroidism renal cell carcinoma history of thyroid CA, pancreatic CA status post Whipple's procedure, diabetes, came into the ED due to worsening hypoxemia post COVID-19 infection. Patient has been positive for COVID-19 since March 11, 2019  Patient generally does not like to go to the hospital and has initially declined to come in however her daughter was insisting that she comes in and get medical attention. In the ED she was noted to be 85% on 2 L which improved with increased supplemental oxygen satting at approximately 90% on 5 to 6 L/min nasal cannula. Chest x-ray was reviewed-consistent with multilobar bilateral airspace opacification consistent with COVID-19 induced pneumonia.   Lines / Drains: PIVx2  Cultures / Sepsis markers: Respiratory culture -in progress MRSA PCT - negative Blood culture x2 - NTDx24h -reincubation in progress  Antimicrobials: zithromax 500 PO daily  Vecluri x 5 days per protocol Ivermectin 9mg  po daily  Protocols / Consultants: PCCM Hospitalist  Tests / Events: [place] - Lower extermities - negative for DVT bilaterally   03/21/19- on NRB with some discomfort, decreased lasix due to euvolemia, CXR reviewed with improvement 03/22/19 - transitioned to HFNC for patient comfort, patient reports improved strength, no distress, O2 requirement unchanged from yesterday.  Will repeat CXR tommorow.  Finished Remdesevir today 1/25 daughter updated,severe hypoxia 1/26 remains hypoxic fio2 100% RR 20's 1/27 REMAINS HYPOXIC CXR B/L OPACITIES DAUGHTER [name] UPDATED DAILY 1/28 remains very hypoxic 1/29 remains hypoxic on high flow [place] fio2 at 75%  FiO2 (%):  [75 %-98 %] 75 %     CC Follow up ARDS  HPI Severe hypoxia Increased WOB High risk for intubation Severe ARDS     BP (!) 126/52   Pulse 65   Temp 98.2  F (36.8 C) (Axillary)   Resp 20   Ht 5\' 5"  (1.651 m)   Wt 98.3 kg   SpO2 98%   BMI 36.06 kg/m    I/O last 3 completed shifts: In: 150 [P.O.:150] Out: 200 [Urine:200] No intake/output data recorded.  SpO2: 98 % O2 Flow Rate (L/min): 45 L/min FiO2 (%): 75 %   COVID-19 DISASTER DECLARATION:   FULL CONTACT PHYSICAL EXAMINATION WAS NOT POSSIBLE DUE TO TREATMENT OF COVID-19 AND   CONSERVATION OF PERSONAL PROTECTIVE EQUIPMENT, LIMITED EXAM FINDINGS INCLUDE-   Patient assessed or the symptoms described in the history of present illness.   In the context of the Global COVID-19 pandemic, which necessitated consideration that the patient might be at risk for infection with the SARS-CoV-2 virus that causes COVID-19, Institutional protocols and algorithms that pertain to the evaluation of patients at risk for COVID-19 are in a state of rapid change based on information released by regulatory bodies including the CDC and federal and state organizations. These policies and algorithms were followed during the patient's care while in hospital.        MEDICATIONS: I have reviewed all medications and confirmed regimen as documented   CULTURE RESULTS   Recent Results (from the past 240 hour(s))  Blood culture (routine x 2)     Status: None   Collection Time: 03/19/19  3:01 PM   Specimen: BLOOD  Result Value Ref Range Status   Specimen Description BLOOD BLOOD LEFT FOREARM  Final   Special Requests   Final    BOTTLES DRAWN AEROBIC AND ANAEROBIC Blood Culture adequate volume   Culture   Final  NO GROWTH 5 DAYS Performed at [facility], [address] [place], [place] [address]    Report Status 03/24/2019 FINAL  Final  Blood culture (routine x 2)     Status: None   Collection Time: 03/19/19  3:01 PM   Specimen: BLOOD  Result Value Ref Range Status   Specimen Description BLOOD LEFT ANTECUBITAL  Final   Special Requests   Final    BOTTLES DRAWN AEROBIC AND ANAEROBIC  Blood Culture adequate volume   Culture   Final    NO GROWTH 5 DAYS Performed at [facility], [address] [place], [place] [address]    Report Status 03/24/2019 FINAL  Final  MRSA PCR Screening     Status: None   Collection Time: 03/20/19 11:05 AM   Specimen: Nasopharyngeal  Result Value Ref Range Status   MRSA by PCR NEGATIVE NEGATIVE Final    Comment:        The GeneXpert MRSA Assay (FDA approved for NASAL specimens only), is one component of a comprehensive MRSA colonization surveillance program. It is not intended to diagnose MRSA infection nor to guide or monitor treatment for MRSA infections. Performed at [facility], [address] [place], [place] [address]       CBC    Component Value Date/Time   WBC 13.0 (H) 03/26/2019 0605   RBC 3.96 03/26/2019 0605   HGB 10.4 (L) 03/26/2019 0605   HGB 11.2 (L) 03/05/2014 0935   HCT 33.0 (L) 03/26/2019 0605   HCT 34.9 (L) 03/05/2014 0935   PLT 252 03/26/2019 0605   PLT 217 03/05/2014 0935   MCV 83.3 03/26/2019 0605   MCV 85 03/05/2014 0935   MCH 26.3 03/26/2019 0605   MCHC 31.5 03/26/2019 0605   RDW 14.1 03/26/2019 0605   RDW 15.4 (H) 03/05/2014 0935   LYMPHSABS 0.5 (L) 03/24/2019 0415   LYMPHSABS 1.7 03/05/2014 0935   MONOABS 0.3 03/24/2019 0415   MONOABS 0.4 03/05/2014 0935   EOSABS 0.0 03/24/2019 0415   EOSABS 0.3 03/05/2014 0935   BASOSABS 0.0 03/24/2019 0415   BASOSABS 0.1 03/05/2014 0935   BMP Latest Ref Rng & Units 03/27/2019 03/26/2019 03/26/2019  Glucose 70 - 99 mg/dL 296(H) 202(H) 263(H)  BUN 8 - 23 mg/dL 70(H) 71(H) 68(H)  Creatinine 0.44 - 1.00 mg/dL 1.34(H) 1.39(H) 1.32(H)  Sodium 135 - 145 mmol/L 133(L) 134(L) 133(L)  Potassium 3.5 - 5.1 mmol/L 5.2(H) 4.9 6.2(H)  Chloride 98 - 111 mmol/L 99 99 100  CO2 22 - 32 mmol/L 22 21(L) 21(L)  Calcium 8.9 - 10.3 mg/dL 8.3(L) 8.8(L) 8.4(L)        ASSESSMENT AND PLAN SYNOPSIS  SEVERE ARDS and ACUTE HYPOXIC RESP FAILURE COVID 19  INFECTION AND PNEUMONIA HIGH RISK FOR INTUBATION  ACUTE KIDNEY INJURY/Renal Failure -follow chem 7 -follow UO -continue Foley Catheter-assess need -Avoid nephrotoxic agents  Severe COVID-19 infection, ARDS and pneumonia/pneumonitis Continue IV steroids  IV remdisivir Aggressive pulm toilet recommended OOB to chair as tolerated  Pulmonary hygiene Continue proning as tolerated due to severe hypoxia   Maintain airborne and contact precautions  As needed bronchodilators (MDI) Vitamin C and zinc Antitussives High risk for intubation and death   CARDIAC ICU monitoring    GI GI PROPHYLAXIS as indicated  NUTRITIONAL STATUS DIET-->as tolerated Constipation protocol as indicated   ELECTROLYTES -follow labs as needed -replace as needed -pharmacy consultation and following    DVT/GI PRX ordered TRANSFUSIONS AS NEEDED MONITOR FSBS ASSESS the need  for LABS as needed    Critical Care Time devoted to patient care services described in this note is 32 minutes.   Overall, patient is critically ill, prognosis is guarded.   high risk for cardiac arrest and death.    Corrin Parker, M.D.  [facility]  Medical Director [facility] Director [facility] Cardio-Pulmonary Department

## 2019-03-28 LAB — CBC WITH DIFFERENTIAL/PLATELET
Abs Immature Granulocytes: 0.2 10*3/uL — ABNORMAL HIGH (ref 0.00–0.07)
Basophils Absolute: 0 10*3/uL (ref 0.0–0.1)
Basophils Relative: 0 %
Eosinophils Absolute: 0 10*3/uL (ref 0.0–0.5)
Eosinophils Relative: 0 %
HCT: 29.5 % — ABNORMAL LOW (ref 36.0–46.0)
Hemoglobin: 9.5 g/dL — ABNORMAL LOW (ref 12.0–15.0)
Immature Granulocytes: 1 %
Lymphocytes Relative: 3 %
Lymphs Abs: 0.5 10*3/uL — ABNORMAL LOW (ref 0.7–4.0)
MCH: 26.5 pg (ref 26.0–34.0)
MCHC: 32.2 g/dL (ref 30.0–36.0)
MCV: 82.4 fL (ref 80.0–100.0)
Monocytes Absolute: 0.3 10*3/uL (ref 0.1–1.0)
Monocytes Relative: 2 %
Neutro Abs: 13 10*3/uL — ABNORMAL HIGH (ref 1.7–7.7)
Neutrophils Relative %: 94 %
Platelets: 234 10*3/uL (ref 150–400)
RBC: 3.58 MIL/uL — ABNORMAL LOW (ref 3.87–5.11)
RDW: 13.8 % (ref 11.5–15.5)
WBC: 14 10*3/uL — ABNORMAL HIGH (ref 4.0–10.5)
nRBC: 0 % (ref 0.0–0.2)

## 2019-03-28 LAB — GLUCOSE, CAPILLARY
Glucose-Capillary: 244 mg/dL — ABNORMAL HIGH (ref 70–99)
Glucose-Capillary: 221 mg/dL — ABNORMAL HIGH (ref 70–99)
Glucose-Capillary: 192 mg/dL — ABNORMAL HIGH (ref 70–99)
Glucose-Capillary: 222 mg/dL — ABNORMAL HIGH (ref 70–99)
Glucose-Capillary: 325 mg/dL — ABNORMAL HIGH (ref 70–99)
Glucose-Capillary: 245 mg/dL — ABNORMAL HIGH (ref 70–99)
Glucose-Capillary: 252 mg/dL — ABNORMAL HIGH (ref 70–99)

## 2019-03-28 LAB — BASIC METABOLIC PANEL
Anion gap: 12 (ref 5–15)
BUN: 81 mg/dL — ABNORMAL HIGH (ref 8–23)
CO2: 25 mmol/L (ref 22–32)
Calcium: 8.6 mg/dL — ABNORMAL LOW (ref 8.9–10.3)
Chloride: 99 mmol/L (ref 98–111)
Creatinine, Ser: 1.39 mg/dL — ABNORMAL HIGH (ref 0.44–1.00)
GFR calc Af Amer: 43 mL/min — ABNORMAL LOW (ref >60)
GFR calc non Af Amer: 37 mL/min — ABNORMAL LOW (ref >60)
Glucose, Bld: 237 mg/dL — ABNORMAL HIGH (ref 70–99)
Potassium: 4.9 mmol/L (ref 3.5–5.1)
Sodium: 136 mmol/L (ref 135–145)

## 2019-03-28 LAB — MAGNESIUM: Magnesium: 2.3 mg/dL (ref 1.7–2.4)

## 2019-03-28 LAB — PHOSPHORUS: Phosphorus: 6.5 mg/dL — ABNORMAL HIGH (ref 2.5–4.6)

## 2019-03-28 MED ORDER — INSULIN ASPART 100 UNIT/ML ~~LOC~~ SOLN
0.0000 [IU] | Freq: Every day | SUBCUTANEOUS | Status: DC
  Administered 2019-03-28: 2 [IU] via SUBCUTANEOUS
  Administered 2019-04-01 – 2019-04-05 (×4): 3 [IU] via SUBCUTANEOUS
  Administered 2019-04-06: 21:00:00 4 [IU] via SUBCUTANEOUS
  Filled 2019-03-28 (×7): qty 1

## 2019-03-28 NOTE — Progress Notes (Signed)
CRITICAL CARE NOTE Patient description:  74 year old female with a history of chronic anemia, anxiety disorder, history of colon CA, GERD, hypothyroidism renal cell carcinoma history of thyroid CA, pancreatic CA status post Whipple's procedure, diabetes, came into the ED due to worsening hypoxemia post COVID-19 infection. Patient has been positive for COVID-19 since March 11, 2019  Patient generally does not like to go to the hospital and has initially declined to come in however her daughter was insisting that she comes in and get medical attention. In the ED she was noted to be 85% on 2 L which improved with increased supplemental oxygen satting at approximately 90% on 5 to 6 L/min nasal cannula. Chest x-ray was reviewed-consistent with multilobar bilateral airspace opacification consistent with COVID-19 induced pneumonia.   Lines / Drains: PIVx2  Cultures / Sepsis markers: Respiratory culture -in progress MRSA PCT - negative Blood culture x2 - NTDx24h -reincubation in progress  Antimicrobials: zithromax 500 PO daily  Vecluri x 5 days per protocol Ivermectin 9mg  po daily  Protocols / Consultants: PCCM Hospitalist  Tests / Events: [place] - Lower extermities - negative for DVT bilaterally   03/21/19- on NRB with some discomfort, decreased lasix due to euvolemia, CXR reviewed with improvement 03/22/19 - transitioned to HFNC for patient comfort, patient reports improved strength, no distress, O2 requirement unchanged from yesterday.  Will repeat CXR tommorow.  Finished Remdesevir today 1/25 daughter updated,severe hypoxia 1/26 remains hypoxic fio2 100% RR 20's 1/27 REMAINS HYPOXIC CXR B/L OPACITIES DAUGHTER [name] UPDATED DAILY 1/28 remains very hypoxic 1/29 remains hypoxic on high flow [place] fio2 at 75% 1/30 remains hypoxic on high flow [place] fio2 at 80%   FiO2 (%):  [75 %-98 %] 80 %     CC Follow up ARDS  HPI Severe hypoxia Increased WOB High risk for intubation Severe  ARDS    BP (!) 116/46   Pulse 67   Temp 98.9 F (37.2 C) (Oral)   Resp 18   Ht 5\' 5"  (1.651 m)   Wt 98.3 kg   SpO2 92%   BMI 36.06 kg/m    I/O last 3 completed shifts: In: 270 [P.O.:270] Out: 1900 [Urine:1900] Total I/O In: -  Out: 250 [Urine:250]  SpO2: 92 % O2 Flow Rate (L/min): 45 L/min FiO2 (%): 80 %  COVID-19 DISASTER DECLARATION:   FULL CONTACT PHYSICAL EXAMINATION WAS NOT POSSIBLE DUE TO TREATMENT OF COVID-19 AND   CONSERVATION OF PERSONAL PROTECTIVE EQUIPMENT, LIMITED EXAM FINDINGS INCLUDE-   Patient assessed or the symptoms described in the history of present illness.   In the context of the Global COVID-19 pandemic, which necessitated consideration that the patient might be at risk for infection with the SARS-CoV-2 virus that causes COVID-19, Institutional protocols and algorithms that pertain to the evaluation of patients at risk for COVID-19 are in a state of rapid change based on information released by regulatory bodies including the CDC and federal and state organizations. These policies and algorithms were followed during the patient's care while in hospital.     MEDICATIONS: I have reviewed all medications and confirmed regimen as documented   CULTURE RESULTS   Recent Results (from the past 240 hour(s))  Blood culture (routine x 2)     Status: None   Collection Time: 03/19/19  3:01 PM   Specimen: BLOOD  Result Value Ref Range Status   Specimen Description BLOOD BLOOD LEFT FOREARM  Final   Special Requests   Final    BOTTLES DRAWN AEROBIC AND ANAEROBIC  Blood Culture adequate volume   Culture   Final    NO GROWTH 5 DAYS Performed at [facility], [address] [place], [place] [address]    Report Status 03/24/2019 FINAL  Final  Blood culture (routine x 2)     Status: None   Collection Time: 03/19/19  3:01 PM   Specimen: BLOOD  Result Value Ref Range Status   Specimen Description BLOOD LEFT ANTECUBITAL  Final   Special Requests    Final    BOTTLES DRAWN AEROBIC AND ANAEROBIC Blood Culture adequate volume   Culture   Final    NO GROWTH 5 DAYS Performed at [facility], [address] [place], [place] [address]    Report Status 03/24/2019 FINAL  Final  MRSA PCR Screening     Status: None   Collection Time: 03/20/19 11:05 AM   Specimen: Nasopharyngeal  Result Value Ref Range Status   MRSA by PCR NEGATIVE NEGATIVE Final    Comment:        The GeneXpert MRSA Assay (FDA approved for NASAL specimens only), is one component of a comprehensive MRSA colonization surveillance program. It is not intended to diagnose MRSA infection nor to guide or monitor treatment for MRSA infections. Performed at [facility], [address] [place], [place] [address]       CBC    Component Value Date/Time   WBC 14.0 (H) 03/28/2019 0447   RBC 3.58 (L) 03/28/2019 0447   HGB 9.5 (L) 03/28/2019 0447   HGB 11.2 (L) 03/05/2014 0935   HCT 29.5 (L) 03/28/2019 0447   HCT 34.9 (L) 03/05/2014 0935   PLT 234 03/28/2019 0447   PLT 217 03/05/2014 0935   MCV 82.4 03/28/2019 0447   MCV 85 03/05/2014 0935   MCH 26.5 03/28/2019 0447   MCHC 32.2 03/28/2019 0447   RDW 13.8 03/28/2019 0447   RDW 15.4 (H) 03/05/2014 0935   LYMPHSABS 0.5 (L) 03/28/2019 0447   LYMPHSABS 1.7 03/05/2014 0935   MONOABS 0.3 03/28/2019 0447   MONOABS 0.4 03/05/2014 0935   EOSABS 0.0 03/28/2019 0447   EOSABS 0.3 03/05/2014 0935   BASOSABS 0.0 03/28/2019 0447   BASOSABS 0.1 03/05/2014 0935   BMP Latest Ref Rng & Units 03/28/2019 03/27/2019 03/26/2019  Glucose 70 - 99 mg/dL 237(H) 296(H) 202(H)  BUN 8 - 23 mg/dL 81(H) 70(H) 71(H)  Creatinine 0.44 - 1.00 mg/dL 1.39(H) 1.34(H) 1.39(H)  Sodium 135 - 145 mmol/L 136 133(L) 134(L)  Potassium 3.5 - 5.1 mmol/L 4.9 5.2(H) 4.9  Chloride 98 - 111 mmol/L 99 99 99  CO2 22 - 32 mmol/L 25 22 21(L)  Calcium 8.9 - 10.3 mg/dL 8.6(L) 8.3(L) 8.8(L)        ASSESSMENT AND PLAN SYNOPSIS  SEVERE ARDS  and ACUTE HYPOXIC RESP FAILURE COVID 19 INFECTION AND PNEUMONIA HIGH RISK FOR INTUBATION Fio2 80%  Severe COVID-19 infection, ARDS and pneumonia/pneumonitis Continue IV steroids  Completed IV remdisivir Aggressive pulm toilet recommended OOB to chair as tolerated  Pulmonary hygiene Continue proning as tolerated due to severe hypoxia   Maintain airborne and contact precautions  As needed bronchodilators (MDI) Vitamin C and zinc Antitussives High risk for intubation and death   ACUTE KIDNEY INJURY/Renal Failure -follow chem 7 -follow UO -continue Foley Catheter-assess need -Avoid nephrotoxic agents Lasix as tolerated   GI GI PROPHYLAXIS as indicated  NUTRITIONAL STATUS DIET-->as tolerated Constipation protocol as indicated  ELECTROLYTES -follow labs as needed -replace as needed -pharmacy consultation and following  DVT/GI PRX ordered TRANSFUSIONS AS NEEDED MONITOR FSBS ASSESS the need for LABS as needed    Critical Care Time devoted to patient care services described in this note is 35 minutes.   Overall, patient is critically ill, prognosis is guarded.  Patient with Multiorgan failure and at high risk for cardiac arrest and death.    Corrin Parker, M.D.  [facility]  Medical Director [place] Director [facility] Cardio-Pulmonary Department

## 2019-03-28 NOTE — Consult Note (Signed)
PHARMACY CONSULT NOTE - FOLLOW UP  Pharmacy Consult for Electrolyte Monitoring and Replacement   Rebecca Kirby. Rebecca Kirby is a 74 y.o. female admitted on 03/19/2019 due to worsening hypoxemia post COVID-19 infection, along with acute hypoxemic respiratory failure; pt tested positive on 03/11/2019. CXRs were consistent with COVID-19 induced pneumonia. PMH includes chronic anemia, anxiety disorder, hx of colon CA, pancreatic CA, diabetes, hypothyroidism, renal cell carcinoma, and history of thyroid CA.  Patient is on Phoslo at home for hyperphosphatemia.  Pharmacy consulted to assist with monitoring and replacing electrolytes.   Recent Labs: Potassium (mmol/L)  Date Value  03/28/2019 4.9  03/05/2014 4.6   Magnesium (mg/dL)  Date Value  03/28/2019 2.3   Calcium (mg/dL)  Date Value  03/28/2019 8.6 (L)   Calcium, Total (mg/dL)  Date Value  03/05/2014 9.5   Albumin (g/dL)  Date Value  03/26/2019 2.4 (L)  02/10/2014 3.1 (L)   Phosphorus (mg/dL)  Date Value  03/28/2019 6.5 (H)   Sodium (mmol/L)  Date Value  03/28/2019 136  03/05/2014 138   Corrected Ca:    Assessment: 1. Electrolytes:  Goal of therapy:  all electrolytes WNL.   K 4.9 Mag 2.3  Phos 6.5 Scr 1.39 Will continue Lokelma BID.  Phoslo increased to 1334 mg TIDWM on 1/29.  Will obtain electrolytes with AM labs.  2. Glucose:  SBG remains elevated ~125-305.  Patient has diabetes and has been on IV steroids with stop date 1/31.  Current orders include moderate SSI TIDWM, lantus 60 units QHS, and novolog 8 units TIDWM.   Continue to monitor SBG daily.   3. Constipation:  Last BM 1/28, and is now eating.  Currently on  senna-docusate to 2 tabs Daily.  Will continue to monitor.  Cannan Beeck A ,PharmD  03/28/2019 10:19 AM

## 2019-03-29 LAB — CBC WITH DIFFERENTIAL/PLATELET
Abs Immature Granulocytes: 0.17 10*3/uL — ABNORMAL HIGH (ref 0.00–0.07)
Basophils Absolute: 0 10*3/uL (ref 0.0–0.1)
Basophils Relative: 0 %
Eosinophils Absolute: 0 10*3/uL (ref 0.0–0.5)
Eosinophils Relative: 0 %
HCT: 28.5 % — ABNORMAL LOW (ref 36.0–46.0)
Hemoglobin: 9.3 g/dL — ABNORMAL LOW (ref 12.0–15.0)
Immature Granulocytes: 1 %
Lymphocytes Relative: 3 %
Lymphs Abs: 0.4 10*3/uL — ABNORMAL LOW (ref 0.7–4.0)
MCH: 27 pg (ref 26.0–34.0)
MCHC: 32.6 g/dL (ref 30.0–36.0)
MCV: 82.8 fL (ref 80.0–100.0)
Monocytes Absolute: 0.4 10*3/uL (ref 0.1–1.0)
Monocytes Relative: 3 %
Neutro Abs: 14.7 10*3/uL — ABNORMAL HIGH (ref 1.7–7.7)
Neutrophils Relative %: 93 %
Platelets: 221 10*3/uL (ref 150–400)
RBC: 3.44 MIL/uL — ABNORMAL LOW (ref 3.87–5.11)
RDW: 13.8 % (ref 11.5–15.5)
WBC: 15.7 10*3/uL — ABNORMAL HIGH (ref 4.0–10.5)
nRBC: 0 % (ref 0.0–0.2)

## 2019-03-29 LAB — BASIC METABOLIC PANEL
Anion gap: 11 (ref 5–15)
BUN: 74 mg/dL — ABNORMAL HIGH (ref 8–23)
CO2: 24 mmol/L (ref 22–32)
Calcium: 8.4 mg/dL — ABNORMAL LOW (ref 8.9–10.3)
Chloride: 98 mmol/L (ref 98–111)
Creatinine, Ser: 1.12 mg/dL — ABNORMAL HIGH (ref 0.44–1.00)
GFR calc Af Amer: 56 mL/min — ABNORMAL LOW (ref >60)
GFR calc non Af Amer: 48 mL/min — ABNORMAL LOW (ref >60)
Glucose, Bld: 235 mg/dL — ABNORMAL HIGH (ref 70–99)
Potassium: 4.7 mmol/L (ref 3.5–5.1)
Sodium: 133 mmol/L — ABNORMAL LOW (ref 135–145)

## 2019-03-29 LAB — GLUCOSE, CAPILLARY
Glucose-Capillary: 205 mg/dL — ABNORMAL HIGH (ref 70–99)
Glucose-Capillary: 228 mg/dL — ABNORMAL HIGH (ref 70–99)
Glucose-Capillary: 130 mg/dL — ABNORMAL HIGH (ref 70–99)

## 2019-03-29 LAB — PHOSPHORUS: Phosphorus: 5.3 mg/dL — ABNORMAL HIGH (ref 2.5–4.6)

## 2019-03-29 NOTE — Progress Notes (Signed)
Physical Therapy Treatment Patient Details Name: Rebecca Kirby MRN: [id number] DOB: Oct 03, 1945 Today's Date: 03/29/2019    History of Present Illness 74 year old female with a history of chronic anemia, anxiety disorder, history of colon CA, GERD, hypothyroidism renal cell carcinoma history of thyroid CA, pancreatic CA status post Whipple's procedure, diabetes, B TKA, hernia came into the ED due to worsening hypoxemia post COVID-19 infection.   MD assessment includes acute hypoxemic respiratory failure due to COVID19 induced bilateral multifocal pneumonia.    PT Comments    Pt eager to work with PT despite obvious frustration with her situation and limitations.  She continues to show good relative strength with mobility, transfers, etc but is extremely quick to fatigue with O2 sats dropping into the 50s or 60s with even very modest activity.  Pt with some labored breathing during these transitions and subjective fatigue but surprisingly she felt better than numbers indicate.  Spoke with nursing about tolerances for sats vs activity.  Goal to maintain >80%, pt could not do this with just the HFNC, during activity/recovery we used the non-rebreather (sats would often climb to the high 90s with this combination).  She could then maintain 80s on just the HFNC during seated rest breaks and very light seated/supine exercises (with frequent rest stoppages).  Pt did show more weakness today during transfers to standing needing some assist to rise but overall she maintain good LE strength and reasonable static sitting/standing balance.  Pt very thankful to have been able to do some sitting, standing, exercises and get to the [facility] for BM (needed assist for self care and rest breaks before even completing such tasks).    Follow Up Recommendations  Home health PT;Supervision for mobility/OOB     Equipment Recommendations       Recommendations for Other Services       Precautions / Restrictions  Precautions Precautions: Fall Precaution Comments: watch O2 sats Restrictions Weight Bearing Restrictions: No    Mobility  Bed Mobility Overal bed mobility: Modified Independent             General bed mobility comments: Extra time and effort but no physical assistance required.  Pt's O2 dropped to the 50s with this effort (on HFNC) she was able to very slowly bring this up with concentrated effort but could not breach 80% until PT re-applied non-rebreather and her vitals did climb to the 90s  Transfers Overall transfer level: Needs assistance Equipment used: Rolling walker (2 wheeled) Transfers: Sit to/from Stand Sit to Stand: Min assist         General transfer comment: Pt unable to rise w/o assist from bed on first attempt, light assist on second with cuing and encouragement.  Pt was able to rise multiple times with only min assist and heavy use of UEs   Ambulation/Gait             General Gait Details: Pt able to take a few steps to/from [facility] but desats extremely quickly with even this modest effort and could not tolerate any form of pronged standing.  Sats dropped to 50s or 60s within 30 seconds of standing each time   Stairs             Wheelchair Mobility    Modified Rankin (Stroke Patients Only)       Balance Overall balance assessment: Needs assistance Sitting-balance support: No upper extremity supported Sitting balance-Leahy Scale: Good Sitting balance - Comments: Pt able to maintain static sitting balance w/o issue  from a balance stand-point   Standing balance support: Bilateral upper extremity supported Standing balance-Leahy Scale: Fair Standing balance comment: Pt less steady today with standing, reliant on walker and very quick to fatigue and need to sit.  No LOBs or buckling, but with little tolerace                            Cognition Arousal/Alertness: Awake/alert Behavior During Therapy: WFL for tasks  assessed/performed Overall Cognitive Status: Within Functional Limits for tasks assessed                                        Exercises Total Joint Exercises Ankle Circles/Pumps: AROM;10 reps(pt reports she has been doing these some on her own) Heel Slides: Strengthening;10 reps;Both(rest breaks between sets of 5) Hip ABduction/ADduction: Strengthening;10 reps;Both    General Comments        Pertinent Vitals/Pain Pain Assessment: No/denies pain    Home Living                      Prior Function            PT Goals (current goals can now be found in the care plan section) Progress towards PT goals: Progressing toward goals    Frequency    Min 2X/week      PT Plan Current plan remains appropriate    Co-evaluation              AM-PAC PT "6 Clicks" Mobility   Outcome Measure  Help needed turning from your back to your side while in a flat bed without using bedrails?: None Help needed moving from lying on your back to sitting on the side of a flat bed without using bedrails?: None Help needed moving to and from a bed to a chair (including a wheelchair)?: A Little Help needed standing up from a chair using your arms (e.g., wheelchair or bedside chair)?: A Little Help needed to walk in hospital room?: A Little Help needed climbing 3-5 steps with a railing? : A Lot 6 Click Score: 19    End of Session Equipment Utilized During Treatment: Oxygen(HFNC + non re-breather to recover post activity) Activity Tolerance: Treatment limited secondary to medical complications (Comment)(O2 sats very quick to drop with any activity) Patient left: in bed;with call bell/phone within reach Nurse Communication: Mobility status;Other (comment)(O2 updates and re-breather strategy) PT Visit Diagnosis: Difficulty in walking, not elsewhere classified (R26.2);Muscle weakness (generalized) (M62.81)     Time: [id number] PT Time Calculation (min) (ACUTE ONLY):  45 min  Charges:  $Therapeutic Exercise: 8-22 mins $Therapeutic Activity: 23-37 mins                     Kreg Shropshire, DPT 03/29/2019, 2:04 PM

## 2019-03-29 NOTE — Consult Note (Signed)
PHARMACY CONSULT NOTE - FOLLOW UP  Pharmacy Consult for Electrolyte Monitoring and Replacement   Sheffield Slider. Tedrick is a 74 y.o. female admitted on 03/19/2019 due to worsening hypoxemia post COVID-19 infection, along with acute hypoxemic respiratory failure; pt tested positive on 03/11/2019. CXRs were consistent with COVID-19 induced pneumonia. PMH includes chronic anemia, anxiety disorder, hx of colon CA, pancreatic CA, diabetes, hypothyroidism, renal cell carcinoma, and history of thyroid CA.  Patient is on Phoslo at home for hyperphosphatemia.  Pharmacy consulted to assist with monitoring and replacing electrolytes.   Recent Labs: Potassium (mmol/L)  Date Value  03/29/2019 4.7  03/05/2014 4.6   Magnesium (mg/dL)  Date Value  03/28/2019 2.3   Calcium (mg/dL)  Date Value  03/29/2019 8.4 (L)   Calcium, Total (mg/dL)  Date Value  03/05/2014 9.5   Albumin (g/dL)  Date Value  03/26/2019 2.4 (L)  02/10/2014 3.1 (L)   Phosphorus (mg/dL)  Date Value  03/29/2019 5.3 (H)   Sodium (mmol/L)  Date Value  03/29/2019 133 (L)  03/05/2014 138   Corrected Ca:  9.68 mg/dL  Assessment: 1. Electrolytes:  Goal of therapy:  all electrolytes WNL.   Stop Lokelma  Phoslo increased to 1334 mg TIDWM on 1/29: improving Will obtain electrolytes with AM labs.  2. Glucose:  SBG remains elevated 192 - 325.  Patient has diabetes and has been on IV steroids with stop date 1/31.  Current orders include moderate SSI TIDWM, lantus 60 units QHS, and novolog 8 units TIDWM.   Continue to monitor SBG daily.   3. Constipation:  Last BM 1/28, and is now eating.  Currently on  senna-docusate to 2 tabs Daily.  Will continue to monitor.  Dallie Piles ,PharmD  03/29/2019 11:13 AM

## 2019-03-29 NOTE — Progress Notes (Addendum)
CRITICAL CARE NOTE 74 year old female with a history of chronic anemia, anxiety disorder, history of colon CA, GERD, hypothyroidism renal cell carcinoma history of thyroid CA, pancreatic CA status post Whipple's procedure, diabetes, came into the ED due to worsening hypoxemia post COVID-19 infection. Patient has been positive for COVID-19 since March 11, 2019  Patient generally does not like to go to the hospital and has initially declined to come in however her daughter was insisting that she comes in and get medical attention. In the ED she was noted to be 85% on 2 L which improved with increased supplemental oxygen satting at approximately 90% on 5 to 6 L/min nasal cannula. Chest x-ray was reviewed-consistent with multilobar bilateral airspace opacification consistent with COVID-19 induced pneumonia.   Lines / Drains: PIVx2  Cultures / Sepsis markers: Respiratory culture -in progress MRSA PCT - negative Blood culture x2 - NTDx24h -reincubation in progress  Antimicrobials: zithromax 500 PO daily  Vecluri x 5 days per protocol Ivermectin 9mg  po daily  Protocols / Consultants: PCCM Hospitalist  Tests / Events: [place] - Lower extermities - negative for DVT bilaterally   03/21/19- on NRB with some discomfort, decreased lasix due to euvolemia, CXR reviewed with improvement 03/22/19 -transitioned to HFNC for patient comfort, patient reports improved strength, no distress, O2 requirement unchanged from yesterday. Will repeat CXR tommorow. Finished Remdesevir today 1/25 daughter updated,severe hypoxia 1/26 remains hypoxic fio2 100% RR 20's 1/27 REMAINS HYPOXIC CXR B/L OPACITIES DAUGHTER [name] UPDATED DAILY 1/28 remains very hypoxic 1/29 remains hypoxic on high flow [place] fio2 at 75% 1/30 remains hypoxic on high flow  fio2 at 80%      CC  follow up respiratory failure  SUBJECTIVE Patient remains critically ill Prognosis is guarded Severe ARDS   BP (!) 130/57 (BP  Location: Left Arm)   Pulse 77   Temp 98.1 F (36.7 C) (Oral)   Resp (!) 21   Ht 5\' 5"  (1.651 m)   Wt 98.3 kg   SpO2 92%   BMI 36.06 kg/m    I/O last 3 completed shifts: In: -  Out: 3400 [Urine:3400] No intake/output data recorded.  SpO2: 92 % O2 Flow Rate (L/min): 40 L/min FiO2 (%): 70 %   COVID-19 DISASTER DECLARATION:   FULL CONTACT PHYSICAL EXAMINATION WAS NOT POSSIBLE DUE TO TREATMENT OF COVID-19 AND   CONSERVATION OF PERSONAL PROTECTIVE EQUIPMENT, LIMITED EXAM FINDINGS INCLUDE-   Patient assessed or the symptoms described in the history of present illness.   In the context of the Global COVID-19 pandemic, which necessitated consideration that the patient might be at risk for infection with the SARS-CoV-2 virus that causes COVID-19, Institutional protocols and algorithms that pertain to the evaluation of patients at risk for COVID-19 are in a state of rapid change based on information released by regulatory bodies including the CDC and federal and state organizations. These policies and algorithms were followed during the patient's care while in hospital.     MEDICATIONS: I have reviewed all medications and confirmed regimen as documented   CULTURE RESULTS   Recent Results (from the past 240 hour(s))  Blood culture (routine x 2)     Status: None   Collection Time: 03/19/19  3:01 PM   Specimen: BLOOD  Result Value Ref Range Status   Specimen Description BLOOD BLOOD LEFT FOREARM  Final   Special Requests   Final    BOTTLES DRAWN AEROBIC AND ANAEROBIC Blood Culture adequate volume   Culture   Final  NO GROWTH 5 DAYS Performed at [facility], [address] [place], [place] [address]    Report Status 03/24/2019 FINAL  Final  Blood culture (routine x 2)     Status: None   Collection Time: 03/19/19  3:01 PM   Specimen: BLOOD  Result Value Ref Range Status   Specimen Description BLOOD LEFT ANTECUBITAL  Final   Special Requests   Final    BOTTLES  DRAWN AEROBIC AND ANAEROBIC Blood Culture adequate volume   Culture   Final    NO GROWTH 5 DAYS Performed at [facility], [address] [place], [place] [address]    Report Status 03/24/2019 FINAL  Final  MRSA PCR Screening     Status: None   Collection Time: 03/20/19 11:05 AM   Specimen: Nasopharyngeal  Result Value Ref Range Status   MRSA by PCR NEGATIVE NEGATIVE Final    Comment:        The GeneXpert MRSA Assay (FDA approved for NASAL specimens only), is one component of a comprehensive MRSA colonization surveillance program. It is not intended to diagnose MRSA infection nor to guide or monitor treatment for MRSA infections. Performed at [facility], [address] [place], [place] [address]           ASSESSMENT AND PLAN SYNOPSIS SEVERE ARDS and ACUTE HYPOXIC RESP FAILURE COVID 19 INFECTION AND PNEUMONIA HIGH RISK FOR INTUBATION Fio2 70%   Severe COVID-19 infection, ARDS and pneumonia/pneumonitis Continue IV steroids  S/p IV remdisivir Aggressive pulm toilet recommended OOB to chair as tolerated  Pulmonary hygiene Continue proning as tolerated due to severe hypoxia   Maintain airborne and contact precautions  As needed bronchodilators (MDI) Vitamin C and zinc Antitussives High risk for intubation and death     ACUTE KIDNEY INJURY/Renal Failure -follow chem 7 -follow UO -continue Foley Catheter-assess need -Avoid nephrotoxic agents -Recheck creatinine  Lasix as tolerated   CARDIAC ICU monitoring    GI GI PROPHYLAXIS as indicated  NUTRITIONAL STATUS DIET--> as tolerated Constipation protocol as indicated  ENDO - will use ICU hypoglycemic\Hyperglycemia protocol if indicated   ELECTROLYTES -follow labs as needed -replace as needed -pharmacy consultation and following   DVT/GI PRX ordered TRANSFUSIONS AS NEEDED MONITOR FSBS ASSESS the need for LABS as needed   Critical Care Time devoted to patient care  services described in this note is 32 minutes.   Overall, patient is critically ill, prognosis is guarded.  Patient with Multiorgan failure and at high risk for cardiac arrest and death.    Corrin Parker, M.D.  [facility]  Medical Director [place] Director [facility] Cardio-Pulmonary Department

## 2019-03-30 LAB — RENAL FUNCTION PANEL
Albumin: 2.3 g/dL — ABNORMAL LOW (ref 3.5–5.0)
Anion gap: 7 (ref 5–15)
BUN: 68 mg/dL — ABNORMAL HIGH (ref 8–23)
CO2: 30 mmol/L (ref 22–32)
Calcium: 8.6 mg/dL — ABNORMAL LOW (ref 8.9–10.3)
Chloride: 98 mmol/L (ref 98–111)
Creatinine, Ser: 1.19 mg/dL — ABNORMAL HIGH (ref 0.44–1.00)
GFR calc Af Amer: 52 mL/min — ABNORMAL LOW (ref >60)
GFR calc non Af Amer: 45 mL/min — ABNORMAL LOW (ref >60)
Glucose, Bld: 226 mg/dL — ABNORMAL HIGH (ref 70–99)
Phosphorus: 5.6 mg/dL — ABNORMAL HIGH (ref 2.5–4.6)
Potassium: 5.1 mmol/L (ref 3.5–5.1)
Sodium: 135 mmol/L (ref 135–145)

## 2019-03-30 LAB — CBC
HCT: 28.8 % — ABNORMAL LOW (ref 36.0–46.0)
Hemoglobin: 9.3 g/dL — ABNORMAL LOW (ref 12.0–15.0)
MCH: 26.9 pg (ref 26.0–34.0)
MCHC: 32.3 g/dL (ref 30.0–36.0)
MCV: 83.2 fL (ref 80.0–100.0)
Platelets: 200 10*3/uL (ref 150–400)
RBC: 3.46 MIL/uL — ABNORMAL LOW (ref 3.87–5.11)
RDW: 13.9 % (ref 11.5–15.5)
WBC: 13.5 10*3/uL — ABNORMAL HIGH (ref 4.0–10.5)
nRBC: 0 % (ref 0.0–0.2)

## 2019-03-30 LAB — GLUCOSE, CAPILLARY
Glucose-Capillary: 221 mg/dL — ABNORMAL HIGH (ref 70–99)
Glucose-Capillary: 198 mg/dL — ABNORMAL HIGH (ref 70–99)
Glucose-Capillary: 134 mg/dL — ABNORMAL HIGH (ref 70–99)
Glucose-Capillary: 154 mg/dL — ABNORMAL HIGH (ref 70–99)

## 2019-03-30 LAB — PHOSPHORUS: Phosphorus: 5.2 mg/dL — ABNORMAL HIGH (ref 2.5–4.6)

## 2019-03-30 LAB — MAGNESIUM: Magnesium: 2.2 mg/dL (ref 1.7–2.4)

## 2019-03-30 MED ORDER — INSULIN DETEMIR 100 UNIT/ML ~~LOC~~ SOLN
32.0000 [IU] | Freq: Two times a day (BID) | SUBCUTANEOUS | Status: DC
  Administered 2019-03-30: 32 [IU] via SUBCUTANEOUS
  Filled 2019-03-30 (×4): qty 0.32

## 2019-03-30 MED ORDER — SODIUM ZIRCONIUM CYCLOSILICATE 5 G PO PACK
10.0000 g | PACK | Freq: Two times a day (BID) | ORAL | Status: DC
  Administered 2019-03-30 (×2): 10 g via ORAL
  Filled 2019-03-30 (×3): qty 2

## 2019-03-30 NOTE — Consult Note (Signed)
PHARMACY CONSULT NOTE - FOLLOW UP  Pharmacy Consult for Electrolyte Monitoring and Replacement   Rebecca Kirby is a 74 y.o. female admitted on 03/19/2019 due to worsening hypoxemia post COVID-19 infection, along with acute hypoxemic respiratory failure; pt tested positive on 03/11/2019. CXRs were consistent with COVID-19 induced pneumonia. PMH includes chronic anemia, anxiety disorder, hx of colon CA, pancreatic CA, diabetes, hypothyroidism, renal cell carcinoma, and history of thyroid CA.  Patient is on Phoslo at home for hyperphosphatemia.  Pharmacy consulted to assist with monitoring and replacing electrolytes.   Recent Labs: Potassium (mmol/L)  Date Value  03/30/2019 5.1  03/05/2014 4.6   Magnesium (mg/dL)  Date Value  03/30/2019 2.2   Calcium (mg/dL)  Date Value  03/30/2019 8.6 (L)   Calcium, Total (mg/dL)  Date Value  03/05/2014 9.5   Albumin (g/dL)  Date Value  03/30/2019 2.3 (L)  02/10/2014 3.1 (L)   Phosphorus (mg/dL)  Date Value  03/30/2019 5.6 (H)  03/30/2019 5.2 (H)   Sodium (mmol/L)  Date Value  03/30/2019 135  03/05/2014 138   Corrected Ca:  9.96 mg/dL  Assessment: 1. Electrolytes:  Goal of therapy:  all electrolytes WNL.    Potassium is trending back up: restart Lokelma 10 g BID  Phoslo increased to 1334 mg TIDWM on 1/29: improving Will obtain electrolytes with AM labs.  2. Glucose:  SBG remains elevated but improved over the previous 24 hours (130 - 228).  Patient has diabetes and has been on IV steroids with stop date 1/31.  Current orders include moderate SSI TIDWM, lantus 60 units QHS changed to Levemir 32 units BID, and novolog 8 units TIDWM.   Continue to monitor SBG daily.   3. Constipation:  Last BM 1/31  Currently on  senna-docusate to 2 tabs Daily: stop for now   Will continue to monitor.  Dallie Piles ,PharmD  03/30/2019 7:25 AM

## 2019-03-30 NOTE — Progress Notes (Addendum)
Inpatient Diabetes Program Recommendations  AACE/ADA: New Consensus Statement on Inpatient Glycemic Control (2015)  Target Ranges:  Prepandial:   less than 140 mg/dL      Peak postprandial:   less than 180 mg/dL (1-2 hours)      Critically ill patients:  140 - 180 mg/dL   Lab Results  Component Value Date   GLUCAP 198 (H) 03/30/2019   HGBA1C 7.4 (H) 03/19/2019    Review of Glycemic Control Results for ZOEY, LATCHFORD (MRN [id number]) as of 03/30/2019 11:57  Ref. Range 03/29/2019 09:36 03/29/2019 11:47 03/29/2019 21:17 03/30/2019 08:07 03/30/2019 11:19  Glucose-Capillary Latest Ref Range: 70 - 99 mg/dL 205 (H) 228 (H) 130 (H) 221 (H) 198 (H)   Diabetes history: pancreatic CA status post Whipple's procedure, DM 2 Outpatient Diabetes medications: Tresiba 90 units qhs, Metformin 500 mg bid, Humalog 18-34 units tid Current orders for Inpatient glycemic control:  Lantus 60 units qhs Novolog 0-15 units tid + hs Novolog 8 units tid meal coverage  BUN/Creat: 68/1.19 Nepro tid between meals No longer on steroids  Inpatient Diabetes Program Recommendations:    -Consider changing Lantus to Levemir 32 units bid  Decadron may be active still for another 24 hours. May need to titrate doses tomorrow, however pt is on lower doses currently than what she takes at home.  Thanks,  Tama Headings RN, MSN, BC-ADM Inpatient Diabetes Coordinator Team Pager [phone] (8a-5p)

## 2019-03-30 NOTE — Progress Notes (Signed)
CRITICAL CARE NOTE 74 year old female with a history of chronic anemia, anxiety disorder, history of colon CA, GERD, hypothyroidism renal cell carcinoma history of thyroid CA, pancreatic CA status post Whipple's procedure, diabetes, came into the ED due to worsening hypoxemia post COVID-19 infection. Patient has been positive for COVID-19 since March 11, 2019  Patient generally does not like to go to the hospital and has initially declined to come in however her daughter was insisting that she comes in and get medical attention. In the ED she was noted to be 85% on 2 L which improved with increased supplemental oxygen satting at approximately 90% on 5 to 6 L/min nasal cannula. Chest x-ray was reviewed-consistent with multilobar bilateral airspace opacification consistent with COVID-19 pneumonia.   Lines / Drains: PIVx2  Cultures / Sepsis markers: Respiratory culture -negative so far MRSA PCT - negative Blood culture x2 -no growth  Antimicrobials: Vecluri completed protocol   Protocols / Consultants: PCCM Hospitalist  Tests / Events: [place] - Lower extermities - negative for DVT bilaterally   03/21/19- on NRB with some discomfort, decreased lasix due to euvolemia, CXR reviewed with improvement 03/22/19 -transitioned to HFNC for patient comfort, patient reports improved strength, no distress, O2 requirement unchanged from yesterday. Will repeat CXR tommorow. Finished Remdesevir today 1/25 daughter updated,severe hypoxia 1/26 remains hypoxic fio2 100% RR 20's 1/27 REMAINS HYPOXIC CXR B/L OPACITIES DAUGHTER [name] UPDATED DAILY 1/28 remains very hypoxic 1/29 remains hypoxic on high flow [place] fio2 at 75% 1/30 remains hypoxic on high flow [place] fio2 at 80% 2/01 persistent hypoxemia however was able to be up in chair most of the day, FiO2 down to 55%   CC  follow up respiratory failure  SUBJECTIVE Patient remains in stepdown due to requirements with high flow O2 Prognosis is  guarded Severe ARDS/delayed pulmonary phase of Covid   BP (!) 114/35   Pulse 76   Temp 97.6 F (36.4 C) (Axillary)   Resp (!) 25   Ht 5\' 5"  (1.651 m)   Wt 98.3 kg   SpO2 92%   BMI 36.06 kg/m    I/O last 3 completed shifts: In: 31 [P.O.:720] Out: 400 [Urine:400] No intake/output data recorded.  SpO2: 92 % O2 Flow Rate (L/min): 45 L/min FiO2 (%): 55 %   COVID-19 DISASTER DECLARATION:   FULL CONTACT PHYSICAL EXAMINATION WAS NOT POSSIBLE DUE TO TREATMENT OF COVID-19 AND   CONSERVATION OF PERSONAL PROTECTIVE EQUIPMENT, LIMITED EXAM FINDINGS INCLUDE-   Patient assessed or the symptoms described in the history of present illness.   In the context of the Global COVID-19 pandemic, which necessitated consideration that the patient might be at risk for infection with the SARS-CoV-2 virus that causes COVID-19, Institutional protocols and algorithms that pertain to the evaluation of patients at risk for COVID-19 are in a state of rapid change based on information released by regulatory bodies including the CDC and federal and state organizations. These policies and algorithms were followed during the patient's care while in hospital.     MEDICATIONS: I have reviewed all medications and confirmed regimen as documented   CULTURE RESULTS   No results found for this or any previous visit (from the past 240 hour(s)).    IMAGING    No results found.     Indwelling Urinary Catheter continued, requirement due to   Reason to continue Indwelling Urinary Catheter strict Intake/Output monitoring for hemodynamic instability   Central Line/ continued, requirement due to  Reason to continue [employer] of central  venous pressure or other hemodynamic parameters and poor IV access   Ventilator  N/AA   Ventilator Sedation  N/AA      ASSESSMENT AND PLAN SYNOPSIS SEVERE ARDS and ACUTE HYPOXIC RESP FAILURE COVID 19 INFECTION AND PNEUMONIA HIGH RISK FOR INTUBATION Fio2  55 %   Severe COVID-19 infection, ARDS and pneumonia/pneumonitis/delayed pulmonary phase Completed IV steroids S/p IV remdisivir Aggressive pulm toilet continued OOB to chair as tolerated  Pulmonary hygiene Continue proning as tolerated due to severe hypoxia   Maintain airborne and contact precautions  Ciclesonide and Combivent (MDI) Vitamin C and zinc Antitussives High risk for intubation and death   ACUTE KIDNEY INJURY/Renal Failure follow chem 7 follow UO continue Foley Catheter-assess need Avoid nephrotoxic agents Recheck creatinine  Lasix as tolerated   CARDIAC ICU/SDU monitoring  GI GI PROPHYLAXIS as indicated  NUTRITIONAL STATUS DIET--> as tolerated Constipation protocol as indicated  ENDO - will use ICU hypoglycemic\Hyperglycemia protocol if indicated   ELECTROLYTES -follow labs as needed -replace as needed -pharmacy consultation and following   DVT/GI PRX ordered TRANSFUSIONS AS NEEDED MONITOR FSBS ASSESS the need for LABS as needed   Discussed during multidisciplinary rounds.  Overall, patient is critically ill, prognosis is guarded.  Persistent hypoxemia in the setting of COVID-19, overall improving.    Renold Don, MD [place] [facility]   *This note was dictated using voice recognition software/Dragon.  Despite best efforts to proofread, errors can occur which can change the meaning.  Any change was purely unintentional.

## 2019-03-30 NOTE — Plan of Care (Signed)
  Problem: Coping: Goal: Psychosocial and spiritual needs will be supported 03/30/2019 0158 by Gerhard Perches, RN Outcome: Progressing 03/30/2019 0158 by Gerhard Perches, RN Outcome: Progressing 03/30/2019 0156 by Gerhard Perches, RN Outcome: Progressing   Problem: Respiratory: Goal: Will maintain a patent airway 03/30/2019 0158 by Gerhard Perches, RN Outcome: Progressing 03/30/2019 0158 by Gerhard Perches, RN Outcome: Progressing 03/30/2019 0156 by Gerhard Perches, RN Outcome: Progressing   Problem: Respiratory: Goal: Complications related to the disease process, condition or treatment will be avoided or minimized 03/30/2019 0158 by Gerhard Perches, RN Outcome: Progressing 03/30/2019 0158 by Gerhard Perches, RN Outcome: Progressing 03/30/2019 0156 by Gerhard Perches, RN Outcome: Progressing   Problem: Coping: Goal: Level of anxiety will decrease 03/30/2019 0158 by Gerhard Perches, RN Outcome: Progressing 03/30/2019 0158 by Gerhard Perches, RN Outcome: Progressing 03/30/2019 0156 by Gerhard Perches, RN Outcome: Progressing

## 2019-03-31 LAB — GLUCOSE, CAPILLARY
Glucose-Capillary: 58 mg/dL — ABNORMAL LOW (ref 70–99)
Glucose-Capillary: 81 mg/dL (ref 70–99)
Glucose-Capillary: 161 mg/dL — ABNORMAL HIGH (ref 70–99)
Glucose-Capillary: 133 mg/dL — ABNORMAL HIGH (ref 70–99)

## 2019-03-31 LAB — RENAL FUNCTION PANEL
Albumin: 2.3 g/dL — ABNORMAL LOW (ref 3.5–5.0)
Anion gap: 10 (ref 5–15)
BUN: 70 mg/dL — ABNORMAL HIGH (ref 8–23)
CO2: 27 mmol/L (ref 22–32)
Calcium: 9 mg/dL (ref 8.9–10.3)
Chloride: 99 mmol/L (ref 98–111)
Creatinine, Ser: 1.18 mg/dL — ABNORMAL HIGH (ref 0.44–1.00)
GFR calc Af Amer: 52 mL/min — ABNORMAL LOW (ref >60)
GFR calc non Af Amer: 45 mL/min — ABNORMAL LOW (ref >60)
Glucose, Bld: 66 mg/dL — ABNORMAL LOW (ref 70–99)
Phosphorus: 4.3 mg/dL (ref 2.5–4.6)
Potassium: 4.3 mmol/L (ref 3.5–5.1)
Sodium: 136 mmol/L (ref 135–145)

## 2019-03-31 NOTE — Progress Notes (Signed)
Occupational Therapy Treatment Patient Details Name: Rebecca Kirby MRN: [id number] DOB: December 03, 1945 Today's Date: 03/31/2019    History of present illness 74 year old female with a history of chronic anemia, anxiety disorder, history of colon CA, GERD, hypothyroidism renal cell carcinoma history of thyroid CA, pancreatic CA status post Whipple's procedure, diabetes, B TKA, hernia came into the ED due to worsening hypoxemia post COVID-19 infection.   MD assessment includes acute hypoxemic respiratory failure due to COVID19 induced bilateral multifocal pneumonia.   OT comments  Pt seen for OT tx this date to imporve pt tolerance as it pertains to ADL mobility as well as improve breathing techniques for fxl tasks. Pt on HFNC (8L initially, bumped up to 10L with activity, left on 8L while resting in chair, see precaution section for increased detail). OT instructs pt on PLB and postural exercise to improve surface area to take deeper breaths. Overall, HHOT continues to bed most prudent d/c dispo pending pt improvement with O2 needs.    Follow Up Recommendations  Home health OT(pending progress with O2 needs)    Equipment Recommendations  None recommended by OT    Recommendations for Other Services      Precautions / Restrictions Precautions Precautions: Fall Precaution Comments: watch O2 sats(on 8L HFNC, bumped to 10L HFNC with activity, desatted to 76% spO2 at the lowest point when still on 8L, but on average, once elevated, resolved to >90% in 3 minutes with only HFNC with seated rest break.) Restrictions Weight Bearing Restrictions: No Other Position/Activity Restrictions: Per MD notes, OK to participate with PT/OT with SpO2 >80%.       Mobility Bed Mobility Overal bed mobility: Modified Independent             General bed mobility comments: pt already sitting in recliner upion arriving. Just was able to get OOB after lunch.  Transfers Overall transfer level: Needs  assistance Equipment used: Rolling walker (2 wheeled) Transfers: Sit to/from Stand Sit to Stand: Mod assist         General transfer comment: Pt tolerates 3 sit<>stand trials with first one 1-2 mins, and subsequent 2 trials lasting ~15-20 seconds. Pt requires 3 min seated rest break on average for O2 to recover (see precautions section)    Balance Overall balance assessment: Needs assistance Sitting-balance support: No upper extremity supported;Feet supported Sitting balance-Leahy Scale: Good Sitting balance - Comments: pt sat in recliner with feet on floor and without UE support x several minutes during session while recovering from standing without LOB.   Standing balance support: Bilateral upper extremity supported Standing balance-Leahy Scale: Good Standing balance comment: Pt less steady today with standing, reliant on walker and very quick to fatigue and need to sit.  No LOBs or buckling, but with little tolerace. Cues in standing to come to full erect stand to improve breathing in standing.                           ADL either performed or assessed with clinical judgement   ADL                                               Vision Baseline Vision/History: Wears glasses Wears Glasses: At all times Patient Visual Report: No change from baseline     Perception     Praxis  Cognition Arousal/Alertness: Awake/alert Behavior During Therapy: WFL for tasks assessed/performed Overall Cognitive Status: Within Functional Limits for tasks assessed                                 General Comments: Pt is A and O        Exercises Other Exercises Other Exercises: OT facilitates education re: PLB for O2 recovery Other Exercises: OT facilitates pt particiaption in postural extension exercise to improve surface area for breathing for one set x10 reps with 4-5 second hold each time with MIN verbal/visual cues for form and pace.    Shoulder Instructions       General Comments      Pertinent Vitals/ Pain       Pain Assessment: No/denies pain  Home Living                                          Prior Functioning/Environment              Frequency  Min 1X/week        Progress Toward Goals  OT Goals(current goals can now be found in the care plan section)  Progress towards OT goals: Progressing toward goals  Acute Rehab OT Goals Patient Stated Goal: To get stronger and return home OT Goal Formulation: With patient Time For Goal Achievement: 04/03/19 Potential to Achieve Goals: Good  Plan Discharge plan remains appropriate;Frequency remains appropriate    Co-evaluation    PT/OT/SLP Co-Evaluation/Treatment: Yes Reason for Co-Treatment: Complexity of the patient's impairments (multi-system involvement);For patient/therapist safety PT goals addressed during session: Mobility/safety with mobility;Balance;Proper use of DME OT goals addressed during session: ADL's and self-care;Proper use of Adaptive equipment and DME      AM-PAC OT "6 Clicks" Daily Activity     Outcome Measure   Help from another person eating meals?: None Help from another person taking care of personal grooming?: None Help from another person toileting, which includes using toliet, bedpan, or urinal?: A Little Help from another person bathing (including washing, rinsing, drying)?: A Little Help from another person to put on and taking off regular upper body clothing?: None Help from another person to put on and taking off regular lower body clothing?: A Little 6 Click Score: 21    End of Session Equipment Utilized During Treatment: Rolling walker  OT Visit Diagnosis: Other abnormalities of gait and mobility (R26.89);Muscle weakness (generalized) (M62.81)   Activity Tolerance Patient tolerated treatment well   Patient Left in chair;with call bell/phone within reach   Nurse Communication           Time: [id number] OT Time Calculation (min): 37 min  Charges: OT General Charges $OT Visit: 1 Visit OT Treatments $Therapeutic Exercise: 8-22 mins  Gerrianne Scale, [place], OTR/L ascom [phone] 03/31/19, 4:23 PM

## 2019-03-31 NOTE — Progress Notes (Signed)
PT Cancellation Note  Patient Details Name: Rebecca Kirby MRN: [id number] DOB: Apr 25, 1945   Cancelled Treatment:    Reason Eval/Treat Not Completed: Fatigue/lethargy limiting ability to participate   Pt awake.  Stated she was up for several hours in the recliner yesterday and was generally fatigued.  Requested PM session.  Will accommodate as schedule allows.    Chesley Noon 03/31/2019, 11:22 AM

## 2019-03-31 NOTE — Consult Note (Signed)
PHARMACY CONSULT NOTE - FOLLOW UP  Pharmacy Consult for Electrolyte Monitoring and Replacement   Rebecca Kirby is a 74 y.o. female admitted on 03/19/2019 due to worsening hypoxemia post COVID-19 infection, along with acute hypoxemic respiratory failure; pt tested positive on 03/11/2019. CXRs were consistent with COVID-19 induced pneumonia. PMH includes chronic anemia, anxiety disorder, hx of colon CA, pancreatic CA, diabetes, hypothyroidism, renal cell carcinoma, and history of thyroid CA.  Patient is on Phoslo at home for hyperphosphatemia.  Pharmacy consulted to assist with monitoring and replacing electrolytes.   Recent Labs: Potassium (mmol/L)  Date Value  03/31/2019 4.3  03/05/2014 4.6   Magnesium (mg/dL)  Date Value  03/30/2019 2.2   Calcium (mg/dL)  Date Value  03/31/2019 9.0   Calcium, Total (mg/dL)  Date Value  03/05/2014 9.5   Albumin (g/dL)  Date Value  03/31/2019 2.3 (L)  02/10/2014 3.1 (L)   Phosphorus (mg/dL)  Date Value  03/31/2019 4.3   Sodium (mmol/L)  Date Value  03/31/2019 136  03/05/2014 138   Corrected Ca:  9.96 mg/dL  Assessment: 1. Electrolytes:  Goal of therapy:  all electrolytes WNL.    Potassium with significant decrease: will discontinue Lokelma 10 g BID  Phoslo increased to 1334 mg TIDWM on 1/29: improving Will obtain electrolytes with AM labs.  2. Glucose:  SBG remains elevated but improved over the previous 24 hours (58 -198).  Patient has diabetes and has been on IV steroids with stop date 1/31.  Current orders include moderate SSI TIDWM, lantus 60 units QHS changed to Levemir 32 units BID, and novolog 8 units TIDWM. Diabetes Coordinator with new recommendations.  Continue to monitor SBG daily.   3. Constipation:  Last BM 1/31  Will continue to monitor.  Paulina Fusi, PharmD, BCPS 03/31/2019 1:48 PM

## 2019-03-31 NOTE — Progress Notes (Signed)
CRITICAL CARE NOTE 74 year old female with a history of chronic anemia, anxiety disorder, history of colon CA, GERD, hypothyroidism renal cell carcinoma history of thyroid CA, pancreatic CA status post Whipple's procedure, diabetes, came into the ED due to worsening hypoxemia post COVID-19 infection. Patient has been positive for COVID-19 since March 11, 2019  Patient generally does not like to go to the hospital and has initially declined to come in however her daughter was insisting that she comes in and get medical attention. In the ED she was noted to be 85% on 2 L which improved with increased supplemental oxygen satting at approximately 90% on 5 to 6 L/min nasal cannula. Chest x-ray was reviewed-consistent with multilobar bilateral airspace opacification consistent with COVID-19 pneumonia.   Lines / Drains: PIVx2  Cultures / Sepsis markers: Respiratory culture -negative so far MRSA PCT - negative Blood culture x2 -no growth  Antimicrobials: Vecluri: completed protocol   Protocols / Consultants: PCCM Hospitalist  Tests / Events: [place] - Lower extermities - negative for DVT bilaterally   03/21/19- on NRB with some discomfort, decreased lasix due to euvolemia, CXR reviewed with improvement 03/22/19 -transitioned to HFNC for patient comfort, patient reports improved strength, no distress, O2 requirement unchanged from yesterday. Will repeat CXR tommorow. Finished Remdesevir today 1/25 daughter updated,severe hypoxia 1/26 remains hypoxic fio2 100% RR 20's 1/27 REMAINS HYPOXIC CXR B/L OPACITIES DAUGHTER [name] UPDATED DAILY 1/28 remains very hypoxic 1/29 remains hypoxic on high flow [place] fio2 at 75% 1/30 remains hypoxic on high flow [place] fio2 at 80% 2/01 persistent hypoxemia however was able to be up in chair most of the day, FiO2 down to 55% 2/02 improvement in oxygenation FiO2 down to 40%   CC  follow up respiratory failure  SUBJECTIVE Patient remains in stepdown due  to requirements with high flow O2 Prognosis is guarded Severe ARDS/delayed pulmonary phase of Covid   BP (!) 134/46   Pulse 96   Temp 98.3 F (36.8 C) (Axillary)   Resp 19   Ht 5\' 5"  (1.651 m)   Wt 98.3 kg   SpO2 95%   BMI 36.06 kg/m    I/O last 3 completed shifts: In: 65 [P.O.:720] Out: 1000 [Urine:1000] Total I/O In: 480 [P.O.:480] Out: -   SpO2: 95 % O2 Flow Rate (L/min): 8 L/min FiO2 (%): 40 %   COVID-19 DISASTER DECLARATION:   FULL CONTACT PHYSICAL EXAMINATION WAS NOT POSSIBLE DUE TO TREATMENT OF COVID-19 AND   CONSERVATION OF PERSONAL PROTECTIVE EQUIPMENT, LIMITED EXAM FINDINGS INCLUDE-   Patient assessed or the symptoms described in the history of present illness.   In the context of the Global COVID-19 pandemic, which necessitated consideration that the patient might be at risk for infection with the SARS-CoV-2 virus that causes COVID-19, Institutional protocols and algorithms that pertain to the evaluation of patients at risk for COVID-19 are in a state of rapid change based on information released by regulatory bodies including the CDC and federal and state organizations. These policies and algorithms were followed during the patient's care while in hospital.     MEDICATIONS: I have reviewed all medications and confirmed regimen as documented   CULTURE RESULTS   No results found for this or any previous visit (from the past 240 hour(s)).    IMAGING    No results found.     Indwelling Urinary Catheter continued, requirement due to   Reason to continue Indwelling Urinary Catheter strict Intake/Output monitoring for hemodynamic instability   Central Line/ PIV's only  Reason to continue Central Line    Ventilator  N/A   Ventilator Sedation  N/A      ASSESSMENT AND PLAN:  SYNOPSIS SEVERE ARDS and ACUTE HYPOXIC RESP FAILURE COVID 19 INFECTION AND PNEUMONIA HIGH RISK FOR INTUBATION Fio2 55 %  Severe COVID-19 infection, ARDS and  pneumonia/pneumonitis/delayed pulmonary phase Completed IV steroids S/p IV remdisivir Aggressive pulm toilet continued OOB to chair as tolerated  Pulmonary hygiene  Maintain airborne and contact precautions  Ciclesonide and Combivent (MDI) Vitamin C and zinc Antitussives Transition to bubble high flow as tolerated   ACUTE KIDNEY INJURY/Renal Failure follow chem 7 follow UO continue Foley Catheter-assess need Avoid nephrotoxic agents Recheck creatinine  Lasix as tolerated   CARDIAC ICU/SDU monitoring  GI GI PROPHYLAXIS as indicated  NUTRITIONAL STATUS DIET--> as tolerated Constipation protocol as indicated  ENDO - will use ICU hypoglycemic\Hyperglycemia protocol if indicated   ELECTROLYTES -follow labs as needed -replace as needed -pharmacy consultation and following   DVT/GI PRX ordered TRANSFUSIONS AS NEEDED MONITOR FSBS ASSESS the need for LABS as needed   Discussed during multidisciplinary rounds.  Overall, patient is critically ill, prognosis is guarded however, less dire.  Persistent hypoxemia in the setting of COVID-19, overall improving, transitioning to bubble high flow today.  Hopefully can transfer to general medical ward in the next day or so.    Renold Don, MD [facility]   *This note was dictated using voice recognition software/Dragon.  Despite best efforts to proofread, errors can occur which can change the meaning.  Any change was purely unintentional.

## 2019-03-31 NOTE — Progress Notes (Signed)
Physical Therapy Treatment Patient Details Name: Rebecca Kirby MRN: [id number] DOB: 1945-07-28 Today's Date: 03/31/2019    History of Present Illness 74 year old female with a history of chronic anemia, anxiety disorder, history of colon CA, GERD, hypothyroidism renal cell carcinoma history of thyroid CA, pancreatic CA status post Whipple's procedure, diabetes, B TKA, hernia came into the ED due to worsening hypoxemia post COVID-19 infection.   MD assessment includes acute hypoxemic respiratory failure due to COVID19 induced bilateral multifocal pneumonia.    PT Comments    Pt just transferred from bed to recliner prior to PT/OT arriving. Co-treat performed 2/2 to pt's fatigue and inability to tolerate individual sessions. Pt was on 8 L o2 upon arriving but had to be increased to 10 L during session. Once activity performed and pt resting, was able to return to 8L. She did not require non re-breather this date. Pt stood 3 x from recliner with use of RW and + 2 assist for safety. +2 not required to physical achieve standing. Mod A of +1. First standing trial, pt stood ~1-2 minutes with desat to 75 % however with 3 min seated rest recovers to 90s without increase in O2. On 2nd/3rd trial of standing pt only able to tolerate standing ~ 15 sec prior to requiring seated rest. O2 level was maintained in mid to upper 80s with 2nd and 3rd trial. Pt very fatigued after 3 bouts of standing. She was repositioned up in recliner with call bell in reach and BLEs elevated. Pt demonstrated improved ability's to tolerate session this date and is progressing with therapy. PT will continue to follow per POC.     Follow Up Recommendations  Home health PT;Supervision for mobility/OOB     Equipment Recommendations  None recommended by PT    Recommendations for Other Services       Precautions / Restrictions Precautions Precautions: Fall Precaution Comments: watch O2 sats(pt on 8 L high flow upon arriving but  increased to 10 L) Restrictions Weight Bearing Restrictions: No    Mobility  Bed Mobility               General bed mobility comments: pt already sitting in recliner upion arriving. Just was able to get OOB after lunch.  Transfers Overall transfer level: Needs assistance Equipment used: Rolling walker (2 wheeled) Transfers: Sit to/from Stand Sit to Stand: Mod assist         General transfer comment: Pt was able to perform and tolerate standing from recliner 3 x. First trial able to stand ~1-2 minutes prior to desat and needing seated rest. Desat 76 % but with seated rest recovers to 90s without use of rebreather. on 10 L nasal cannula throughout session. 2nd and 3rd trial of standing pt only able to stand 15 sec 2/2 fatigue and needing seated rest. she was able to control breathing and recover to 90s with ~ 3 minute seated rest between trials.  Ambulation/Gait             General Gait Details: did not perform ambulation this date   Stairs             Wheelchair Mobility    Modified Rankin (Stroke Patients Only)       Balance Overall balance assessment: Needs assistance Sitting-balance support: No upper extremity supported;Feet supported Sitting balance-Leahy Scale: Good Sitting balance - Comments: pt sat in recliner with feet on floor and without UE support x several minutes during session while recovering from standing without  LOB.   Standing balance support: Bilateral upper extremity supported Standing balance-Leahy Scale: Good Standing balance comment: Pt less steady today with standing, reliant on walker and very quick to fatigue and need to sit.  No LOBs or buckling, but with little tolerace                            Cognition Arousal/Alertness: Awake/alert Behavior During Therapy: WFL for tasks assessed/performed Overall Cognitive Status: Within Functional Limits for tasks assessed                                  General Comments: Pt is A and O      Exercises      General Comments        Pertinent Vitals/Pain Pain Assessment: No/denies pain    Home Living                      Prior Function            PT Goals (current goals can now be found in the care plan section) Acute Rehab PT Goals Patient Stated Goal: To get stronger and return home Progress towards PT goals: Progressing toward goals    Frequency    Min 2X/week      PT Plan Current plan remains appropriate    Co-evaluation PT/OT/SLP Co-Evaluation/Treatment: Yes Reason for Co-Treatment: For patient/therapist safety;Complexity of the patient's impairments (multi-system involvement) PT goals addressed during session: Mobility/safety with mobility;Strengthening/ROM        AM-PAC PT "6 Clicks" Mobility   Outcome Measure  Help needed turning from your back to your side while in a flat bed without using bedrails?: A Little Help needed moving from lying on your back to sitting on the side of a flat bed without using bedrails?: A Little Help needed moving to and from a bed to a chair (including a wheelchair)?: A Little Help needed standing up from a chair using your arms (e.g., wheelchair or bedside chair)?: A Little Help needed to walk in hospital room?: A Little Help needed climbing 3-5 steps with a railing? : A Little 6 Click Score: 18    End of Session Equipment Utilized During Treatment: Oxygen Activity Tolerance: Patient limited by fatigue Patient left: in chair;with chair alarm set;with call bell/phone within reach Nurse Communication: Mobility status;Other (comment)(discussed with RT) PT Visit Diagnosis: Difficulty in walking, not elsewhere classified (R26.2);Muscle weakness (generalized) (M62.81)     Time: [id number] PT Time Calculation (min) (ACUTE ONLY): 35 min  Charges:  $Therapeutic Activity: 8-22 mins                     Julaine Fusi PTA 03/31/19, 4:08 PM

## 2019-03-31 NOTE — Progress Notes (Signed)
Pt placed on an 8L HFNC with a NRB at bedside on standby.

## 2019-03-31 NOTE — Progress Notes (Signed)
FAMILY MEETING WITH [name] (DAUGHTER ) and PATIENT VIA FACE-TIME   ALL QUESTIONS ANSWERED I DISCUSSED PLAN FOR TRANSITION TO WALL FLOW OXYGEN AND TRANSFER TO GEN MED FLOOR AS TOLERATED.  NO FURTHER RECS AT THIS TIME  Corrin Parker, M.D.  [facility]  Medical Director [place] Director [facility] Cardio-Pulmonary Department

## 2019-03-31 NOTE — Progress Notes (Addendum)
Inpatient Diabetes Program Recommendations  AACE/ADA: New Consensus Statement on Inpatient Glycemic Control (2015)  Target Ranges:  Prepandial:   less than 140 mg/dL      Peak postprandial:   less than 180 mg/dL (1-2 hours)      Critically ill patients:  140 - 180 mg/dL   Lab Results  Component Value Date   GLUCAP 58 (L) 03/31/2019   HGBA1C 7.4 (H) 03/19/2019    Review of Glycemic Control Results for Rebecca Kirby, Rebecca Kirby (MRN [id number]) as of 03/31/2019 09:48  Ref. Range 03/30/2019 08:07 03/30/2019 11:19 03/30/2019 16:32 03/30/2019 21:52 03/31/2019 07:46  Glucose-Capillary Latest Ref Range: 70 - 99 mg/dL 221 (H) 198 (H) 134 (H) 154 (H) 58 (L)    Diabetes history: pancreatic CA status post Whipple's procedure, DM 2 Outpatient Diabetes medications: Tresiba 90 units qhs, Metformin 500 mg bid, Humalog 18-34 units tid Current orders for Inpatient glycemic control:  Lantus 32 units bid Novolog 0-15 units tid + hs Novolog 8 units tid meal coverage  BUN/Creat: 70/1.18 Nepro tid between meals No longer on steroids last dose   Inpatient Diabetes Program Recommendations:    -Consider Holding Levemir until this evening (spoke with Jonelle Sidle, RN this am)  - Decrease Levemir dose to 20 units qpm and will reassess glucose trends in the morning   - Decrease Novolog Correction to Sensitive 0-9 units.  - Decrease Novolog meal coverage to 4 units tid.  Pt is on lower doses currently than what she takes at home.  Thanks,  Tama Headings RN, MSN, BC-ADM Inpatient Diabetes Coordinator Team Pager [phone] (8a-5p)

## 2019-04-01 ENCOUNTER — Ambulatory Visit: Payer: Medicare Other

## 2019-04-01 ENCOUNTER — Inpatient Hospital Stay: Payer: Medicare Other

## 2019-04-01 ENCOUNTER — Other Ambulatory Visit: Payer: Medicare Other

## 2019-04-01 DIAGNOSIS — U071 COVID-19: Secondary | ICD-10-CM

## 2019-04-01 LAB — CBC WITH DIFFERENTIAL/PLATELET
Abs Immature Granulocytes: 0.1 10*3/uL — ABNORMAL HIGH (ref 0.00–0.07)
Basophils Absolute: 0 10*3/uL (ref 0.0–0.1)
Basophils Relative: 0 %
Eosinophils Absolute: 0.1 10*3/uL (ref 0.0–0.5)
Eosinophils Relative: 1 %
HCT: 28.8 % — ABNORMAL LOW (ref 36.0–46.0)
Hemoglobin: 9.2 g/dL — ABNORMAL LOW (ref 12.0–15.0)
Immature Granulocytes: 1 %
Lymphocytes Relative: 5 %
Lymphs Abs: 0.7 10*3/uL (ref 0.7–4.0)
MCH: 26.5 pg (ref 26.0–34.0)
MCHC: 31.9 g/dL (ref 30.0–36.0)
MCV: 83 fL (ref 80.0–100.0)
Monocytes Absolute: 0.5 10*3/uL (ref 0.1–1.0)
Monocytes Relative: 3 %
Neutro Abs: 13.9 10*3/uL — ABNORMAL HIGH (ref 1.7–7.7)
Neutrophils Relative %: 90 %
Platelets: 136 10*3/uL — ABNORMAL LOW (ref 150–400)
RBC: 3.47 MIL/uL — ABNORMAL LOW (ref 3.87–5.11)
RDW: 14.1 % (ref 11.5–15.5)
WBC: 15.2 10*3/uL — ABNORMAL HIGH (ref 4.0–10.5)
nRBC: 0 % (ref 0.0–0.2)

## 2019-04-01 LAB — GLUCOSE, CAPILLARY
Glucose-Capillary: 211 mg/dL — ABNORMAL HIGH (ref 70–99)
Glucose-Capillary: 260 mg/dL — ABNORMAL HIGH (ref 70–99)
Glucose-Capillary: 294 mg/dL — ABNORMAL HIGH (ref 70–99)
Glucose-Capillary: 321 mg/dL — ABNORMAL HIGH (ref 70–99)
Glucose-Capillary: 347 mg/dL — ABNORMAL HIGH (ref 70–99)

## 2019-04-01 LAB — BASIC METABOLIC PANEL
Anion gap: 7 (ref 5–15)
BUN: 58 mg/dL — ABNORMAL HIGH (ref 8–23)
CO2: 29 mmol/L (ref 22–32)
Calcium: 8.7 mg/dL — ABNORMAL LOW (ref 8.9–10.3)
Chloride: 96 mmol/L — ABNORMAL LOW (ref 98–111)
Creatinine, Ser: 1.31 mg/dL — ABNORMAL HIGH (ref 0.44–1.00)
GFR calc Af Amer: 46 mL/min — ABNORMAL LOW (ref >60)
GFR calc non Af Amer: 40 mL/min — ABNORMAL LOW (ref >60)
Glucose, Bld: 190 mg/dL — ABNORMAL HIGH (ref 70–99)
Potassium: 4.6 mmol/L (ref 3.5–5.1)
Sodium: 132 mmol/L — ABNORMAL LOW (ref 135–145)

## 2019-04-01 LAB — URINALYSIS, COMPLETE (UACMP) WITH MICROSCOPIC
Bacteria, UA: NONE SEEN
Bilirubin Urine: NEGATIVE
Glucose, UA: NEGATIVE mg/dL
Hgb urine dipstick: NEGATIVE
Ketones, ur: NEGATIVE mg/dL
Nitrite: NEGATIVE
Protein, ur: NEGATIVE mg/dL
Specific Gravity, Urine: 1.018 (ref 1.005–1.030)
pH: 5 (ref 5.0–8.0)

## 2019-04-01 LAB — PHOSPHORUS: Phosphorus: 5.2 mg/dL — ABNORMAL HIGH (ref 2.5–4.6)

## 2019-04-01 LAB — MAGNESIUM: Magnesium: 1.9 mg/dL (ref 1.7–2.4)

## 2019-04-01 MED ORDER — INSULIN ASPART 100 UNIT/ML ~~LOC~~ SOLN
0.0000 [IU] | Freq: Three times a day (TID) | SUBCUTANEOUS | Status: DC
  Administered 2019-04-01: 08:00:00 7 [IU] via SUBCUTANEOUS
  Administered 2019-04-01: 17:00:00 5 [IU] via SUBCUTANEOUS
  Administered 2019-04-02 – 2019-04-03 (×3): 1 [IU] via SUBCUTANEOUS
  Administered 2019-04-03: 17:00:00 2 [IU] via SUBCUTANEOUS
  Administered 2019-04-04: 12:00:00 3 [IU] via SUBCUTANEOUS
  Administered 2019-04-04: 08:00:00 2 [IU] via SUBCUTANEOUS
  Administered 2019-04-04: 17:00:00 5 [IU] via SUBCUTANEOUS
  Administered 2019-04-05 (×2): 2 [IU] via SUBCUTANEOUS
  Administered 2019-04-05: 3 [IU] via SUBCUTANEOUS
  Administered 2019-04-06: 18:00:00 5 [IU] via SUBCUTANEOUS
  Administered 2019-04-06: 12:00:00 3 [IU] via SUBCUTANEOUS
  Administered 2019-04-06: 2 [IU] via SUBCUTANEOUS
  Administered 2019-04-07: 12:00:00 3 [IU] via SUBCUTANEOUS
  Filled 2019-04-01 (×16): qty 1

## 2019-04-01 MED ORDER — INSULIN ASPART 100 UNIT/ML ~~LOC~~ SOLN
4.0000 [IU] | Freq: Three times a day (TID) | SUBCUTANEOUS | Status: DC
  Administered 2019-04-01 – 2019-04-06 (×16): 4 [IU] via SUBCUTANEOUS
  Filled 2019-04-01 (×14): qty 1

## 2019-04-01 MED ORDER — INSULIN DETEMIR 100 UNIT/ML ~~LOC~~ SOLN
20.0000 [IU] | Freq: Two times a day (BID) | SUBCUTANEOUS | Status: DC
  Administered 2019-04-01 – 2019-04-02 (×4): 20 [IU] via SUBCUTANEOUS
  Filled 2019-04-01 (×7): qty 0.2

## 2019-04-01 NOTE — Consult Note (Signed)
PHARMACY CONSULT NOTE - FOLLOW UP  Pharmacy Consult for Electrolyte Monitoring and Replacement   Rebecca Kirby. Rebecca Kirby is a 74 y.o. female admitted on 03/19/2019 due to worsening hypoxemia post COVID-19 infection, along with acute hypoxemic respiratory failure; pt tested positive on 03/11/2019. CXRs were consistent with COVID-19 induced pneumonia. PMH includes chronic anemia, anxiety disorder, hx of colon CA, pancreatic CA, diabetes, hypothyroidism, renal cell carcinoma, and history of thyroid CA.  Patient is on Phoslo at home for hyperphosphatemia.  Pharmacy consulted to assist with monitoring and replacing electrolytes.   Recent Labs: Potassium (mmol/L)  Date Value  04/01/2019 4.6  03/05/2014 4.6   Magnesium (mg/dL)  Date Value  04/01/2019 1.9   Calcium (mg/dL)  Date Value  04/01/2019 8.7 (L)   Calcium, Total (mg/dL)  Date Value  03/05/2014 9.5   Albumin (g/dL)  Date Value  03/31/2019 2.3 (L)  02/10/2014 3.1 (L)   Phosphorus (mg/dL)  Date Value  04/01/2019 5.2 (H)   Sodium (mmol/L)  Date Value  04/01/2019 132 (L)  03/05/2014 138   Corrected Ca:  10.06 mg/dL  Assessment: 1. Electrolytes:  Goal of therapy: all electrolytes WNL.    Potassium remains wnl off of Lokelma  Sodium noted to be borderline low: continue to monitor  Phoslo increased to 1334 mg TIDWM on 1/29: improving Will obtain electrolytes with AM labs.  2. Glucose:    Patient has diabetes and has been on IV steroids stopped  1/31.  Current orders include sensitive SSI TIDWM, Levemir 20 units BID, and novolog 4 units TIDWM.  Continue to monitor SBG daily.   3. Constipation:  Last BM 2/2 Will continue to monitor.  Vallery Sa, PharmD, BCPS 04/01/2019 7:48 AM

## 2019-04-01 NOTE — Progress Notes (Signed)
CRITICAL CARE NOTE 74 year old female with a history of chronic anemia, anxiety disorder, history of colon CA, GERD, hypothyroidism renal cell carcinoma history of thyroid CA, pancreatic CA status post Whipple's procedure, diabetes, came into the ED due to worsening hypoxemia post COVID-19 infection. Patient has been positive for COVID-19 since March 11, 2019.In the ED she was noted to be 85% on 2 L which improved with increased supplemental oxygen bringing oxygen saturations to 90% on 5 to 6 L/min nasal cannula. Chest x-ray was reviewed-consistent with multilobar bilateral airspace opacification consistent with COVID-19 pneumonia.   Lines / Drains: PIVx2  Cultures / Sepsis markers: Respiratory culture -negative  MRSA PCT - negative Blood culture x2 -no growth Urine cult - pending  Antimicrobials: Vecluri (Remdesivir): completed protocol   Protocols / Consultants: PCCM Hospitalist  Tests / Events: [place] - Lower extermities - negative for DVT bilaterally   03/21/19- on NRB with some discomfort, decreased lasix due to euvolemia, CXR reviewed with improvement 03/22/19 -transitioned to HFNC for patient comfort, patient reports improved strength, no distress, O2 requirement unchanged from yesterday. Will repeat CXR tommorow. Finished Remdesevir today 1/25 daughter Illene Regulus hypoxia 1/26 remains hypoxic fio2 100% RR 20's 1/27 REMAINS HYPOXIC CXR B/L OPACITIES DAUGHTER [name] UPDATED DAILY 1/28 remains very hypoxic 1/29 remains hypoxic on high flow [place] fio2 at 75% 1/30 remains hypoxic on high flow [place] fio2 at 80% 2/01 persistent hypoxemia however was able to be up in chair most of the day, FiO2 down to 55% 2/02 improvement in oxygenation FiO2 down to 40% 2/03 Per ID COVID-19 isolation D/C'd   CC  follow up respiratory failure  SUBJECTIVE On bubble high flow at 6 L/min equivalent to approximately 40% FiO2 O2 sats 95% Severe ARDS/delayed pulmonary phase of  COVID-19  Does not endorse any pain.  Dyspnea markedly improved.  She is happy she is "graduating" out of isolation.  Concern about getting physical therapy once she returns home assured her this is possible.  BP (!) 117/39   Pulse 80   Temp (!) 97.5 F (36.4 C) (Axillary)   Resp (!) 24   Ht 5\' 5"  (1.651 m)   Wt 98.3 kg   SpO2 95%   BMI 36.06 kg/m    I/O last 3 completed shifts: In: 720 [P.O.:720] Out: 900 [Urine:900] No intake/output data recorded.  SpO2: 95 % O2 Flow Rate (L/min): 6 L/min FiO2 (%): 40 %  Full PPE not necessary, expanded exam possible   GENERAL: Obese, debilitated appearing, comfortable on bubble high flow nasal cannula no respiratory distress. HEAD: Normocephalic, atraumatic.  EYES: Pupils equal, round, reactive to light.  No scleral icterus.  MOUTH: Oral mucosa moist, no thrush NECK: Supple.  PULMONARY: Without adventitious sounds, good air entry bilaterally. CARDIOVASCULAR: S1 and S2. Regular rate and rhythm.  Monitor SR. GASTROINTESTINAL: Obese abdomen, nondistended MUSCULOSKELETAL: No joint swelling, no clubbing, trace pedal edema  NEUROLOGIC: No overt focal deficit, awake and alert. SKIN: Intact,warm,dry PSYCH: Appropriate mood and behavior    MEDICATIONS: I have reviewed all medications and confirmed regimen as documented   CULTURE RESULTS   No results found for this or any previous visit (from the past 240 hour(s)).    IMAGING    DG Chest Port 1 View  Result Date: 04/01/2019 CLINICAL DATA:  Respiratory failure. EXAM: PORTABLE CHEST 1 VIEW COMPARISON:  03/25/2019 FINDINGS: Mild cardiac enlargement, unchanged. There are diffuse interstitial and airspace densities throughout both lungs. Unchanged compared with previous exam. No pleural effusion identified. No pneumothorax. IMPRESSION: No  change in bilateral interstitial and airspace opacities compatible with multifocal infection. Electronically Signed   By: Kerby Moors M.D.   On:  04/01/2019 08:59   Chest x-ray independently reviewed, as noted above, by comparison appears slightly improved particularly on the right, but persistent bilateral interstitial airspace opacities.    Indwelling Urinary Catheter  pure wick   Reason to continue Indwelling Urinary Catheter   Central Line/ PIV's only   Reason to continue Central Line    Ventilator  N/A   Ventilator Sedation  N/A      ASSESSMENT AND PLAN:  SYNOPSIS SEVERE ARDS and ACUTE HYPOXIC RESP FAILURE COVID 19 INFECTION AND PNEUMONIA and delayed pulmonary phase of COVID-19.  Severe COVID-19 infection, ARDS and pneumonia/pneumonitis/delayed pulmonary phase Completed IV steroids S/P IV remdisivir Aggressive pulm toilet continued OOB to chair /PT OT as tolerated Pulmonary hygiene  Isolation discontinued Ciclesonide and Combivent (MDI) continue Continue multivitamins Antitussives as needed Tolerating transition to bubble high flow, titrate O2 as tolerates for saturations of 90% or better   ACUTE KIDNEY INJURY/Renal Failure follow chem 7 follow UO continue pure wick Avoid nephrotoxic agents BUN 58 creatinine 1.31 improving slowly Would continue to withhold Lasix Encourage p.o. fluids  Complex hematologic/oncologic history Chronic pancytopenia Metastatic renal cell Leukocytosis noted has issues with chronic leukocytosis No fever, monitor   CARDIAC  no further need for telemetry monitoring  GI History of cirrhosis No decompensation evident  NUTRITIONAL STATUS DIET--> as tolerated Constipation protocol as indicated Albumin 2.3 on 2/1  ENDO Hypothyroidism, continue levothyroxine  ID Isolation for COVID-19 discontinued 21 days post infection diagnosis Improving clinical picture Leukocytosis, asymptomatic, afebrile Has history of chronic leukocytosis Check UA/urine culture for completeness has had issues with Enterobacter in the past    Discussed during multidisciplinary rounds.   Patient to transition to [place] floor, and [employer] affairs officer.  Dr. Claudette Stapler will follow from pulmonary standpoint.  Overall patient is improving, continue supportive therapies.    Renold Don, MD [facility]   *This note was dictated using voice recognition software/Dragon.  Despite best efforts to proofread, errors can occur which can change the meaning.  Any change was purely unintentional.

## 2019-04-01 NOTE — Progress Notes (Signed)
Physical Therapy Treatment Patient Details Name: Rebecca Kirby MRN: [id number] DOB: Mar 26, 1945 Today's Date: 04/01/2019    History of Present Illness 74 year old female with a history of chronic anemia, anxiety disorder, history of colon CA, GERD, hypothyroidism renal cell carcinoma history of thyroid CA, pancreatic CA status post Whipple's procedure, diabetes, B TKA, hernia came into the ED due to worsening hypoxemia post COVID-19 infection.   MD assessment includes acute hypoxemic respiratory failure due to COVID19 induced bilateral multifocal pneumonia.    PT Comments    Patient received in bed, daughter present. Initially wanting to wait until pm to do PT, daughter helped encourage patient to participate. No pain reported, reports she is feeling better. Once session began, RN came to room states patient is moving to new room. Patient performed bed exercises, performed supine to sit with mod independence, requires min guard for sit to stand with rolling walker. Ambulated 5 feet from CCU bed to hospital bed using rw, min guard. Requires min assist to get legs back into bed. Patient will continue to benefit from skilled PT to improve strength and functional independence.       Follow Up Recommendations  Home health PT;Supervision for mobility/OOB     Equipment Recommendations  None recommended by PT    Recommendations for Other Services       Precautions / Restrictions Precautions Precautions: Fall Precaution Comments: watch O2 sats Restrictions Weight Bearing Restrictions: No Other Position/Activity Restrictions: Per MD notes, OK to participate with PT/OT with SpO2 >80%.    Mobility  Bed Mobility Overal bed mobility: Modified Independent             General bed mobility comments: use of rails  Transfers Overall transfer level: Needs assistance Equipment used: Rolling walker (2 wheeled) Transfers: Sit to/from Stand Sit to Stand: Min guard             Ambulation/Gait Ambulation/Gait assistance: Min guard Gait Distance (Feet): 5 Feet Assistive device: Rolling walker (2 wheeled) Gait Pattern/deviations: Step-through pattern;Decreased step length - right;Decreased step length - left Gait velocity: decreased   General Gait Details: ambulated from CCU bed to regular hospital bed for room transfer   Stairs             Wheelchair Mobility    Modified Rankin (Stroke Patients Only)       Balance Overall balance assessment: Needs assistance Sitting-balance support: Feet supported Sitting balance-Leahy Scale: Good     Standing balance support: Bilateral upper extremity supported;During functional activity Standing balance-Leahy Scale: Good Standing balance comment: Patient demonstrates good ability to ambulate short distance, however limited by O2 desaturation with activity. Sats dropped to low 80%s with PT.                            Cognition Arousal/Alertness: Awake/alert Behavior During Therapy: WFL for tasks assessed/performed Overall Cognitive Status: Within Functional Limits for tasks assessed                                 General Comments: Pt is A and O, very pleasant.      Exercises Other Exercises Other Exercises: B LE strengthening: AP, hip abd/add x 10 reps each    General Comments        Pertinent Vitals/Pain Pain Assessment: No/denies pain    Home Living  Prior Function            PT Goals (current goals can now be found in the care plan section) Acute Rehab PT Goals Patient Stated Goal: To get stronger and return home PT Goal Formulation: With patient/family Time For Goal Achievement: 04/08/19 Potential to Achieve Goals: Good Progress towards PT goals: Progressing toward goals    Frequency    Min 2X/week      PT Plan Current plan remains appropriate    Co-evaluation              AM-PAC PT "6 Clicks" Mobility    Outcome Measure  Help needed turning from your back to your side while in a flat bed without using bedrails?: A Little Help needed moving from lying on your back to sitting on the side of a flat bed without using bedrails?: A Little Help needed moving to and from a bed to a chair (including a wheelchair)?: A Little Help needed standing up from a chair using your arms (e.g., wheelchair or bedside chair)?: A Little Help needed to walk in hospital room?: A Little Help needed climbing 3-5 steps with a railing? : A Lot 6 Click Score: 17    End of Session Equipment Utilized During Treatment: Oxygen Activity Tolerance: Patient limited by fatigue;Other (comment)(limited by O2 desaturation) Patient left: in bed;with nursing/sitter in room Nurse Communication: Mobility status PT Visit Diagnosis: Difficulty in walking, not elsewhere classified (R26.2);Muscle weakness (generalized) (M62.81)     Time: 1100-1130 PT Time Calculation (min) (ACUTE ONLY): 30 min  Charges:  $Gait Training: 8-22 mins $Therapeutic Exercise: 8-22 mins                     [employer], PT, [employer] 04/01/19,12:03 PM

## 2019-04-02 DIAGNOSIS — C649 Malignant neoplasm of unspecified kidney, except renal pelvis: Secondary | ICD-10-CM

## 2019-04-02 DIAGNOSIS — R531 Weakness: Secondary | ICD-10-CM

## 2019-04-02 DIAGNOSIS — E039 Hypothyroidism, unspecified: Secondary | ICD-10-CM

## 2019-04-02 DIAGNOSIS — N189 Chronic kidney disease, unspecified: Secondary | ICD-10-CM

## 2019-04-02 DIAGNOSIS — N179 Acute kidney failure, unspecified: Secondary | ICD-10-CM

## 2019-04-02 DIAGNOSIS — E119 Type 2 diabetes mellitus without complications: Secondary | ICD-10-CM

## 2019-04-02 DIAGNOSIS — Z794 Long term (current) use of insulin: Secondary | ICD-10-CM

## 2019-04-02 LAB — RENAL FUNCTION PANEL
Albumin: 2.1 g/dL — ABNORMAL LOW (ref 3.5–5.0)
Anion gap: 7 (ref 5–15)
BUN: 43 mg/dL — ABNORMAL HIGH (ref 8–23)
CO2: 28 mmol/L (ref 22–32)
Calcium: 8.6 mg/dL — ABNORMAL LOW (ref 8.9–10.3)
Chloride: 97 mmol/L — ABNORMAL LOW (ref 98–111)
Creatinine, Ser: 1.15 mg/dL — ABNORMAL HIGH (ref 0.44–1.00)
GFR calc Af Amer: 54 mL/min — ABNORMAL LOW (ref >60)
GFR calc non Af Amer: 47 mL/min — ABNORMAL LOW (ref >60)
Glucose, Bld: 160 mg/dL — ABNORMAL HIGH (ref 70–99)
Phosphorus: 3.5 mg/dL (ref 2.5–4.6)
Potassium: 4.1 mmol/L (ref 3.5–5.1)
Sodium: 132 mmol/L — ABNORMAL LOW (ref 135–145)

## 2019-04-02 LAB — GLUCOSE, CAPILLARY
Glucose-Capillary: 106 mg/dL — ABNORMAL HIGH (ref 70–99)
Glucose-Capillary: 149 mg/dL — ABNORMAL HIGH (ref 70–99)
Glucose-Capillary: 130 mg/dL — ABNORMAL HIGH (ref 70–99)
Glucose-Capillary: 189 mg/dL — ABNORMAL HIGH (ref 70–99)

## 2019-04-02 LAB — MAGNESIUM: Magnesium: 1.8 mg/dL (ref 1.7–2.4)

## 2019-04-02 MED ORDER — FUROSEMIDE 10 MG/ML IJ SOLN
20.0000 mg | Freq: Every day | INTRAMUSCULAR | Status: DC
  Administered 2019-04-02 – 2019-04-05 (×4): 20 mg via INTRAVENOUS
  Filled 2019-04-02 (×4): qty 4

## 2019-04-02 MED ORDER — ALBUMIN HUMAN 25 % IV SOLN
12.5000 g | Freq: Every day | INTRAVENOUS | Status: DC
  Administered 2019-04-02 – 2019-04-05 (×4): 12.5 g via INTRAVENOUS
  Filled 2019-04-02 (×5): qty 50

## 2019-04-02 NOTE — Progress Notes (Addendum)
Patient ID: Rebecca Kirby, female   DOB: 12-17-1945, 74 y.o.   MRN: [id number] [facility] PROGRESS NOTE  Rebecca Kirby [id number] DOB: [birth year] DOA: 03/19/2019 PCP: Idelle Crouch, MD  HPI/Subjective: Patient does not really complain of any shortness of breath or cough.  Is a little fatigued.  Objective: Vitals:   04/01/19 2354 04/02/19 0830  BP: (!) 106/47 (!) 122/50  Pulse: 79 91  Resp: 20 20  Temp: 97.8 F (36.6 C) 98.4 F (36.9 C)  SpO2: 92%     Intake/Output Summary (Last 24 hours) at 04/02/2019 1108 Last data filed at 04/01/2019 2241 Gross per 24 hour  Intake --  Output 450 ml  Net -450 ml   Filed Weights   03/19/19 1700  Weight: 98.3 kg    ROS: Review of Systems  Constitutional: Positive for malaise/fatigue. Negative for chills and fever.  Eyes: Negative for blurred vision.  Respiratory: Negative for cough and shortness of breath.   Cardiovascular: Negative for chest pain.  Gastrointestinal: Negative for abdominal pain, constipation, diarrhea, nausea and vomiting.  Genitourinary: Negative for dysuria.  Musculoskeletal: Negative for joint pain.  Neurological: Negative for dizziness and headaches.   Exam: Physical Exam  Constitutional: She is oriented to person, place, and time.  HENT:  Nose: No mucosal edema.  Mouth/Throat: No oropharyngeal exudate or posterior oropharyngeal edema.  Eyes: Pupils are equal, round, and reactive to light. Conjunctivae and lids are normal.  Neck: Carotid bruit is not present.  Cardiovascular: S1 normal and S2 normal. Exam reveals no gallop.  No murmur heard. Respiratory: No respiratory distress. She has decreased breath sounds in the right lower field and the left lower field. She has no wheezes. She has no rhonchi. She has rales in the right lower field and the left lower field.  GI: Soft. Bowel sounds are normal. There is no abdominal tenderness.  Musculoskeletal:     Right ankle: No swelling.     Left ankle:  No swelling.  Lymphadenopathy:    She has no cervical adenopathy.  Neurological: She is alert and oriented to person, place, and time. No cranial nerve deficit.  Skin: Skin is warm. No rash noted. Nails show no clubbing.  Psychiatric: She has a normal mood and affect.      Data Reviewed: Basic Metabolic Panel: Recent Labs  Lab 03/27/19 0452 03/27/19 [id number] 03/28/19 [id number] 03/28/19 [id number] 03/29/19 0411 03/30/19 0518 03/31/19 0225 04/01/19 0554 04/02/19 0315  NA 133*   < > 136   < > 133* 135 136 132* 132*  K 5.2*   < > 4.9   < > 4.7 5.1 4.3 4.6 4.1  CL 99   < > 99   < > 98 98 99 96* 97*  CO2 22   < > 25   < > 24 30 27 29 28   GLUCOSE 296*   < > 237*   < > 235* 226* 66* 190* 160*  BUN 70*   < > 81*   < > 74* 68* 70* 58* 43*  CREATININE 1.34*   < > 1.39*   < > 1.12* 1.19* 1.18* 1.31* 1.15*  CALCIUM 8.3*   < > 8.6*   < > 8.4* 8.6* 9.0 8.7* 8.6*  MG 2.2  --  2.3  --   --  2.2  --  1.9 1.8  PHOS 6.6*   < > 6.5*   < > 5.3* 5.2*  5.6* 4.3 5.2* 3.5   < > = values  in this interval not displayed.   Liver Function Tests: Recent Labs  Lab 03/30/19 0518 03/31/19 0225 04/02/19 0315  ALBUMIN 2.3* 2.3* 2.1*   CBC: Recent Labs  Lab 03/28/19 0447 03/29/19 0411 03/30/19 0518 04/01/19 0554  WBC 14.0* 15.7* 13.5* 15.2*  NEUTROABS 13.0* 14.7*  --  13.9*  HGB 9.5* 9.3* 9.3* 9.2*  HCT 29.5* 28.5* 28.8* 28.8*  MCV 82.4 82.8 83.2 83.0  PLT 234 221 200 136*   BNP (last 3 results) Recent Labs    03/19/19 1218  BNP 139.0*    CBG: Recent Labs  Lab 04/01/19 1706 04/01/19 2122 04/01/19 2205 04/01/19 2338 04/02/19 0827  GLUCAP 260* 347* 294* 211* 106*     Studies: DG Chest Port 1 View  Result Date: 04/01/2019 CLINICAL DATA:  Respiratory failure. EXAM: PORTABLE CHEST 1 VIEW COMPARISON:  03/25/2019 FINDINGS: Mild cardiac enlargement, unchanged. There are diffuse interstitial and airspace densities throughout both lungs. Unchanged compared with previous exam. No pleural effusion  identified. No pneumothorax. IMPRESSION: No change in bilateral interstitial and airspace opacities compatible with multifocal infection. Electronically Signed   By: Kerby Moors M.D.   On: 04/01/2019 08:59    Scheduled Meds: . Chlorhexidine Gluconate Cloth  6 each Topical Daily  . cholecalciferol  1,000 Units Oral Daily  . citalopram  40 mg Oral Daily  . enoxaparin (LOVENOX) injection  40 mg Subcutaneous Q24H  . feeding supplement (NEPRO CARB STEADY)  237 mL Oral TID BM  . feeding supplement (PRO-STAT SUGAR FREE 64)  30 mL Oral BID  . fluticasone  2 spray Each Nare Daily  . furosemide  20 mg Intravenous Daily  . insulin aspart  0-5 Units Subcutaneous QHS  . insulin aspart  0-9 Units Subcutaneous TID WC  . insulin aspart  4 Units Subcutaneous TID WC  . insulin detemir  20 Units Subcutaneous BID  . levothyroxine  175 mcg Oral Q0600  . nystatin  5 mL Oral QID   Continuous Infusions: . albumin human      Assessment/Plan:  1. Acute hypoxic respiratory failure secondary to COVID-19 infection and severe ARDS and pneumonia.  Currently on 6 L high flow nasal cannula.  I dialed down to 5 L high flow nasal cannula.  Patient cannot go home on high flow nasal cannula will have to be on regular nasal cannula.  High likelihood that she will need oxygen upon going home.  Patient received remdesivir and steroids during the hospital course.  Pulmonary added IV Lasix to see if we can get oxygenation better. 2. Weakness.  Continued physical therapy evaluation.  So far only walked 5 steps with physical therapy yesterday. 3. Acute kidney injury on chronic kidney disease stage III.  Creatinine has improved down to 1.15 from 1.58. 4. History of metastatic renal cell carcinoma 5. Type 2 diabetes mellitus, with hemoglobin A1c of 7.4.  Patient on 20 units of detemir insulin twice a day with short acting insulin. 6. Hypothyroidism unspecified on levothyroxine 7. History of thrush on nystatin swish and  swallow   Code Status:     Code Status Orders  (From admission, onward)         Start     Ordered   03/19/19 1526  Full code  Continuous     03/19/19 1525        Code Status History    Date Active Date Inactive Code Status Order ID Comments User Context   08/09/2014 1247 08/12/2014 1353 Full Code [id number]  Hooten,  Laurice Record, MD Inpatient   Advance Care Planning Activity    Advance Directive Documentation     Most Recent Value  Type of Advance Directive  Healthcare Power of Attorney  Pre-existing out of facility DNR order (yellow form or pink MOST form)  --  "MOST" Form in Place?  --     Family Communication: Spoke with daughter on the phone Disposition Plan: Patient needs to be off high flow nasal cannula and on regular nasal cannula before any disposition can be made.  Patient has had a long hospital course already.  Goal will be home with home health but also needs to walk a little bit better prior to making that decision.  Consultants:  Pulmonary  Time spent: 28 minutes  [place]

## 2019-04-02 NOTE — Care Management Important Message (Signed)
Important Message  Patient Details  Name: Rebecca Kirby MRN: [id number] Date of Birth: 11-27-1945   Medicare Important Message Given:  Yes     [name] 04/02/2019, 10:32 AM

## 2019-04-02 NOTE — Progress Notes (Signed)
Physical Therapy Treatment Patient Details Name: Rebecca Kirby MRN: [id number] DOB: 1945/07/03 Today's Date: 04/02/2019    History of Present Illness 74 year old female with a history of chronic anemia, anxiety disorder, history of colon CA, GERD, hypothyroidism renal cell carcinoma history of thyroid CA, pancreatic CA status post Whipple's procedure, diabetes, B TKA, hernia came into the ED due to worsening hypoxemia post COVID-19 infection.   MD assessment includes acute hypoxemic respiratory failure due to COVID19 induced bilateral multifocal pneumonia.    PT Comments    Pt in bed, asking to use commode.  6lpm O2 at rest.  Sats decreased to 80 with sitting EOB.  Increased to 8lpm to allow for mobility.  Takes extended rest after all movements but is able to transfer to commode then walk 5' to recliner with RW and generally steady gait but obvious fatigue and poor safety upon sitting "Tell me when" then flops down into chair without reaching back.  Sats did decrease to 75% despite 8 lpm but increased shortly with deep breathing.  Remained in recliner with O2 back at 6 lpm.   Pt doing well with transfers with +1 assist for mobility and ADL's.  She remains quite limited with mobility due to O2 sats.  While home is still realistic at this time given transfer status, she would benefit from a wheelchair as she is unable to ambulate household distances or to get out to appointments if discharged home at current level of mobility.   Patient suffers from respiratory failure which impairs his/her ability to perform daily activities like toileting, feeding, dressing, grooming, bathing in the home. A cane, walker, crutch will not resolve the patient's issue with performing activities of daily living. A lightweight wheelchair is required/recommended and will allow patient to safely perform daily activities.   Patient can safely propel the wheelchair in the home or has a caregiver who can provide assistance.     Follow Up Recommendations  Home health PT;Supervision for mobility/OOB     Equipment Recommendations  Rolling walker with 5" wheels;3in1 (PT);Wheelchair (measurements PT)    Recommendations for Other Services       Precautions / Restrictions Precautions Precautions: Fall Precaution Comments: watch O2 sats Restrictions Weight Bearing Restrictions: No Other Position/Activity Restrictions: Per MD notes, OK to participate with PT/OT with SpO2 >80%.    Mobility  Bed Mobility Overal bed mobility: Modified Independent             General bed mobility comments: use of rails  Transfers Overall transfer level: Needs assistance Equipment used: Rolling walker (2 wheeled) Transfers: Sit to/from Stand Sit to Stand: Min guard            Ambulation/Gait Ambulation/Gait assistance: Min guard Gait Distance (Feet): 5 Feet Assistive device: Rolling walker (2 wheeled) Gait Pattern/deviations: Step-through pattern;Decreased step length - right;Decreased step length - left Gait velocity: decreased   General Gait Details: [email or web address] then to recliner - fatigued with effort   Stairs             Wheelchair Mobility    Modified Rankin (Stroke Patients Only)       Balance Overall balance assessment: Needs assistance Sitting-balance support: Feet supported Sitting balance-Leahy Scale: Good     Standing balance support: Bilateral upper extremity supported;During functional activity Standing balance-Leahy Scale: Good Standing balance comment: Patient demonstrates good ability to ambulate short distance, however limited by O2 desaturation with activity.  Cognition Arousal/Alertness: Awake/alert Behavior During Therapy: WFL for tasks assessed/performed Overall Cognitive Status: Within Functional Limits for tasks assessed                                        Exercises Other Exercises Other Exercises: to  commode to void.    General Comments        Pertinent Vitals/Pain Pain Assessment: No/denies pain    Home Living                      Prior Function            PT Goals (current goals can now be found in the care plan section) Progress towards PT goals: Progressing toward goals    Frequency    Min 2X/week      PT Plan Current plan remains appropriate    Co-evaluation              AM-PAC PT "6 Clicks" Mobility   Outcome Measure  Help needed turning from your back to your side while in a flat bed without using bedrails?: A Little Help needed moving from lying on your back to sitting on the side of a flat bed without using bedrails?: A Little Help needed moving to and from a bed to a chair (including a wheelchair)?: A Little Help needed standing up from a chair using your arms (e.g., wheelchair or bedside chair)?: A Little Help needed to walk in hospital room?: A Little Help needed climbing 3-5 steps with a railing? : A Lot 6 Click Score: 17    End of Session Equipment Utilized During Treatment: Oxygen Activity Tolerance: Patient limited by fatigue;Other (comment) Patient left: in chair;with call bell/phone within reach;with chair alarm set Nurse Communication: Mobility status       Time: [id number] PT Time Calculation (min) (ACUTE ONLY): 24 min  Charges:  $Gait Training: 8-22 mins $Therapeutic Activity: 8-22 mins                    Chesley Noon, PTA 04/02/19, 11:54 AM

## 2019-04-02 NOTE — Progress Notes (Signed)
CRITICAL CARE PROGRESS NOTE    Name: Rebecca Kirby MRN: [id number] DOB: February 15, 1946     LOS: 56   SUBJECTIVE FINDINGS & SIGNIFICANT EVENTS   Patient description:   This is a very pleasant 74 year old female with a history of chronic anemia, anxiety disorder, history of colon CA, GERD, hypothyroidism renal cell carcinoma history of thyroid CA, pancreatic CA status post Whipple's procedure, diabetes, came into the ED due to worsening hypoxemia post COVID-19 infection.  Patient has been positive for COVID-19 since March 11, 2019 has initially been relatively minimally symptomatic with prednisone taper and Zithromax prescribed by primary care doctor on the 15th.  Over the last 48 hours patient had also developed fevers and loose stools T-max at 101.9 and again had similar fever and diarrhea today.  Her oxygen saturation started to get worse noted to be at approximately 78% on room air.  Patient generally does not like to go to the hospital and has initially declined to come in however her daughter was insisting that she comes in and get medical attention.  In the ED she was noted to be 85% on 2 L which improved with increased supplemental oxygen satting at approximately 90% on 5 to 6 L/min nasal cannula.  Chest x-ray was reviewed independently by me which is consistent with multilobar bilateral airspace opacification consistent with COVID-19 induced pneumonia.  In the ED she received her first dose of remdesivir as well as resuscitative IV fluids.  During my examination patient does not appear to be in distress he is nontachycardic with mild tachypnea respiratory rate ~25.  She had high-sensitivity troponins done today with a mild elevation at 29 and trending down to 27 with repeat.  Blood sugars have been somewhat elevated with  most recent at 197.  Procalcitonin is mildly elevated 0.38 as well as mild elevation BNP at 139.    Lines / Drains: PIVx2  Cultures / Sepsis markers: Respiratory culture -in progress MRSA PCT - negative Blood culture x2 - NTDx24h -reincubation in progress  Antimicrobials: zithromax 500 PO daily -finished CAP regimen Vecluri x 5 days per protocol- finished Ivermectin 9mg  po daily- d/cd 6 days ago  Protocols / Consultants: PCCM Hospitalist  Tests / Events: [place] - Lower extermities - negative for DVT bilaterally  04/01/19- transferred to medical floor from SDU/MICU 04/02/19 - patient on 5L/min [place] she lucid, able to take 42ml tidal volumes on incentive spirometer, good hand strength and ROMx4, was having breakfast without assistance during my evaluation in am, lung auscultation with mild crackles worse on right. Care plan discussed with daughter at bedside.    PAST MEDICAL HISTORY   Past Medical History:  Diagnosis Date  . Anemia   . Anxiety   . Colon cancer ([place])   . Diabetes mellitus without complication ([place])   . Family history of adverse reaction to anesthesia    daughter had reaction to anesthesia and was in icu  . GERD (gastroesophageal reflux disease)   . Hypertension   . Hypothyroidism   . Kidney cancer, primary, with metastasis from kidney to other site [facility]) 08/26/2014   right  . Pancreas cancer ([place]) 2018  . Thyroid cancer [facility])      SURGICAL HISTORY   Past Surgical History:  Procedure Laterality Date  . CHOLECYSTECTOMY    . COLON RESECTION    . COLONOSCOPY WITH PROPOFOL N/A 09/01/2015   Procedure: COLONOSCOPY WITH PROPOFOL;  Surgeon: Manya Silvas, MD;  Location: [facility] ENDOSCOPY;  Service: Endoscopy;  Laterality: N/A;  . CYSTOSCOPY  12/28/2016   Procedure: CYSTOSCOPY;  Surgeon: Ward, Honor Loh, MD;  Location: [facility] ORS;  Service: Gynecology;;  . ESOPHAGOGASTRODUODENOSCOPY (EGD) WITH PROPOFOL N/A 09/01/2015   Procedure: ESOPHAGOGASTRODUODENOSCOPY (EGD) WITH  PROPOFOL;  Surgeon: Manya Silvas, MD;  Location: [facility] ENDOSCOPY;  Service: Endoscopy;  Laterality: N/A;  . EUS N/A 06/28/2016   Procedure: FULL UPPER ENDOSCOPIC ULTRASOUND (EUS) RADIAL;  Surgeon: Jola Schmidt, MD;  Location: [facility] ENDOSCOPY;  Service: Endoscopy;  Laterality: N/A;  . EXCISION MASS NECK N/A 09/20/2015   Procedure: EXCISION MASS NECK;  Surgeon: Beverly Gust, MD;  Location: [place];  Service: ENT;  Laterality: N/A;  . LAPAROSCOPIC BILATERAL SALPINGO OOPHERECTOMY Bilateral 12/28/2016   Procedure: LAPAROSCOPIC BILATERAL SALPINGO OOPHORECTOMY;  Surgeon: Ward, Honor Loh, MD;  Location: [facility] ORS;  Service: Gynecology;  Laterality: Bilateral;  . LAPAROSCOPIC HYSTERECTOMY N/A 12/28/2016   Procedure: HYSTERECTOMY TOTAL LAPAROSCOPIC;  Surgeon: Ward, Honor Loh, MD;  Location: [facility] ORS;  Service: Gynecology;  Laterality: N/A;  . LAPAROSCOPIC LYSIS OF ADHESIONS  12/28/2016   Procedure: LAPAROSCOPIC EXTENSIVE LYSIS OF ADHESIONS;  Surgeon: Ward, Honor Loh, MD;  Location: [facility] ORS;  Service: Gynecology;;  . rt kidney removed Right   . THYROGLOSSAL DUCT CYST Right 09/20/2015   Procedure: excision sub mandibular gland right;  Surgeon: Beverly Gust, MD;  Location: [place];  Service: ENT;  Laterality: Right;  . THYROID SURGERY Bilateral   . TOTAL KNEE ARTHROPLASTY Right   . TOTAL KNEE ARTHROPLASTY Left 08/09/2014   Procedure: TOTAL KNEE ARTHROPLASTY;  Surgeon: Dereck Leep, MD;  Location: [facility] ORS;  Service: Orthopedics;  Laterality: Left;     FAMILY HISTORY   Family History  Problem Relation Age of Onset  . Breast cancer Maternal Aunt   . Breast cancer Cousin      SOCIAL HISTORY   Social History   Tobacco Use  . Smoking status: Former Smoker    Quit date: 07/29/1994    Years since quitting: 24.6  . Smokeless tobacco: Never Used  Substance Use Topics  . Alcohol use: No  . Drug use: No     MEDICATIONS   Current Medication:  Current  Facility-Administered Medications:  .  acetaminophen (TYLENOL) tablet 650 mg, 650 mg, Oral, Q6H PRN, Jennye Boroughs, MD, 325 mg at 04/01/19 1951 .  albumin human 25 % solution 12.5 g, 12.5 g, Intravenous, Daily, Lilliah Priego, MD .  albuterol (VENTOLIN HFA) 108 (90 Base) MCG/ACT inhaler 2 puff, 2 puff, Inhalation, Q6H PRN, Flora Lipps, MD, 2 puff at 03/26/19 0541 .  Chlorhexidine Gluconate Cloth 2 % PADS 6 each, 6 each, Topical, Daily, Ottie Glazier, MD, 6 each at 04/02/19 0845 .  cholecalciferol (VITAMIN D3) tablet 1,000 Units, 1,000 Units, Oral, Daily, Jennye Boroughs, MD, 1,000 Units at 04/02/19 0844 .  citalopram (CELEXA) tablet 40 mg, 40 mg, Oral, Daily, Jennye Boroughs, MD, 40 mg at 04/02/19 0845 .  enoxaparin (LOVENOX) injection 40 mg, 40 mg, Subcutaneous, Q24H, Jennye Boroughs, MD, 40 mg at 04/01/19 2217 .  feeding supplement (NEPRO CARB STEADY) liquid 237 mL, 237 mL, Oral, TID BM, Kasa, Kurian, MD, Last Rate: 0 mL/hr at 03/25/19 2204, 237 mL at 04/01/19 1551 .  feeding supplement (PRO-STAT SUGAR FREE 64) liquid 30 mL, 30 mL, Oral, BID, Kasa, Kurian, MD, 30 mL at 03/31/19 1059 .  fluticasone (FLONASE) 50 MCG/ACT nasal spray 2 spray, 2 spray, Each Nare, Daily, Jennye Boroughs, MD, 2 spray at 04/01/19 1106 .  furosemide (LASIX)  injection 20 mg, 20 mg, Intravenous, Daily, Myca Perno, MD .  insulin aspart (novoLOG) injection 0-5 Units, 0-5 Units, Subcutaneous, QHS, Awilda Bill, NP, 3 Units at 04/01/19 2215 .  insulin aspart (novoLOG) injection 0-9 Units, 0-9 Units, Subcutaneous, TID WC, Dallie Piles, RPH, 5 Units at 04/01/19 1700 .  insulin aspart (novoLOG) injection 4 Units, 4 Units, Subcutaneous, TID WC, Dallie Piles, RPH, 4 Units at 04/01/19 1700 .  insulin detemir (LEVEMIR) injection 20 Units, 20 Units, Subcutaneous, BID, Dallie Piles, RPH, 20 Units at 04/01/19 2216 .  levothyroxine (SYNTHROID) tablet 175 mcg, 175 mcg, Oral, Q0600, Jennye Boroughs, MD, 175 mcg at 04/02/19  0845 .  nystatin (MYCOSTATIN) 100000 UNIT/ML suspension 500,000 Units, 5 mL, Oral, QID, Awilda Bill, NP, 500,000 Units at 04/02/19 0844    ALLERGIES   Statins    REVIEW OF SYSTEMS     10 point ROS neg except as per sujective findings  PHYSICAL EXAMINATION   Vital Signs: Temp:  [97.8 F (36.6 C)-98.6 F (37 C)] 98.4 F (36.9 C) (02/04 0830) Pulse Rate:  [74-91] 91 (02/04 0830) Resp:  [17-21] 20 (02/04 0830) BP: (106-122)/(42-50) 122/50 (02/04 0830) SpO2:  [92 %-98 %] 92 % (02/03 2354)  GENERAL:well nourished, NAD HEAD: Normocephalic, atraumatic.  EYES: Pupils equal, round, reactive to light.  No scleral icterus.  MOUTH: Moist mucosal membrane. NECK: Supple. No thyromegaly. No nodules. No JVD.  PULMONARY: bilateral rhonchorous breath sounds with mild crackles at bases bilatearally CARDIOVASCULAR: S1 and S2. Regular rate and rhythm. No murmurs, rubs, or gallops.  GASTROINTESTINAL: Soft, nontender, non-distended. No masses. Positive bowel sounds. No hepatosplenomegaly.  MUSCULOSKELETAL: No swelling, clubbing, or edema.  NEUROLOGIC: Mild distress due to acute illness SKIN:intact,warm,dry   PERTINENT DATA     Infusions: . albumin human     Scheduled Medications: . Chlorhexidine Gluconate Cloth  6 each Topical Daily  . cholecalciferol  1,000 Units Oral Daily  . citalopram  40 mg Oral Daily  . enoxaparin (LOVENOX) injection  40 mg Subcutaneous Q24H  . feeding supplement (NEPRO CARB STEADY)  237 mL Oral TID BM  . feeding supplement (PRO-STAT SUGAR FREE 64)  30 mL Oral BID  . fluticasone  2 spray Each Nare Daily  . furosemide  20 mg Intravenous Daily  . insulin aspart  0-5 Units Subcutaneous QHS  . insulin aspart  0-9 Units Subcutaneous TID WC  . insulin aspart  4 Units Subcutaneous TID WC  . insulin detemir  20 Units Subcutaneous BID  . levothyroxine  175 mcg Oral Q0600  . nystatin  5 mL Oral QID   PRN Medications: acetaminophen, albuterol Hemodynamic  parameters:   Intake/Output: 02/03 0701 - 02/04 0700 In: -  Out: 450 [Urine:450]  Ventilator  Settings:      LAB RESULTS:  Basic Metabolic Panel: Recent Labs  Lab 03/27/19 0452 03/27/19 0452 03/28/19 0447 03/28/19 0447 03/29/19 0411 03/29/19 0411 03/30/19 0518 03/30/19 0518 03/31/19 0225 03/31/19 0225 04/01/19 0554 04/02/19 0315  NA 133*   < > 136   < > 133*  --  135  --  136  --  132* 132*  K 5.2*   < > 4.9   < > 4.7   < > 5.1   < > 4.3   < > 4.6 4.1  CL 99   < > 99   < > 98  --  98  --  99  --  96* 97*  CO2 22   < >  25   < > 24  --  30  --  27  --  29 28  GLUCOSE 296*   < > 237*   < > 235*  --  226*  --  66*  --  190* 160*  BUN 70*   < > 81*   < > 74*  --  68*  --  70*  --  58* 43*  CREATININE 1.34*   < > 1.39*   < > 1.12*  --  1.19*  --  1.18*  --  1.31* 1.15*  CALCIUM 8.3*   < > 8.6*   < > 8.4*  --  8.6*  --  9.0  --  8.7* 8.6*  MG 2.2  --  2.3  --   --   --  2.2  --   --   --  1.9 1.8  PHOS 6.6*   < > 6.5*   < > 5.3*  --  5.2*  5.6*  --  4.3  --  5.2* 3.5   < > = values in this interval not displayed.   Liver Function Tests: Recent Labs  Lab 03/30/19 0518 03/31/19 0225 04/02/19 0315  ALBUMIN 2.3* 2.3* 2.1*   No results for input(s): LIPASE, AMYLASE in the last 168 hours. No results for input(s): AMMONIA in the last 168 hours. CBC: Recent Labs  Lab 03/28/19 0447 03/29/19 0411 03/30/19 0518 04/01/19 0554  WBC 14.0* 15.7* 13.5* 15.2*  NEUTROABS 13.0* 14.7*  --  13.9*  HGB 9.5* 9.3* 9.3* 9.2*  HCT 29.5* 28.5* 28.8* 28.8*  MCV 82.4 82.8 83.2 83.0  PLT 234 221 200 136*   Cardiac Enzymes: No results for input(s): CKTOTAL, CKMB, CKMBINDEX, TROPONINI in the last 168 hours. BNP: Invalid input(s): POCBNP CBG: Recent Labs  Lab 04/01/19 1706 04/01/19 2122 04/01/19 2205 04/01/19 2338 04/02/19 0827  GLUCAP 260* 347* 294* 211* 106*     IMAGING RESULTS:  Imaging: DG Chest Port 1 View  Result Date: 04/01/2019 CLINICAL DATA:  Respiratory failure.  EXAM: PORTABLE CHEST 1 VIEW COMPARISON:  03/25/2019 FINDINGS: Mild cardiac enlargement, unchanged. There are diffuse interstitial and airspace densities throughout both lungs. Unchanged compared with previous exam. No pleural effusion identified. No pneumothorax. IMPRESSION: No change in bilateral interstitial and airspace opacities compatible with multifocal infection. Electronically Signed   By: Kerby Moors M.D.   On: 04/01/2019 08:59            ASSESSMENT AND PLAN    -Multidisciplinary rounds held today  Acute hypoxemic respiratory failure  - due to COVID19 induced bilateral multifocal pneumonia -continue ISS with ACHS blood sugar checks - PT/OT is priority at this point - initiate MetaNEB - patient is out of COVID isolation now -D/c alvesco / duonebs - will keep Albuterol only  -stopped systemic steroids - lasix 20 iv daily - auscultation with crackles worse on right compared to previous - total 10L net negative  - bronchopulmonary hygiene with albuterol and incentive spirometer -continue to encourage patient participation -Beta-D-Glucan - <31 - normal  -appreciate hospitalist team collaboration -continue to wean down O2 - currently at 5L/min  - O2 goal at >80% is acceptable   Moderate protein calorie malnutrition  - Albumin <2.8 , muscular weakness/deconditioning  - nourishment - continue supplement with meals daily   - 12.5 mg Albumin infusion - 25% daily IV    Multiple comorbid conditions -Hx of RCC, Pancreatic CA, thyroid CA, colon CA -hx of liver cirrosis and CKD -  predisposing to hypercoagulable state -currently on Lovenox PPX dose 40 [place] daily will increase to bid while acutely ill.    Essential HTN   stopped norvasc, bystolic and ibersartan -to decrease nephro and hepatotoxicity while acutely ill.  Monitoring BP closely , thus far well controlled  -HR and BP have been in reference range on this regimen and patient states she is not anxious.    Chronic  diastilic CHF - Grade I  - strict I&Os  - gentle diuresis in lieu of CKD  - fluid restriction to 1200cc/24h  -repeat CXR daily    Hypothyroidism -continue home synthroid   Chronic Renal Failure -follow chem 7 -follow UO -continue PureWick external urinary Catheter-assess need daily -patient has been on diuretic 20 bid Lasix - strict I&Os-now10L net negative -limit PO fluid intake to 1200cc/24h -stop non-essential nephrotoxic medications - d/c protonix      GI/Nutrition GI PROPHYLAXIS as indicated-stopping PPI  DIET-->TF's as tolerated Constipation protocol as indicated  ENDO - ICU hypoglycemic\Hyperglycemia protocol -check FSBS per protocol - currently q4h    ELECTROLYTES -follow labs only as needed -replace as needed -pharmacy consultation   DVT/GI PRX ordered -SCDs  TRANSFUSIONS AS NEEDED MONITOR FSBS ASSESS the need for LABS as needed     This document was prepared using Dragon voice recognition software and may include unintentional dictation errors.    Ottie Glazier, M.D.  Division of [place]

## 2019-04-02 NOTE — Progress Notes (Signed)
Pt denied both feeding supplements. Stated, "They're nasty tasting."

## 2019-04-02 NOTE — TOC Initial Note (Signed)
Transition of Care [facility]) - Initial/Assessment Note    Patient Details  Name: Rebecca Kirby MRN: [id number] Date of Birth: Jul 23, 1945  Transition of Care [facility]) CM/SW Contact:    Elease Hashimoto, LCSW Phone Number: 04/02/2019, 1:22 PM  Clinical Narrative:   Met with pt and daughter-[name] who was at the bedside. Discussed discharge needs. Pt is aware she will need home O2 and follow up therapies. She wants this worker to find a home health agency to take her insurance she is aware of the difficulty with this in the past. Daughter reports someone will be with pt at discharge, she will not be left alone. She has many family members who are involved and will assist if she needs this. Pt is very independent and prides herself on this. This COVID has taken its toll and she is tired of it. She is feeling somewhat better today and atleast can have a visitor. Will work on discharge needs and follow along.              Expected Discharge Plan: [place] Barriers to Discharge: Continued Medical Work up   Patient Goals and CMS Choice Patient states their goals for this hospitalization and ongoing recovery are:: Hope to get out of here and not have to come back      Expected Discharge Plan and Services Expected Discharge Plan: [place] In-house Referral: Clinical Social Work     Living arrangements for the past 2 months: Single Family Home                           HH Arranged: PT, RN [place] Agency: [facility] Date [place]: 04/02/19 Time [place]: 1321 Representative spoke with at [place]: [place] Arrangements/Services Living arrangements for the past 2 months: [place] with:: Self Patient language and need for interpreter reviewed:: No        Need for Family Participation in Patient Care: Yes (Comment) Care giver support system in place?: Yes (comment) Current home services: DME(has  rw)    Activities of Daily Living Home Assistive Devices/Equipment: None ADL Screening (condition at time of admission) Patient's cognitive ability adequate to safely complete daily activities?: Yes Is the patient deaf or have difficulty hearing?: No Does the patient have difficulty seeing, even when wearing glasses/contacts?: No Does the patient have difficulty concentrating, remembering, or making decisions?: No Patient able to express need for assistance with ADLs?: No Does the patient have difficulty dressing or bathing?: No Independently performs ADLs?: Yes (appropriate for developmental age) Does the patient have difficulty walking or climbing stairs?: No Weakness of Legs: None Weakness of Arms/Hands: None  Permission Sought/Granted Permission sought to share information with : Chartered certified accountant granted to share information with : Yes, Verbal Permission Granted  Share Information with NAME: Cassie  Permission granted to share info w AGENCY: [facility]        Emotional Assessment Appearance:: Appears stated age Attitude/Demeanor/Rapport: Engaged Affect (typically observed): Calm, Adaptable Orientation: : Oriented to Self, Oriented to Place, Oriented to  Time, Oriented to Situation      Admission diagnosis:  Acute respiratory failure with hypoxia ([place]) [J96.01] Pneumonia due to COVID-19 virus [U07.1, J12.82] COVID-19 [U07.1] Patient Active Problem List   Diagnosis Date Noted  . Weakness   . Acute kidney injury superimposed on CKD ([place])   . Hypothyroidism   .  Renal cell carcinoma ([place])   . Pneumonia due to COVID-19 virus 03/19/2019  . Acute hypoxemic respiratory failure due to COVID-19 ([place]) 03/19/2019  . Hyponatremia 03/19/2019  . Lung nodule, solitary 07/03/2016  . Cancer of right kidney ([place]) 11/03/2015  . Iron deficiency anemia due to chronic blood loss 09/02/2015  . Cirrhosis ([place]) 09/02/2015  . Cirrhosis of liver without ascites ([place])  09/02/2015  . Malignant neoplasm of colon ([place]) 03/16/2015  . Diabetes mellitus ([place]) 03/16/2015  . Broken ulna 03/16/2015  . Personal history of healed traumatic fracture 03/16/2015  . BP (high blood pressure) 03/16/2015  . Impaired renal function 03/16/2015  . Avitaminosis D 03/16/2015  . Lump in thyroid 01/05/2015  . 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 Artificial knee joint present 08/09/2014  . Anemia in chronic illness 02/04/2014   PCP:  Idelle Crouch, MD Pharmacy:   [place] [place] [address] [place] [address] Phone: [phone] Fax: [place], [place] - [place] [facility] [address] AT [place] [facility] [place] [address] Phone: [phone] Fax: [phone]     Social Determinants of Health (SDOH) Interventions    Readmission Risk Interventions No flowsheet data found.

## 2019-04-02 NOTE — Consult Note (Signed)
PHARMACY CONSULT NOTE - FOLLOW UP  Pharmacy Consult for Electrolyte Monitoring and Replacement   Rebecca Kirby is a 74 y.o. female admitted on 03/19/2019 due to worsening hypoxemia post COVID-19 infection, along with acute hypoxemic respiratory failure; pt tested positive on 03/11/2019. CXRs were consistent with COVID-19 induced pneumonia. PMH includes chronic anemia, anxiety disorder, hx of colon CA, pancreatic CA, diabetes, hypothyroidism, renal cell carcinoma, and history of thyroid CA.  Patient is on Phoslo at home for hyperphosphatemia.  Pharmacy consulted to assist with monitoring and replacing electrolytes.   Recent Labs: Potassium (mmol/L)  Date Value  04/02/2019 4.1  03/05/2014 4.6   Magnesium (mg/dL)  Date Value  04/02/2019 1.8   Calcium (mg/dL)  Date Value  04/02/2019 8.6 (L)   Calcium, Total (mg/dL)  Date Value  03/05/2014 9.5   Albumin (g/dL)  Date Value  04/02/2019 2.1 (L)  02/10/2014 3.1 (L)   Phosphorus (mg/dL)  Date Value  04/02/2019 3.5   Sodium (mmol/L)  Date Value  04/02/2019 132 (L)  03/05/2014 138   Corrected Ca:  10.12 mg/dL  Assessment:  1. Electrolytes:  Goal of therapy: all electrolytes WNL.    Potassium remains wnl off of [place]  Sodium noted to be borderline low  Phoslo increased to 1334 mg TIDWM on 1/29: improved  2. Glucose:    Patient has diabetes and has been on IV steroids stopped  1/31.  Current orders include sensitive SSI TIDWM, Levemir 20 units BID, and novolog 4 units TIDWM.  Continue to monitor SBG daily.   3. Constipation:  Last BM 2/2  Pt has transferred from CCU to floor - electrolytes stable  Pharmacy will sign off consult at this time.  [name], PharmD, BCPS Clinical Pharmacist 04/02/2019 8:20 AM

## 2019-04-02 NOTE — Progress Notes (Signed)
OT Cancellation Note  Patient Details Name: Rebecca Kirby MRN: [id number] DOB: 1945-10-30   Cancelled Treatment:    Reason Eval/Treat Not Completed: Other (comment). Respiratory therapy in with pt upon attempt. Will re-attempt OT tx at later date/time as pt is available and medically appropriate.   Jeni Salles, MPH, MS, OTR/L ascom [phone] 04/02/19, 3:32 PM

## 2019-04-03 ENCOUNTER — Inpatient Hospital Stay: Payer: Medicare Other

## 2019-04-03 LAB — BASIC METABOLIC PANEL
Anion gap: 11 (ref 5–15)
BUN: 41 mg/dL — ABNORMAL HIGH (ref 8–23)
CO2: 26 mmol/L (ref 22–32)
Calcium: 8.7 mg/dL — ABNORMAL LOW (ref 8.9–10.3)
Chloride: 99 mmol/L (ref 98–111)
Creatinine, Ser: 1.31 mg/dL — ABNORMAL HIGH (ref 0.44–1.00)
GFR calc Af Amer: 46 mL/min — ABNORMAL LOW (ref >60)
GFR calc non Af Amer: 40 mL/min — ABNORMAL LOW (ref >60)
Glucose, Bld: 97 mg/dL (ref 70–99)
Potassium: 4.1 mmol/L (ref 3.5–5.1)
Sodium: 136 mmol/L (ref 135–145)

## 2019-04-03 LAB — GLUCOSE, CAPILLARY
Glucose-Capillary: 61 mg/dL — ABNORMAL LOW (ref 70–99)
Glucose-Capillary: 154 mg/dL — ABNORMAL HIGH (ref 70–99)
Glucose-Capillary: 147 mg/dL — ABNORMAL HIGH (ref 70–99)
Glucose-Capillary: 192 mg/dL — ABNORMAL HIGH (ref 70–99)
Glucose-Capillary: 278 mg/dL — ABNORMAL HIGH (ref 70–99)

## 2019-04-03 MED ORDER — ENSURE MAX PROTEIN PO LIQD
11.0000 [oz_av] | Freq: Two times a day (BID) | ORAL | Status: DC
  Administered 2019-04-03 – 2019-04-06 (×5): 11 [oz_av] via ORAL
  Filled 2019-04-03: qty 330

## 2019-04-03 MED ORDER — INSULIN DETEMIR 100 UNIT/ML ~~LOC~~ SOLN
15.0000 [IU] | Freq: Every day | SUBCUTANEOUS | Status: DC
  Administered 2019-04-03: 22:00:00 15 [IU] via SUBCUTANEOUS
  Filled 2019-04-03 (×2): qty 0.15

## 2019-04-03 MED ORDER — SULFAMETHOXAZOLE-TRIMETHOPRIM 400-80 MG PO TABS
1.0000 | ORAL_TABLET | Freq: Two times a day (BID) | ORAL | Status: DC
  Administered 2019-04-03 – 2019-04-07 (×9): 1 via ORAL
  Filled 2019-04-03 (×11): qty 1

## 2019-04-03 NOTE — TOC Progression Note (Signed)
Transition of Care [facility]) - Progression Note    Patient Details  Name: Rebecca Kirby MRN: [id number] Date of Birth: Jun 16, 1945  Transition of Care [facility]) CM/SW Contact  Karalynn Cottone, [place], LCSW Phone Number: 04/03/2019, 12:03 PM  Clinical Narrative:   Met with pt and spoke with daughter-[name] via telephone to discuss possible discharge over the weekend. MD feels pt will need home O2, wheelchair and follow up therapies. Have contacted [employer] for O2 and wheelchair. [facility] will provide the home health therapies-PT, OT and RN. Pt to go to daughter's home at [place]. Her address is: [place] [address]. Daughter asked PT to work on steps since she has four steps into her home. Have secured chat PT regarding this. He will when he is with her today.  Pt has other pieces of equipment-rw, bsc and tub seat. Will continue to work on discharge needs for the weekend.    Expected Discharge Plan: [place] Barriers to Discharge: Continued Medical Work up  Expected Discharge Plan and Services Expected Discharge Plan: [place] In-house Referral: Clinical Social Work     Living arrangements for the past 2 months: Single Family Home                           HH Arranged: PT, RN [place] Agency: [facility] Date [place]: 04/02/19 Time [place]: 1321 Representative spoke with at [place]: [place]   Social Determinants of Health ([place]) Interventions    Readmission Risk Interventions No flowsheet data found.

## 2019-04-03 NOTE — Progress Notes (Signed)
PULMONARY PROGRESS NOTE    Name: Rebecca Kirby MRN: [id number] DOB: 11-Sep-1945     LOS: 74   SUBJECTIVE FINDINGS & SIGNIFICANT EVENTS   Patient description:   This is a very pleasant 74 year old female with complex comorbid history s/p COVID 19 pneumonitis with acute hypoxemic respiratory failure   Lines / Drains: PIVx2  Cultures / Sepsis markers: Respiratory culture -in progress MRSA PCT - negative Blood culture x2 - NTDx24h -reincubation in progress  Antimicrobials: zithromax 500 PO daily -finished CAP regimen Vecluri x 5 days per protocol- finished Ivermectin 9mg  po daily- d/cd 6 days ago  Protocols / Consultants: PCCM Hospitalist  Tests / Events: [place] - Lower extermities - negative for DVT bilaterally  04/01/19- transferred to medical floor from SDU/MICU 04/02/19 - patient on 5L/min [place] she lucid, able to take 468ml tidal volumes on incentive spirometer, good hand strength and ROMx4, was having breakfast without assistance during my evaluation in am, lung auscultation with mild crackles worse on right. Care plan discussed with daughter at bedside.   04/03/19 -case discussed with PT plan for hallway exertional walk today, discussed with primary attending will plan for possible d/c home tommorow if continues to improve today with decreased O2 req.  Discusssed with [employer] - O2 concentrator ready for home d/c.  Reviewed CXR today -as per radiologist improved from previous.   PAST MEDICAL HISTORY   Past Medical History:  Diagnosis Date  . Anemia   . Anxiety   . Colon cancer ([place])   . Diabetes mellitus without complication ([place])   . Family history of adverse reaction to anesthesia    daughter had reaction to anesthesia and was in icu  . GERD (gastroesophageal reflux disease)   . Hypertension   .  Hypothyroidism   . Kidney cancer, primary, with metastasis from kidney to other site [facility]) 08/26/2014   right  . Pancreas cancer ([place]) 2018  . Thyroid cancer [facility])      SURGICAL HISTORY   Past Surgical History:  Procedure Laterality Date  . CHOLECYSTECTOMY    . COLON RESECTION    . COLONOSCOPY WITH PROPOFOL N/A 09/01/2015   Procedure: COLONOSCOPY WITH PROPOFOL;  Surgeon: Manya Silvas, MD;  Location: [facility] ENDOSCOPY;  Service: Endoscopy;  Laterality: N/A;  . CYSTOSCOPY  12/28/2016   Procedure: CYSTOSCOPY;  Surgeon: Ward, Honor Loh, MD;  Location: [facility] ORS;  Service: Gynecology;;  . ESOPHAGOGASTRODUODENOSCOPY (EGD) WITH PROPOFOL N/A 09/01/2015   Procedure: ESOPHAGOGASTRODUODENOSCOPY (EGD) WITH PROPOFOL;  Surgeon: Manya Silvas, MD;  Location[facility] ENDOSCOPY;  Service: Endoscopy;  Laterality: N/A;  . EUS N/A 06/28/2016   Procedure: FULL UPPER ENDOSCOPIC ULTRASOUND (EUS) RADIAL;  Surgeon: Jola Schmidt, MD;  Location: [facility] ENDOSCOPY;  Service: Endoscopy;  Laterality: N/A;  . EXCISION MASS NECK N/A 09/20/2015   Procedure: EXCISION MASS NECK;  Surgeon: Beverly Gust, MD;  Location: [place];  Service: ENT;  Laterality: N/A;  . LAPAROSCOPIC BILATERAL SALPINGO OOPHERECTOMY Bilateral 12/28/2016   Procedure: LAPAROSCOPIC BILATERAL SALPINGO OOPHORECTOMY;  Surgeon: Ward, Honor Loh, MD;  Location: [facility] ORS;  Service: Gynecology;  Laterality: Bilateral;  . LAPAROSCOPIC HYSTERECTOMY N/A 12/28/2016   Procedure: HYSTERECTOMY TOTAL LAPAROSCOPIC;  Surgeon: Ward, Honor Loh, MD;  Location: [facility] ORS;  Service: Gynecology;  Laterality: N/A;  . LAPAROSCOPIC LYSIS OF ADHESIONS  12/28/2016   Procedure: LAPAROSCOPIC EXTENSIVE LYSIS OF ADHESIONS;  Surgeon: Ward, Honor Loh, MD;  Location: [facility] ORS;  Service: Gynecology;;  . rt kidney removed Right   . THYROGLOSSAL DUCT  CYST Right 09/20/2015   Procedure: excision sub mandibular gland right;  Surgeon: Beverly Gust, MD;  Location: [place];   Service: ENT;  Laterality: Right;  . THYROID SURGERY Bilateral   . TOTAL KNEE ARTHROPLASTY Right   . TOTAL KNEE ARTHROPLASTY Left 08/09/2014   Procedure: TOTAL KNEE ARTHROPLASTY;  Surgeon: Dereck Leep, MD;  Location: [facility] ORS;  Service: Orthopedics;  Laterality: Left;     FAMILY HISTORY   Family History  Problem Relation Age of Onset  . Breast cancer Maternal Aunt   . Breast cancer Cousin      SOCIAL HISTORY   Social History   Tobacco Use  . Smoking status: Former Smoker    Quit date: 07/29/1994    Years since quitting: 24.6  . Smokeless tobacco: Never Used  Substance Use Topics  . Alcohol use: No  . Drug use: No     MEDICATIONS   Current Medication:  Current Facility-Administered Medications:  .  acetaminophen (TYLENOL) tablet 650 mg, 650 mg, Oral, Q6H PRN, Jennye Boroughs, MD, 325 mg at 04/01/19 1951 .  albumin human 25 % solution 12.5 g, 12.5 g, Intravenous, Daily, Lanney Gins, Hieu Herms, MD, Last Rate: 60 mL/hr at 04/03/19 0958, 12.5 g at 04/03/19 0958 .  albuterol (VENTOLIN HFA) 108 (90 Base) MCG/ACT inhaler 2 puff, 2 puff, Inhalation, Q6H PRN, Flora Lipps, MD, 2 puff at 03/26/19 0541 .  Chlorhexidine Gluconate Cloth 2 % PADS 6 each, 6 each, Topical, Daily, Ottie Glazier, MD, 6 each at 04/02/19 0845 .  cholecalciferol (VITAMIN D3) tablet 1,000 Units, 1,000 Units, Oral, Daily, Jennye Boroughs, MD, 1,000 Units at 04/03/19 [phone] .  citalopram (CELEXA) tablet 40 mg, 40 mg, Oral, Daily, Jennye Boroughs, MD, 40 mg at 04/03/19 0929 .  enoxaparin (LOVENOX) injection 40 mg, 40 mg, Subcutaneous, Q24H, Jennye Boroughs, MD, 40 mg at 04/02/19 2221 .  fluticasone (FLONASE) 50 MCG/ACT nasal spray 2 spray, 2 spray, Each Nare, Daily, Jennye Boroughs, MD, 2 spray at 04/03/19 1203 .  furosemide (LASIX) injection 20 mg, 20 mg, Intravenous, Daily, Lanney Gins, Eural Holzschuh, MD, 20 mg at 04/03/19 0929 .  insulin aspart (novoLOG) injection 0-5 Units, 0-5 Units, Subcutaneous, QHS, Awilda Bill, NP, 3  Units at 04/01/19 2215 .  insulin aspart (novoLOG) injection 0-9 Units, 0-9 Units, Subcutaneous, TID WC, Dallie Piles, RPH, 1 Units at 04/03/19 1202 .  insulin aspart (novoLOG) injection 4 Units, 4 Units, Subcutaneous, TID WC, Dallie Piles, RPH, 4 Units at 04/03/19 1202 .  insulin detemir (LEVEMIR) injection 15 Units, 15 Units, Subcutaneous, QHS, Wieting, Richard, MD .  levothyroxine (SYNTHROID) tablet 175 mcg, 175 mcg, Oral, Q0600, Jennye Boroughs, MD, 175 mcg at 04/03/19 0929 .  nystatin (MYCOSTATIN) 100000 UNIT/ML suspension 500,000 Units, 5 mL, Oral, QID, Awilda Bill, NP, 500,000 Units at 04/03/19 [phone] .  protein supplement (ENSURE MAX) liquid, 11 oz, Oral, BID BM, Wieting, Richard, MD    ALLERGIES   Statins    REVIEW OF SYSTEMS     10 point ROS neg except as per sujective findings  PHYSICAL EXAMINATION   Vital Signs: Temp:  [98.3 F (36.8 C)-99.2 F (37.3 C)] 98.3 F (36.8 C) (02/05 0739) Pulse Rate:  [72-88] 72 (02/05 0739) Resp:  [16] 16 (02/05 0739) BP: (113-124)/(48-53) 113/53 (02/05 0739) SpO2:  [84 %-92 %] 91 % (02/05 0739)  GENERAL:well nourished, NAD HEAD: Normocephalic, atraumatic.  EYES: Pupils equal, round, reactive to light.  No scleral icterus.  MOUTH: Moist mucosal membrane. NECK: Supple. No  thyromegaly. No nodules. No JVD.  PULMONARY: decreased breath sounds bilaterally CARDIOVASCULAR: S1 and S2. Regular rate and rhythm. No murmurs, rubs, or gallops.  GASTROINTESTINAL: Soft, nontender, non-distended. No masses. Positive bowel sounds. No hepatosplenomegaly.  MUSCULOSKELETAL: No swelling, clubbing, or edema.  NEUROLOGIC: Mild distress due to acute illness SKIN:intact,warm,dry   PERTINENT DATA     Infusions: . albumin human 12.5 g (04/03/19 0958)   Scheduled Medications: . Chlorhexidine Gluconate Cloth  6 each Topical Daily  . cholecalciferol  1,000 Units Oral Daily  . citalopram  40 mg Oral Daily  . enoxaparin (LOVENOX) injection   40 mg Subcutaneous Q24H  . fluticasone  2 spray Each Nare Daily  . furosemide  20 mg Intravenous Daily  . insulin aspart  0-5 Units Subcutaneous QHS  . insulin aspart  0-9 Units Subcutaneous TID WC  . insulin aspart  4 Units Subcutaneous TID WC  . insulin detemir  15 Units Subcutaneous QHS  . levothyroxine  175 mcg Oral Q0600  . nystatin  5 mL Oral QID  . Ensure Max Protein  11 oz Oral BID BM   PRN Medications: acetaminophen, albuterol Hemodynamic parameters:   Intake/Output: 02/04 0701 - 02/05 0700 In: 277.4 [P.O.:240; IV Piggyback:37.4] Out: 150 [Urine:150]  Ventilator  Settings:      LAB RESULTS:  Basic Metabolic Panel: Recent Labs  Lab 03/28/19 0447 03/28/19 0447 03/29/19 0411 03/29/19 0411 03/30/19 0518 03/30/19 0518 03/31/19 0225 03/31/19 0225 04/01/19 0554 04/01/19 0554 04/02/19 0315 04/03/19 0351  NA 136   < > 133*   < > 135  --  136  --  132*  --  132* 136  K 4.9   < > 4.7   < > 5.1   < > 4.3   < > 4.6   < > 4.1 4.1  CL 99   < > 98   < > 98  --  99  --  96*  --  97* 99  CO2 25   < > 24   < > 30  --  27  --  29  --  28 26  GLUCOSE 237*   < > 235*   < > 226*  --  66*  --  190*  --  160* 97  BUN 81*   < > 74*   < > 68*  --  70*  --  58*  --  43* 41*  CREATININE 1.39*   < > 1.12*   < > 1.19*  --  1.18*  --  1.31*  --  1.15* 1.31*  CALCIUM 8.6*   < > 8.4*   < > 8.6*  --  9.0  --  8.7*  --  8.6* 8.7*  MG 2.3  --   --   --  2.2  --   --   --  1.9  --  1.8  --   PHOS 6.5*   < > 5.3*  --  5.2*  5.6*  --  4.3  --  5.2*  --  3.5  --    < > = values in this interval not displayed.   Liver Function Tests: Recent Labs  Lab 03/30/19 0518 03/31/19 0225 04/02/19 0315  ALBUMIN 2.3* 2.3* 2.1*   No results for input(s): LIPASE, AMYLASE in the last 168 hours. No results for input(s): AMMONIA in the last 168 hours. CBC: Recent Labs  Lab 03/28/19 0447 03/29/19 0411 03/30/19 0518 04/01/19 0554  WBC 14.0* 15.7* 13.5* 15.2*  NEUTROABS 13.0* 14.7*  --  13.9*   HGB 9.5* 9.3* 9.3* 9.2*  HCT 29.5* 28.5* 28.8* 28.8*  MCV 82.4 82.8 83.2 83.0  PLT 234 221 200 136*   Cardiac Enzymes: No results for input(s): CKTOTAL, CKMB, CKMBINDEX, TROPONINI in the last 168 hours. BNP: Invalid input(s): POCBNP CBG: Recent Labs  Lab 04/02/19 1709 04/02/19 2141 04/03/19 0737 04/03/19 0927 04/03/19 1147  GLUCAP 130* 189* 61* 154* 147*     IMAGING RESULTS:  Imaging: DG Chest Port 1 View  Result Date: 04/03/2019 CLINICAL DATA:  COVID-19 pneumonia. EXAM: PORTABLE CHEST 1 VIEW COMPARISON:  Single-view of the chest 04/01/2019 and 03/25/2019. FINDINGS: Extensive bilateral airspace disease is worse on the left. Aeration in the right lower lung zone has mildly improved compared to the most recent exam. Heart size is upper normal. No pneumothorax or pleural effusion. IMPRESSION: Left worse than right airspace disease persists. Aeration in the right lower lung zone has mildly improved since the most recent exam. Electronically Signed   By: Inge Rise M.D.   On: 04/03/2019 14:03             ASSESSMENT AND PLAN    -Multidisciplinary rounds held today  Acute hypoxemic respiratory failure  - due to COVID19 induced bilateral multifocal pneumonia -continue ISS with ACHS blood sugar checks - PT/OT is priority at this point - initiate MetaNEB - patient is out of COVID isolation now -D/c alvesco / duonebs - will keep Albuterol only  -stopped systemic steroids - lasix 20 iv daily - auscultation with crackles worse on right compared to previous - total 10L net negative  - bronchopulmonary hygiene with albuterol and incentive spirometer -continue to encourage patient participation -Beta-D-Glucan - <31 - normal  -appreciate hospitalist team collaboration -continue to wean down O2 - currently at 5L/min [place] - O2 goal at >80% is acceptable -wbc count mildly elevated without additional steroids, will restart prophylactic antibiotics for secondary prevention of  bacterial pneumonia and PCP ppx post high dose prolonged systemic steroids.   Moderate protein calorie malnutrition  - Albumin <2.8 , muscular weakness/deconditioning  - nourishment - continue supplement with meals daily   - 12.5 mg Albumin infusion - 25% daily IV    Multiple comorbid conditions -Hx of RCC, Pancreatic CA, thyroid CA, colon CA -hx of liver cirrosis and CKD - predisposing to hypercoagulable state -currently on Lovenox PPX dose 40 [place] daily will increase to bid while acutely ill.    Essential HTN   stopped norvasc, bystolic and ibersartan -to decrease nephro and hepatotoxicity while acutely ill.  Monitoring BP closely , thus far well controlled  -HR and BP have been in reference range on this regimen and patient states she is not anxious.    Chronic diastilic CHF - Grade I  - strict I&Os  - gentle diuresis in lieu of CKD  - fluid restriction to 1200cc/24h  -repeat CXR -reviewed today    Hypothyroidism -continue home synthroid   Chronic Renal Failure -follow chem 7 -follow UO -continue PureWick external urinary Catheter-assess need daily -patient has been on diuretic 20 bid Lasix - strict I&Os-now10L net negative -limit PO fluid intake to 1200cc/24h -stop non-essential nephrotoxic medications - d/c protonix      GI/Nutrition GI PROPHYLAXIS as indicated-stopping PPI  DIET-->TF's as tolerated Constipation protocol as indicated  ENDO - ICU hypoglycemic\Hyperglycemia protocol -check FSBS per protocol - currently q4h    ELECTROLYTES -follow labs only as needed -replace as needed -pharmacy consultation   DVT/GI PRX  ordered -SCDs  TRANSFUSIONS AS NEEDED MONITOR FSBS ASSESS the need for LABS as needed     This document was prepared using Dragon voice recognition software and may include unintentional dictation errors.    Ottie Glazier, M.D.  Division of [place]

## 2019-04-03 NOTE — Progress Notes (Signed)
Nutrition Follow-up  RD working remotely.  DOCUMENTATION CODES:   Obesity unspecified  INTERVENTION:  Will discontinue Nepro and Pro-Stat as patient reports she cannot tolerate them due to taste.   Provide Ensure Max Protein po BID, each supplement provides 150 kcal and 30 grams of protein. Patient prefers chocolate.  Encouraged adequate intake of protein at each meal.  NUTRITION DIAGNOSIS:   Increased nutrient needs related to catabolic illness(Cancer, cirrhosis, COVID 19) as evidenced by estimated needs.  Ongoing.  GOAL:   Patient will meet greater than or equal to 90% of their needs  Progressing.  MONITOR:   PO intake, Supplement acceptance, Labs, Weight trends, Skin, I & O's  REASON FOR ASSESSMENT:   Consult Assessment of nutrition requirement/status  ASSESSMENT:   74 y.o. 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 Her appetite is improving but her intake is still variable. Some meals she only eats 50% of her meals while some meals she can eat closer to 100%. She reports she is eating protein at every meal. She is not able to tolerate her current oral nutrition supplements due to taste. She is willing to try a different supplement to see if she tolerates it better.  Medications reviewed and include: vitamin D3 1000 units daily, Lasix 20 mg daily IV, Novolog 0-9 units TID, Novolog 0-5 units QHS, Novolog 4 units TID, Levemir 15 units QHS, levothyroxine, human albumin 12.5 grams daily IV.  Labs reviewed: CBG 61-189, BUN 41, Creatinine 1.31. Potassium and phosphorus WNL.  Diet Order:   Diet Order            Diet Carb Modified Fluid consistency: Thin; Room service  appropriate? Yes  Diet effective now             EDUCATION NEEDS:   Education needs have been addressed  Skin:  Skin Assessment: Reviewed RN Assessment  Last BM:  04/02/2019  Height:   Ht Readings from Last 1 Encounters:  03/19/19 5\' 5"  (1.651 m)   Weight:   Wt Readings from Last 1 Encounters:  03/19/19 98.3 kg   Ideal Body Weight:  56.8 kg  BMI:  Body mass index is 36.06 kg/m.  Estimated Nutritional Needs:   Kcal:  2000-2300kcal/day  Protein:  100-115g/day  Fluid:  1.4-1.7L/day  Jacklynn Barnacle, MS, RD, LDN Pager number available on Amion

## 2019-04-03 NOTE — Progress Notes (Signed)
Patient ID: Rebecca Kirby, female   DOB: 12-Mar-1945, 74 y.o.   MRN: [id number] [facility] PROGRESS NOTE  KINBERLY PENALVER [id number] DOB: [birth year] DOA: 03/19/2019 PCP: Idelle Crouch, MD  HPI/Subjective: Patient switched over from high flow nasal cannula to regular nasal cannula.  Was on 5 L nasal cannula when I saw her this morning.  She does not complain of any shortness of breath or wheezing.  Still little bit weak.  Objective: Vitals:   04/03/19 0122 04/03/19 0739  BP:  (!) 113/53  Pulse: 81 72  Resp:  16  Temp:  98.3 F (36.8 C)  SpO2: (!) 84% 91%    Intake/Output Summary (Last 24 hours) at 04/03/2019 1430 Last data filed at 04/03/2019 1300 Gross per 24 hour  Intake 328.92 ml  Output 150 ml  Net 178.92 ml   Filed Weights   03/19/19 1700  Weight: 98.3 kg    ROS: Review of Systems  Constitutional: Positive for malaise/fatigue. Negative for chills and fever.  Eyes: Negative for blurred vision.  Respiratory: Negative for cough and shortness of breath.   Cardiovascular: Negative for chest pain.  Gastrointestinal: Negative for abdominal pain, constipation, diarrhea, nausea and vomiting.  Genitourinary: Negative for dysuria.  Musculoskeletal: Negative for joint pain.  Neurological: Negative for dizziness and headaches.   Exam: Physical Exam  Constitutional: She is oriented to person, place, and time.  HENT:  Nose: No mucosal edema.  Mouth/Throat: No oropharyngeal exudate or posterior oropharyngeal edema.  Eyes: Pupils are equal, round, and reactive to light. Conjunctivae and lids are normal.  Neck: Carotid bruit is not present.  Cardiovascular: S1 normal and S2 normal. Exam reveals no gallop.  No murmur heard. Respiratory: No respiratory distress. She has decreased breath sounds in the right lower field and the left lower field. She has no wheezes. She has no rhonchi. She has rales in the right lower field and the left lower field.  GI: Soft. Bowel sounds  are normal. There is no abdominal tenderness.  Musculoskeletal:     Right ankle: No swelling.     Left ankle: No swelling.  Lymphadenopathy:    She has no cervical adenopathy.  Neurological: She is alert and oriented to person, place, and time. No cranial nerve deficit.  Skin: Skin is warm. No rash noted. Nails show no clubbing.  Psychiatric: She has a normal mood and affect.      Data Reviewed: Basic Metabolic Panel: Recent Labs  Lab 03/28/19 0447 03/28/19 [id number] 03/29/19 0411 03/29/19 0411 03/30/19 0518 03/31/19 0225 04/01/19 [id number] 04/02/19 0315 04/03/19 0351  NA 136   < > 133*   < > 135 136 132* 132* 136  K 4.9   < > 4.7   < > 5.1 4.3 4.6 4.1 4.1  CL 99   < > 98   < > 98 99 96* 97* 99  CO2 25   < > 24   < > 30 27 29 28 26   GLUCOSE 237*   < > 235*   < > 226* 66* 190* 160* 97  BUN 81*   < > 74*   < > 68* 70* 58* 43* 41*  CREATININE 1.39*   < > 1.12*   < > 1.19* 1.18* 1.31* 1.15* 1.31*  CALCIUM 8.6*   < > 8.4*   < > 8.6* 9.0 8.7* 8.6* 8.7*  MG 2.3  --   --   --  2.2  --  1.9 1.8  --  PHOS 6.5*   < > 5.3*  --  5.2*  5.6* 4.3 5.2* 3.5  --    < > = values in this interval not displayed.   Liver Function Tests: Recent Labs  Lab 03/30/19 0518 03/31/19 0225 04/02/19 0315  ALBUMIN 2.3* 2.3* 2.1*   CBC: Recent Labs  Lab 03/28/19 0447 03/29/19 0411 03/30/19 0518 04/01/19 0554  WBC 14.0* 15.7* 13.5* 15.2*  NEUTROABS 13.0* 14.7*  --  13.9*  HGB 9.5* 9.3* 9.3* 9.2*  HCT 29.5* 28.5* 28.8* 28.8*  MCV 82.4 82.8 83.2 83.0  PLT 234 221 200 136*   BNP (last 3 results) Recent Labs    03/19/19 1218  BNP 139.0*    CBG: Recent Labs  Lab 04/02/19 1709 04/02/19 2141 04/03/19 0737 04/03/19 0927 04/03/19 1147  GLUCAP 130* 189* 61* 154* 147*     Studies: DG Chest Port 1 View  Result Date: 04/03/2019 CLINICAL DATA:  COVID-19 pneumonia. EXAM: PORTABLE CHEST 1 VIEW COMPARISON:  Single-view of the chest 04/01/2019 and 03/25/2019. FINDINGS: Extensive bilateral airspace  disease is worse on the left. Aeration in the right lower lung zone has mildly improved compared to the most recent exam. Heart size is upper normal. No pneumothorax or pleural effusion. IMPRESSION: Left worse than right airspace disease persists. Aeration in the right lower lung zone has mildly improved since the most recent exam. Electronically Signed   By: Inge Rise M.D.   On: 04/03/2019 14:03    Scheduled Meds: . Chlorhexidine Gluconate Cloth  6 each Topical Daily  . cholecalciferol  1,000 Units Oral Daily  . citalopram  40 mg Oral Daily  . enoxaparin (LOVENOX) injection  40 mg Subcutaneous Q24H  . fluticasone  2 spray Each Nare Daily  . furosemide  20 mg Intravenous Daily  . insulin aspart  0-5 Units Subcutaneous QHS  . insulin aspart  0-9 Units Subcutaneous TID WC  . insulin aspart  4 Units Subcutaneous TID WC  . insulin detemir  15 Units Subcutaneous QHS  . levothyroxine  175 mcg Oral Q0600  . nystatin  5 mL Oral QID  . Ensure Max Protein  11 oz Oral BID BM  . sulfamethoxazole-trimethoprim  1 tablet Oral Q12H   Continuous Infusions: . albumin human 12.5 g (04/03/19 0958)    Assessment/Plan:  1. Acute hypoxic respiratory failure secondary to COVID-19 infection and severe ARDS and pneumonia.  Currently on 5 L of regular nasal cannula.  Would like to see the patient stable on nasal cannula prior to discharge home.  Hopefully can go home over the weekend.  Still trying to get the oxygen down as much as possible prior to disposition.  Patient received remdesivir and steroids during the hospital course.  Pulmonary added IV Lasix to see if we can get oxygenation better. 2. Weakness.  Continued physical therapy evaluation.  Patient has 5 steps in order to get into her daughter's house. 3. Acute kidney injury on chronic kidney disease stage IIIb.  Creatinine with the IV Lasix a little higher at 1.31. 4. History of metastatic renal cell carcinoma 5. Type 2 diabetes mellitus, with  hemoglobin A1c of 7.4.  Patient on 20 units of detemir insulin twice a day with short acting insulin. 6. Hypothyroidism unspecified on levothyroxine 7. History of thrush on nystatin swish and swallow   Code Status:     Code Status Orders  (From admission, onward)         Start     Ordered  03/19/19 1526  Full code  Continuous     03/19/19 1525        Code Status History    Date Active Date Inactive Code Status Order ID Comments User Context   08/09/2014 1247 08/12/2014 1353 Full Code [id number]  Dereck Leep, MD Inpatient   Advance Care Planning Activity    Advance Directive Documentation     Most Recent Value  Type of Advance Directive  Healthcare Power of [place]  Pre-existing out of facility DNR order (yellow form or pink MOST form)  --  "MOST" Form in Place?  --     Family Communication: Spoke with daughter on the phone Disposition Plan: Potential discharge home with home health over the weekend if respiratory status remains stable on regular nasal cannula.  Still trying to get down to 3 or 4 L nasal cannula.  Consultants:  Pulmonary  Time spent: 27 minutes  [place]

## 2019-04-03 NOTE — Progress Notes (Signed)
Physical Therapy Treatment Patient Details Name: Rebecca Kirby MRN: [id number] DOB: 03-Mar-1945 Today's Date: 04/03/2019    History of Present Illness 74 year old female with a history of chronic anemia, anxiety disorder, history of colon CA, GERD, hypothyroidism renal cell carcinoma history of thyroid CA, pancreatic CA status post Whipple's procedure, diabetes, B TKA, hernia came into the ED due to worsening hypoxemia post COVID-19 infection.   MD assessment includes acute hypoxemic respiratory failure due to COVID19 induced bilateral multifocal pneumonia.    PT Comments    Pt was long sitting in bed upon arriving. She agrees to PT session and is cooperative and highly motivated. " I just want to get better to go home." She was on 5 L o2 upon arriving with sao2 94%. Therapist lowered to 4 L during bed mobility however pt de-sats to 86 with just sitting up in bed. Returned to 5 L O2 x 2 minutes resting prior to O2 returning to 90s. Pt requested to use BR and transferred from EOB to [facility] with only CGA for safety. She de-sats to 84 %on 5 L with stand step/pivot to [facility] and required 5 minutes to return to 90%. Once o2 recovered pt ambulated a total of 10 ft with RW on 6 L o2. She demnonstrates safe steady gait however de-sats to 74 % and required 4 minutes to recover to 88%. Pt very fatigued after ambulation 10 ft. Pt unsafe to trial stair training requested by pt's daughter/CM. Will progress as able. Pt continues to plan to d/c to home and reports that she may d/c this weekend. PT recommends w/c at this time 2/2 to endurance deficits and inability to tolerate activity at this time. PT will attempt to see pt over weekend and  increase activity per pt tolerance.    Follow Up Recommendations  Home health PT;Supervision for mobility/OOB     Equipment Recommendations  Rolling walker with 5" wheels;3in1 (PT);Wheelchair (measurements PT)    Recommendations for Other Services       Precautions /  Restrictions Precautions Precautions: Fall Precaution Comments: watch O2 sats(on 5 L upon arriving) Restrictions Weight Bearing Restrictions: No Other Position/Activity Restrictions: Per MD notes, OK to participate with PT/OT with SpO2 >80%.    Mobility  Bed Mobility Overal bed mobility: Modified Independent             General bed mobility comments: pt was on 5 L upon arriving with sao2 94%. Therapist decreased to 4 L during supine to sit EOB and O2 decreased to 86%. Increased back to 5 L. pt required to sit EOB x 2 minutes prior to O2 increasing back to 90s  Transfers Overall transfer level: Needs assistance Equipment used: Rolling walker (2 wheeled) Transfers: Sit to/from Stand Sit to Stand: Min guard Stand pivot transfers: Min guard       General transfer comment: Pt was able to stand EOB and take 3 steps to turn to [facility]. she was on 5 L during transfer and pt desats to 84 %. pt use BSC and sat with focus on breathing techniques and recovers to 90 % with sitting x 5 minutes. Pt also was able to stand from [facility] with CGA for safety.   Ambulation/Gait Ambulation/Gait assistance: Min guard Gait Distance (Feet): 10 Feet Assistive device: Rolling walker (2 wheeled) Gait Pattern/deviations: WFL(Within Functional Limits) Gait velocity: decreased   General Gait Details: Pt was increased to 6 L to trial gait. Pt requested to trial ambulation to doorway from EOB ~ 5 ft  each way (10 total) She desats to 74 percent on 6 L and took 4 minutes to recover to 88%   Chief Strategy Officer    Modified Rankin (Stroke Patients Only)       Balance Overall balance assessment: Needs assistance Sitting-balance support: Feet supported Sitting balance-Leahy Scale: Good Sitting balance - Comments: pt sat EOB x 12 minutes total throughout session without LOB.   Standing balance support: Bilateral upper extremity supported;During functional activity Standing  balance-Leahy Scale: Good Standing balance comment: Pt without LOB with standing activity with BUE support on RW.                            Cognition Arousal/Alertness: Awake/alert Behavior During Therapy: WFL for tasks assessed/performed Overall Cognitive Status: Within Functional Limits for tasks assessed                                 General Comments: Pt A and O x 4      Exercises Other Exercises Other Exercises: Additional instruction in PLB and why she needs to utilize more frequently; pt required Mod-Max verbal cues to utilize, pt endorses being a "mouth breather"    General Comments General comments (skin integrity, edema, etc.): Once on BSC, O2 initially down briefly to 78% on 7L, HR 112. Within 5 min for recovery and Mod VC for PLB, Pt able to recover to 88%, HR 93 on 6L. Once back in bed on 5L, 88%, HR 87. Pt denied SOB throughout.      Pertinent Vitals/Pain Pain Assessment: No/denies pain    Home Living Family/patient expects to be discharged to:: Private residence Living Arrangements: Alone Available Help at Discharge: Family;Available 24 hours/day Type of Home: Mobile home Home Access: Stairs to enter Entrance Stairs-Rails: Right;Left;Can reach both Home Layout: One level Home Equipment: Clinical cytogeneticist - 2 wheels;Bedside commode;Transport chair;Grab bars - tub/shower;Hand held shower head      Prior Function Level of Independence: Independent      Comments: Pt Ind with amb community distances without an AD, no fall history, Ind with ADLs   PT Goals (current goals can now be found in the care plan section) Acute Rehab PT Goals Patient Stated Goal: " I want to go home" Progress towards PT goals: Progressing toward goals(slow progress 2/2 to respiratory deficits)    Frequency    Min 2X/week      PT Plan Current plan remains appropriate    Co-evaluation     PT goals addressed during session: Mobility/safety with  mobility;Strengthening/ROM        AM-PAC PT "6 Clicks" Mobility   Outcome Measure  Help needed turning from your back to your side while in a flat bed without using bedrails?: A Little Help needed moving from lying on your back to sitting on the side of a flat bed without using bedrails?: A Little Help needed moving to and from a bed to a chair (including a wheelchair)?: A Little Help needed standing up from a chair using your arms (e.g., wheelchair or bedside chair)?: A Little Help needed to walk in hospital room?: A Little Help needed climbing 3-5 steps with a railing? : A Lot 6 Click Score: 17    End of Session Equipment Utilized During Treatment: Oxygen Activity Tolerance: Patient limited by fatigue Patient left: in  bed;with call bell/phone within reach;with bed alarm set;with nursing/sitter in room Nurse Communication: Mobility status PT Visit Diagnosis: Difficulty in walking, not elsewhere classified (R26.2);Muscle weakness (generalized) (M62.81)     Time: [id number] PT Time Calculation (min) (ACUTE ONLY): 23 min  Charges:  $Therapeutic Activity: 23-37 mins                     Julaine Fusi PTA 04/03/19, 3:50 PM

## 2019-04-03 NOTE — Evaluation (Signed)
Occupational Therapy Re-Evaluation Patient Details Name: Rebecca Kirby MRN: [id number] DOB: 1945/05/27 Today's Date: 04/03/2019    History of Present Illness 74 year old female with a history of chronic anemia, anxiety disorder, history of colon CA, GERD, hypothyroidism renal cell carcinoma history of thyroid CA, pancreatic CA status post Whipple's procedure, diabetes, B TKA, hernia came into the ED due to worsening hypoxemia post COVID-19 infection.   MD assessment includes acute hypoxemic respiratory failure due to COVID19 induced bilateral multifocal pneumonia.   Clinical Impression   Pt seen for OT re-evaluation and tx this date. Pt eager to get up. On 5L O2 at start of session, 89-90% O2. Pt performed bed mobility with additional time/effort, requiring >5 min to recover for O2 sats to improve from 81%, HR 93, back up to 87%/88% on 5L while sitting EOB. Pt had tendency to revert back to mouth breathing and O2 sats dropped to 85%/86% requiring verbal cues to utilize pursed lip breathing (PLB) to help her O2 sats improve. After extended rest break EOB and O2 increased to 7L for mobility, pt performed STS transfer requiring bed elevated and Min A to complete and CGA with RW to amb/turn to sit on BSC next to bed. HR up to 112, O2 sats down to 81% on 7L with the effort. Able to recover on 7L within 2 minutes using PLB and O2 decreased to 6L. CGA for STS transfer with RW from [facility] and CGA back to bed. Pt left back down on 5L O2, 88% O2, 87 HR once supine in bed. Pt required increased assist for initial transfer from EOB than previous sessions. Pt endorsed her legs feel weaker. Pt continues to be very motivated to return home. Continues to require extensive rest breaks to support breath and O2 recovery with exertional activity. Continue to recommend HHOT pending further progress and ability to demonstrate pt able to maintain O2 sats with activity.    Follow Up Recommendations  Home health OT(pending  further progress for O2 sats)    Equipment Recommendations  None recommended by OT    Recommendations for Other Services       Precautions / Restrictions Precautions Precaution Comments: watch O2 sats Restrictions Weight Bearing Restrictions: No Other Position/Activity Restrictions: Per MD notes, OK to participate with PT/OT with SpO2 >80%.      Mobility Bed Mobility Overal bed mobility: Modified Independent             General bed mobility comments: increased time/effort; on 5L O2, once EOB, O2 85%, HR 94. With 36min rest and VC for PLB, O2 87%, HR 91. Scooting EOB requiring 6L O2 to support recovery prior to transfer attempts, up to 7L just prior to transfer  Transfers Overall transfer level: Needs assistance Equipment used: Rolling walker (2 wheeled) Transfers: Sit to/from Stand Sit to Stand: Min assist;From elevated surface         General transfer comment: increased difficulty this date with STS transfer, requiring increased O2 (7L) and Min A to perform with bed elevated.    Balance Overall balance assessment: Needs assistance Sitting-balance support: Feet supported Sitting balance-Leahy Scale: Good     Standing balance support: Bilateral upper extremity supported;During functional activity Standing balance-Leahy Scale: Fair                             ADL either performed or assessed with clinical judgement   ADL Overall ADL's : Needs assistance/impaired  Toilet Transfer: Min guard;Ambulation;RW;BSC Toilet Transfer Details (indicate cue type and reason): Mod VC for PLB, hand placement Toileting- Clothing Manipulation and Hygiene: Set up;Min guard;Sit to/from stand               Vision Baseline Vision/History: Wears glasses Wears Glasses: At all times Patient Visual Report: No change from baseline       Perception     Praxis      Pertinent Vitals/Pain Pain Assessment: No/denies pain     Hand  Dominance Right   Extremity/Trunk Assessment Upper Extremity Assessment Upper Extremity Assessment: Generalized weakness   Lower Extremity Assessment Lower Extremity Assessment: Generalized weakness   Cervical / Trunk Assessment Cervical / Trunk Assessment: Normal   Communication Communication Communication: No difficulties   Cognition Arousal/Alertness: Awake/alert Behavior During Therapy: WFL for tasks assessed/performed Overall Cognitive Status: Within Functional Limits for tasks assessed                                     General Comments  Once on BSC, O2 initially down briefly to 78% on 7L, HR 112. Within 5 min for recovery and Mod VC for PLB, Pt able to recover to 88%, HR 93 on 6L. Once back in bed on 5L, 88%, HR 87. Pt denied SOB throughout.    Exercises Other Exercises Other Exercises: Additional instruction in PLB and why she needs to utilize more frequently; pt required Mod-Max verbal cues to utilize, pt endorses being a "mouth breather"   Shoulder Instructions      Home Living Family/patient expects to be discharged to:: Private residence Living Arrangements: Alone Available Help at Discharge: Family;Available 24 hours/day Type of Home: Mobile home Home Access: Stairs to enter Entrance Stairs-Number of Steps: 3 Entrance Stairs-Rails: Right;Left;Can reach both Home Layout: One level     Bathroom Shower/Tub: Tub/shower unit;Walk-in shower         Home Equipment: Clinical cytogeneticist - 2 wheels;Bedside commode;Transport chair;Grab bars - tub/shower;Hand held shower head          Prior Functioning/Environment Level of Independence: Independent        Comments: Pt Ind with amb community distances without an AD, no fall history, Ind with ADLs        OT Problem List: Decreased activity tolerance;Decreased strength;Cardiopulmonary status limiting activity      OT Treatment/Interventions: Self-care/ADL training;Therapeutic  exercise;Therapeutic activities;Energy conservation;DME and/or AE instruction;Patient/family education    OT Goals(Current goals can be found in the care plan section) Acute Rehab OT Goals Patient Stated Goal: To get stronger and return home OT Goal Formulation: With patient Time For Goal Achievement: 04/17/19 Potential to Achieve Goals: Good ADL Goals Additional ADL Goal #3: Pt will utilize pursed lip breathing to support breath recovery with exertional ADL/mobility tasks; requiring Min VC to use.  OT Frequency: Min 1X/week   Barriers to D/C:            Co-evaluation              AM-PAC OT "6 Clicks" Daily Activity     Outcome Measure Help from another person eating meals?: None Help from another person taking care of personal grooming?: None Help from another person toileting, which includes using toliet, bedpan, or urinal?: A Little Help from another person bathing (including washing, rinsing, drying)?: A Little Help from another person to put on and taking off regular upper body clothing?: None Help from another  person to put on and taking off regular lower body clothing?: A Little 6 Click Score: 21   End of Session Equipment Utilized During Treatment: Rolling walker;Oxygen Nurse Communication: Other (comment)(IV complete)  Activity Tolerance: Patient tolerated treatment well Patient left: in bed;with call bell/phone within reach;with bed alarm set  OT Visit Diagnosis: Other abnormalities of gait and mobility (R26.89);Muscle weakness (generalized) (M62.81)                Time: 1010-1048 OT Time Calculation (min): 38 min Charges:  OT General Charges $OT Visit: 1 Visit OT Evaluation $OT Re-eval: 1 Re-eval OT Treatments $Self Care/Home Management : 8-22 mins $Therapeutic Activity: 23-37 mins  Jeni Salles, MPH, MS, OTR/L ascom [phone] 04/03/19, 2:26 PM

## 2019-04-04 DIAGNOSIS — N1832 Chronic kidney disease, stage 3b: Secondary | ICD-10-CM

## 2019-04-04 DIAGNOSIS — E1121 Type 2 diabetes mellitus with diabetic nephropathy: Secondary | ICD-10-CM

## 2019-04-04 LAB — BASIC METABOLIC PANEL
Anion gap: 9 (ref 5–15)
BUN: 37 mg/dL — ABNORMAL HIGH (ref 8–23)
CO2: 25 mmol/L (ref 22–32)
Calcium: 8.2 mg/dL — ABNORMAL LOW (ref 8.9–10.3)
Chloride: 99 mmol/L (ref 98–111)
Creatinine, Ser: 1.33 mg/dL — ABNORMAL HIGH (ref 0.44–1.00)
GFR calc Af Amer: 45 mL/min — ABNORMAL LOW (ref >60)
GFR calc non Af Amer: 39 mL/min — ABNORMAL LOW (ref >60)
Glucose, Bld: 176 mg/dL — ABNORMAL HIGH (ref 70–99)
Potassium: 4.2 mmol/L (ref 3.5–5.1)
Sodium: 133 mmol/L — ABNORMAL LOW (ref 135–145)

## 2019-04-04 LAB — GLUCOSE, CAPILLARY
Glucose-Capillary: 159 mg/dL — ABNORMAL HIGH (ref 70–99)
Glucose-Capillary: 244 mg/dL — ABNORMAL HIGH (ref 70–99)
Glucose-Capillary: 259 mg/dL — ABNORMAL HIGH (ref 70–99)
Glucose-Capillary: 236 mg/dL — ABNORMAL HIGH (ref 70–99)
Glucose-Capillary: 254 mg/dL — ABNORMAL HIGH (ref 70–99)

## 2019-04-04 MED ORDER — INSULIN DETEMIR 100 UNIT/ML ~~LOC~~ SOLN
20.0000 [IU] | Freq: Every day | SUBCUTANEOUS | Status: DC
  Administered 2019-04-04 – 2019-04-06 (×3): 20 [IU] via SUBCUTANEOUS
  Filled 2019-04-04 (×4): qty 0.2

## 2019-04-04 NOTE — Progress Notes (Signed)
PULMONARY PROGRESS NOTE    Name: Rebecca Kirby MRN: [id number] DOB: 08/19/1945     LOS: 64   SUBJECTIVE FINDINGS & SIGNIFICANT EVENTS   Patient description:   This is a very pleasant 74 year old female with complex comorbid history s/p COVID 19 pneumonitis with acute hypoxemic respiratory failure   Lines / Drains: PIVx2  Cultures / Sepsis markers: Respiratory culture -in progress MRSA PCT - negative Blood culture x2 - NTDx24h -reincubation in progress  Antimicrobials: zithromax 500 PO daily -finished CAP regimen Vecluri x 5 days per protocol- finished Ivermectin 9mg  po daily- d/cd 6 days ago  Protocols / Consultants: PCCM Hospitalist  Tests / Events: [place] - Lower extermities - negative for DVT bilaterally  04/01/19- transferred to medical floor from SDU/MICU 04/02/19 - patient on 5L/min [place] she lucid, able to take 442ml tidal volumes on incentive spirometer, good hand strength and ROMx4, was having breakfast without assistance during my evaluation in am, lung auscultation with mild crackles worse on right. Care plan discussed with daughter at bedside.   04/03/19 -case discussed with PT plan for hallway exertional walk today, discussed with primary attending will plan for possible d/c home tommorow if continues to improve today with decreased O2 req.  Discusssed with [employer] - O2 concentrator ready for home d/c.  Reviewed CXR today -as per radiologist improved from previous.  04/04/19- Patient appears clinically improved, she had BM well formed, she is on 4L [place], she speaks in full sentences sat up and got out of bed without assistance , walked to bathroom by herself with walker  PAST MEDICAL HISTORY   Past Medical History:  Diagnosis Date  . Anemia   . Anxiety   . Colon cancer ([place])   . Diabetes  mellitus without complication ([place])   . Family history of adverse reaction to anesthesia    daughter had reaction to anesthesia and was in icu  . GERD (gastroesophageal reflux disease)   . Hypertension   . Hypothyroidism   . Kidney cancer, primary, with metastasis from kidney to other site [facility]) 08/26/2014   right  . Pancreas cancer ([place]) 2018  . Thyroid cancer [facility])      SURGICAL HISTORY   Past Surgical History:  Procedure Laterality Date  . CHOLECYSTECTOMY    . COLON RESECTION    . COLONOSCOPY WITH PROPOFOL N/A 09/01/2015   Procedure: COLONOSCOPY WITH PROPOFOL;  Surgeon: Manya Silvas, MD;  Location: [facility] ENDOSCOPY;  Service: Endoscopy;  Laterality: N/A;  . CYSTOSCOPY  12/28/2016   Procedure: CYSTOSCOPY;  Surgeon: Ward, Honor Loh, MD;  Location: [facility] ORS;  Service: Gynecology;;  . ESOPHAGOGASTRODUODENOSCOPY (EGD) WITH PROPOFOL N/A 09/01/2015   Procedure: ESOPHAGOGASTRODUODENOSCOPY (EGD) WITH PROPOFOL;  Surgeon: Manya Silvas, MD;  Location: [facility] ENDOSCOPY;  Service: Endoscopy;  Laterality: N/A;  . EUS N/A 06/28/2016   Procedure: FULL UPPER ENDOSCOPIC ULTRASOUND (EUS) RADIAL;  Surgeon: Jola Schmidt, MD;  Location: [facility] ENDOSCOPY;  Service: Endoscopy;  Laterality: N/A;  . EXCISION MASS NECK N/A 09/20/2015   Procedure: EXCISION MASS NECK;  Surgeon: Beverly Gust, MD;  Location: [place];  Service: ENT;  Laterality: N/A;  . LAPAROSCOPIC BILATERAL SALPINGO OOPHERECTOMY Bilateral 12/28/2016   Procedure: LAPAROSCOPIC BILATERAL SALPINGO OOPHORECTOMY;  Surgeon: Ward, Honor Loh, MD;  Location: [facility] ORS;  Service: Gynecology;  Laterality: Bilateral;  . LAPAROSCOPIC HYSTERECTOMY N/A 12/28/2016   Procedure: HYSTERECTOMY TOTAL LAPAROSCOPIC;  Surgeon: Ward, Honor Loh, MD;  Location: [facility] ORS;  Service: Gynecology;  Laterality: N/A;  . LAPAROSCOPIC  LYSIS OF ADHESIONS  12/28/2016   Procedure: LAPAROSCOPIC EXTENSIVE LYSIS OF ADHESIONS;  Surgeon: Ward, Honor Loh, MD;  Location: [facility] ORS;   Service: Gynecology;;  . rt kidney removed Right   . THYROGLOSSAL DUCT CYST Right 09/20/2015   Procedure: excision sub mandibular gland right;  Surgeon: Beverly Gust, MD;  Location: [place];  Service: ENT;  Laterality: Right;  . THYROID SURGERY Bilateral   . TOTAL KNEE ARTHROPLASTY Right   . TOTAL KNEE ARTHROPLASTY Left 08/09/2014   Procedure: TOTAL KNEE ARTHROPLASTY;  Surgeon: Dereck Leep, MD;  Location: [facility] ORS;  Service: Orthopedics;  Laterality: Left;     FAMILY HISTORY   Family History  Problem Relation Age of Onset  . Breast cancer Maternal Aunt   . Breast cancer Cousin      SOCIAL HISTORY   Social History   Tobacco Use  . Smoking status: Former Smoker    Quit date: 07/29/1994    Years since quitting: 24.6  . Smokeless tobacco: Never Used  Substance Use Topics  . Alcohol use: No  . Drug use: No     MEDICATIONS   Current Medication:  Current Facility-Administered Medications:  .  acetaminophen (TYLENOL) tablet 650 mg, 650 mg, Oral, Q6H PRN, Jennye Boroughs, MD, 325 mg at 04/01/19 1951 .  albumin human 25 % solution 12.5 g, 12.5 g, Intravenous, Daily, Lanney Gins, Axcel Horsch, MD, Last Rate: 60 mL/hr at 04/04/19 0959, 12.5 g at 04/04/19 0959 .  albuterol (VENTOLIN HFA) 108 (90 Base) MCG/ACT inhaler 2 puff, 2 puff, Inhalation, Q6H PRN, Flora Lipps, MD, 2 puff at 03/26/19 0541 .  Chlorhexidine Gluconate Cloth 2 % PADS 6 each, 6 each, Topical, Daily, Ottie Glazier, MD, 6 each at 04/04/19 [phone] .  cholecalciferol (VITAMIN D3) tablet 1,000 Units, 1,000 Units, Oral, Daily, Jennye Boroughs, MD, 1,000 Units at 04/04/19 0755 .  citalopram (CELEXA) tablet 40 mg, 40 mg, Oral, Daily, Jennye Boroughs, MD, 40 mg at 04/04/19 0755 .  enoxaparin (LOVENOX) injection 40 mg, 40 mg, Subcutaneous, Q24H, Jennye Boroughs, MD, 40 mg at 04/03/19 2156 .  fluticasone (FLONASE) 50 MCG/ACT nasal spray 2 spray, 2 spray, Each Nare, Daily, Jennye Boroughs, MD, 2 spray at 04/04/19 0756 .   furosemide (LASIX) injection 20 mg, 20 mg, Intravenous, Daily, Ottie Glazier, MD, 20 mg at 04/04/19 0754 .  insulin aspart (novoLOG) injection 0-5 Units, 0-5 Units, Subcutaneous, QHS, Awilda Bill, NP, 3 Units at 04/03/19 2155 .  insulin aspart (novoLOG) injection 0-9 Units, 0-9 Units, Subcutaneous, TID WC, Dallie Piles, RPH, 2 Units at 04/04/19 0757 .  insulin aspart (novoLOG) injection 4 Units, 4 Units, Subcutaneous, TID WC, Dallie Piles, RPH, 4 Units at 04/04/19 0756 .  insulin detemir (LEVEMIR) injection 15 Units, 15 Units, Subcutaneous, QHS, Loletha Grayer, MD, 15 Units at 04/03/19 2155 .  levothyroxine (SYNTHROID) tablet 175 mcg, 175 mcg, Oral, Q0600, Jennye Boroughs, MD, 175 mcg at 04/04/19 0755 .  nystatin (MYCOSTATIN) 100000 UNIT/ML suspension 500,000 Units, 5 mL, Oral, QID, Awilda Bill, NP, 500,000 Units at 04/04/19 0754 .  protein supplement (ENSURE MAX) liquid, 11 oz, Oral, BID BM, Loletha Grayer, MD, 11 oz at 04/04/19 0951 .  sulfamethoxazole-trimethoprim (BACTRIM) 400-80 MG per tablet 1 tablet, 1 tablet, Oral, Q12H, Ottie Glazier, MD, 1 tablet at 04/04/19 [id number]    ALLERGIES   Statins    REVIEW OF SYSTEMS     10 point ROS neg except as per sujective findings  PHYSICAL EXAMINATION   Vital Signs:  Temp:  [98.7 F (37.1 C)-99.5 F (37.5 C)] 98.7 F (37.1 C) (02/06 0732) Pulse Rate:  [81-89] 81 (02/06 0732) Resp:  [16-18] 17 (02/06 0732) BP: (114-128)/(44-52) 114/48 (02/06 0732) SpO2:  [90 %-94 %] 94 % (02/06 0732)  GENERAL:well nourished, NAD HEAD: Normocephalic, atraumatic.  EYES: Pupils equal, round, reactive to light.  No scleral icterus.  MOUTH: Moist mucosal membrane. NECK: Supple. No thyromegaly. No nodules. No JVD.  PULMONARY: decreased breath sounds bilaterally CARDIOVASCULAR: S1 and S2. Regular rate and rhythm. No murmurs, rubs, or gallops.  GASTROINTESTINAL: Soft, nontender, non-distended. No masses. Positive bowel sounds. No  hepatosplenomegaly.  MUSCULOSKELETAL: No swelling, clubbing, or edema.  NEUROLOGIC: Mild distress due to acute illness SKIN:intact,warm,dry   PERTINENT DATA     Infusions: . albumin human 12.5 g (04/04/19 0959)   Scheduled Medications: . Chlorhexidine Gluconate Cloth  6 each Topical Daily  . cholecalciferol  1,000 Units Oral Daily  . citalopram  40 mg Oral Daily  . enoxaparin (LOVENOX) injection  40 mg Subcutaneous Q24H  . fluticasone  2 spray Each Nare Daily  . furosemide  20 mg Intravenous Daily  . insulin aspart  0-5 Units Subcutaneous QHS  . insulin aspart  0-9 Units Subcutaneous TID WC  . insulin aspart  4 Units Subcutaneous TID WC  . insulin detemir  15 Units Subcutaneous QHS  . levothyroxine  175 mcg Oral Q0600  . nystatin  5 mL Oral QID  . Ensure Max Protein  11 oz Oral BID BM  . sulfamethoxazole-trimethoprim  1 tablet Oral Q12H   PRN Medications: acetaminophen, albuterol Hemodynamic parameters:   Intake/Output: 02/05 0701 - 02/06 0700 In: 291.6 [P.O.:250; IV Piggyback:41.6] Out: [id number]  Ventilator  Settings:      LAB RESULTS:  Basic Metabolic Panel: Recent Labs  Lab 03/29/19 0411 03/29/19 0411 03/30/19 0518 03/30/19 0518 03/31/19 0225 03/31/19 0225 04/01/19 [id number] 04/01/19 [id number] 04/02/19 0315 04/02/19 0315 04/03/19 0351 04/04/19 0708  NA 133*   < > 135   < > 136  --  132*  --  132*  --  136 133*  K 4.7   < > 5.1   < > 4.3   < > 4.6   < > 4.1   < > 4.1 4.2  CL 98   < > 98   < > 99  --  96*  --  97*  --  99 99  CO2 24   < > 30   < > 27  --  29  --  28  --  26 25  GLUCOSE 235*   < > 226*   < > 66*  --  190*  --  160*  --  97 176*  BUN 74*   < > 68*   < > 70*  --  58*  --  43*  --  41* 37*  CREATININE 1.12*   < > 1.19*   < > 1.18*  --  1.31*  --  1.15*  --  1.31* 1.33*  CALCIUM 8.4*   < > 8.6*   < > 9.0  --  8.7*  --  8.6*  --  8.7* 8.2*  MG  --   --  2.2  --   --   --  1.9  --  1.8  --   --   --   PHOS 5.3*  --  5.2*  5.6*  --  4.3  --   5.2*  --  3.5  --   --   --    < > =  values in this interval not displayed.   Liver Function Tests: Recent Labs  Lab 03/30/19 0518 03/31/19 0225 04/02/19 0315  ALBUMIN 2.3* 2.3* 2.1*   No results for input(s): LIPASE, AMYLASE in the last 168 hours. No results for input(s): AMMONIA in the last 168 hours. CBC: Recent Labs  Lab 03/29/19 0411 03/30/19 0518 04/01/19 0554  WBC 15.7* 13.5* 15.2*  NEUTROABS 14.7*  --  13.9*  HGB 9.3* 9.3* 9.2*  HCT 28.5* 28.8* 28.8*  MCV 82.8 83.2 83.0  PLT 221 200 136*   Cardiac Enzymes: No results for input(s): CKTOTAL, CKMB, CKMBINDEX, TROPONINI in the last 168 hours. BNP: Invalid input(s): POCBNP CBG: Recent Labs  Lab 04/03/19 0927 04/03/19 1147 04/03/19 1642 04/03/19 2125 04/04/19 0730  GLUCAP 154* 147* 192* 278* 159*     IMAGING RESULTS:  Imaging: DG Chest Port 1 View  Result Date: 04/03/2019 CLINICAL DATA:  COVID-19 pneumonia. EXAM: PORTABLE CHEST 1 VIEW COMPARISON:  Single-view of the chest 04/01/2019 and 03/25/2019. FINDINGS: Extensive bilateral airspace disease is worse on the left. Aeration in the right lower lung zone has mildly improved compared to the most recent exam. Heart size is upper normal. No pneumothorax or pleural effusion. IMPRESSION: Left worse than right airspace disease persists. Aeration in the right lower lung zone has mildly improved since the most recent exam. Electronically Signed   By: Inge Rise M.D.   On: 04/03/2019 14:03             ASSESSMENT AND PLAN    -Multidisciplinary rounds held today  Acute hypoxemic respiratory failure  - due to COVID19 induced bilateral multifocal pneumonia -continue ISS with ACHS blood sugar checks - PT/OT - walked few feet with desaturation yesterday, will do again today , optimizing for d/c home tommorow - initiate MetaNEB - patient is out of COVID isolation now -D/c alvesco / duonebs - will keep Albuterol only  -stopped systemic steroids - lasix 20 iv  daily - auscultation with crackles worse on right compared to previous - total 10L net negative  - bronchopulmonary hygiene with albuterol and incentive spirometer -continue to encourage patient participation -Beta-D-Glucan - <31 - normal  -appreciate hospitalist team collaboration -continue to wean down O2 - currently at 4L/min [place] - O2 goal at >80% is acceptable -wbc count mildly elevated without additional steroids, will restart prophylactic antibiotics for secondary prevention of bacterial pneumonia and PCP ppx post high dose prolonged systemic steroids.   Moderate protein calorie malnutrition  - Albumin <2.8 , muscular weakness/deconditioning  - nourishment - continue supplement with meals daily   - 12.5 mg Albumin infusion - 25% daily IV    Multiple comorbid conditions -Hx of RCC, Pancreatic CA, thyroid CA, colon CA -hx of liver cirrosis and CKD - predisposing to hypercoagulable state -currently on Lovenox PPX dose 40 [place] daily will increase to bid while acutely ill.    Essential HTN   stopped norvasc, bystolic and ibersartan -to decrease nephro and hepatotoxicity while acutely ill.  Monitoring BP closely , thus far well controlled  -HR and BP have been in reference range on this regimen and patient states she is not anxious.    Chronic diastilic CHF - Grade I  - strict I&Os  - gentle diuresis in lieu of CKD  - fluid restriction to 1200cc/24h  -repeat CXR -reviewed today    Hypothyroidism -continue home synthroid   Chronic Renal Failure -follow chem 7 -follow UO -continue PureWick external urinary Catheter-assess need daily -patient has been on  diuretic 20 bid Lasix - strict I&Os-now10L net negative -limit PO fluid intake to 1200cc/24h -stop non-essential nephrotoxic medications - d/c protonix      GI/Nutrition GI PROPHYLAXIS as indicated-stopping PPI  DIET-->TF's as tolerated Constipation protocol as indicated  ENDO - ICU hypoglycemic\Hyperglycemia  protocol -check FSBS per protocol - currently q4h    ELECTROLYTES -follow labs only as needed -replace as needed -pharmacy consultation   DVT/GI PRX ordered -SCDs  TRANSFUSIONS AS NEEDED MONITOR FSBS ASSESS the need for LABS as needed     This document was prepared using Dragon voice recognition software and may include unintentional dictation errors.    Ottie Glazier, M.D.  Division of [place]

## 2019-04-04 NOTE — Progress Notes (Signed)
Physical Therapy Treatment Patient Details Name: Rebecca Kirby MRN: [id number] DOB: 12-Jan-1946 Today's Date: 04/04/2019    History of Present Illness 74 year old female with a history of chronic anemia, anxiety disorder, history of colon CA, GERD, hypothyroidism renal cell carcinoma history of thyroid CA, pancreatic CA status post Whipple's procedure, diabetes, B TKA, hernia came into the ED due to worsening hypoxemia post COVID-19 infection.   MD assessment includes acute hypoxemic respiratory failure due to COVID19 induced bilateral multifocal pneumonia.    PT Comments    Pt was asleep supine in bed upon arriving. She easily awakes and agrees to PT session. Pt was currently on 4 L o2 with sao2 91%. RT in room and recommended pt perform PT on HF O2 9L. She agrees to OOB activity and is highly motivated throughout session. Extremely pleasant but frustrated she fatigues so quickly. Pt was able to exit R side of bed with MOD I + increased time however sao2 decreased to 84% with just sitting up. Sat x 2 minutes and O2 in 90%. She stood and ambulated ~ 4 steps fwd and backwards with RW for safety and O2 dropped to 74% on HF. With 4 minute seated rest, pt recovers to 88%. Repeated standing and ambulating 3 x with desat each trial. Pt very fatigued with minimal activity. prolonge rest between each gait trial. Unsafe to trial stairs 2/2 to fatigue. HR elevated from 95bpm to 128bpm with standing and take only a few steps. Pt remained seated up in recliner post session with BLEs elevated and RT returning to assist pt. Pt continues to participate with PT but is limited by fatigue/ medical response to minimal activity. PT will continue to follow per POC.    Follow Up Recommendations  Home health PT;Supervision for mobility/OOB     Equipment Recommendations  Rolling walker with 5" wheels;3in1 (PT);Wheelchair (measurements PT)    Recommendations for Other Services       Precautions / Restrictions  Precautions Precautions: Fall Precaution Comments: watch O2 sats Restrictions Weight Bearing Restrictions: No    Mobility  Bed Mobility Overal bed mobility: Modified Independent             General bed mobility comments: Pt was on 4 L o2 upon arriving. RT in room and recommended pt use high flow while participating in PT. Pt placed on HF 9L throughout session per RT recommendation. Pt still de-sats. she was able to exit R side of bed without assistance  Transfers Overall transfer level: Modified independent Equipment used: Rolling walker (2 wheeled) Transfers: Sit to/from Stand Sit to Stand: Modified independent (Device/Increase time) Stand pivot transfers: Supervision       General transfer comment: Pt was able to stand/sit with MOD I. She does however continue to fatigue extremely quickly and de-sats to low 80s with transfers only to recliner.  Ambulation/Gait Ambulation/Gait assistance: Supervision Gait Distance (Feet): 5 Feet(limited by high flow line length) Assistive device: Rolling walker (2 wheeled) Gait Pattern/deviations: WFL(Within Functional Limits) Gait velocity: decreased   General Gait Details: Pt was able to take 5 x fwd and backwards but de-sats to 74% on HF 9L. pt would rest x 2 minutes and O2 would return to low 90s. repeated this 3 x. she continues to de-sat. Will continue to progress as able and medical limitations allow   Stairs             Wheelchair Mobility    Modified Rankin (Stroke Patients Only)       Balance  Overall balance assessment: Modified Independent Sitting-balance support: Feet supported Sitting balance-Leahy Scale: Good     Standing balance support: Single extremity supported Standing balance-Leahy Scale: Good Standing balance comment: NO LOB noted throughout all standing activity                            Cognition Arousal/Alertness: Awake/alert Behavior During Therapy: WFL for tasks  assessed/performed Overall Cognitive Status: Within Functional Limits for tasks assessed                                 General Comments: Pt A and O x 4      Exercises      General Comments        Pertinent Vitals/Pain Pain Assessment: No/denies pain    Home Living                      Prior Function            PT Goals (current goals can now be found in the care plan section) Acute Rehab PT Goals Patient Stated Goal: " I just want to go home" Progress towards PT goals: Progressing toward goals    Frequency    Min 2X/week      PT Plan Current plan remains appropriate    Co-evaluation     PT goals addressed during session: Mobility/safety with mobility        AM-PAC PT "6 Clicks" Mobility   Outcome Measure  Help needed turning from your back to your side while in a flat bed without using bedrails?: None Help needed moving from lying on your back to sitting on the side of a flat bed without using bedrails?: A Little Help needed moving to and from a bed to a chair (including a wheelchair)?: A Little Help needed standing up from a chair using your arms (e.g., wheelchair or bedside chair)?: A Little Help needed to walk in hospital room?: A Little Help needed climbing 3-5 steps with a railing? : A Little 6 Click Score: 19    End of Session Equipment Utilized During Treatment: Oxygen Activity Tolerance: Patient limited by fatigue Patient left: in chair;with chair alarm set;with call bell/phone within reach Nurse Communication: Mobility status PT Visit Diagnosis: Difficulty in walking, not elsewhere classified (R26.2);Muscle weakness (generalized) (M62.81)     Time: [id number] PT Time Calculation (min) (ACUTE ONLY): 25 min  Charges:  $Therapeutic Activity: 23-37 mins                     Julaine Fusi PTA 04/04/19, 12:08 PM

## 2019-04-04 NOTE — Progress Notes (Addendum)
[place] attempted visit as follow up from pt.'s stay on ICU and overall lengthy hospital admission; pt. appeared asleep and [place] judged better to allow her to rest.  [place] will follow up if possible.      04/04/19 1500  Clinical Encounter Type  Visited With Patient not available

## 2019-04-04 NOTE — Progress Notes (Signed)
Patient ID: Rebecca Kirby, female   DOB: [birth year], 74 y.o.   MRN: [id number] [facility] PROGRESS NOTE  Rebecca Kirby [id number] DOB: 1945/06/27 DOA: 03/19/2019 PCP: Idelle Crouch, MD  HPI/Subjective: Patient states that she got up and went to the bathroom on her own.  She does not complain of shortness of breath.  Slight cough.  Feeling a little stronger today than yesterday.  Objective: Vitals:   04/04/19 0022 04/04/19 0732  BP: (!) 128/52 (!) 114/48  Pulse: 89 81  Resp: 18 17  Temp: 99.5 F (37.5 C) 98.7 F (37.1 C)  SpO2: 90% 94%    Intake/Output Summary (Last 24 hours) at 04/04/2019 1232 Last data filed at 04/04/2019 0806 Gross per 24 hour  Intake 41.55 ml  Output 700 ml  Net -658.45 ml   Filed Weights   03/19/19 1700  Weight: 98.3 kg    ROS: Review of Systems  Constitutional: Positive for malaise/fatigue. Negative for chills and fever.  Eyes: Negative for blurred vision.  Respiratory: Positive for cough. Negative for shortness of breath.   Cardiovascular: Negative for chest pain.  Gastrointestinal: Negative for abdominal pain, constipation, diarrhea, nausea and vomiting.  Genitourinary: Negative for dysuria.  Musculoskeletal: Negative for joint pain.  Neurological: Negative for dizziness and headaches.   Exam: Physical Exam  Constitutional: She is oriented to person, place, and time.  HENT:  Nose: No mucosal edema.  Mouth/Throat: No oropharyngeal exudate or posterior oropharyngeal edema.  Eyes: Pupils are equal, round, and reactive to light. Conjunctivae and lids are normal.  Neck: Carotid bruit is not present.  Cardiovascular: S1 normal and S2 normal. Exam reveals no gallop.  No murmur heard. Respiratory: No respiratory distress. She has decreased breath sounds in the right lower field and the left lower field. She has no wheezes. She has no rhonchi. She has rales in the right lower field and the left lower field.  GI: Soft. Bowel sounds are  normal. There is no abdominal tenderness.  Musculoskeletal:     Right ankle: No swelling.     Left ankle: No swelling.  Lymphadenopathy:    She has no cervical adenopathy.  Neurological: She is alert and oriented to person, place, and time. No cranial nerve deficit.  Skin: Skin is warm. No rash noted. Nails show no clubbing.  Psychiatric: She has a normal mood and affect.      Data Reviewed: Basic Metabolic Panel: Recent Labs  Lab 03/29/19 0411 03/29/19 0411 03/30/19 [id number] 03/30/19 [id number] 03/31/19 0225 04/01/19 [id number] 04/02/19 0315 04/03/19 0351 04/04/19 0708  NA 133*   < > 135   < > 136 132* 132* 136 133*  K 4.7   < > 5.1   < > 4.3 4.6 4.1 4.1 4.2  CL 98   < > 98   < > 99 96* 97* 99 99  CO2 24   < > 30   < > 27 29 28 26 25   GLUCOSE 235*   < > 226*   < > 66* 190* 160* 97 176*  BUN 74*   < > 68*   < > 70* 58* 43* 41* 37*  CREATININE 1.12*   < > 1.19*   < > 1.18* 1.31* 1.15* 1.31* 1.33*  CALCIUM 8.4*   < > 8.6*   < > 9.0 8.7* 8.6* 8.7* 8.2*  MG  --   --  2.2  --   --  1.9 1.8  --   --   PHOS  5.3*  --  5.2*  5.6*  --  4.3 5.2* 3.5  --   --    < > = values in this interval not displayed.   Liver Function Tests: Recent Labs  Lab 03/30/19 0518 03/31/19 0225 04/02/19 0315  ALBUMIN 2.3* 2.3* 2.1*   CBC: Recent Labs  Lab 03/29/19 0411 03/30/19 0518 04/01/19 0554  WBC 15.7* 13.5* 15.2*  NEUTROABS 14.7*  --  13.9*  HGB 9.3* 9.3* 9.2*  HCT 28.5* 28.8* 28.8*  MCV 82.8 83.2 83.0  PLT 221 200 136*   BNP (last 3 results) Recent Labs    03/19/19 1218  BNP 139.0*    CBG: Recent Labs  Lab 04/03/19 1147 04/03/19 1642 04/03/19 2125 04/04/19 0730 04/04/19 1146  GLUCAP 147* 192* 278* 159* 244*     Studies: DG Chest Port 1 View  Result Date: 04/03/2019 CLINICAL DATA:  COVID-19 pneumonia. EXAM: PORTABLE CHEST 1 VIEW COMPARISON:  Single-view of the chest 04/01/2019 and 03/25/2019. FINDINGS: Extensive bilateral airspace disease is worse on the left. Aeration in the  right lower lung zone has mildly improved compared to the most recent exam. Heart size is upper normal. No pneumothorax or pleural effusion. IMPRESSION: Left worse than right airspace disease persists. Aeration in the right lower lung zone has mildly improved since the most recent exam. Electronically Signed   By: Inge Rise M.D.   On: 04/03/2019 14:03    Scheduled Meds: . Chlorhexidine Gluconate Cloth  6 each Topical Daily  . cholecalciferol  1,000 Units Oral Daily  . citalopram  40 mg Oral Daily  . enoxaparin (LOVENOX) injection  40 mg Subcutaneous Q24H  . fluticasone  2 spray Each Nare Daily  . furosemide  20 mg Intravenous Daily  . insulin aspart  0-5 Units Subcutaneous QHS  . insulin aspart  0-9 Units Subcutaneous TID WC  . insulin aspart  4 Units Subcutaneous TID WC  . insulin detemir  15 Units Subcutaneous QHS  . levothyroxine  175 mcg Oral Q0600  . nystatin  5 mL Oral QID  . Ensure Max Protein  11 oz Oral BID BM  . sulfamethoxazole-trimethoprim  1 tablet Oral Q12H   Continuous Infusions: . albumin human 12.5 g (04/04/19 0959)    Assessment/Plan:  1. Acute hypoxic respiratory failure secondary to COVID-19 infection and severe ARDS and pneumonia.  Currently on 4 L of regular nasal cannula.  With ambulation yesterday and today her pulse ox dropped down into the 70s.  In speaking with pulmonary, he would be comfortable if the pulse ox was in the 80s with ambulation.  Patient received remdesivir and steroids during the hospital course.  Pulmonary added IV Lasix to see if we can get oxygenation better.  Continue to monitor on a day-to-day basis on when to go home. 2. Weakness.  Continued physical therapy evaluation on a daily basis.  Patient needs to be able to go up steps in order to get into the house. 3. Acute kidney injury on chronic kidney disease stage IIIb.  Creatinine with the IV Lasix a little higher at 1.33. 4. History of metastatic renal cell carcinoma 5. Type 2  diabetes mellitus, with hemoglobin A1c of 7.4.  Patient on 20 units of detemir insulin nightly  6. hypothyroidism unspecified on levothyroxine 7. History of thrush on nystatin swish and swallow   Code Status:     Code Status Orders  (From admission, onward)         Start  Ordered   03/19/19 1526  Full code  Continuous     03/19/19 1525        Code Status History    Date Active Date Inactive Code Status Order ID Comments User Context   08/09/2014 1247 08/12/2014 1353 Full Code [id number]  Dereck Leep, MD Inpatient   Advance Care Planning Activity    Advance Directive Documentation     Most Recent Value  Type of Advance Directive  Healthcare Power of Attorney  Pre-existing out of facility DNR order (yellow form or pink MOST form)  --  "MOST" Form in Place?  --     Family Communication: Spoke with daughter on the phone Disposition Plan: Since pulse ox dropped down into the 70s today with ambulation she still needs some more time here.  Pulmonary is comfortable if the pulse ox stays in the 80s with ambulation.  Evaluate on a daily basis with ambulation to see if we can send her home.  Consultants:  Pulmonary  Time spent: 28 minutes, case discussed with pulmonary  Loletha Grayer  [facility]

## 2019-04-05 LAB — CBC WITH DIFFERENTIAL/PLATELET
Abs Immature Granulocytes: 0.07 10*3/uL (ref 0.00–0.07)
Basophils Absolute: 0 10*3/uL (ref 0.0–0.1)
Basophils Relative: 0 %
Eosinophils Absolute: 0.1 10*3/uL (ref 0.0–0.5)
Eosinophils Relative: 1 %
HCT: 27.2 % — ABNORMAL LOW (ref 36.0–46.0)
Hemoglobin: 8.8 g/dL — ABNORMAL LOW (ref 12.0–15.0)
Immature Granulocytes: 1 %
Lymphocytes Relative: 4 %
Lymphs Abs: 0.5 10*3/uL — ABNORMAL LOW (ref 0.7–4.0)
MCH: 26.6 pg (ref 26.0–34.0)
MCHC: 32.4 g/dL (ref 30.0–36.0)
MCV: 82.2 fL (ref 80.0–100.0)
Monocytes Absolute: 0.5 10*3/uL (ref 0.1–1.0)
Monocytes Relative: 4 %
Neutro Abs: 11 10*3/uL — ABNORMAL HIGH (ref 1.7–7.7)
Neutrophils Relative %: 90 %
Platelets: 140 10*3/uL — ABNORMAL LOW (ref 150–400)
RBC: 3.31 MIL/uL — ABNORMAL LOW (ref 3.87–5.11)
RDW: 14.3 % (ref 11.5–15.5)
WBC: 12.1 10*3/uL — ABNORMAL HIGH (ref 4.0–10.5)
nRBC: 0 % (ref 0.0–0.2)

## 2019-04-05 LAB — GLUCOSE, CAPILLARY
Glucose-Capillary: 155 mg/dL — ABNORMAL HIGH (ref 70–99)
Glucose-Capillary: 209 mg/dL — ABNORMAL HIGH (ref 70–99)
Glucose-Capillary: 186 mg/dL — ABNORMAL HIGH (ref 70–99)
Glucose-Capillary: 288 mg/dL — ABNORMAL HIGH (ref 70–99)

## 2019-04-05 LAB — CORTISOL: Cortisol, Plasma: 16.3 ug/dL

## 2019-04-05 LAB — PHOSPHORUS: Phosphorus: 4.3 mg/dL (ref 2.5–4.6)

## 2019-04-05 LAB — MAGNESIUM: Magnesium: 1.8 mg/dL (ref 1.7–2.4)

## 2019-04-05 LAB — VITAMIN D 25 HYDROXY (VIT D DEFICIENCY, FRACTURES): Vit D, 25-Hydroxy: 43.85 ng/mL (ref 30–100)

## 2019-04-05 NOTE — Progress Notes (Signed)
Patient ID: Rebecca Kirby, female   DOB: 09-03-1945, 74 y.o.   MRN: [id number] [facility] PROGRESS NOTE  Rebecca Kirby [id number] DOB: 1945-11-29 DOA: 03/19/2019 PCP: Idelle Crouch, MD  HPI/Subjective: Every day patient feeling a little bit better.  States she is breathing better but not quite back to normal.  When I stood her up on 4 L she did become hypoxic into the low 80s high 70s briefly.  She was able to stand up for period of time and then wanted to sit down.  Objective: Vitals:   04/04/19 2337 04/05/19 0728  BP: 128/75 (!) 109/46  Pulse: 90 80  Resp: 18 15  Temp: 99.5 F (37.5 C) 98 F (36.7 C)  SpO2: (!) 89% (!) 89%   No intake or output data in the 24 hours ending 04/05/19 1514 Filed Weights   03/19/19 1700  Weight: 98.3 kg    ROS: Review of Systems  Constitutional: Positive for malaise/fatigue. Negative for chills and fever.  Eyes: Negative for blurred vision.  Respiratory: Negative for cough and shortness of breath.   Cardiovascular: Negative for chest pain.  Gastrointestinal: Negative for abdominal pain, constipation, diarrhea, nausea and vomiting.  Genitourinary: Negative for dysuria.  Musculoskeletal: Negative for joint pain.  Neurological: Negative for dizziness and headaches.   Exam: Physical Exam  Constitutional: She is oriented to person, place, and time.  HENT:  Nose: No mucosal edema.  Mouth/Throat: No oropharyngeal exudate or posterior oropharyngeal edema.  Eyes: Pupils are equal, round, and reactive to light. Conjunctivae and lids are normal.  Neck: Carotid bruit is not present.  Cardiovascular: S1 normal and S2 normal. Exam reveals no gallop.  No murmur heard. Respiratory: No respiratory distress. She has decreased breath sounds in the right lower field and the left lower field. She has no wheezes. She has no rhonchi. She has no rales.  GI: Soft. Bowel sounds are normal. There is no abdominal tenderness.  Musculoskeletal:      Right ankle: No swelling.     Left ankle: No swelling.  Lymphadenopathy:    She has no cervical adenopathy.  Neurological: She is alert and oriented to person, place, and time. No cranial nerve deficit.  Skin: Skin is warm. No rash noted. Nails show no clubbing.  Psychiatric: She has a normal mood and affect.      Data Reviewed: Basic Metabolic Panel: Recent Labs  Lab 03/30/19 0518 03/30/19 0518 03/31/19 0225 04/01/19 [id number] 04/02/19 0315 04/03/19 0351 04/04/19 0708 04/05/19 1242  NA 135   < > 136 132* 132* 136 133*  --   K 5.1   < > 4.3 4.6 4.1 4.1 4.2  --   CL 98   < > 99 96* 97* 99 99  --   CO2 30   < > 27 29 28 26 25   --   GLUCOSE 226*   < > 66* 190* 160* 97 176*  --   BUN 68*   < > 70* 58* 43* 41* 37*  --   CREATININE 1.19*   < > 1.18* 1.31* 1.15* 1.31* 1.33*  --   CALCIUM 8.6*   < > 9.0 8.7* 8.6* 8.7* 8.2*  --   MG 2.2  --   --  1.9 1.8  --   --  1.8  PHOS 5.2*  5.6*  --  4.3 5.2* 3.5  --   --  4.3   < > = values in this interval not displayed.   Liver  Function Tests: Recent Labs  Lab 03/30/19 0518 03/31/19 0225 04/02/19 0315  ALBUMIN 2.3* 2.3* 2.1*   CBC: Recent Labs  Lab 03/30/19 0518 04/01/19 0554 04/05/19 1242  WBC 13.5* 15.2* 12.1*  NEUTROABS  --  13.9* 11.0*  HGB 9.3* 9.2* 8.8*  HCT 28.8* 28.8* 27.2*  MCV 83.2 83.0 82.2  PLT 200 136* 140*   BNP (last 3 results) Recent Labs    03/19/19 1218  BNP 139.0*    CBG: Recent Labs  Lab 04/04/19 1649 04/04/19 2044 04/04/19 2234 04/05/19 0734 04/05/19 1152  GLUCAP 259* 236* 254* 155* 209*     Studies: No results found.  Scheduled Meds: . Chlorhexidine Gluconate Cloth  6 each Topical Daily  . cholecalciferol  1,000 Units Oral Daily  . citalopram  40 mg Oral Daily  . enoxaparin (LOVENOX) injection  40 mg Subcutaneous Q24H  . fluticasone  2 spray Each Nare Daily  . furosemide  20 mg Intravenous Daily  . insulin aspart  0-5 Units Subcutaneous QHS  . insulin aspart  0-9 Units  Subcutaneous TID WC  . insulin aspart  4 Units Subcutaneous TID WC  . insulin detemir  20 Units Subcutaneous QHS  . levothyroxine  175 mcg Oral Q0600  . nystatin  5 mL Oral QID  . Ensure Max Protein  11 oz Oral BID BM  . sulfamethoxazole-trimethoprim  1 tablet Oral Q12H   Continuous Infusions: . albumin human 12.5 g (04/05/19 1153)    Assessment/Plan:  1. Acute hypoxic respiratory failure secondary to COVID-19 infection and severe ARDS and pneumonia.  Currently on 4 L of regular nasal cannula.  Patient received remdesivir and steroids during the hospital course.  Pulmonary added IV Lasix to see if we can get oxygenation better.  With ambulation today on 6 L of oxygen she dropped her saturations into the high 70s.  She was able to recover with rest.  In speaking with Dr. Lanney Gins of pulmonary he would like the pulse ox to stay in the 80s with ambulation prior to discharge home.  Reevaluate on a daily basis.   2. Weakness.  Continued physical therapy evaluation on a daily basis.  3. Acute kidney injury on chronic kidney disease stage IIIb.  Creatinine with the IV Lasix a little higher at 1.33. 4. History of metastatic renal cell carcinoma 5. Type 2 diabetes mellitus, with hemoglobin A1c of 7.4.  Patient on 20 units of detemir insulin nightly  6. Hypothyroidism unspecified on levothyroxine 7. History of thrush on nystatin swish and swallow   Code Status:     Code Status Orders  (From admission, onward)         Start     Ordered   03/19/19 1526  Full code  Continuous     03/19/19 1525        Code Status History    Date Active Date Inactive Code Status Order ID Comments User Context   08/09/2014 1247 08/12/2014 1353 Full Code [id number]  Dereck Leep, MD Inpatient   Advance Care Planning Activity    Advance Directive Documentation     Most Recent Value  Type of Advance Directive  Healthcare Power of Attorney  Pre-existing out of facility DNR order (yellow form or pink MOST  form)  --  "MOST" Form in Place?  --     Family Communication: Spoke with daughter at the bedside and on the phone Disposition Plan: Since pulse ox dropped down into the 70s today with ambulation she still  needs some more time here.  Pulmonary is comfortable if the pulse ox stays in the 80s with ambulation.  Evaluate on a daily basis with ambulation to see if we can send her home.  Consultants:  Pulmonary  Time spent: 32 minutes, case discussed with pulmonary, physical therapy, nursing and transitional care team.  Loletha Grayer  [facility]

## 2019-04-05 NOTE — Progress Notes (Signed)
Physical Therapy Treatment Patient Details Name: Rebecca Kirby MRN: [id number] DOB: 1945/10/26 Today's Date: 04/05/2019    History of Present Illness 74 year old female with a history of chronic anemia, anxiety disorder, history of colon CA, GERD, hypothyroidism renal cell carcinoma history of thyroid CA, pancreatic CA status post Whipple's procedure, diabetes, B TKA, hernia came into the ED due to worsening hypoxemia post COVID-19 infection.   MD assessment includes acute hypoxemic respiratory failure due to COVID19 induced bilateral multifocal pneumonia.    PT Comments    Patient making gradual progress towards mobility goals, but remains limited by desat into 70's despite 6L supplemental O2 via [place].  Tolerating slight increase in distance with less supplemental O2 and quicker recovery period this date; very eager to progress as able and generally frustrated with continued stay in hospital. Gait slow and cautious, but with reciprocal stepping pattern; no overt buckling or LOB.  BORG 5-6/10 after 8' distance with desat to 76-77% noted on 6L.  Recovers to 80% within 60 seconds, 84% within 90-120 seconds. Educated in role of activity pacing, energy conservation and use of clinical symptoms (SOB/BORG) as activity guide.  Worked to establish correlation between self-perceived SOB and O2 levels, using as guide for patient to self-initiate rest periods with activity.  Voices understanding, but generally frustrated with persistent limitations. Discussed potential for EMS transport to to home (to help conserve energy required within discharge process); patient adamantly refused.  Daughter comfortable 'bumping up' in transport chair/WC as needed.  Recommend this method vs patient attempting stair negotiation.  SaO2 on 4L O2 at rest = 86% SaO2 on 6L O2 at rest = 88% SaO2 on 6L O2 while ambulating = 76%   Patient suffers from acute respiratory failure due to multifocal covid-19 PNA which impairs his/her  ability to perform daily activities like toileting, feeding, dressing, grooming, bathing in the home. A cane, walker, crutch will not resolve the patient's issue with performing activities of daily living. A lightweight wheelchair is required/recommended and will allow patient to safely perform daily activities.   Patient can safely propel the wheelchair in the home or has a caregiver who can provide assistance.          Follow Up Recommendations  Home health PT;Supervision for mobility/OOB     Equipment Recommendations  Rolling walker with 5" wheels;3in1 (PT);Wheelchair (measurements PT)    Recommendations for Other Services       Precautions / Restrictions Precautions Precautions: Fall Restrictions Weight Bearing Restrictions: No Other Position/Activity Restrictions: Per MD notes, OK to participate with PT/OT with SpO2 >80%.    Mobility  Bed Mobility Overal bed mobility: Modified Independent                Transfers Overall transfer level: Needs assistance Equipment used: Rolling walker (2 wheeled) Transfers: Sit to/from Stand   Stand pivot transfers: Supervision;Modified independent (Device/Increase time)       General transfer comment: fair/good LE strength with movement transition; does require UE support for lift off and initial stabilization  Ambulation/Gait Ambulation/Gait assistance: Supervision Gait Distance (Feet): (8' x2) Assistive device: Rolling walker (2 wheeled)       General Gait Details: slow and cautious, but with reciprocal stepping pattern; no overt buckling or LOB.  BORG 5-6/10 after 8' distance with desat to 76-77% noted on 6L.  Recovers to 80% within 60 seconds, 84% within 90-120 seconds.   Stairs             Emergency planning/management officer  Modified Rankin (Stroke Patients Only)       Balance Overall balance assessment: Needs assistance Sitting-balance support: No upper extremity supported;Feet supported Sitting balance-Leahy  Scale: Good     Standing balance support: Bilateral upper extremity supported Standing balance-Leahy Scale: Good                              Cognition Arousal/Alertness: Awake/alert Behavior During Therapy: WFL for tasks assessed/performed Overall Cognitive Status: Within Functional Limits for tasks assessed                                 General Comments: generally frustrated with continued hospital stay; eager for discharge home      Exercises Other Exercises Other Exercises: Toilet transfer, ambulatory with RW, sup; sit/stand from [facility] with RW, sup/mod indep. Other Exercises: Educated in role of activity pacing, energy conservation and use of clinical symptoms (SOB/BORG) as activity guide.  Worked to establish correlation between self-perceived SOB and O2 levels, using as guide for patient to self-initiate rest periods with activity.  Voices understanding, but generally frustrated with persistent limitations. Other Exercises: Discussed potential for EMS transport to to home (to help conserve energy required within discharge process); patient adamantly refused.  Daughter comfortable 'bumping up' in transport chair/WC as needed.  Recommend this method vs patient attempting stair negotiation.    General Comments        Pertinent Vitals/Pain Pain Assessment: No/denies pain    Home Living                      Prior Function            PT Goals (current goals can now be found in the care plan section) Acute Rehab PT Goals Patient Stated Goal: " I just want to go home" PT Goal Formulation: With patient/family Time For Goal Achievement: 04/08/19 Potential to Achieve Goals: Good Progress towards PT goals: Progressing toward goals    Frequency    Min 2X/week      PT Plan Current plan remains appropriate    Co-evaluation              AM-PAC PT "6 Clicks" Mobility   Outcome Measure  Help needed turning from your back to your  side while in a flat bed without using bedrails?: None Help needed moving from lying on your back to sitting on the side of a flat bed without using bedrails?: None Help needed moving to and from a bed to a chair (including a wheelchair)?: None Help needed standing up from a chair using your arms (e.g., wheelchair or bedside chair)?: None Help needed to walk in hospital room?: None Help needed climbing 3-5 steps with a railing? : A Little 6 Click Score: 23    End of Session Equipment Utilized During Treatment: Oxygen Activity Tolerance: Treatment limited secondary to medical complications (Comment)(desat to 70's with exertion) Patient left: in chair;with chair alarm set;with call bell/phone within reach Nurse Communication: Mobility status PT Visit Diagnosis: Difficulty in walking, not elsewhere classified (R26.2);Muscle weakness (generalized) (M62.81)     Time: [id number] PT Time Calculation (min) (ACUTE ONLY): 38 min  Charges:  $Gait Training: 8-22 mins $Therapeutic Activity: 23-37 mins                     Mutasim Tuckey H. Owens Shark, PT, DPT, NCS 04/05/19,  3:00 PM [phone]

## 2019-04-05 NOTE — Plan of Care (Signed)
  Problem: Education: Goal: Knowledge of risk factors and measures for prevention of condition will improve Outcome: Progressing   Problem: Respiratory: Goal: Will maintain a patent airway Outcome: Progressing Goal: Complications related to the disease process, condition or treatment will be avoided or minimized Outcome: Progressing   Problem: Education: Goal: Knowledge of General Education information will improve Description: Including pain rating scale, medication(s)/side effects and non-pharmacologic comfort measures Outcome: Progressing   Problem: Health Behavior/Discharge Planning: Goal: Ability to manage health-related needs will improve Outcome: Progressing   Problem: Clinical Measurements: Goal: Ability to maintain clinical measurements within normal limits will improve Outcome: Progressing Goal: Will remain free from infection Outcome: Progressing Goal: Diagnostic test results will improve Outcome: Progressing Goal: Respiratory complications will improve Outcome: Progressing Goal: Cardiovascular complication will be avoided Outcome: Progressing   Problem: Activity: Goal: Risk for activity intolerance will decrease Outcome: Progressing   Problem: Nutrition: Goal: Adequate nutrition will be maintained Outcome: Progressing   Problem: Coping: Goal: Level of anxiety will decrease Outcome: Progressing   Problem: Elimination: Goal: Will not experience complications related to bowel motility Outcome: Progressing Goal: Will not experience complications related to urinary retention Outcome: Progressing   Problem: Pain Managment: Goal: General experience of comfort will improve Outcome: Progressing   Problem: Safety: Goal: Ability to remain free from injury will improve Outcome: Progressing   Problem: Skin Integrity: Goal: Risk for impaired skin integrity will decrease Outcome: Progressing   Problem: Education: Goal: Knowledge of General Education  information will improve Description: Including pain rating scale, medication(s)/side effects and non-pharmacologic comfort measures Outcome: Progressing   Problem: Health Behavior/Discharge Planning: Goal: Ability to manage health-related needs will improve Outcome: Progressing   Problem: Clinical Measurements: Goal: Ability to maintain clinical measurements within normal limits will improve Outcome: Progressing Goal: Will remain free from infection Outcome: Progressing Goal: Diagnostic test results will improve Outcome: Progressing Goal: Respiratory complications will improve Outcome: Progressing Goal: Cardiovascular complication will be avoided Outcome: Progressing   Problem: Activity: Goal: Risk for activity intolerance will decrease Outcome: Progressing   Problem: Nutrition: Goal: Adequate nutrition will be maintained Outcome: Progressing   Problem: Coping: Goal: Level of anxiety will decrease Outcome: Progressing   Problem: Elimination: Goal: Will not experience complications related to bowel motility Outcome: Progressing Goal: Will not experience complications related to urinary retention Outcome: Progressing   Problem: Pain Managment: Goal: General experience of comfort will improve Outcome: Progressing   Problem: Safety: Goal: Ability to remain free from injury will improve Outcome: Progressing   Problem: Skin Integrity: Goal: Risk for impaired skin integrity will decrease Outcome: Progressing

## 2019-04-05 NOTE — Plan of Care (Signed)
  Problem: Education: Goal: Knowledge of risk factors and measures for prevention of condition will improve Outcome: Progressing   Problem: Coping: Goal: Psychosocial and spiritual needs will be supported Outcome: Progressing   Problem: Respiratory: Goal: Will maintain a patent airway Outcome: Progressing Goal: Complications related to the disease process, condition or treatment will be avoided or minimized Outcome: Progressing   Problem: Education: Goal: Knowledge of General Education information will improve Description: Including pain rating scale, medication(s)/side effects and non-pharmacologic comfort measures Outcome: Progressing   Problem: Clinical Measurements: Goal: Ability to maintain clinical measurements within normal limits will improve Outcome: Progressing Goal: Will remain free from infection Outcome: Progressing Goal: Diagnostic test results will improve Outcome: Progressing Goal: Respiratory complications will improve Outcome: Progressing Goal: Cardiovascular complication will be avoided Outcome: Progressing   Problem: Activity: Goal: Risk for activity intolerance will decrease Outcome: Progressing   Problem: Nutrition: Goal: Adequate nutrition will be maintained Outcome: Progressing   Problem: Coping: Goal: Level of anxiety will decrease Outcome: Progressing   Problem: Elimination: Goal: Will not experience complications related to bowel motility Outcome: Progressing Goal: Will not experience complications related to urinary retention Outcome: Progressing   Problem: Pain Managment: Goal: General experience of comfort will improve Outcome: Progressing   Problem: Safety: Goal: Ability to remain free from injury will improve Outcome: Progressing   Problem: Skin Integrity: Goal: Risk for impaired skin integrity will decrease Outcome: Progressing   Problem: Education: Goal: Knowledge of General Education information will  improve Description: Including pain rating scale, medication(s)/side effects and non-pharmacologic comfort measures Outcome: Progressing   Problem: Health Behavior/Discharge Planning: Goal: Ability to manage health-related needs will improve Outcome: Progressing   Problem: Clinical Measurements: Goal: Ability to maintain clinical measurements within normal limits will improve Outcome: Progressing Goal: Will remain free from infection Outcome: Progressing Goal: Diagnostic test results will improve Outcome: Progressing Goal: Respiratory complications will improve Outcome: Progressing Goal: Cardiovascular complication will be avoided Outcome: Progressing   Problem: Activity: Goal: Risk for activity intolerance will decrease Outcome: Progressing   Problem: Nutrition: Goal: Adequate nutrition will be maintained Outcome: Progressing   Problem: Coping: Goal: Level of anxiety will decrease Outcome: Progressing   Problem: Elimination: Goal: Will not experience complications related to bowel motility Outcome: Progressing Goal: Will not experience complications related to urinary retention Outcome: Progressing   Problem: Pain Managment: Goal: General experience of comfort will improve Outcome: Progressing   Problem: Safety: Goal: Ability to remain free from injury will improve Outcome: Progressing   Problem: Skin Integrity: Goal: Risk for impaired skin integrity will decrease Outcome: Progressing

## 2019-04-05 NOTE — Progress Notes (Signed)
PULMONARY PROGRESS NOTE    Name: Rebecca Kirby MRN: [id number] DOB: [birth year]     LOS: 40   SUBJECTIVE FINDINGS & SIGNIFICANT EVENTS   Patient description:   This is a very pleasant 74 year old female with complex comorbid history s/p COVID 19 pneumonitis with acute hypoxemic respiratory failure   Lines / Drains: PIVx2  Cultures / Sepsis markers: Respiratory culture -in progress MRSA PCT - negative Blood culture x2 - NTDx24h -reincubation in progress  Antimicrobials: zithromax 500 PO daily -finished CAP regimen Vecluri x 5 days per protocol- finished Ivermectin 9mg  po daily- d/cd 6 days ago  Protocols / Consultants: PCCM Hospitalist  Tests / Events: [place] - Lower extermities - negative for DVT bilaterally  04/01/19- transferred to medical floor from SDU/MICU 04/02/19 - patient on 5L/min [place] she lucid, able to take 411ml tidal volumes on incentive spirometer, good hand strength and ROMx4, was having breakfast without assistance during my evaluation in am, lung auscultation with mild crackles worse on right. Care plan discussed with daughter at bedside.   04/03/19 -case discussed with PT plan for hallway exertional walk today, discussed with primary attending will plan for possible d/c home tommorow if continues to improve today with decreased O2 req.  Discusssed with [employer] - O2 concentrator ready for home d/c.  Reviewed CXR today -as per radiologist improved from previous.  04/04/19- Patient appears clinically improved, she had BM well formed, she is on 4L [place], she speaks in full sentences sat up and got out of bed without assistance , walked to bathroom by herself with walker 04/04/19- patient frustrated as she wishes to be discharged home, she is overal improved but requiring 4L/min at rest and 6 with  exertion.  PT discharge eval today. Discussed with Dr Earleen Newport and daughter Alfred Levins today.   PAST MEDICAL HISTORY   Past Medical History:  Diagnosis Date  . Anemia   . Anxiety   . Colon cancer ([place])   . Diabetes mellitus without complication ([place])   . Family history of adverse reaction to anesthesia    daughter had reaction to anesthesia and was in icu  . GERD (gastroesophageal reflux disease)   . Hypertension   . Hypothyroidism   . Kidney cancer, primary, with metastasis from kidney to other site [facility]) 08/26/2014   right  . Pancreas cancer ([place]) 2018  . Thyroid cancer [facility])      SURGICAL HISTORY   Past Surgical History:  Procedure Laterality Date  . CHOLECYSTECTOMY    . COLON RESECTION    . COLONOSCOPY WITH PROPOFOL N/A 09/01/2015   Procedure: COLONOSCOPY WITH PROPOFOL;  Surgeon: Manya Silvas, MD;  Location: [facility] ENDOSCOPY;  Service: Endoscopy;  Laterality: N/A;  . CYSTOSCOPY  12/28/2016   Procedure: CYSTOSCOPY;  Surgeon: Ward, Honor Loh, MD;  Location: [facility] ORS;  Service: Gynecology;;  . ESOPHAGOGASTRODUODENOSCOPY (EGD) WITH PROPOFOL N/A 09/01/2015   Procedure: ESOPHAGOGASTRODUODENOSCOPY (EGD) WITH PROPOFOL;  Surgeon: Manya Silvas, MD;  Location: [facility] ENDOSCOPY;  Service: Endoscopy;  Laterality: N/A;  . EUS N/A 06/28/2016   Procedure: FULL UPPER ENDOSCOPIC ULTRASOUND (EUS) RADIAL;  Surgeon: Jola Schmidt, MD;  Location: [facility] ENDOSCOPY;  Service: Endoscopy;  Laterality: N/A;  . EXCISION MASS NECK N/A 09/20/2015   Procedure: EXCISION MASS NECK;  Surgeon: Beverly Gust, MD;  Location: [facility];  Service: ENT;  Laterality: N/A;  . LAPAROSCOPIC BILATERAL SALPINGO OOPHERECTOMY Bilateral 12/28/2016   Procedure: LAPAROSCOPIC BILATERAL SALPINGO OOPHORECTOMY;  Surgeon: Ward, Honor Loh, MD;  [facility]  ORS;  Service: Gynecology;  Laterality: Bilateral;  . LAPAROSCOPIC HYSTERECTOMY N/A 12/28/2016   Procedure: HYSTERECTOMY TOTAL LAPAROSCOPIC;  Surgeon: Ward, Honor Loh,  MD;  Location: [facility] ORS;  Service: Gynecology;  Laterality: N/A;  . LAPAROSCOPIC LYSIS OF ADHESIONS  12/28/2016   Procedure: LAPAROSCOPIC EXTENSIVE LYSIS OF ADHESIONS;  Surgeon: Ward, Honor Loh, MD;  Location: [facility] ORS;  Service: Gynecology;;  . rt kidney removed Right   . THYROGLOSSAL DUCT CYST Right 09/20/2015   Procedure: excision sub mandibular gland right;  Surgeon: Beverly Gust, MD;  Location: [facility];  Service: ENT;  Laterality: Right;  . THYROID SURGERY Bilateral   . TOTAL KNEE ARTHROPLASTY Right   . TOTAL KNEE ARTHROPLASTY Left 08/09/2014   Procedure: TOTAL KNEE ARTHROPLASTY;  Surgeon: Dereck Leep, MD;  Location: [facility] ORS;  Service: Orthopedics;  Laterality: Left;     FAMILY HISTORY   Family History  Problem Relation Age of Onset  . Breast cancer Maternal Aunt   . Breast cancer Cousin      SOCIAL HISTORY   Social History   Tobacco Use  . Smoking status: Former Smoker    Quit date: 07/29/1994    Years since quitting: 24.7  . Smokeless tobacco: Never Used  Substance Use Topics  . Alcohol use: No  . Drug use: No     MEDICATIONS   Current Medication:  Current Facility-Administered Medications:  .  acetaminophen (TYLENOL) tablet 650 mg, 650 mg, Oral, Q6H PRN, Jennye Boroughs, MD, 325 mg at 04/01/19 1951 .  albumin human 25 % solution 12.5 g, 12.5 g, Intravenous, Daily, [place] Lapaglia, MD, Last Rate: 60 mL/hr at 04/05/19 1153, 12.5 g at 04/05/19 1153 .  albuterol (VENTOLIN HFA) 108 (90 Base) MCG/ACT inhaler 2 puff, 2 puff, Inhalation, Q6H PRN, Flora Lipps, MD, 2 puff at 03/26/19 0541 .  Chlorhexidine Gluconate Cloth 2 % PADS 6 each, 6 each, Topical, Daily, Ottie Glazier, MD, 6 each at 04/04/19 [phone] .  cholecalciferol (VITAMIN D3) tablet 1,000 Units, 1,000 Units, Oral, Daily, Jennye Boroughs, MD, 1,000 Units at 04/05/19 0751 .  citalopram (CELEXA) tablet 40 mg, 40 mg, Oral, Daily, Jennye Boroughs, MD, 40 mg at 04/05/19 0751 .  enoxaparin (LOVENOX)  injection 40 mg, 40 mg, Subcutaneous, Q24H, Jennye Boroughs, MD, 40 mg at 04/04/19 2222 .  fluticasone (FLONASE) 50 MCG/ACT nasal spray 2 spray, 2 spray, Each Nare, Daily, Jennye Boroughs, MD, 2 spray at 04/05/19 1143 .  furosemide (LASIX) injection 20 mg, 20 mg, Intravenous, Daily, Ottie Glazier, MD, 20 mg at 04/05/19 0752 .  insulin aspart (novoLOG) injection 0-5 Units, 0-5 Units, Subcutaneous, QHS, Awilda Bill, NP, 3 Units at 04/04/19 2342 .  insulin aspart (novoLOG) injection 0-9 Units, 0-9 Units, Subcutaneous, TID WC, Dallie Piles, RPH, 3 Units at 04/05/19 1202 .  insulin aspart (novoLOG) injection 4 Units, 4 Units, Subcutaneous, TID WC, Dallie Piles, RPH, 4 Units at 04/05/19 1203 .  insulin detemir (LEVEMIR) injection 20 Units, 20 Units, Subcutaneous, QHS, Loletha Grayer, MD, 20 Units at 04/04/19 2218 .  levothyroxine (SYNTHROID) tablet 175 mcg, 175 mcg, Oral, Q0600, Jennye Boroughs, MD, 175 mcg at 04/05/19 0751 .  nystatin (MYCOSTATIN) 100000 UNIT/ML suspension 500,000 Units, 5 mL, Oral, QID, Awilda Bill, NP, 500,000 Units at 04/04/19 2220 .  protein supplement (ENSURE MAX) liquid, 11 oz, Oral, BID BM, Loletha Grayer, MD, 11 oz at 04/05/19 1143 .  sulfamethoxazole-trimethoprim (BACTRIM) 400-80 MG per tablet 1 tablet, 1 tablet, Oral, Q12H, Ottie Glazier, MD,  1 tablet at 04/05/19 1143    ALLERGIES   Statins    REVIEW OF SYSTEMS     10 point ROS neg except as per sujective findings  PHYSICAL EXAMINATION   Vital Signs: Temp:  [98 F (36.7 C)-99.5 F (37.5 C)] 98 F (36.7 C) (02/07 0728) Pulse Rate:  [80-101] 80 (02/07 0728) Resp:  [15-18] 15 (02/07 0728) BP: (109-128)/(46-75) 109/46 (02/07 0728) SpO2:  [88 %-89 %] 89 % (02/07 0728)  GENERAL:well nourished, NAD HEAD: Normocephalic, atraumatic.  EYES: Pupils equal, round, reactive to light.  No scleral icterus.  MOUTH: Moist mucosal membrane. NECK: Supple. No thyromegaly. No nodules. No JVD.   PULMONARY: decreased breath sounds bilaterally CARDIOVASCULAR: S1 and S2. Regular rate and rhythm. No murmurs, rubs, or gallops.  GASTROINTESTINAL: Soft, nontender, non-distended. No masses. Positive bowel sounds. No hepatosplenomegaly.  MUSCULOSKELETAL: No swelling, clubbing, or edema.  NEUROLOGIC: Mild distress due to acute illness SKIN:intact,warm,dry   PERTINENT DATA     Infusions: . albumin human 12.5 g (04/05/19 1153)   Scheduled Medications: . Chlorhexidine Gluconate Cloth  6 each Topical Daily  . cholecalciferol  1,000 Units Oral Daily  . citalopram  40 mg Oral Daily  . enoxaparin (LOVENOX) injection  40 mg Subcutaneous Q24H  . fluticasone  2 spray Each Nare Daily  . furosemide  20 mg Intravenous Daily  . insulin aspart  0-5 Units Subcutaneous QHS  . insulin aspart  0-9 Units Subcutaneous TID WC  . insulin aspart  4 Units Subcutaneous TID WC  . insulin detemir  20 Units Subcutaneous QHS  . levothyroxine  175 mcg Oral Q0600  . nystatin  5 mL Oral QID  . Ensure Max Protein  11 oz Oral BID BM  . sulfamethoxazole-trimethoprim  1 tablet Oral Q12H   PRN Medications: acetaminophen, albuterol Hemodynamic parameters:   Intake/Output: 02/06 0701 - 02/07 0700 In: -  Out: 400 [Urine:400]  Ventilator  Settings:      LAB RESULTS:  Basic Metabolic Panel: Recent Labs  Lab 03/30/19 0518 03/30/19 0518 03/31/19 0225 03/31/19 0225 04/01/19 [id number] 04/01/19 [id number] 04/02/19 0315 04/02/19 0315 04/03/19 0351 04/04/19 0708  NA 135   < > 136  --  132*  --  132*  --  136 133*  K 5.1   < > 4.3   < > 4.6   < > 4.1   < > 4.1 4.2  CL 98   < > 99  --  96*  --  97*  --  99 99  CO2 30   < > 27  --  29  --  28  --  26 25  GLUCOSE 226*   < > 66*  --  190*  --  160*  --  97 176*  BUN 68*   < > 70*  --  58*  --  43*  --  41* 37*  CREATININE 1.19*   < > 1.18*  --  1.31*  --  1.15*  --  1.31* 1.33*  CALCIUM 8.6*   < > 9.0  --  8.7*  --  8.6*  --  8.7* 8.2*  MG 2.2  --   --   --  1.9   --  1.8  --   --   --   PHOS 5.2*  5.6*  --  4.3  --  5.2*  --  3.5  --   --   --    < > = values in this interval not  displayed.   Liver Function Tests: Recent Labs  Lab 03/30/19 0518 03/31/19 0225 04/02/19 0315  ALBUMIN 2.3* 2.3* 2.1*   No results for input(s): LIPASE, AMYLASE in the last 168 hours. No results for input(s): AMMONIA in the last 168 hours. CBC: Recent Labs  Lab 03/30/19 0518 04/01/19 0554  WBC 13.5* 15.2*  NEUTROABS  --  13.9*  HGB 9.3* 9.2*  HCT 28.8* 28.8*  MCV 83.2 83.0  PLT 200 136*   Cardiac Enzymes: No results for input(s): CKTOTAL, CKMB, CKMBINDEX, TROPONINI in the last 168 hours. BNP: Invalid input(s): POCBNP CBG: Recent Labs  Lab 04/04/19 1649 04/04/19 2044 04/04/19 2234 04/05/19 0734 04/05/19 1152  GLUCAP 259* 236* 254* 155* 209*     IMAGING RESULTS:  Imaging: DG Chest Port 1 View  Result Date: 04/03/2019 CLINICAL DATA:  COVID-19 pneumonia. EXAM: PORTABLE CHEST 1 VIEW COMPARISON:  Single-view of the chest 04/01/2019 and 03/25/2019. FINDINGS: Extensive bilateral airspace disease is worse on the left. Aeration in the right lower lung zone has mildly improved compared to the most recent exam. Heart size is upper normal. No pneumothorax or pleural effusion. IMPRESSION: Left worse than right airspace disease persists. Aeration in the right lower lung zone has mildly improved since the most recent exam. Electronically Signed   By: Inge Rise M.D.   On: 04/03/2019 14:03             ASSESSMENT AND PLAN    -Multidisciplinary rounds held today  Acute hypoxemic respiratory failure  - due to COVID19 induced bilateral multifocal pneumonia -continue ISS with ACHS blood sugar checks - PT/OT - walked few feet with desaturation yesterday, will do again today , optimizing for d/c home asap - initiate MetaNEB - patient is out of COVID isolation now -D/c alvesco / duonebs - will keep Albuterol only  -stopped systemic steroids - lasix  20 iv daily - auscultation with crackles worse on right compared to previous - total 10L net negative  - bronchopulmonary hygiene with albuterol and incentive spirometer -continue to encourage patient participation -Beta-D-Glucan - <31 - normal  -appreciate hospitalist team collaboration -continue to wean down O2 - currently at 4L/min [place] - O2 goal at >80% is acceptable -wbc count mildly elevated without additional steroids, will restart prophylactic antibiotics for secondary prevention of bacterial pneumonia and PCP ppx post high dose prolonged systemic steroids.   Moderate protein calorie malnutrition  - Albumin <2.8 , muscular weakness/deconditioning  - nourishment - continue supplement with meals daily   - 12.5 mg Albumin infusion - 25% daily IV    Multiple comorbid conditions -Hx of RCC, Pancreatic CA, thyroid CA, colon CA -hx of liver cirrosis and CKD - predisposing to hypercoagulable state -currently on Lovenox PPX dose 40 [place] daily will increase to bid while acutely ill.    Essential HTN   stopped norvasc, bystolic and ibersartan -to decrease nephro and hepatotoxicity while acutely ill.  Monitoring BP closely , thus far well controlled  -HR and BP have been in reference range on this regimen and patient states she is not anxious.    Chronic diastilic CHF - Grade I  - strict I&Os  - gentle diuresis in lieu of CKD  - fluid restriction to 1200cc/24h  -repeat CXR -reviewed today    Hypothyroidism -continue home synthroid   Chronic Renal Failure -follow chem 7 -follow UO -continue PureWick external urinary Catheter-assess need daily -patient has been on diuretic 20 bid Lasix - strict I&Os-now10L net negative -limit PO fluid intake  to 1200cc/24h -stop non-essential nephrotoxic medications - d/c protonix      GI/Nutrition GI PROPHYLAXIS as indicated-stopping PPI  DIET-->TF's as tolerated Constipation protocol as indicated  ENDO - ICU hypoglycemic\Hyperglycemia  protocol -check FSBS per protocol - currently q4h    ELECTROLYTES -follow labs only as needed -replace as needed -pharmacy consultation   DVT/GI PRX ordered -SCDs  TRANSFUSIONS AS NEEDED MONITOR FSBS ASSESS the need for LABS as needed     This document was prepared using Dragon voice recognition software and may include unintentional dictation errors.    Ottie Glazier, M.D.  Division of [place]

## 2019-04-06 ENCOUNTER — Inpatient Hospital Stay: Payer: Medicare Other

## 2019-04-06 DIAGNOSIS — N1831 Chronic kidney disease, stage 3a: Secondary | ICD-10-CM

## 2019-04-06 DIAGNOSIS — D509 Iron deficiency anemia, unspecified: Secondary | ICD-10-CM

## 2019-04-06 LAB — GLUCOSE, CAPILLARY
Glucose-Capillary: 176 mg/dL — ABNORMAL HIGH (ref 70–99)
Glucose-Capillary: 214 mg/dL — ABNORMAL HIGH (ref 70–99)
Glucose-Capillary: 261 mg/dL — ABNORMAL HIGH (ref 70–99)
Glucose-Capillary: 340 mg/dL — ABNORMAL HIGH (ref 70–99)

## 2019-04-06 LAB — BASIC METABOLIC PANEL
Anion gap: 10 (ref 5–15)
BUN: 45 mg/dL — ABNORMAL HIGH (ref 8–23)
CO2: 25 mmol/L (ref 22–32)
Calcium: 7.9 mg/dL — ABNORMAL LOW (ref 8.9–10.3)
Chloride: 99 mmol/L (ref 98–111)
Creatinine, Ser: 1.73 mg/dL — ABNORMAL HIGH (ref 0.44–1.00)
GFR calc Af Amer: 33 mL/min — ABNORMAL LOW (ref >60)
GFR calc non Af Amer: 28 mL/min — ABNORMAL LOW (ref >60)
Glucose, Bld: 230 mg/dL — ABNORMAL HIGH (ref 70–99)
Potassium: 3.8 mmol/L (ref 3.5–5.1)
Sodium: 134 mmol/L — ABNORMAL LOW (ref 135–145)

## 2019-04-06 LAB — HEMOGLOBIN: Hemoglobin: 8 g/dL — ABNORMAL LOW (ref 12.0–15.0)

## 2019-04-06 MED ORDER — SODIUM CHLORIDE 0.9 % IV SOLN
500.0000 mg | Freq: Once | INTRAVENOUS | Status: AC
  Administered 2019-04-06: 15:00:00 500 mg via INTRAVENOUS
  Filled 2019-04-06: qty 25

## 2019-04-06 NOTE — Progress Notes (Addendum)
Inpatient Diabetes Program Recommendations  AACE/ADA: New Consensus Statement on Inpatient Glycemic Control (2015)  Target Ranges:  Prepandial:   less than 140 mg/dL      Peak postprandial:   less than 180 mg/dL (1-2 hours)      Critically ill patients:  140 - 180 mg/dL   Results for Rebecca Kirby, Rebecca Kirby (MRN [id number]) as of 04/06/2019 14:13  Ref. Range 04/05/2019 07:34 04/05/2019 11:52 04/05/2019 17:01 04/05/2019 21:30  Glucose-Capillary Latest Ref Range: 70 - 99 mg/dL 155 (H)  6 units NOVOLOG  209 (H)  7 units NOVOLOG  186 (H)  6 units NOVOLOG  288 (H)  3 units NOVOLOG +  20 units LANTUS   Results for Rebecca Kirby, Rebecca Kirby (MRN [id number]) as of 04/06/2019 14:13  Ref. Range 04/06/2019 08:21 04/06/2019 11:53  Glucose-Capillary Latest Ref Range: 70 - 99 mg/dL 176 (H)  6 units NOVOLOG  214 (H)  7 units NOVOLOG     Home DM Meds: Tresiba 90 units QHS                             Humalog 18-34 units TID                             Metformin 500 mg BID   Current Orders: Levemir 20 units QHS       Novolog Sensitive Correction Scale/ SSI (0-9 units) TID AC + HS      Novolog 4 units TID with meals      MD- Please consider increasing the Novolog Meal Coverage to 8 units TID with meals     Endocrinologist: Dr. Lucilla Lame with [name] seen 01/19/2019--No changes made to pt's insulin regimen at that visit--Patient was instructed to take the following: Metformin ER 500 mg BID Tresiba 90 units Daily Humalog 14 units with Breakfast/ 14 units with Lunch/ 24 units with Dinner Humalog 2 units for every 50 mg/dl >150 mg/dl    --Will follow patient during hospitalization--  Wyn Quaker RN, MSN, CDE Diabetes Coordinator Inpatient Glycemic Control Team Team Pager: [phone] (8a-5p)

## 2019-04-06 NOTE — Progress Notes (Signed)
Patient ID: Rebecca Kirby, female   DOB: [birth year], 74 y.o.   MRN: [id number] [facility] PROGRESS NOTE  Rebecca Kirby [id number] DOB: 06/11/1945 DOA: 03/19/2019 PCP: Idelle Crouch, MD  HPI/Subjective: Patient states every day she is feeling better.  Feeling stronger.  States her breathing is okay and offers no complaints.  Objective: Vitals:   04/06/19 0007 04/06/19 0823  BP: (!) 117/52 (!) 122/41  Pulse: 87 86  Resp: 16 18  Temp: 97.9 F (36.6 C) 97.8 F (36.6 C)  SpO2: 93% 98%    Filed Weights   03/19/19 1700  Weight: 98.3 kg    ROS: Review of Systems  Constitutional: Negative for chills and fever.  Eyes: Negative for blurred vision.  Respiratory: Negative for cough and shortness of breath.   Cardiovascular: Negative for chest pain.  Gastrointestinal: Negative for abdominal pain, constipation, diarrhea, nausea and vomiting.  Genitourinary: Negative for dysuria.  Musculoskeletal: Negative for joint pain.  Neurological: Negative for dizziness and headaches.   Exam: Physical Exam  Constitutional: She is oriented to person, place, and time.  HENT:  Nose: No mucosal edema.  Mouth/Throat: No oropharyngeal exudate or posterior oropharyngeal edema.  Eyes: Pupils are equal, round, and reactive to light. Conjunctivae and lids are normal.  Neck: Carotid bruit is not present.  Cardiovascular: S1 normal and S2 normal. Exam reveals no gallop.  No murmur heard. Respiratory: No respiratory distress. She has decreased breath sounds in the right lower field and the left lower field. She has no wheezes. She has no rhonchi. She has no rales.  GI: Soft. Bowel sounds are normal. There is no abdominal tenderness.  Musculoskeletal:     Right ankle: No swelling.     Left ankle: No swelling.  Lymphadenopathy:    She has no cervical adenopathy.  Neurological: She is alert and oriented to person, place, and time. No cranial nerve deficit.  Skin: Skin is warm. No rash  noted. Nails show no clubbing.  Psychiatric: She has a normal mood and affect.      Data Reviewed: Basic Metabolic Panel: Recent Labs  Lab 03/31/19 0225 03/31/19 0225 04/01/19 [id number] 04/02/19 0315 04/03/19 0351 04/04/19 0708 04/05/19 1242 04/06/19 0110  NA 136   < > 132* 132* 136 133*  --  134*  K 4.3   < > 4.6 4.1 4.1 4.2  --  3.8  CL 99   < > 96* 97* 99 99  --  99  CO2 27   < > 29 28 26 25   --  25  GLUCOSE 66*   < > 190* 160* 97 176*  --  230*  BUN 70*   < > 58* 43* 41* 37*  --  45*  CREATININE 1.18*   < > 1.31* 1.15* 1.31* 1.33*  --  1.73*  CALCIUM 9.0   < > 8.7* 8.6* 8.7* 8.2*  --  7.9*  MG  --   --  1.9 1.8  --   --  1.8  --   PHOS 4.3  --  5.2* 3.5  --   --  4.3  --    < > = values in this interval not displayed.   Liver Function Tests: Recent Labs  Lab 03/31/19 0225 04/02/19 0315  ALBUMIN 2.3* 2.1*   CBC: Recent Labs  Lab 04/01/19 0554 04/05/19 1242 04/06/19 0110  WBC 15.2* 12.1*  --   NEUTROABS 13.9* 11.0*  --   HGB 9.2* 8.8* 8.0*  HCT 28.8*  27.2*  --   MCV 83.0 82.2  --   PLT 136* 140*  --    BNP (last 3 results) Recent Labs    03/19/19 1218  BNP 139.0*    CBG: Recent Labs  Lab 04/05/19 1152 04/05/19 1701 04/05/19 2130 04/06/19 0821 04/06/19 1153  GLUCAP 209* 186* 288* 176* 214*      Scheduled Meds: . Chlorhexidine Gluconate Cloth  6 each Topical Daily  . cholecalciferol  1,000 Units Oral Daily  . citalopram  40 mg Oral Daily  . enoxaparin (LOVENOX) injection  40 mg Subcutaneous Q24H  . fluticasone  2 spray Each Nare Daily  . insulin aspart  0-5 Units Subcutaneous QHS  . insulin aspart  0-9 Units Subcutaneous TID WC  . insulin aspart  4 Units Subcutaneous TID WC  . insulin detemir  20 Units Subcutaneous QHS  . levothyroxine  175 mcg Oral Q0600  . nystatin  5 mL Oral QID  . Ensure Max Protein  11 oz Oral BID BM  . sulfamethoxazole-trimethoprim  1 tablet Oral Q12H   Continuous Infusions: . iron sucrose       Assessment/Plan:  1. Acute hypoxic respiratory failure secondary to COVID-19 infection and severe ARDS and pneumonia.  Currently on 4 L of regular nasal cannula at rest and 6 L with ambulation.  Patient desaturated today into the 70s and 60s with ambulation.  Continue to monitor.  Need to have pulse ox stayed in the 80s in order to go home. 2. Acute kidney injury on chronic kidney disease stage IIIa.  Hold IV Lasix and continue to monitor.  Check BMP tomorrow morning. 3. Iron deficiency anemia.  We will give IV Alroy Bailiff for today.  Hemoglobin drifted down despite giving IV Lasix.  Patient denies any bleeding.  We will get a type and cross tomorrow. 4. Weakness.  Continued physical therapy evaluation on a daily basis.  5. History of metastatic renal cell carcinoma 6. Type 2 diabetes mellitus, with hemoglobin A1c of 7.4.  Continue 20 units of detemir insulin nightly.  7. Hypothyroidism unspecified on levothyroxine 8. History of thrush on nystatin swish and swallow   Code Status:     Code Status Orders  (From admission, onward)         Start     Ordered   03/19/19 1526  Full code  Continuous     03/19/19 1525        Code Status History    Date Active Date Inactive Code Status Order ID Comments User Context   08/09/2014 1247 08/12/2014 1353 Full Code [id number]  Dereck Leep, MD Inpatient   Advance Care Planning Activity    Advance Directive Documentation     Most Recent Value  Type of Advance Directive  Healthcare Power of Attorney  Pre-existing out of facility DNR order (yellow form or pink MOST form)  --  "MOST" Form in Place?  --     Family Communication: Spoke with daughter on the phone Disposition Plan: Since pulse ox dropped down into the 60s and 70s today with ambulation she still needs some more time here.  Pulmonary is comfortable if the pulse ox stays in the 80s with ambulation.  Evaluate on a daily basis with ambulation to see if we can send her  home.  Consultants:  Pulmonary  Time spent: 26 minutes, case discussed with pulmonary, physical therapy.  [place]  [facility] [employer]

## 2019-04-06 NOTE — Progress Notes (Signed)
PULMONARY PROGRESS NOTE    Name: Rebecca Kirby MRN: [id number] DOB: 08/27/1945     LOS: 66   SUBJECTIVE FINDINGS & SIGNIFICANT EVENTS   Patient description:   This is a very pleasant 74 year old female with complex comorbid history s/p COVID 19 pneumonitis with acute hypoxemic respiratory failure   Lines / Drains: PIVx2  Cultures / Sepsis markers: Respiratory culture -in progress MRSA PCT - negative Blood culture x2 - NTDx24h -reincubation in progress  Antimicrobials: zithromax 500 PO daily -finished CAP regimen Vecluri x 5 days per protocol- finished Ivermectin 9mg  po daily- d/cd 6 days ago  Protocols / Consultants: [facility]M Hospitalist  Tests / Events: [place] - Lower extermities - negative for DVT bilaterally  04/01/19- transferred to medical floor from SDU/MICU 04/02/19 - patient on 5L/min [place] she lucid, able to take 460ml tidal volumes on incentive spirometer, good hand strength and ROMx4, was having breakfast without assistance during my evaluation in am, lung auscultation with mild crackles worse on right. Care plan discussed with daughter at bedside.   04/03/19 -case discussed with PT plan for hallway exertional walk today, discussed with primary attending will plan for possible d/c home tommorow if continues to improve today with decreased O2 req.  Discusssed with [employer] - O2 concentrator ready for home d/c.  Reviewed CXR today -as per radiologist improved from previous.  04/04/19- Patient appears clinically improved, she had BM well formed, she is on 4L [place], she speaks in full sentences sat up and got out of bed without assistance , walked to bathroom by herself with walker 04/04/19- patient frustrated as she wishes to be discharged home, she is overal improved but requiring 4L/min at rest and 6 with  exertion.  PT discharge eval today. Discussed with Dr Earleen Newport and daughter Alfred Levins today.  04/06/19-patient is improved.  She is working better with PT and is being optimized for discharge home to family.  IV venofer due to chronic recurrent anemia.   PAST MEDICAL HISTORY   Past Medical History:  Diagnosis Date  . Anemia   . Anxiety   . Colon cancer ([place])   . Diabetes mellitus without complication ([place])   . Family history of adverse reaction to anesthesia    daughter had reaction to anesthesia and was in icu  . GERD (gastroesophageal reflux disease)   . Hypertension   . Hypothyroidism   . Kidney cancer, primary, with metastasis from kidney to other site [facility]) 08/26/2014   right  . Pancreas cancer ([place]) 2018  . Thyroid cancer [facility])      SURGICAL HISTORY   Past Surgical History:  Procedure Laterality Date  . CHOLECYSTECTOMY    . COLON RESECTION    . COLONOSCOPY WITH PROPOFOL N/A 09/01/2015   Procedure: COLONOSCOPY WITH PROPOFOL;  Surgeon: Manya Silvas, MD;  Location: [facility] ENDOSCOPY;  Service: Endoscopy;  Laterality: N/A;  . CYSTOSCOPY  12/28/2016   Procedure: CYSTOSCOPY;  Surgeon: Ward, Honor Loh, MD;  Location: [facility] ORS;  Service: Gynecology;;  . ESOPHAGOGASTRODUODENOSCOPY (EGD) WITH PROPOFOL N/A 09/01/2015   Procedure: ESOPHAGOGASTRODUODENOSCOPY (EGD) WITH PROPOFOL;  Surgeon: Manya Silvas, MD;  Location: [facility] ENDOSCOPY;  Service: Endoscopy;  Laterality: N/A;  . EUS N/A 06/28/2016   Procedure: FULL UPPER ENDOSCOPIC ULTRASOUND (EUS) RADIAL;  Surgeon: Jola Schmidt, MD;  Location: [facility] ENDOSCOPY;  Service: Endoscopy;  Laterality: N/A;  . EXCISION MASS NECK N/A 09/20/2015   Procedure: EXCISION MASS NECK;  Surgeon: Beverly Gust, MD;  Location: [place];  Service:  ENT;  Laterality: N/A;  . LAPAROSCOPIC BILATERAL SALPINGO OOPHERECTOMY Bilateral 12/28/2016   Procedure: LAPAROSCOPIC BILATERAL SALPINGO OOPHORECTOMY;  Surgeon: Ward, Honor Loh, MD;  Location: [facility] ORS;   Service: Gynecology;  Laterality: Bilateral;  . LAPAROSCOPIC HYSTERECTOMY N/A 12/28/2016   Procedure: HYSTERECTOMY TOTAL LAPAROSCOPIC;  Surgeon: Ward, Honor Loh, MD;  Location: [facility] ORS;  Service: Gynecology;  Laterality: N/A;  . LAPAROSCOPIC LYSIS OF ADHESIONS  12/28/2016   Procedure: LAPAROSCOPIC EXTENSIVE LYSIS OF ADHESIONS;  Surgeon: Ward, Honor Loh, MD;  Location: [facility] ORS;  Service: Gynecology;;  . rt kidney removed Right   . THYROGLOSSAL DUCT CYST Right 09/20/2015   Procedure: excision sub mandibular gland right;  Surgeon: Beverly Gust, MD;  Location: [place];  Service: ENT;  Laterality: Right;  . THYROID SURGERY Bilateral   . TOTAL KNEE ARTHROPLASTY Right   . TOTAL KNEE ARTHROPLASTY Left 08/09/2014   Procedure: TOTAL KNEE ARTHROPLASTY;  Surgeon: Dereck Leep, MD;  Location: [facility] ORS;  Service: Orthopedics;  Laterality: Left;     FAMILY HISTORY   Family History  Problem Relation Age of Onset  . Breast cancer Maternal Aunt   . Breast cancer Cousin      SOCIAL HISTORY   Social History   Tobacco Use  . Smoking status: Former Smoker    Quit date: 07/29/1994    Years since quitting: 24.7  . Smokeless tobacco: Never Used  Substance Use Topics  . Alcohol use: No  . Drug use: No     MEDICATIONS   Current Medication:  Current Facility-Administered Medications:  .  acetaminophen (TYLENOL) tablet 650 mg, 650 mg, Oral, Q6H PRN, Jennye Boroughs, MD, 325 mg at 04/01/19 1951 .  albuterol (VENTOLIN HFA) 108 (90 Base) MCG/ACT inhaler 2 puff, 2 puff, Inhalation, Q6H PRN, Flora Lipps, MD, 2 puff at 03/26/19 0541 .  Chlorhexidine Gluconate Cloth 2 % PADS 6 each, 6 each, Topical, Daily, Ottie Glazier, MD, 6 each at 04/06/19 0900 .  citalopram (CELEXA) tablet 40 mg, 40 mg, Oral, Daily, Jennye Boroughs, MD, 40 mg at 04/06/19 0910 .  enoxaparin (LOVENOX) injection 40 mg, 40 mg, Subcutaneous, Q24H, Jennye Boroughs, MD, 40 mg at 04/05/19 2200 .  fluticasone (FLONASE) 50  MCG/ACT nasal spray 2 spray, 2 spray, Each Nare, Daily, Jennye Boroughs, MD, 2 spray at 04/06/19 0906 .  insulin aspart (novoLOG) injection 0-5 Units, 0-5 Units, Subcutaneous, QHS, Awilda Bill, NP, 3 Units at 04/05/19 2157 .  insulin aspart (novoLOG) injection 0-9 Units, 0-9 Units, Subcutaneous, TID WC, Dallie Piles, RPH, 3 Units at 04/06/19 1207 .  insulin aspart (novoLOG) injection 4 Units, 4 Units, Subcutaneous, TID WC, Dallie Piles, RPH, 4 Units at 04/06/19 1208 .  insulin detemir (LEVEMIR) injection 20 Units, 20 Units, Subcutaneous, QHS, Loletha Grayer, MD, 20 Units at 04/05/19 2156 .  iron sucrose (VENOFER) 500 mg in sodium chloride 0.9 % 250 mL IVPB, 500 mg, Intravenous, Once, [place], Richard, MD, Last Rate: 68.8 mL/hr at 04/06/19 1447, 500 mg at 04/06/19 1447 .  levothyroxine (SYNTHROID) tablet 175 mcg, 175 mcg, Oral, Q0600, Jennye Boroughs, MD, 175 mcg at 04/06/19 0909 .  protein supplement (ENSURE MAX) liquid, 11 oz, Oral, BID BM, Loletha Grayer, MD, 11 oz at 04/06/19 1448 .  sulfamethoxazole-trimethoprim (BACTRIM) 400-80 MG per tablet 1 tablet, 1 tablet, Oral, Q12H, Ottie Glazier, MD, 1 tablet at 04/06/19 0910    ALLERGIES   Statins    REVIEW OF SYSTEMS     10 point ROS neg except  as per sujective findings  PHYSICAL EXAMINATION   Vital Signs: Temp:  [97.8 F (36.6 C)-97.9 F (36.6 C)] 97.8 F (36.6 C) (02/08 0823) Pulse Rate:  [86-89] 86 (02/08 0823) Resp:  [16-18] 18 (02/08 0823) BP: (117-123)/(41-52) 122/41 (02/08 0823) SpO2:  [86 %-98 %] 98 % (02/08 0823)  GENERAL:well nourished, NAD HEAD: Normocephalic, atraumatic.  EYES: Pupils equal, round, reactive to light.  No scleral icterus.  MOUTH: Moist mucosal membrane. NECK: Supple. No thyromegaly. No nodules. No JVD.  PULMONARY: decreased breath sounds bilaterally CARDIOVASCULAR: S1 and S2. Regular rate and rhythm. No murmurs, rubs, or gallops.  GASTROINTESTINAL: Soft, nontender, non-distended. No  masses. Positive bowel sounds. No hepatosplenomegaly.  MUSCULOSKELETAL: No swelling, clubbing, or edema.  NEUROLOGIC: Mild distress due to acute illness SKIN:intact,warm,dry   PERTINENT DATA     Infusions: . iron sucrose 500 mg (04/06/19 1447)   Scheduled Medications: . Chlorhexidine Gluconate Cloth  6 each Topical Daily  . citalopram  40 mg Oral Daily  . enoxaparin (LOVENOX) injection  40 mg Subcutaneous Q24H  . fluticasone  2 spray Each Nare Daily  . insulin aspart  0-5 Units Subcutaneous QHS  . insulin aspart  0-9 Units Subcutaneous TID WC  . insulin aspart  4 Units Subcutaneous TID WC  . insulin detemir  20 Units Subcutaneous QHS  . levothyroxine  175 mcg Oral Q0600  . Ensure Max Protein  11 oz Oral BID BM  . sulfamethoxazole-trimethoprim  1 tablet Oral Q12H   PRN Medications: acetaminophen, albuterol Hemodynamic parameters:   Intake/Output: No intake/output data recorded.  Ventilator  Settings:      LAB RESULTS:  Basic Metabolic Panel: Recent Labs  Lab 03/31/19 0225 03/31/19 0225 04/01/19 [id number] 04/01/19 [id number] 04/02/19 0315 04/02/19 0315 04/03/19 0351 04/03/19 0351 04/04/19 0708 04/05/19 1242 04/06/19 0110  NA 136   < > 132*  --  132*  --  136  --  133*  --  134*  K 4.3   < > 4.6   < > 4.1   < > 4.1   < > 4.2  --  3.8  CL 99   < > 96*  --  97*  --  99  --  99  --  99  CO2 27   < > 29  --  28  --  26  --  25  --  25  GLUCOSE 66*   < > 190*  --  160*  --  97  --  176*  --  230*  BUN 70*   < > 58*  --  43*  --  41*  --  37*  --  45*  CREATININE 1.18*   < > 1.31*  --  1.15*  --  1.31*  --  1.33*  --  1.73*  CALCIUM 9.0   < > 8.7*  --  8.6*  --  8.7*  --  8.2*  --  7.9*  MG  --   --  1.9  --  1.8  --   --   --   --  1.8  --   PHOS 4.3  --  5.2*  --  3.5  --   --   --   --  4.3  --    < > = values in this interval not displayed.   Liver Function Tests: Recent Labs  Lab 03/31/19 0225 04/02/19 0315  ALBUMIN 2.3* 2.1*   No results for input(s): LIPASE,  AMYLASE in the  last 168 hours. No results for input(s): AMMONIA in the last 168 hours. CBC: Recent Labs  Lab 04/01/19 0554 04/05/19 1242 04/06/19 0110  WBC 15.2* 12.1*  --   NEUTROABS 13.9* 11.0*  --   HGB 9.2* 8.8* 8.0*  HCT 28.8* 27.2*  --   MCV 83.0 82.2  --   PLT 136* 140*  --    Cardiac Enzymes: No results for input(s): CKTOTAL, CKMB, CKMBINDEX, TROPONINI in the last 168 hours. BNP: Invalid input(s): POCBNP CBG: Recent Labs  Lab 04/05/19 1152 04/05/19 1701 04/05/19 2130 04/06/19 0821 04/06/19 1153  GLUCAP 209* 186* 288* 176* 214*     IMAGING RESULTS:  Imaging: No results found.               ASSESSMENT AND PLAN    -Multidisciplinary rounds held today  Acute hypoxemic respiratory failure  - due to COVID19 induced bilateral multifocal pneumonia -continue ISS with ACHS blood sugar checks - PT/OT - walked few feet with desaturation yesterday, will do again today , optimizing for d/c home asap - initiate MetaNEB - patient is out of COVID isolation now -D/c alvesco / duonebs - will keep Albuterol only  -stopped systemic steroids - bronchopulmonary hygiene with albuterol and incentive spirometer -continue to encourage patient participation -Beta-D-Glucan - <31 - normal  -appreciate hospitalist team collaboration -continue to wean down O2 - currently at 4L/min [place] - O2 goal at >80% is acceptable -wbc count mildly elevated without additional steroids, will restart prophylactic antibiotics for secondary prevention of bacterial pneumonia and PCP ppx post high dose prolonged systemic steroids.    Moderate protein calorie malnutrition  - Albumin <2.8 , muscular weakness/deconditioning  - nourishment - continue supplement with meals daily   - 12.5 mg Albumin infusion - 25% daily IV    Multiple comorbid conditions -Hx of RCC, Pancreatic CA, thyroid CA, colon CA -hx of liver cirrosis and CKD - predisposing to hypercoagulable state -currently on  Lovenox PPX dose 40 [place] daily will increase to bid while acutely ill.    Essential HTN   stopped norvasc, bystolic and ibersartan -to decrease nephro and hepatotoxicity while acutely ill.  Monitoring BP closely , thus far well controlled  -HR and BP have been in reference range on this regimen and patient states she is not anxious.    Chronic diastilic CHF - Grade I  - strict I&Os  - gentle diuresis in lieu of CKD  - fluid restriction to 1200cc/24h  -repeat CXR -reviewed today    Hypothyroidism -continue home synthroid   Chronic Renal Failure -follow chem 7 -follow UO -continue PureWick external urinary Catheter-assess need daily -patient has been on diuretic 20 bid Lasix - strict I&Os-now10L net negative -limit PO fluid intake to 1200cc/24h -stop non-essential nephrotoxic medications - d/c protonix      GI/Nutrition GI PROPHYLAXIS as indicated-stopping PPI  DIET-->TF's as tolerated Constipation protocol as indicated  ENDO - ICU hypoglycemic\Hyperglycemia protocol -check FSBS per protocol - currently q4h    ELECTROLYTES -follow labs only as needed -replace as needed -pharmacy consultation   DVT/GI PRX ordered -SCDs  TRANSFUSIONS AS NEEDED MONITOR FSBS ASSESS the need for LABS as needed     This document was prepared using Dragon voice recognition software and may include unintentional dictation errors.    Ottie Glazier, M.D.  Division of [place]

## 2019-04-06 NOTE — Care Management Important Message (Signed)
Important Message  Patient Details  Name: Rebecca Kirby MRN: [id number] Date of Birth: 04-Dec-1945   Medicare Important Message Given:  Yes     Juliann Pulse A Alane Hanssen 04/06/2019, 11:36 AM

## 2019-04-06 NOTE — Progress Notes (Signed)
Physical Therapy Treatment Patient Details Name: Rebecca Kirby MRN: [id number] DOB: [birth year] Today's Date: 04/06/2019    History of Present Illness 74 year old female with a history of chronic anemia, anxiety disorder, history of colon CA, GERD, hypothyroidism renal cell carcinoma history of thyroid CA, pancreatic CA status post Whipple's procedure, diabetes, B TKA, hernia came into the ED due to worsening hypoxemia post COVID-19 infection.   MD assessment includes acute hypoxemic respiratory failure due to COVID19 induced bilateral multifocal pneumonia. Per Dr. Lanney Gins he would like the pulse ox to stay in the 80s with ambulation prior to discharge.    PT Comments    Patient alert, oriented, motivated to work with PT, very eager to go home. Session focused on functional mobility; supine to sit modI, fair balance in sitting for several minutes in prep for OOB activities. Pt on 6L, 89-9% EOB. Two trials of ambulation attempted, first attempt to bathroom ~34ft with CGA, desatted in low 70s. Able to improve with seated rest break and upper body propping to mid-high 80s. Pt performed pericare mod I/supervision. ~89ft from commode to recliner in room, pt desatted to 60s on 6L. Pt able to improve to 70s in 15-20seconds, and up to 91% in a total of almost 2 minutes of rest, returned to 5L via [place] at rest. The patient continues to exhibit limitations in mobility due to cardiopulmonary system. The patient would benefit from further skilled PT intervention to address decreased endurance/activity tolerance, mobility, education, and safety.     Follow Up Recommendations  Home health PT;Supervision for mobility/OOB     Equipment Recommendations  Rolling walker with 5" wheels;3in1 (PT);Wheelchair (measurements PT)    Recommendations for Other Services       Precautions / Restrictions Precautions Precautions: Fall Precaution Comments: watch O2 sats Restrictions Weight Bearing Restrictions:  No Other Position/Activity Restrictions: Per MD notes, OK to participate with PT/OT with SpO2 >80%.    Mobility  Bed Mobility Overal bed mobility: Modified Independent                Transfers Overall transfer level: Needs assistance Equipment used: Rolling walker (2 wheeled) Transfers: Sit to/from Stand Sit to Stand: Modified independent (Device/Increase time) Stand pivot transfers: Supervision;Modified independent (Device/Increase time)       General transfer comment: fair/good LE strength with movement transition; does require UE support for lift off and initial stabilization  Ambulation/Gait Ambulation/Gait assistance: Supervision Gait Distance (Feet): (41ft, 23ft) Assistive device: Rolling walker (2 wheeled) Gait Pattern/deviations: WFL(Within Functional Limits) Gait velocity: decreased   General Gait Details: slow and cautious, no buckling, some noticable fatigue and trembling noted after ambulation trial ea time.   Stairs             Wheelchair Mobility    Modified Rankin (Stroke Patients Only)       Balance Overall balance assessment: Needs assistance Sitting-balance support: Feet supported Sitting balance-Leahy Scale: Good     Standing balance support: Bilateral upper extremity supported Standing balance-Leahy Scale: Good Standing balance comment: NO LOB noted throughout all standing activity                            Cognition Arousal/Alertness: Awake/alert Behavior During Therapy: WFL for tasks assessed/performed Overall Cognitive Status: Within Functional Limits for tasks assessed  Exercises Other Exercises Other Exercises: Toilet transfer, ambulatory with RW, sup; sit/stand from [facility] with RW, sup/mod indep able to perform pericare as well. Other Exercises: verbal and visual cues for PLB and brief rest breaks to support breath recovery    General Comments General  comments (skin integrity, edema, etc.): on 6L throughout mobility      Pertinent Vitals/Pain      Home Living                      Prior Function            PT Goals (current goals can now be found in the care plan section) Progress towards PT goals: Progressing toward goals    Frequency    Min 2X/week      PT Plan Current plan remains appropriate    Co-evaluation              AM-PAC PT "6 Clicks" Mobility   Outcome Measure  Help needed turning from your back to your side while in a flat bed without using bedrails?: None Help needed moving from lying on your back to sitting on the side of a flat bed without using bedrails?: None Help needed moving to and from a bed to a chair (including a wheelchair)?: None Help needed standing up from a chair using your arms (e.g., wheelchair or bedside chair)?: None Help needed to walk in hospital room?: None Help needed climbing 3-5 steps with a railing? : A Little 6 Click Score: 23    End of Session Equipment Utilized During Treatment: Oxygen;Other (comment)(6L) Activity Tolerance: Treatment limited secondary to medical complications (Comment);Other (comment)(desats to 60s-70s with exertion) Patient left: in chair;with chair alarm set;with call bell/phone within reach Nurse Communication: Mobility status PT Visit Diagnosis: Difficulty in walking, not elsewhere classified (R26.2);Muscle weakness (generalized) (M62.81)     Time: [id number] PT Time Calculation (min) (ACUTE ONLY): 32 min  Charges:  $Therapeutic Exercise: 8-22 mins $Therapeutic Activity: 8-22 mins                    Lieutenant Diego PT, DPT 1:18 PM,04/06/19

## 2019-04-07 LAB — CBC
HCT: 25 % — ABNORMAL LOW (ref 36.0–46.0)
Hemoglobin: 8.1 g/dL — ABNORMAL LOW (ref 12.0–15.0)
MCH: 26.8 pg (ref 26.0–34.0)
MCHC: 32.4 g/dL (ref 30.0–36.0)
MCV: 82.8 fL (ref 80.0–100.0)
Platelets: 155 10*3/uL (ref 150–400)
RBC: 3.02 MIL/uL — ABNORMAL LOW (ref 3.87–5.11)
RDW: 14.6 % (ref 11.5–15.5)
WBC: 8 10*3/uL (ref 4.0–10.5)
nRBC: 0 % (ref 0.0–0.2)

## 2019-04-07 LAB — BASIC METABOLIC PANEL
Anion gap: 12 (ref 5–15)
BUN: 38 mg/dL — ABNORMAL HIGH (ref 8–23)
CO2: 24 mmol/L (ref 22–32)
Calcium: 8.1 mg/dL — ABNORMAL LOW (ref 8.9–10.3)
Chloride: 102 mmol/L (ref 98–111)
Creatinine, Ser: 1.58 mg/dL — ABNORMAL HIGH (ref 0.44–1.00)
GFR calc Af Amer: 37 mL/min — ABNORMAL LOW (ref >60)
GFR calc non Af Amer: 32 mL/min — ABNORMAL LOW (ref >60)
Glucose, Bld: 163 mg/dL — ABNORMAL HIGH (ref 70–99)
Potassium: 4.3 mmol/L (ref 3.5–5.1)
Sodium: 138 mmol/L (ref 135–145)

## 2019-04-07 LAB — TYPE AND SCREEN
ABO/RH(D): A POS
Antibody Screen: NEGATIVE

## 2019-04-07 LAB — GLUCOSE, CAPILLARY
Glucose-Capillary: 108 mg/dL — ABNORMAL HIGH (ref 70–99)
Glucose-Capillary: 215 mg/dL — ABNORMAL HIGH (ref 70–99)

## 2019-04-07 MED ORDER — FERROUS SULFATE 325 (65 FE) MG PO TABS: 325.0000 mg | ORAL_TABLET | Freq: Every day | ORAL | 0 refills | Status: DC

## 2019-04-07 MED ORDER — ENSURE MAX PROTEIN PO LIQD: 11.0000 [oz_av] | Freq: Two times a day (BID) | ORAL | 0 refills | Status: DC

## 2019-04-07 MED ORDER — HUMALOG KWIKPEN 100 UNIT/ML ~~LOC~~ SOPN: 8.0000 [IU] | PEN_INJECTOR | Freq: Three times a day (TID) | SUBCUTANEOUS | 11 refills | Status: DC

## 2019-04-07 MED ORDER — TRESIBA FLEXTOUCH 200 UNIT/ML ~~LOC~~ SOPN: 25.0000 [IU] | PEN_INJECTOR | Freq: Every evening | SUBCUTANEOUS | Status: DC

## 2019-04-07 MED ORDER — ALBUTEROL SULFATE HFA 108 (90 BASE) MCG/ACT IN AERS: 1.0000 | INHALATION_SPRAY | Freq: Four times a day (QID) | RESPIRATORY_TRACT | 0 refills | Status: DC | PRN

## 2019-04-07 MED ORDER — INSULIN ASPART 100 UNIT/ML ~~LOC~~ SOLN
8.0000 [IU] | Freq: Three times a day (TID) | SUBCUTANEOUS | Status: DC
  Administered 2019-04-07 (×2): 8 [IU] via SUBCUTANEOUS
  Filled 2019-04-07 (×2): qty 1

## 2019-04-07 NOTE — Discharge Summary (Signed)
[facility] NAME: Rebecca Kirby    MR#:  [id number]  DATE OF BIRTH:  [birth year]  DATE OF ADMISSION:  03/19/2019 ADMITTING PHYSICIAN: Jennye Boroughs, MD  DATE OF DISCHARGE: 04/07/2019  PRIMARY CARE PHYSICIAN: Idelle Crouch, MD    ADMISSION DIAGNOSIS:  Acute respiratory failure with hypoxia ([place]) [J96.01] Pneumonia due to COVID-19 virus [U07.1, J12.82] COVID-19 [U07.1]  DISCHARGE DIAGNOSIS:  Principal Problem:   Pneumonia due to COVID-19 virus Active Problems:   Iron deficiency anemia   Cirrhosis ([place])   Cirrhosis of liver without ascites (H[facility])   Cancer of right kidney (H[facility])   Acute hypoxemic respiratory failure due to COVID-19 ([place])   Hyponatremia   Weakness   Acute kidney injury superimposed on CKD ([place])   Hypothyroidism   Renal cell carcinoma ([place])   SECONDARY DIAGNOSIS:   Past Medical History:  Diagnosis Date  . Anemia   . Anxiety   . Colon cancer ([place])   . Diabetes mellitus without complication ([place])   . Family history of adverse reaction to anesthesia    daughter had reaction to anesthesia and was in icu  . GERD (gastroesophageal reflux disease)   . Hypertension   . Hypothyroidism   . Kidney cancer, primary, with metastasis from kidney to other site [facility]) 08/26/2014   right  . Pancreas cancer ([place]) 2018  . Thyroid cancer [facility])     HOSPITAL COURSE:   1.  Acute hypoxic respiratory failure secondary to COVID-19 infection and severe ARDS and COVID-19 pneumonia.  The patient was here in the hospital long time and admitted on 03/19/2019.  Patient received the remdesivir and steroids for COVID-19.  Patient was on high flow nasal cannula most of the hospital stay.  We have been watching the patient for a few days now on 4 L of regular nasal cannula at rest and 6 L with ambulation.  The patient does desaturate into the 70s with ambulation but does recover quickly.  She goes back into the 80s then into the low 90s.   Case discussed with pulmonary Dr. Lanney Gins and he is comfortable with her going home today.  I know she will desaturate with movement but I do want her to continue moving around.  Home health set up and home oxygen set up.  Do not have to go overboard with sending the patient back to the hospital with low saturations that recover quickly. 2.  Acute kidney injury on chronic kidney disease stage IIIa.  The patient was diuresed with Lasix to try to get rid of fluid and get better oxygenation but her kidney function impaired up to creatinine of 1.73.  Upon discharge creatinine is down to 1.58 and I think it will recover further with holding Lasix.  Her lowest creatinine during the hospitalization was 1.12 which is still chronic kidney disease stage IIIa.  Recommend checking a follow-up BMP with follow-up appointment. 3.  Iron deficiency anemia.  I gave a large dose of IV Venofer yesterday.  Hemoglobin 8.1 upon discharge.  Patient states that she has been anemic and has received iron infusions in the past.  Will prescribe ferrous sulfate upon discharge home.  Patient has not seen any signs of bleeding. 4.  Weakness.  Patient doing a little bit better on a daily basis with physical therapy. 5.  History of metastatic renal cell carcinoma 6.  Type 2 diabetes mellitus.  Hemoglobin A1c 7.4.  Can go back on her Tresiba insulin at  lower dose 26 units at night and Humalog prior to meals 8 units. 7.  Hypothyroidism unspecified on levothyroxine 8.  Patient was treated for thrush during the hospital stay  DISCHARGE CONDITIONS:   Fair  CONSULTS OBTAINED:  Treatment Team:  Ottie Glazier, MD  DRUG ALLERGIES:   Allergies  Allergen Reactions  . Statins     Cirrhosis, unable to tolerate    DISCHARGE MEDICATIONS:   Allergies as of 04/07/2019      Reactions   Statins    Cirrhosis, unable to tolerate      Medication List    STOP taking these medications   amLODipine-olmesartan 5-20 MG tablet Commonly  known as: AZOR   azithromycin 250 MG tablet Commonly known as: ZITHROMAX   calcium acetate 667 MG capsule Commonly known as: PHOSLO   fexofenadine 180 MG tablet Commonly known as: ALLEGRA   HYDROcodone-acetaminophen 10-325 MG tablet Commonly known as: NORCO   metFORMIN 500 MG 24 hr tablet Commonly known as: GLUCOPHAGE-XR   nebivolol 5 MG tablet Commonly known as: BYSTOLIC   ondansetron 4 MG tablet Commonly known as: ZOFRAN   predniSONE 10 MG tablet Commonly known as: DELTASONE   Vitamin D-3 25 MCG (1000 UT) Caps     TAKE these medications   acetaminophen 500 MG tablet Commonly known as: TYLENOL Take 500-1,000 mg by mouth 2 (two) times daily.   albuterol 108 (90 Base) MCG/ACT inhaler Commonly known as: VENTOLIN HFA Inhale 1 puff into the lungs every 6 (six) hours as needed for wheezing or shortness of breath.   citalopram 40 MG tablet Commonly known as: CELEXA Take 40 mg by mouth daily.   Ensure Max Protein Liqd Take 330 mLs (11 oz total) by mouth 2 (two) times daily between meals.   ferrous sulfate 325 (65 FE) MG tablet Take 1 tablet (325 mg total) by mouth daily.   fluticasone 50 MCG/ACT nasal spray Commonly known as: FLONASE Place 2 sprays into both nostrils daily.   HumaLOG KwikPen 100 UNIT/ML KwikPen Generic drug: insulin lispro Inject 0.08 mLs (8 Units total) into the skin 3 (three) times daily. Per sliding scale What changed: how much to take   levothyroxine 175 MCG tablet Commonly known as: SYNTHROID Take 175 mcg by mouth daily before breakfast.   pantoprazole 40 MG tablet Commonly known as: PROTONIX Take 40 mg by mouth daily.   Tyler Aas FlexTouch 200 UNIT/ML Sopn Generic drug: Insulin Degludec Inject 26 Units into the skin every evening. What changed: how much to take   vitamin B-12 1000 MCG tablet Commonly known as: CYANOCOBALAMIN Take 1,000 mcg by mouth daily. Reported on 07/07/2015            Durable Medical Equipment  (From  admission, onward)         Start     Ordered   04/05/19 1120  For home use only DME lightweight manual wheelchair with seat cushion  Once    Comments: Patient suffers from acute hypoxic respiratory failure secondary to covid 19 which impairs their ability to perform daily activities like walking in the home.  A walker will not resolve  issue with performing activities of daily living. A wheelchair will allow patient to safely perform daily activities. Patient is not able to propel themselves in the home using a standard weight wheelchair due to hypoxia with ambulating. Patient can self propel in the lightweight wheelchair. Length of need 1 year at this point Accessories: elevating leg rests (ELRs), wheel locks, extensions and anti-tippers.  04/05/19 1121   04/05/19 1119  For home use only DME oxygen  Once    Comments: 4 liters at rest and 6 with ambulation  Question Answer Comment  Length of Need 12 Months   Mode or (Route) Nasal cannula   Liters per Minute 4   Frequency Continuous (stationary and portable oxygen unit needed)   Oxygen conserving device Yes   Oxygen delivery system Gas      04/05/19 1121           DISCHARGE INSTRUCTIONS:   Follow-up PMD 5 days Follow-up Dr. Lanney Gins of 1 week  If you experience worsening of your admission symptoms, develop shortness of breath, life threatening emergency, suicidal or homicidal thoughts you must seek medical attention immediately by calling 911 or calling your MD immediately  if symptoms less severe.  You Must read complete instructions/literature along with all the possible adverse reactions/side effects for all the Medicines you take and that have been prescribed to you. Take any new Medicines after you have completely understood and accept all the possible adverse reactions/side effects.   Please note  You were cared for by a hospitalist during your hospital stay. If you have any questions about your discharge medications or  the care you received while you were in the hospital after you are discharged, you can call the unit and asked to speak with the hospitalist on call if the hospitalist that took care of you is not available. Once you are discharged, your primary care physician will handle any further medical issues. Please note that NO REFILLS for any discharge medications will be authorized once you are discharged, as it is imperative that you return to your primary care physician (or establish a relationship with a primary care physician if you do not have one) for your aftercare needs so that they can reassess your need for medications and monitor your lab values.    Today   CHIEF COMPLAINT:   Chief Complaint  Patient presents with  . Shortness of Breath    HISTORY OF PRESENT ILLNESS:  Raeanna Mirabelli  is a 74 y.o. female presented with shortness of breath and found to have COVID-19 infection   VITAL SIGNS:  Blood pressure (!) 117/45, pulse 72, temperature 98.2 F (36.8 C), temperature source Oral, resp. rate 18, height 5\' 5"  (1.651 m), weight 98.3 kg, SpO2 (!) 89 %.  I/O:    Intake/Output Summary (Last 24 hours) at 04/07/2019 1221 Last data filed at 04/07/2019 0900 Gross per 24 hour  Intake 721.23 ml  Output 950 ml  Net -228.77 ml    PHYSICAL EXAMINATION:  GENERAL:  74 y.o.-year-old patient lying in the bed with no acute distress.  EYES: Pupils equal, round, reactive to light and accommodation. HEENT: Head atraumatic, normocephalic. Oropharynx and nasopharynx clear.  NECK:  Supple, no jugular venous distention. No thyroid enlargement, no tenderness.  LUNGS: Decreased breath sounds bilateral bases, no wheezing, rales,rhonchi or crepitation. No use of accessory muscles of respiration.  CARDIOVASCULAR: S1, S2 normal. No murmurs, rubs, or gallops.  ABDOMEN: Soft, non-tender, non-distended. Bowel sounds present. No organomegaly or mass.  EXTREMITIES: Trace pedal edema, no cyanosis, or clubbing.   NEUROLOGIC: Cranial nerves II through XII are intact. Muscle strength 5/5 in all extremities. Sensation intact. Gait not checked.  PSYCHIATRIC: The patient is alert and oriented x 3.  SKIN: No obvious rash, lesion, or ulcer.   DATA REVIEW:   CBC Recent Labs  Lab 04/07/19 0341  WBC 8.0  HGB 8.1*  HCT 25.0*  PLT 155    Chemistries  Recent Labs  Lab 04/05/19 1242 04/06/19 0110 04/07/19 0341  NA  --    < > 138  K  --    < > 4.3  CL  --    < > 102  CO2  --    < > 24  GLUCOSE  --    < > 163*  BUN  --    < > 38*  CREATININE  --    < > 1.58*  CALCIUM  --    < > 8.1*  MG 1.8  --   --    < > = values in this interval not displayed.     Microbiology Results  Results for orders placed or performed during the hospital encounter of 03/19/19  Blood culture (routine x 2)     Status: None   Collection Time: 03/19/19  3:01 PM   Specimen: BLOOD  Result Value Ref Range Status   Specimen Description BLOOD BLOOD LEFT FOREARM  Final   Special Requests   Final    BOTTLES DRAWN AEROBIC AND ANAEROBIC Blood Culture adequate volume   Culture   Final    NO GROWTH 5 DAYS Performed at [facility], [address] [place], [place] [address]    Report Status 03/24/2019 FINAL  Final  Blood culture (routine x 2)     Status: None   Collection Time: 03/19/19  3:01 PM   Specimen: BLOOD  Result Value Ref Range Status   Specimen Description BLOOD LEFT ANTECUBITAL  Final   Special Requests   Final    BOTTLES DRAWN AEROBIC AND ANAEROBIC Blood Culture adequate volume   Culture   Final    NO GROWTH 5 DAYS Performed at [facility], [address] [place], [place] [address]    Report Status 03/24/2019 FINAL  Final  MRSA PCR Screening     Status: None   Collection Time: 03/20/19 11:05 AM   Specimen: Nasopharyngeal  Result Value Ref Range Status   MRSA by PCR NEGATIVE NEGATIVE Final    Comment:        The GeneXpert MRSA Assay (FDA approved for NASAL specimens only), is  one component of a comprehensive MRSA colonization surveillance program. It is not intended to diagnose MRSA infection nor to guide or monitor treatment for MRSA infections. Performed at [facility], [address] [place], [place] [address]   Urine Culture     Status: Abnormal (Preliminary result)   Collection Time: 04/01/19  2:22 PM   Specimen: Urine, Clean Catch  Result Value Ref Range Status   Specimen Description   Final    URINE, CLEAN CATCH Performed at [facility], [address], [place], [place] [address]    Special Requests   Final    NONE Performed at [facility], [address] [place], [place] [address]    Culture MULTIPLE SPECIES PRESENT, SUGGEST RECOLLECTION (A)  Final   Report Status PENDING  Incomplete    RADIOLOGY:  DG Chest Port 1 View  Result Date: 04/06/2019 CLINICAL DATA:  COVID pneumonia. Pulmonary disease. EXAM: PORTABLE CHEST 1 VIEW COMPARISON:  04/03/2019 and 04/01/2019 and 03/19/2019 FINDINGS: The extensive bilateral pulmonary infiltrates persist, slightly progressed in the left perihilar region. No other change. Heart size and vascularity are normal. No effusions. No bone abnormality. IMPRESSION: Persistent bilateral pulmonary infiltrates, slightly progressed in the left perihilar region. Electronically Signed  By: Lorriane Shire M.D.   On: 04/06/2019 15:27     Management plans discussed with the patient, family and they are in agreement.  CODE STATUS:     Code Status Orders  (From admission, onward)         Start     Ordered   03/19/19 1526  Full code  Continuous     03/19/19 1525        Code Status History    Date Active Date Inactive Code Status Order ID Comments User Context   08/09/2014 1247 08/12/2014 1353 Full Code [id number]  Dereck Leep, MD Inpatient   Advance Care Planning Activity    Advance Directive Documentation     Most Recent Value  Type of Advance Directive  Healthcare Power of  Attorney  Pre-existing out of facility DNR order (yellow form or pink MOST form)  --  "MOST" Form in Place?  --      TOTAL TIME TAKING CARE OF THIS PATIENT: 34 minutes.    Loletha Grayer M.D on 04/07/2019 at 12:21 PM  Between 7am to 6pm - Pager - [phone]  After 6pm go to [email or web address] - password [email or web address]  [facility]  CC: Primary care physician; Idelle Crouch, MD

## 2019-04-07 NOTE — Progress Notes (Signed)
PULMONARY PROGRESS NOTE    Name: Rebecca Kirby MRN: [id number] DOB: September 03, 1945     LOS: 19   SUBJECTIVE FINDINGS & SIGNIFICANT EVENTS   Patient description:   This is a very pleasant 74 year old female with complex comorbid history s/p COVID 19 pneumonitis with acute hypoxemic respiratory failure   Lines / Drains: PIVx2  Cultures / Sepsis markers: Respiratory culture -in progress MRSA PCT - negative Blood culture x2 - NTDx24h -reincubation in progress  Antimicrobials: zithromax 500 PO daily -finished CAP regimen Vecluri x 5 days per protocol- finished Ivermectin 9mg  po daily- d/cd 6 days ago  Protocols / Consultants: PCCM Hospitalist  Tests / Events: [place] - Lower extermities - negative for DVT bilaterally  04/01/19- transferred to medical floor from SDU/MICU 04/02/19 - patient on 5L/min [place] she lucid, able to take 424ml tidal volumes on incentive spirometer, good hand strength and ROMx4, was having breakfast without assistance during my evaluation in am, lung auscultation with mild crackles worse on right. Care plan discussed with daughter at bedside.   04/03/19 -case discussed with PT plan for hallway exertional walk today, discussed with primary attending will plan for possible d/c home tommorow if continues to improve today with decreased O2 req.  Discusssed with [employer] - O2 concentrator ready for home d/c.  Reviewed CXR today -as per radiologist improved from previous.  04/04/19- Patient appears clinically improved, she had BM well formed, she is on 4L , she speaks in full sentences sat up and got out of bed without assistance , walked to bathroom by herself with walker 04/04/19- patient frustrated as she wishes to be discharged home, she is overal improved but requiring 4L/min at rest and 6 with  exertion.  PT discharge eval today. Discussed with Dr Earleen Newport and daughter Rebecca Kirby today.  04/06/19-patient is improved.  She is working better with PT and is being optimized for discharge home to family.  IV venofer due to chronic recurrent anemia.  04/07/19-patient further improved, she did well with PT having very short time intervals with desats between exertion. She is now cleared to be discharged with home PT and home health and supplemental O2.   PAST MEDICAL HISTORY   Past Medical History:  Diagnosis Date  . Anemia   . Anxiety   . Colon cancer ([place])   . Diabetes mellitus without complication ([place])   . Family history of adverse reaction to anesthesia    daughter had reaction to anesthesia and was in icu  . GERD (gastroesophageal reflux disease)   . Hypertension   . Hypothyroidism   . Kidney cancer, primary, with metastasis from kidney to other site [facility]) 08/26/2014   right  . Pancreas cancer ([place]) 2018  . Thyroid cancer [facility])      SURGICAL HISTORY   Past Surgical History:  Procedure Laterality Date  . CHOLECYSTECTOMY    . COLON RESECTION    . COLONOSCOPY WITH PROPOFOL N/A 09/01/2015   Procedure: COLONOSCOPY WITH PROPOFOL;  Surgeon: Manya Silvas, MD;  Location[facility] ENDOSCOPY;  Service: Endoscopy;  Laterality: N/A;  . CYSTOSCOPY  12/28/2016   Procedure: CYSTOSCOPY;  Surgeon: Ward, Honor Loh, MD;  Location: [facility] ORS;  Service: Gynecology;;  . ESOPHAGOGASTRODUODENOSCOPY (EGD) WITH PROPOFOL N/A 09/01/2015   Procedure: ESOPHAGOGASTRODUODENOSCOPY (EGD) WITH PROPOFOL;  Surgeon: Manya Silvas, MD;  Location: [facility] ENDOSCOPY;  Service: Endoscopy;  Laterality: N/A;  . EUS N/A 06/28/2016   Procedure: FULL UPPER ENDOSCOPIC ULTRASOUND (EUS) RADIAL;  Surgeon: Jola Schmidt, MD;  Location: [facility] ENDOSCOPY;  Service: Endoscopy;  Laterality: N/A;  . EXCISION MASS NECK N/A 09/20/2015   Procedure: EXCISION MASS NECK;  Surgeon: Beverly Gust, MD;  Location: [place];  Service: ENT;   Laterality: N/A;  . LAPAROSCOPIC BILATERAL SALPINGO OOPHERECTOMY Bilateral 12/28/2016   Procedure: LAPAROSCOPIC BILATERAL SALPINGO OOPHORECTOMY;  Surgeon: Ward, Honor Loh, MD;  Location: [facility] ORS;  Service: Gynecology;  Laterality: Bilateral;  . LAPAROSCOPIC HYSTERECTOMY N/A 12/28/2016   Procedure: HYSTERECTOMY TOTAL LAPAROSCOPIC;  Surgeon: Ward, Honor Loh, MD;  Location: [facility] ORS;  Service: Gynecology;  Laterality: N/A;  . LAPAROSCOPIC LYSIS OF ADHESIONS  12/28/2016   Procedure: LAPAROSCOPIC EXTENSIVE LYSIS OF ADHESIONS;  Surgeon: Ward, Honor Loh, MD;  Location: [facility] ORS;  Service: Gynecology;;  . rt kidney removed Right   . THYROGLOSSAL DUCT CYST Right 09/20/2015   Procedure: excision sub mandibular gland right;  Surgeon: Beverly Gust, MD;  Location: [place];  Service: ENT;  Laterality: Right;  . THYROID SURGERY Bilateral   . TOTAL KNEE ARTHROPLASTY Right   . TOTAL KNEE ARTHROPLASTY Left 08/09/2014   Procedure: TOTAL KNEE ARTHROPLASTY;  Surgeon: Dereck Leep, MD;  Location: [facility] ORS;  Service: Orthopedics;  Laterality: Left;     FAMILY HISTORY   Family History  Problem Relation Age of Onset  . Breast cancer Maternal Aunt   . Breast cancer Cousin      SOCIAL HISTORY   Social History   Tobacco Use  . Smoking status: Former Smoker    Quit date: 07/29/1994    Years since quitting: 24.7  . Smokeless tobacco: Never Used  Substance Use Topics  . Alcohol use: No  . Drug use: No     MEDICATIONS   Current Medication:  Current Facility-Administered Medications:  .  acetaminophen (TYLENOL) tablet 650 mg, 650 mg, Oral, Q6H PRN, Jennye Boroughs, MD, 650 mg at 04/06/19 2236 .  albuterol (VENTOLIN HFA) 108 (90 Base) MCG/ACT inhaler 2 puff, 2 puff, Inhalation, Q6H PRN, Flora Lipps, MD, 2 puff at 03/26/19 0541 .  Chlorhexidine Gluconate Cloth 2 % PADS 6 each, 6 each, Topical, Daily, Ottie Glazier, MD, 6 each at 04/06/19 0900 .  citalopram (CELEXA) tablet 40 mg, 40 mg,  Oral, Daily, Jennye Boroughs, MD, 40 mg at 04/07/19 0858 .  enoxaparin (LOVENOX) injection 40 mg, 40 mg, Subcutaneous, Q24H, Jennye Boroughs, MD, 40 mg at 04/06/19 2108 .  fluticasone (FLONASE) 50 MCG/ACT nasal spray 2 spray, 2 spray, Each Nare, Daily, Jennye Boroughs, MD, 2 spray at 04/07/19 0859 .  insulin aspart (novoLOG) injection 0-5 Units, 0-5 Units, Subcutaneous, QHS, Awilda Bill, NP, 4 Units at 04/06/19 2108 .  insulin aspart (novoLOG) injection 0-9 Units, 0-9 Units, Subcutaneous, TID WC, Dallie Piles, RPH, 5 Units at 04/06/19 1730 .  insulin aspart (novoLOG) injection 8 Units, 8 Units, Subcutaneous, TID WC, Loletha Grayer, MD, 8 Units at 04/07/19 0857 .  insulin detemir (LEVEMIR) injection 20 Units, 20 Units, Subcutaneous, QHS, Loletha Grayer, MD, 20 Units at 04/06/19 2106 .  levothyroxine (SYNTHROID) tablet 175 mcg, 175 mcg, Oral, Q0600, Jennye Boroughs, MD, 175 mcg at 04/07/19 0858 .  protein supplement (ENSURE MAX) liquid, 11 oz, Oral, BID BM, Loletha Grayer, MD, 11 oz at 04/06/19 1448 .  sulfamethoxazole-trimethoprim (BACTRIM) 400-80 MG per tablet 1 tablet, 1 tablet, Oral, Q12H, Ottie Glazier, MD, 1 tablet at 04/07/19 0858    ALLERGIES   Statins    REVIEW OF SYSTEMS     10 point ROS neg except as  per sujective findings  PHYSICAL EXAMINATION   Vital Signs: Temp:  [98.1 F (36.7 C)-98.4 F (36.9 C)] 98.2 F (36.8 C) (02/09 0759) Pulse Rate:  [72-82] 72 (02/09 0759) Resp:  [16-18] 18 (02/09 0759) BP: (117-137)/(45-53) 117/45 (02/09 0759) SpO2:  [89 %-95 %] 89 % (02/09 0759)  GENERAL:well nourished, NAD HEAD: Normocephalic, atraumatic.  EYES: Pupils equal, round, reactive to light.  No scleral icterus.  MOUTH: Moist mucosal membrane. NECK: Supple. No thyromegaly. No nodules. No JVD.  PULMONARY: ctab with mild rhonchi thrgouthout CARDIOVASCULAR: S1 and S2. Regular rate and rhythm. No murmurs, rubs, or gallops.  GASTROINTESTINAL: Soft, nontender,  non-distended. No masses. Positive bowel sounds. No hepatosplenomegaly.  MUSCULOSKELETAL: No swelling, clubbing, or edema.  NEUROLOGIC: Mild distress due to acute illness SKIN:intact,warm,dry   PERTINENT DATA     Infusions:  Scheduled Medications: . Chlorhexidine Gluconate Cloth  6 each Topical Daily  . citalopram  40 mg Oral Daily  . enoxaparin (LOVENOX) injection  40 mg Subcutaneous Q24H  . fluticasone  2 spray Each Nare Daily  . insulin aspart  0-5 Units Subcutaneous QHS  . insulin aspart  0-9 Units Subcutaneous TID WC  . insulin aspart  8 Units Subcutaneous TID WC  . insulin detemir  20 Units Subcutaneous QHS  . levothyroxine  175 mcg Oral Q0600  . Ensure Max Protein  11 oz Oral BID BM  . sulfamethoxazole-trimethoprim  1 tablet Oral Q12H   PRN Medications: acetaminophen, albuterol Hemodynamic parameters:   Intake/Output: 02/08 0701 - 02/09 0700 In: 841.2 [P.O.:630; IV Piggyback:211.2] Out: 1000 [Urine:1000]  Ventilator  Settings:      LAB RESULTS:  Basic Metabolic Panel: Recent Labs  Lab 04/01/19 0554 04/01/19 0554 04/02/19 0315 04/02/19 0315 04/03/19 0351 04/03/19 0351 04/04/19 0708 04/04/19 0708 04/05/19 1242 04/06/19 0110 04/07/19 0341  NA 132*   < > 132*  --  136  --  133*  --   --  134* 138  K 4.6   < > 4.1   < > 4.1   < > 4.2   < >  --  3.8 4.3  CL 96*   < > 97*  --  99  --  99  --   --  99 102  CO2 29   < > 28  --  26  --  25  --   --  25 24  GLUCOSE 190*   < > 160*  --  97  --  176*  --   --  230* 163*  BUN 58*   < > 43*  --  41*  --  37*  --   --  45* 38*  CREATININE 1.31*   < > 1.15*  --  1.31*  --  1.33*  --   --  1.73* 1.58*  CALCIUM 8.7*   < > 8.6*  --  8.7*  --  8.2*  --   --  7.9* 8.1*  MG 1.9  --  1.8  --   --   --   --   --  1.8  --   --   PHOS 5.2*  --  3.5  --   --   --   --   --  4.3  --   --    < > = values in this interval not displayed.   Liver Function Tests: Recent Labs  Lab 04/02/19 0315  ALBUMIN 2.1*   No results  for input(s): LIPASE, AMYLASE in the  last 168 hours. No results for input(s): AMMONIA in the last 168 hours. CBC: Recent Labs  Lab 04/01/19 0554 04/05/19 1242 04/06/19 0110 04/07/19 0341  WBC 15.2* 12.1*  --  8.0  NEUTROABS 13.9* 11.0*  --   --   HGB 9.2* 8.8* 8.0* 8.1*  HCT 28.8* 27.2*  --  25.0*  MCV 83.0 82.2  --  82.8  PLT 136* 140*  --  155   Cardiac Enzymes: No results for input(s): CKTOTAL, CKMB, CKMBINDEX, TROPONINI in the last 168 hours. BNP: Invalid input(s): POCBNP CBG: Recent Labs  Lab 04/06/19 0821 04/06/19 1153 04/06/19 1655 04/06/19 2054 04/07/19 0756  GLUCAP 176* 214* 261* 340* 108*     IMAGING RESULTS:  Imaging: DG Chest Port 1 View  Result Date: 04/06/2019 CLINICAL DATA:  COVID pneumonia. Pulmonary disease. EXAM: PORTABLE CHEST 1 VIEW COMPARISON:  04/03/2019 and 04/01/2019 and 03/19/2019 FINDINGS: The extensive bilateral pulmonary infiltrates persist, slightly progressed in the left perihilar region. No other change. Heart size and vascularity are normal. No effusions. No bone abnormality. IMPRESSION: Persistent bilateral pulmonary infiltrates, slightly progressed in the left perihilar region. Electronically Signed   By: Lorriane Shire M.D.   On: 04/06/2019 15:27                 ASSESSMENT AND PLAN    -Multidisciplinary rounds held today  Acute hypoxemic respiratory failure  - due to COVID19 induced bilateral multifocal pneumonia -continue ISS with ACHS blood sugar checks - PT/OT - improvement in desaturation with exertion  - initiate MetaNEB - patient is out of COVID isolation now -D/c alvesco / duonebs - will keep Albuterol only  -stopped systemic steroids - bronchopulmonary hygiene with albuterol and incentive spirometer -continue to encourage patient participation -Beta-D-Glucan - <31 - normal  -appreciate hospitalist team collaboration -continue to wean down O2 - currently at 4L/min [place] - O2 goal at >80% is acceptable -wbc  count elevation has resolved - antibiotics stopped.    Moderate protein calorie malnutrition  - Albumin <2.8 , muscular weakness/deconditioning  - nourishment - continue supplement with meals daily   - 12.5 mg Albumin infusion - 25% daily IV    Severe IDA  s/p Venofer - now rerports clinical improvement  Multiple comorbid conditions -Hx of RCC, Pancreatic CA, thyroid CA, colon CA -hx of liver cirrosis and CKD - predisposing to hypercoagulable state -currently on Lovenox PPX dose 40 [place] daily will increase to bid while acutely ill.    Essential HTN   stopped norvasc, bystolic and ibersartan -to decrease nephro and hepatotoxicity while acutely ill.  Monitoring BP closely , thus far well controlled  -HR and BP have been in reference range on this regimen and patient states she is not anxious.    Chronic diastilic CHF - Grade I  - strict I&Os  - gentle diuresis in lieu of CKD  - fluid restriction to 1200cc/24h  -repeat CXR -reviewed today    Hypothyroidism -continue home synthroid   Chronic Renal Failure -follow chem 7 -follow UO -continue PureWick external urinary Catheter-assess need daily -patient has been on diuretic 20 bid Lasix - strict I&Os-now10L net negative -limit PO fluid intake to 1200cc/24h -stop non-essential nephrotoxic medications - d/c protonix      GI/Nutrition GI PROPHYLAXIS as indicated-stopping PPI  DIET-->TF's as tolerated Constipation protocol as indicated  ENDO - ICU hypoglycemic\Hyperglycemia protocol -check FSBS per protocol - currently q4h    ELECTROLYTES -follow labs only as needed -replace as needed -pharmacy consultation  DVT/GI PRX ordered -SCDs  TRANSFUSIONS AS NEEDED MONITOR FSBS ASSESS the need for LABS as needed     This document was prepared using Dragon voice recognition software and may include unintentional dictation errors.    Ottie Glazier, M.D.  Division of [place]

## 2019-04-07 NOTE — Progress Notes (Signed)
Physical Therapy Treatment Patient Details Name: Rebecca Kirby MRN: [id number] DOB: 05/27/1945 Today's Date: 04/07/2019    History of Present Illness 74 year old female with a history of chronic anemia, anxiety disorder, history of colon CA, GERD, hypothyroidism renal cell carcinoma history of thyroid CA, pancreatic CA status post Whipple's procedure, diabetes, B TKA, hernia came into the ED due to worsening hypoxemia post COVID-19 infection.   MD assessment includes acute hypoxemic respiratory failure due to COVID19 induced bilateral multifocal pneumonia. Per Dr. Lanney Gins he would like the pulse ox to stay in the 80s with ambulation prior to discharge.    PT Comments    Pt resting in bed upon arriving. " I just want to go home." Upon arriving, pt on 4 L o2 with sao2 92%.  Drops to 82% with sitting up EOB. Increased O2 to 6 L throughout remainder of session. Pt was able to ambulated from recliner to doorway and return with RW 3 x with supervision. Extended rest between trials. Pt demonstrates safe gait without any loss of balance or unsteadiness however continues to desat to low 70s. She requires ~ 2-4 minutes between gait trials to allow O2 to return to 80-88%. She then ambulated to BR and attempt to have BM. At conclusion of session, pt is seated in recliner with BLEs elevated and call bell in reach + chair alarm set. Pt was able to return to 4 L o2 with sao2 90 % after 3 minute rest with focus on breathing techniques.  Pt continues to be limited by endurance deficits and requires increased time to rest between all OOB activity. PT will continue to follow per POC.    Follow Up Recommendations  Home health PT;Supervision for mobility/OOB     Equipment Recommendations  Rolling walker with 5" wheels;3in1 (PT);Wheelchair (measurements PT)    Recommendations for Other Services       Precautions / Restrictions Precautions Precautions: Fall Restrictions Weight Bearing Restrictions: No     Mobility  Bed Mobility Overal bed mobility: Modified Independent             General bed mobility comments: Pt was on 4 L o2 upon arriving. sao2 92%. with sitting up EOB desats to 82 percent. Therapist increased O2 to 6 L throughout remainder of session.  Transfers Overall transfer level: Needs assistance Equipment used: Rolling walker (2 wheeled) Transfers: Sit to/from Stand Sit to Stand: Modified independent (Device/Increase time)         General transfer comment: Pt was able to stand EOB/from recliner/ and from toilet with supervision for safety. vcs for breathing techniques throughout.  Ambulation/Gait Ambulation/Gait assistance: Supervision Gait Distance (Feet): 20 Feet Assistive device: Rolling walker (2 wheeled) Gait Pattern/deviations: WFL(Within Functional Limits) Gait velocity: decreased   General Gait Details: pt demonstrated safe gait pattern but is limited by cardiopulmonary restrictions   Stairs             Wheelchair Mobility    Modified Rankin (Stroke Patients Only)       Balance                                            Cognition Arousal/Alertness: Awake/alert Behavior During Therapy: WFL for tasks assessed/performed Overall Cognitive Status: Within Functional Limits for tasks assessed  General Comments: Pt A and O x 4      Exercises      General Comments        Pertinent Vitals/Pain Pain Assessment: No/denies pain    Home Living                      Prior Function            PT Goals (current goals can now be found in the care plan section) Acute Rehab PT Goals Patient Stated Goal: " I just want to go home" Progress towards PT goals: Progressing toward goals    Frequency    Min 2X/week      PT Plan Current plan remains appropriate    Co-evaluation     PT goals addressed during session: Mobility/safety with mobility         AM-PAC PT "6 Clicks" Mobility   Outcome Measure  Help needed turning from your back to your side while in a flat bed without using bedrails?: None Help needed moving from lying on your back to sitting on the side of a flat bed without using bedrails?: None Help needed moving to and from a bed to a chair (including a wheelchair)?: None Help needed standing up from a chair using your arms (e.g., wheelchair or bedside chair)?: None Help needed to walk in hospital room?: None Help needed climbing 3-5 steps with a railing? : A Little 6 Click Score: 23    End of Session Equipment Utilized During Treatment: Oxygen Activity Tolerance: Patient limited by fatigue;Treatment limited secondary to medical complications (Comment)(limited by desats on 6 L o2) Patient left: in chair;with call bell/phone within reach;with chair alarm set Nurse Communication: Mobility status PT Visit Diagnosis: Difficulty in walking, not elsewhere classified (R26.2);Muscle weakness (generalized) (M62.81)     Time: [id number] PT Time Calculation (min) (ACUTE ONLY): 35 min  Charges:  $Gait Training: 23-37 mins                     Julaine Fusi PTA 04/07/19, 10:27 AM

## 2019-04-07 NOTE — Discharge Instructions (Signed)

## 2019-04-07 NOTE — TOC Transition Note (Signed)
Transition of Care [facility]) - CM/SW Discharge Note   Patient Details  Name: Rebecca Kirby MRN: [id number] Date of Birth: [birth year]  Transition of Care [facility]) CM/SW Contact:  Candie Chroman, LCSW Phone Number: 04/07/2019, 1:01 PM   Clinical Narrative: Patient has orders to discharge home today. [facility] representative is aware. CSW met with patient. She confirmed she is still going home with her daughter today. Will have oxygen delivered to room. Patient said she has a transfer chair at home and does not need a wheelchair. [employer] is aware. No further concerns. CSW signing off.    Final next level of care: [place] Barriers to Discharge: Barriers Resolved   Patient Goals and CMS Choice Patient states their goals for this hospitalization and ongoing recovery are:: Hope to get out of here and not have to come back   Choice offered to / list presented to : Patient  Discharge Placement                Patient to be transferred to facility by: Family will transport.   Patient and family notified of of transfer: 04/07/19  Discharge Plan and Services In-house Referral: Clinical Social Work              DME Arranged: Oxygen DME Agency: [employer] Date DME Agency Contacted: 04/07/19   Representative spoke with at DME Agency: [facility] [place] HH Arranged: RN, PT, OT [facility] Agency: [facility] [place] Date [place]: 04/07/19 Time [place]: 1321 Representative spoke with at [place]: [place]) Interventions     Readmission Risk Interventions No flowsheet data found.

## 2019-04-09 ENCOUNTER — Other Ambulatory Visit: Payer: Self-pay | Admitting: Pulmonary Disease

## 2019-04-09 MED ORDER — HUMIDIFIER MISC: 1.0000 "application " | Freq: Two times a day (BID) | 11 refills | Status: AC

## 2019-04-10 LAB — URINE CULTURE

## 2019-05-25 ENCOUNTER — Ambulatory Visit
Admission: RE | Admit: 2019-05-25 | Discharge: 2019-05-25 | Disposition: A | Discharge status: Home / Self Care | Payer: Medicare Other | Source: Ambulatory Visit | Attending: Internal Medicine | Admitting: Internal Medicine

## 2019-05-25 DIAGNOSIS — R928 Other abnormal and inconclusive findings on diagnostic imaging of breast: Secondary | ICD-10-CM

## 2019-05-25 DIAGNOSIS — R921 Mammographic calcification found on diagnostic imaging of breast: Secondary | ICD-10-CM

## 2019-05-27 ENCOUNTER — Other Ambulatory Visit: Payer: Self-pay | Admitting: Internal Medicine

## 2019-05-27 DIAGNOSIS — R928 Other abnormal and inconclusive findings on diagnostic imaging of breast: Secondary | ICD-10-CM

## 2019-05-27 DIAGNOSIS — R921 Mammographic calcification found on diagnostic imaging of breast: Secondary | ICD-10-CM

## 2019-09-04 ENCOUNTER — Other Ambulatory Visit: Payer: Self-pay

## 2019-09-04 ENCOUNTER — Other Ambulatory Visit: Payer: Self-pay | Admitting: Internal Medicine

## 2019-09-04 ENCOUNTER — Ambulatory Visit
Admission: RE | Admit: 2019-09-04 | Discharge: 2019-09-04 | Disposition: A | Discharge status: Home / Self Care | Payer: Medicare Other | Source: Ambulatory Visit | Attending: Internal Medicine | Admitting: Internal Medicine

## 2019-09-04 ENCOUNTER — Encounter
Admission: RE | Admit: 2019-09-04 | Discharge: 2019-09-04 | Disposition: A | Discharge status: Home / Self Care | Payer: Medicare Other | Source: Ambulatory Visit | Attending: Internal Medicine | Admitting: Internal Medicine

## 2019-09-04 DIAGNOSIS — R7989 Other specified abnormal findings of blood chemistry: Secondary | ICD-10-CM

## 2019-09-04 DIAGNOSIS — R0789 Other chest pain: Secondary | ICD-10-CM | POA: Insufficient documentation

## 2019-09-04 MED ORDER — TECHNETIUM TO 99M ALBUMIN AGGREGATED
4.0000 | Freq: Once | INTRAVENOUS | Status: AC | PRN
  Administered 2019-09-04: 4.19 via INTRAVENOUS

## 2022-07-16 ENCOUNTER — Other Ambulatory Visit: Payer: Self-pay | Admitting: Internal Medicine

## 2022-07-16 ENCOUNTER — Encounter: Payer: Self-pay | Admitting: Internal Medicine

## 2022-07-16 DIAGNOSIS — R0602 Shortness of breath: Secondary | ICD-10-CM

## 2022-07-16 DIAGNOSIS — R7989 Other specified abnormal findings of blood chemistry: Secondary | ICD-10-CM

## 2022-10-01 ENCOUNTER — Other Ambulatory Visit: Payer: Self-pay | Admitting: Pulmonary Disease

## 2022-10-01 ENCOUNTER — Ambulatory Visit
Admission: RE | Admit: 2022-10-01 | Discharge: 2022-10-01 | Disposition: A | Discharge status: Home / Self Care | Payer: 59 | Source: Ambulatory Visit | Attending: Interventional Radiology | Admitting: Interventional Radiology

## 2022-10-01 ENCOUNTER — Other Ambulatory Visit: Payer: Self-pay | Admitting: Interventional Radiology

## 2022-10-01 ENCOUNTER — Ambulatory Visit
Admission: RE | Admit: 2022-10-01 | Discharge: 2022-10-01 | Disposition: A | Discharge status: Home / Self Care | Payer: 59 | Source: Ambulatory Visit | Attending: Pulmonary Disease | Admitting: Pulmonary Disease

## 2022-10-01 DIAGNOSIS — J90 Pleural effusion, not elsewhere classified: Secondary | ICD-10-CM

## 2022-10-01 DIAGNOSIS — Z9889 Other specified postprocedural states: Secondary | ICD-10-CM

## 2022-10-01 DIAGNOSIS — I700 Atherosclerosis of aorta: Secondary | ICD-10-CM | POA: Diagnosis not present

## 2022-10-01 LAB — PROTEIN, PLEURAL OR PERITONEAL FLUID: Total protein, fluid: <3 g/dL

## 2022-10-01 LAB — BODY FLUID CULTURE W GRAM STAIN: Gram Stain: NONE SEEN

## 2022-10-01 LAB — BODY FLUID CELL COUNT WITH DIFFERENTIAL
Eos, Fluid: 0 %
Lymphs, Fluid: 23 %
Monocyte-Macrophage-Serous Fluid: 13 %
Neutrophil Count, Fluid: 64 %
Total Nucleated Cell Count, Fluid: 186 cu mm

## 2022-10-01 LAB — GLUCOSE, PLEURAL OR PERITONEAL FLUID: Glucose, Fluid: 157 mg/dL

## 2022-10-01 LAB — AMYLASE, PLEURAL OR PERITONEAL FLUID: Amylase, Fluid: 21 U/L

## 2022-10-01 LAB — LACTATE DEHYDROGENASE, PLEURAL OR PERITONEAL FLUID: LD, Fluid: 71 U/L — ABNORMAL HIGH (ref 3–23)

## 2022-10-01 MED ORDER — LIDOCAINE HCL (PF) 1 % IJ SOLN
10.0000 mL | Freq: Once | INTRAMUSCULAR | Status: AC
  Administered 2022-10-01: 10 mL via INTRADERMAL
  Filled 2022-10-01: qty 10

## 2022-11-15 LAB — ACID FAST CULTURE WITH REFLEXED SENSITIVITIES (MYCOBACTERIA): Acid Fast Culture: NEGATIVE

## 2023-01-10 ENCOUNTER — Encounter: Payer: 59 | Attending: Pulmonary Disease

## 2023-01-10 ENCOUNTER — Other Ambulatory Visit: Payer: Self-pay

## 2023-01-10 DIAGNOSIS — Z8616 Personal history of COVID-19: Secondary | ICD-10-CM | POA: Insufficient documentation

## 2023-01-10 DIAGNOSIS — R0609 Other forms of dyspnea: Secondary | ICD-10-CM | POA: Insufficient documentation

## 2023-01-10 NOTE — Progress Notes (Signed)
Virtual Visit completed. Patient informed on EP and RD appointment and 6 Minute walk test. Patient also informed of patient health questionnaires on My Chart. Patient Verbalizes understanding. [facility].

## 2023-01-14 VITALS — Ht 65.5 in | Wt 211.0 lb

## 2023-01-14 DIAGNOSIS — R0609 Other forms of dyspnea: Secondary | ICD-10-CM | POA: Diagnosis present

## 2023-01-14 DIAGNOSIS — Z8616 Personal history of COVID-19: Secondary | ICD-10-CM | POA: Diagnosis not present

## 2023-01-14 NOTE — Progress Notes (Signed)
Pulmonary Individual Treatment Plan  Patient Details  Name: Rebecca Kirby MRN: [id number] Date of Birth: 01-02-1946 Referring Provider:   Flowsheet Row Pulmonary Rehab from 01/14/2023 in [facility] Cardiac and Pulmonary Rehab  Referring Provider Dr. Vida Rigger, MD       Initial Encounter Date:  Flowsheet Row Pulmonary Rehab from 01/14/2023 in [facility] Cardiac and Pulmonary Rehab  Date 01/14/23       Visit Diagnosis: Post-COVID chronic dyspnea  Patient's Home Medications on Admission:  Current Outpatient Medications:    Accu-Chek FastClix Lancets MISC, USE 1 EACH FOUR TIMES DAILY. USE AS INSTRUCTED, Disp: , Rfl:    acetaminophen (TYLENOL) 500 MG tablet, Take 500-1,000 mg by mouth 2 (two) times daily., Disp: , Rfl:    acetaminophen (TYLENOL) 500 MG tablet, Take by mouth., Disp: , Rfl:    albuterol (VENTOLIN HFA) 108 (90 Base) MCG/ACT inhaler, Inhale 1 puff into the lungs every 6 (six) hours as needed for wheezing or shortness of breath., Disp: 18 g, Rfl: 0   ALPRAZolam (XANAX) 0.25 MG tablet, Take by mouth., Disp: , Rfl:    B-D UF III MINI PEN NEEDLES 31G X 5 MM MISC, 4 (four) times daily. as directed, Disp: , Rfl:    benzonatate (TESSALON) 200 MG capsule, , Disp: , Rfl:    calcium acetate (PHOSLO) 667 MG capsule, Take 1 capsule by mouth 2 (two) times daily., Disp: , Rfl:    cefUROXime (CEFTIN) 250 MG tablet, Take by mouth. (Patient not taking: Reported on 01/10/2023), Disp: , Rfl:    cephALEXin (KEFLEX) 250 MG capsule, , Disp: , Rfl:    chlorpheniramine-HYDROcodone (TUSSIONEX) 10-8 MG/5ML, SHAKE LIQUID AND TAKE 5 ML BY MOUTH EVERY 12 HOURS AS NEEDED FOR COUGH, Disp: , Rfl:    citalopram (CELEXA) 40 MG tablet, Take 40 mg by mouth daily., Disp: , Rfl:    citalopram (CELEXA) 40 MG tablet, Take 1 tablet by mouth daily., Disp: , Rfl:    dexamethasone (DECADRON) 2 MG tablet, TAKE 1 TABLET DAILY WITH BREAKFAST FOR 20 DAYS THEN TAKE 1/2 TABLET DAILY FOR 10 DAYS, Disp: , Rfl:    empagliflozin  (JARDIANCE) 10 MG TABS tablet, Take 1 tablet by mouth daily., Disp: , Rfl:    Ensure Max Protein (ENSURE MAX PROTEIN) LIQD, Take 330 mLs (11 oz total) by mouth 2 (two) times daily between meals. (Patient not taking: Reported on 01/10/2023), Disp: 660 mL, Rfl: 0   Ferrous Fum-Iron Polysacch 53-53 MG CAPS, Take by mouth., Disp: , Rfl:    ferrous sulfate 325 (65 FE) MG tablet, Take 1 tablet (325 mg total) by mouth daily., Disp: 30 tablet, Rfl: 0   fexofenadine (ALLEGRA) 180 MG tablet, Take by mouth., Disp: , Rfl:    fluconazole (DIFLUCAN) 150 MG tablet, Take by mouth., Disp: , Rfl:    fluticasone (FLONASE) 50 MCG/ACT nasal spray, Place 2 sprays into both nostrils daily. (Patient not taking: Reported on 01/10/2023), Disp: , Rfl:    fluticasone (FLONASE) 50 MCG/ACT nasal spray, Place into the nose., Disp: , Rfl:    furosemide (LASIX) 40 MG tablet, TAKE 1 TABLET BY MOUTH ONCE DAILY AS NEEDED FOR EDEMA HOLD IF SYSTOLIC BLOOD PRESSURE<100, Disp: , Rfl:    HUMALOG KWIKPEN 100 UNIT/ML KwikPen, Inject 0.08 mLs (8 Units total) into the skin 3 (three) times daily. Per sliding scale, Disp: 15 mL, Rfl: 11   HYDROcodone-acetaminophen (NORCO) 10-325 MG tablet, Take by mouth., Disp: , Rfl:    ipratropium-albuterol (DUONEB) 0.5-2.5 (3) MG/3ML SOLN,  Inhale into the lungs., Disp: , Rfl:    LAGEVRIO 200 MG CAPS capsule, TAKE FOUR CAPSULES BY MOUTH TWO TIMES A DAY FOR FIVE DAYS (Patient not taking: Reported on 01/10/2023), Disp: , Rfl:    levothyroxine (SYNTHROID) 100 MCG tablet, Take by mouth., Disp: , Rfl:    levothyroxine (SYNTHROID) 125 MCG tablet, Take by mouth., Disp: , Rfl:    levothyroxine (SYNTHROID, LEVOTHROID) 175 MCG tablet, Take 175 mcg by mouth daily before breakfast., Disp: , Rfl:    losartan (COZAAR) 25 MG tablet, Take 1 tablet by mouth at bedtime., Disp: , Rfl:    magnesium oxide (MAG-OX) 400 (240 Mg) MG tablet, , Disp: , Rfl:    magnesium oxide (MAG-OX) 400 MG tablet, Take by mouth., Disp: , Rfl:     metoprolol tartrate (LOPRESSOR) 25 MG tablet, , Disp: , Rfl:    pantoprazole (PROTONIX) 40 MG tablet, Take 40 mg by mouth daily., Disp: , Rfl:    predniSONE (DELTASONE) 10 MG tablet, , Disp: , Rfl:    propranolol (INDERAL) 40 MG tablet, , Disp: , Rfl:    sulfamethoxazole-trimethoprim (BACTRIM) 400-80 MG tablet, TAKE 1 TABLET BY MOUTH DAILY FOR 21 DAYS (Patient not taking: Reported on 01/10/2023), Disp: , Rfl:    TRESIBA FLEXTOUCH 200 UNIT/ML SOPN, Inject 26 Units into the skin every evening., Disp: , Rfl:    verapamil (CALAN-SR) 120 MG CR tablet, Take by mouth., Disp: , Rfl:    vitamin B-12 (CYANOCOBALAMIN) 1000 MCG tablet, Take 1,000 mcg by mouth daily. Reported on 07/07/2015, Disp: , Rfl:   Past Medical History: Past Medical History:  Diagnosis Date   Anemia    Anxiety    Colon cancer (HCC)    Diabetes mellitus without complication (HCC)    Family history of adverse reaction to anesthesia    daughter had reaction to anesthesia and was in icu   GERD (gastroesophageal reflux disease)    Hypertension    Hypothyroidism    Kidney cancer, primary, with metastasis from kidney to other site [facility]) 08/26/2014   right   Pancreas cancer (HCC) 2018   Thyroid cancer (HCC)     Tobacco Use: Social History   Tobacco Use  Smoking Status Former   Current packs/day: 0.00   Types: Cigarettes   Quit date: 07/29/1994   Years since quitting: 28.4  Smokeless Tobacco Never    Labs: Review Flowsheet       Latest Ref Rng & Units 07/29/2014 03/19/2019  Labs for ITP Cardiac and Pulmonary Rehab  Trlycerides <150 mg/dL - 884   Hemoglobin Z6S 4.8 - 5.6 % 7.3  7.4     Details             Pulmonary Assessment Scores:  Pulmonary Assessment Scores     Row Name 01/14/23 1516         ADL UCSD   ADL Phase Entry     SOB Score total 81     Rest 1     Walk 3     Stairs 5     Bath 2     Dress 3     Shop 3       CAT Score   CAT Score 23       mMRC Score   mMRC Score 2               UCSD: Self-administered rating of dyspnea associated with activities of daily living (ADLs) 6-point scale (0 = "not at all" to  5 = "maximal or unable to do because of breathlessness")  Scoring Scores range from 0 to 120.  Minimally important difference is 5 units  CAT: CAT can identify the health impairment of COPD patients and is better correlated with disease progression.  CAT has a scoring range of zero to 40. The CAT score is classified into four groups of low (less than 10), medium (10 - 20), high (21-30) and very high (31-40) based on the impact level of disease on health status. A CAT score over 10 suggests significant symptoms.  A worsening CAT score could be explained by an exacerbation, poor medication adherence, poor inhaler technique, or progression of COPD or comorbid conditions.  CAT MCID is 2 points  mMRC: mMRC (Modified Medical Research Council) Dyspnea Scale is used to assess the degree of baseline functional disability in patients of respiratory disease due to dyspnea. No minimal important difference is established. A decrease in score of 1 point or greater is considered a positive change.   Pulmonary Function Assessment:  Pulmonary Function Assessment - 01/10/23 0929       Breath   Shortness of Breath Yes;Limiting activity             Exercise Target Goals: Exercise Program Goal: Individual exercise prescription set using results from initial 6 min walk test and THRR while considering  patient's activity barriers and safety.   Exercise Prescription Goal: Initial exercise prescription builds to 30-45 minutes a day of aerobic activity, 2-3 days per week.  Home exercise guidelines will be given to patient during program as part of exercise prescription that the participant will acknowledge.  Education: Aerobic Exercise: - Group verbal and visual presentation on the components of exercise prescription. Introduces F.I.T.T principle from ACSM for exercise  prescriptions.  Reviews F.I.T.T. principles of aerobic exercise including progression. Written material given at graduation.   Education: Resistance Exercise: - Group verbal and visual presentation on the components of exercise prescription. Introduces F.I.T.T principle from ACSM for exercise prescriptions  Reviews F.I.T.T. principles of resistance exercise including progression. Written material given at graduation.    Education: Exercise & Equipment Safety: - Individual verbal instruction and demonstration of equipment use and safety with use of the equipment. Flowsheet Row Pulmonary Rehab from 01/14/2023 in [facility] Cardiac and Pulmonary Rehab  Date 01/10/23  Educator [facility]  Instruction Review Code 1- Verbalizes Understanding       Education: Exercise Physiology & General Exercise Guidelines: - Group verbal and written instruction with models to review the exercise physiology of the cardiovascular system and associated critical values. Provides general exercise guidelines with specific guidelines to those with heart or lung disease.    Education: Flexibility, Balance, Mind/Body Relaxation: - Group verbal and visual presentation with interactive activity on the components of exercise prescription. Introduces F.I.T.T principle from ACSM for exercise prescriptions. Reviews F.I.T.T. principles of flexibility and balance exercise training including progression. Also discusses the mind body connection.  Reviews various relaxation techniques to help reduce and manage stress (i.e. Deep breathing, progressive muscle relaxation, and visualization). Balance handout provided to take home. Written material given at graduation.   Activity Barriers & Risk Stratification:  Activity Barriers & Cardiac Risk Stratification - 01/14/23 1526       Activity Barriers & Cardiac Risk Stratification   Activity Barriers Left Knee Replacement;Right Knee Replacement;Balance Concerns;History of Falls;Shortness of Breath              6 Minute Walk:  6 Minute Walk     Row  Name 01/14/23 1520         6 Minute Walk   Phase Initial     Distance 560 feet     Walk Time 4.08 minutes     # of Rest Breaks 1     MPH 1.56     METS 1.23     RPE 13     Perceived Dyspnea  2     VO2 Peak 4.31     Symptoms Yes (comment)     Comments SOB     Resting HR 96 bpm     Resting BP 122/62     Resting Oxygen Saturation  98 %     Exercise Oxygen Saturation  during 6 min walk 91 %     Max Ex. HR 137 bpm     Max Ex. BP 148/66     2 Minute Post BP 136/62       Interval HR   1 Minute HR 114     2 Minute HR 127     3 Minute HR 137     4 Minute HR 117     5 Minute HR 109     6 Minute HR 113     2 Minute Post HR 102     Interval Heart Rate? Yes       Interval Oxygen   Interval Oxygen? Yes     Baseline Oxygen Saturation % 98 %     1 Minute Oxygen Saturation % 91 %     1 Minute Liters of Oxygen 2 L  Pulsed     2 Minute Oxygen Saturation % 91 %     2 Minute Liters of Oxygen 2 L     3 Minute Oxygen Saturation % 92 %     3 Minute Liters of Oxygen 2 L     4 Minute Oxygen Saturation % 92 %     4 Minute Liters of Oxygen 2 L     5 Minute Oxygen Saturation % 96 %     5 Minute Liters of Oxygen 2 L     6 Minute Oxygen Saturation % 93 %     6 Minute Liters of Oxygen 2 L     2 Minute Post Oxygen Saturation % 98 %     2 Minute Post Liters of Oxygen 2 L             Oxygen Initial Assessment:  Oxygen Initial Assessment - 01/10/23 0927       Home Oxygen   Home Oxygen Device Home Concentrator;Portable Concentrator    Sleep Oxygen Prescription Continuous    Liters per minute 2    Home Exercise Oxygen Prescription Continuous    Liters per minute 2    Home Resting Oxygen Prescription Continuous    Liters per minute 2    Compliance with Home Oxygen Use Yes      Initial 6 min Walk   Oxygen Used Continuous;Portable Concentrator    Liters per minute 2      Program Oxygen Prescription   Program Oxygen  Prescription Continuous    Liters per minute 2      Intervention   Short Term Goals To learn and exhibit compliance with exercise, home and travel O2 prescription;To learn and understand importance of monitoring SPO2 with pulse oximeter and demonstrate accurate use of the pulse oximeter.;To learn and understand importance of maintaining oxygen saturations>88%;To learn and demonstrate proper pursed lip breathing techniques or other breathing  techniques. ;To learn and demonstrate proper use of respiratory medications    Long  Term Goals Exhibits compliance with exercise, home  and travel O2 prescription;Verbalizes importance of monitoring SPO2 with pulse oximeter and return demonstration;Maintenance of O2 saturations>88%;Exhibits proper breathing techniques, such as pursed lip breathing or other method taught during program session;Compliance with respiratory medication;Demonstrates proper use of MDI's             Oxygen Re-Evaluation:   Oxygen Discharge (Final Oxygen Re-Evaluation):   Initial Exercise Prescription:  Initial Exercise Prescription - 01/14/23 1500       Date of Initial Exercise RX and Referring Provider   Date 01/14/23    Referring Provider Dr. Vida Rigger, MD      Oxygen   Oxygen Continuous    Liters 2    Maintain Oxygen Saturation 88% or higher      Recumbant Bike   Level 1    RPM 50    Watts 15    Minutes 15    METs 1.23      NuStep   Level 1    SPM 80    Minutes 15    METs 1.23      Track   Laps 10    Minutes 15    METs 1.54      Prescription Details   Frequency (times per week) 2    Duration Progress to 30 minutes of continuous aerobic without signs/symptoms of physical distress      Intensity   THRR 40-80% of Max Heartrate 114-132    Ratings of Perceived Exertion 11-13    Perceived Dyspnea 0-4      Progression   Progression Continue to progress workloads to maintain intensity without signs/symptoms of physical distress.       Resistance Training   Training Prescription Yes    Weight 4 lb    Reps 10-15             Perform Capillary Blood Glucose checks as needed.  Exercise Prescription Changes:   Exercise Prescription Changes     Row Name 01/14/23 1500             Response to Exercise   Blood Pressure (Admit) 122/62       Blood Pressure (Exercise) 148/66       Blood Pressure (Exit) 136/62       Heart Rate (Admit) 96 bpm       Heart Rate (Exercise) 137 bpm       Heart Rate (Exit) 102 bpm       Oxygen Saturation (Admit) 98 %       Oxygen Saturation (Exercise) 91 %       Oxygen Saturation (Exit) 98 %       Rating of Perceived Exertion (Exercise) 13       Perceived Dyspnea (Exercise) 2       Symptoms SOB       Comments Results                Exercise Comments:   Exercise Goals and Review:   Exercise Goals     Row Name 01/14/23 1526             Exercise Goals   Increase Physical Activity Yes       Intervention Provide advice, education, support and counseling about physical activity/exercise needs.;Develop an individualized exercise prescription for aerobic and resistive training based on initial evaluation findings, risk stratification, comorbidities and participant's personal goals.  Expected Outcomes Short Term: Attend rehab on a regular basis to increase amount of physical activity.;Long Term: Exercising regularly at least 3-5 days a week.;Long Term: Add in home exercise to make exercise part of routine and to increase amount of physical activity.       Increase Strength and Stamina Yes       Intervention Develop an individualized exercise prescription for aerobic and resistive training based on initial evaluation findings, risk stratification, comorbidities and participant's personal goals.;Provide advice, education, support and counseling about physical activity/exercise needs.       Expected Outcomes Short Term: Increase workloads from initial exercise prescription  for resistance, speed, and METs.;Short Term: Perform resistance training exercises routinely during rehab and add in resistance training at home;Long Term: Improve cardiorespiratory fitness, muscular endurance and strength as measured by increased METs and functional capacity ( )       Able to understand and use rate of perceived exertion (RPE) scale Yes       Intervention Provide education and explanation on how to use RPE scale       Expected Outcomes Short Term: Able to use RPE daily in rehab to express subjective intensity level;Long Term:  Able to use RPE to guide intensity level when exercising independently       Able to understand and use Dyspnea scale Yes       Intervention Provide education and explanation on how to use Dyspnea scale       Expected Outcomes Short Term: Able to use Dyspnea scale daily in rehab to express subjective sense of shortness of breath during exertion;Long Term: Able to use Dyspnea scale to guide intensity level when exercising independently       Knowledge and understanding of Target Heart Rate Range (THRR) Yes       Intervention Provide education and explanation of THRR including how the numbers were predicted and where they are located for reference       Expected Outcomes Short Term: Able to state/look up THRR;Long Term: Able to use THRR to govern intensity when exercising independently;Short Term: Able to use daily as guideline for intensity in rehab       Able to check pulse independently Yes       Intervention Provide education and demonstration on how to check pulse in carotid and radial arteries.;Review the importance of being able to check your own pulse for safety during independent exercise       Expected Outcomes Short Term: Able to explain why pulse checking is important during independent exercise;Long Term: Able to check pulse independently and accurately       Understanding of Exercise Prescription Yes       Intervention Provide education,  explanation, and written materials on patient's individual exercise prescription       Expected Outcomes Short Term: Able to explain program exercise prescription;Long Term: Able to explain home exercise prescription to exercise independently                Exercise Goals Re-Evaluation :   Discharge Exercise Prescription (Final Exercise Prescription Changes):  Exercise Prescription Changes - 01/14/23 1500       Response to Exercise   Blood Pressure (Admit) 122/62    Blood Pressure (Exercise) 148/66    Blood Pressure (Exit) 136/62    Heart Rate (Admit) 96 bpm    Heart Rate (Exercise) 137 bpm    Heart Rate (Exit) 102 bpm    Oxygen Saturation (Admit) 98 %    Oxygen  Saturation (Exercise) 91 %    Oxygen Saturation (Exit) 98 %    Rating of Perceived Exertion (Exercise) 13    Perceived Dyspnea (Exercise) 2    Symptoms SOB    Comments Results             Nutrition:  Target Goals: Understanding of nutrition guidelines, daily intake of sodium 1500mg , cholesterol 200mg , calories 30% from fat and 7% or less from saturated fats, daily to have 5 or more servings of fruits and vegetables.  Education: All About Nutrition: -Group instruction provided by verbal, written material, interactive activities, discussions, models, and posters to present general guidelines for heart healthy nutrition including fat, fiber, MyPlate, the role of sodium in heart healthy nutrition, utilization of the nutrition label, and utilization of this knowledge for meal planning. Follow up email sent as well. Written material given at graduation.   Biometrics:  Pre Biometrics - 01/14/23 1527       Pre Biometrics   Height 5' 5.5" (1.664 m)    Weight 211 lb (95.7 kg)    Waist Circumference 43.5 inches    Hip Circumference 50 inches    Waist to Hip Ratio 0.87 %    BMI (Calculated) 34.57    Single Leg Stand 2.85 seconds              Nutrition Therapy Plan and Nutrition Goals:  Nutrition  Therapy & Goals - 01/14/23 1514       Nutrition Therapy   RD appointment deferred Yes      Intervention Plan   Intervention Prescribe, educate and counsel regarding individualized specific dietary modifications aiming towards targeted core components such as weight, hypertension, lipid management, diabetes, heart failure and other comorbidities.    Expected Outcomes Short Term Goal: Understand basic principles of dietary content, such as calories, fat, sodium, cholesterol and nutrients.;Short Term Goal: A plan has been developed with personal nutrition goals set during dietitian appointment.;Long Term Goal: Adherence to prescribed nutrition plan.             Nutrition Assessments:  MEDIFICTS Score Key: >=70 Need to make dietary changes  40-70 Heart Healthy Diet <= 40 Therapeutic Level Cholesterol Diet  Flowsheet Row Pulmonary Rehab from 01/14/2023 in [facility] Cardiac and Pulmonary Rehab  Picture Your Plate Total Score on Admission 46      Picture Your Plate Scores: <78 Unhealthy dietary pattern with much room for improvement. 41-50 Dietary pattern unlikely to meet recommendations for good health and room for improvement. 51-60 More healthful dietary pattern, with some room for improvement.  >60 Healthy dietary pattern, although there may be some specific behaviors that could be improved.   Nutrition Goals Re-Evaluation:   Nutrition Goals Discharge (Final Nutrition Goals Re-Evaluation):   Psychosocial: Target Goals: Acknowledge presence or absence of significant depression and/or stress, maximize coping skills, provide positive support system. Participant is able to verbalize types and ability to use techniques and skills needed for reducing stress and depression.   Education: Stress, Anxiety, and Depression - Group verbal and visual presentation to define topics covered.  Reviews how body is impacted by stress, anxiety, and depression.  Also discusses healthy ways to reduce  stress and to treat/manage anxiety and depression.  Written material given at graduation.   Education: Sleep Hygiene -Provides group verbal and written instruction about how sleep can affect your health.  Define sleep hygiene, discuss sleep cycles and impact of sleep habits. Review good sleep hygiene tips.    Initial Review &  Psychosocial Screening:  Initial Psych Review & Screening - 01/10/23 0931       Initial Review   Current issues with Current Psychotropic Meds;Current Depression;History of Depression      Family Dynamics   Good Support System? Yes    Comments Dondra Spry has a lot of friends, chruch and family that support her. She does take medication for depression. She is ready to get started in the program since her last bout with Covid she has been more short of breath.      Barriers   Psychosocial barriers to participate in program The patient should benefit from training in stress management and relaxation.      Screening Interventions   Interventions Encouraged to exercise;To provide support and resources with identified psychosocial needs;Provide feedback about the scores to participant    Expected Outcomes Short Term goal: Utilizing psychosocial counselor, staff and physician to assist with identification of specific Stressors or current issues interfering with healing process. Setting desired goal for each stressor or current issue identified.;Long Term Goal: Stressors or current issues are controlled or eliminated.;Short Term goal: Identification and review with participant of any Quality of Life or Depression concerns found by scoring the questionnaire.;Long Term goal: The participant improves quality of Life and PHQ9 Scores as seen by post scores and/or verbalization of changes             Quality of Life Scores:  Scores of 19 and below usually indicate a poorer quality of life in these areas.  A difference of  2-3 points is a clinically meaningful difference.  A  difference of 2-3 points in the total score of the Quality of Life Index has been associated with significant improvement in overall quality of life, self-image, physical symptoms, and general health in studies assessing change in quality of life.  PHQ-9: Review Flowsheet       01/14/2023  Depression screen PHQ 2/9  Decreased Interest 0  Down, Depressed, Hopeless 0  PHQ - 2 Score 0  Altered sleeping 2  Tired, decreased energy 0  Change in appetite 0  Feeling bad or failure about yourself  0  Trouble concentrating 1  Moving slowly or fidgety/restless 0  Suicidal thoughts 1  PHQ-9 Score 4  Difficult doing work/chores Somewhat difficult    Details           Interpretation of Total Score  Total Score Depression Severity:  1-4 = Minimal depression, 5-9 = Mild depression, 10-14 = Moderate depression, 15-19 = Moderately severe depression, 20-27 = Severe depression   Psychosocial Evaluation and Intervention:  Psychosocial Evaluation - 01/10/23 0933       Psychosocial Evaluation & Interventions   Interventions Encouraged to exercise with the program and follow exercise prescription;Stress management education;Relaxation education    Comments Dondra Spry has a lot of friends, chruch and family that support her. She does take medication for depression. She is ready to get started in the program since her last bout with Covid she has been more short of breath.    Expected Outcomes Short: Start [facility] to help with mood. Long: Maintain a healthy mental state.    Continue Psychosocial Services  Follow up required by staff             Psychosocial Re-Evaluation:   Psychosocial Discharge (Final Psychosocial Re-Evaluation):   Education: Education Goals: Education classes will be provided on a weekly basis, covering required topics. Participant will state understanding/return demonstration of topics presented.  Learning Barriers/Preferences:  Learning Barriers/Preferences -  01/10/23  0930       Learning Barriers/Preferences   Learning Barriers Sight    Learning Preferences None             General Pulmonary Education Topics:  Infection Prevention: - Provides verbal and written material to individual with discussion of infection control including proper hand washing and proper equipment cleaning during exercise session. Flowsheet Row Pulmonary Rehab from 01/14/2023 in [facility] Cardiac and Pulmonary Rehab  Date 01/10/23  Educator [facility]  Instruction Review Code 1- Verbalizes Understanding       Falls Prevention: - Provides verbal and written material to individual with discussion of falls prevention and safety. Flowsheet Row Pulmonary Rehab from 01/14/2023 in [facility] Cardiac and Pulmonary Rehab  Date 01/10/23  Educator [facility]  Instruction Review Code 1- Verbalizes Understanding       Chronic Lung Disease Review: - Group verbal instruction with posters, models, PowerPoint presentations and videos,  to review new updates, new respiratory medications, new advancements in procedures and treatments. Providing information on websites and "800" numbers for continued self-education. Includes information about supplement oxygen, available portable oxygen systems, continuous and intermittent flow rates, oxygen safety, concentrators, and Medicare reimbursement for oxygen. Explanation of Pulmonary Drugs, including class, frequency, complications, importance of spacers, rinsing mouth after steroid MDI's, and proper cleaning methods for nebulizers. Review of basic lung anatomy and physiology related to function, structure, and complications of lung disease. Review of risk factors. Discussion about methods for diagnosing sleep apnea and types of masks and machines for OSA. Includes a review of the use of types of environmental controls: home humidity, furnaces, filters, dust mite/pet prevention, HEPA vacuums. Discussion about weather changes, air quality and the benefits of nasal washing.  Instruction on Warning signs, infection symptoms, calling MD promptly, preventive modes, and value of vaccinations. Review of effective airway clearance, coughing and/or vibration techniques. Emphasizing that all should Create an Action Plan. Written material given at graduation. Flowsheet Row Pulmonary Rehab from 01/14/2023 in [facility] Cardiac and Pulmonary Rehab  Education need identified 01/14/23       AED/CPR: - Group verbal and written instruction with the use of models to demonstrate the basic use of the AED with the basic ABC's of resuscitation.    Anatomy and Cardiac Procedures: - Group verbal and visual presentation and models provide information about basic cardiac anatomy and function. Reviews the testing methods done to diagnose heart disease and the outcomes of the test results. Describes the treatment choices: Medical Management, Angioplasty, or Coronary Bypass Surgery for treating various heart conditions including Myocardial Infarction, Angina, Valve Disease, and Cardiac Arrhythmias.  Written material given at graduation.   Medication Safety: - Group verbal and visual instruction to review commonly prescribed medications for heart and lung disease. Reviews the medication, class of the drug, and side effects. Includes the steps to properly store meds and maintain the prescription regimen.  Written material given at graduation.   Other: -Provides group and verbal instruction on various topics (see comments)   Knowledge Questionnaire Score:  Knowledge Questionnaire Score - 01/14/23 1516       Knowledge Questionnaire Score   Pre Score 15/18              Core Components/Risk Factors/Patient Goals at Admission:  Personal Goals and Risk Factors at Admission - 01/10/23 0930       Core Components/Risk Factors/Patient Goals on Admission    Weight Management Yes;Weight Maintenance    Intervention Weight Management: Develop a combined nutrition and exercise program  designed  to reach desired caloric intake, while maintaining appropriate intake of nutrient and fiber, sodium and fats, and appropriate energy expenditure required for the weight goal.;Weight Management: Provide education and appropriate resources to help participant work on and attain dietary goals.;Weight Management/Obesity: Establish reasonable short term and long term weight goals.    Expected Outcomes Short Term: Continue to assess and modify interventions until short term weight is achieved;Long Term: Adherence to nutrition and physical activity/exercise program aimed toward attainment of established weight goal;Weight Maintenance: Understanding of the daily nutrition guidelines, which includes 25-35% calories from fat, 7% or less cal from saturated fats, less than 200mg  cholesterol, less than 1.5gm of sodium, & 5 or more servings of fruits and vegetables daily;Understanding recommendations for meals to include 15-35% energy as protein, 25-35% energy from fat, 35-60% energy from carbohydrates, less than 200mg  of dietary cholesterol, 20-35 gm of total fiber daily;Understanding of distribution of calorie intake throughout the day with the consumption of 4-5 meals/snacks    Improve shortness of breath with ADL's Yes    Intervention Provide education, individualized exercise plan and daily activity instruction to help decrease symptoms of SOB with activities of daily living.    Expected Outcomes Short Term: Improve cardiorespiratory fitness to achieve a reduction of symptoms when performing ADLs;Long Term: Be able to perform more ADLs without symptoms or delay the onset of symptoms    Diabetes Yes    Intervention Provide education about signs/symptoms and action to take for hypo/hyperglycemia.;Provide education about proper nutrition, including hydration, and aerobic/resistive exercise prescription along with prescribed medications to achieve blood glucose in normal ranges: Fasting glucose 65-99 mg/dL    Expected  Outcomes Short Term: Participant verbalizes understanding of the signs/symptoms and immediate care of hyper/hypoglycemia, proper foot care and importance of medication, aerobic/resistive exercise and nutrition plan for blood glucose control.;Long Term: Attainment of HbA1C < 7%.    Hypertension Yes    Intervention Provide education on lifestyle modifcations including regular physical activity/exercise, weight management, moderate sodium restriction and increased consumption of fresh fruit, vegetables, and low fat dairy, alcohol moderation, and smoking cessation.;Monitor prescription use compliance.    Expected Outcomes Short Term: Continued assessment and intervention until BP is < 140/64mm HG in hypertensive participants. < 130/50mm HG in hypertensive participants with diabetes, heart failure or chronic kidney disease.;Long Term: Maintenance of blood pressure at goal levels.             Education:Diabetes - Individual verbal and written instruction to review signs/symptoms of diabetes, desired ranges of glucose level fasting, after meals and with exercise. Acknowledge that pre and post exercise glucose checks will be done for 3 sessions at entry of program. Flowsheet Row Pulmonary Rehab from 01/14/2023 in [facility] Cardiac and Pulmonary Rehab  Date 01/10/23  Educator [facility]  Instruction Review Code 1- Verbalizes Understanding       Know Your Numbers and Heart Failure: - Group verbal and visual instruction to discuss disease risk factors for cardiac and pulmonary disease and treatment options.  Reviews associated critical values for Overweight/Obesity, Hypertension, Cholesterol, and Diabetes.  Discusses basics of heart failure: signs/symptoms and treatments.  Introduces Heart Failure Zone chart for action plan for heart failure.  Written material given at graduation. Flowsheet Row Pulmonary Rehab from 01/14/2023 in [facility] Cardiac and Pulmonary Rehab  Education need identified 01/14/23       Core  Components/Risk Factors/Patient Goals Review:    Core Components/Risk Factors/Patient Goals at Discharge (Final Review):    ITP Comments:  ITP Comments  Row Name 01/10/23 0926 01/14/23 1510         ITP Comments Virtual Visit completed. Patient informed on EP and RD appointment and 6 Minute walk test. Patient also informed of patient health questionnaires on My Chart. Patient Verbalizes understanding. Visit diagnosis can be found in [facility] 01/12/[facility]23. Completed and gym orientation. Initial ITP created and sent for review to Dr. Jinny Sanders, Medical Director.               Comments: Initial ITP

## 2023-01-14 NOTE — Patient Instructions (Signed)
Patient Instructions  Patient Details  Name: Rebecca Kirby MRN: [id number] Date of Birth: [birth year] Referring Provider:  Vida Rigger, MD  Below are your personal goals for exercise, nutrition, and risk factors. Our goal is to help you stay on track towards obtaining and maintaining these goals. We will be discussing your progress on these goals with you throughout the program.  Initial Exercise Prescription:  Initial Exercise Prescription - 01/14/23 1500       Date of Initial Exercise RX and Referring Provider   Date 01/14/23    Referring Provider Dr. Vida Rigger, MD      Oxygen   Oxygen Continuous    Liters 2    Maintain Oxygen Saturation 88% or higher      Recumbant Bike   Level 1    RPM 50    Watts 15    Minutes 15    METs 1.23      NuStep   Level 1    SPM 80    Minutes 15    METs 1.23      Track   Laps 10    Minutes 15    METs 1.54      Prescription Details   Frequency (times per week) 2    Duration Progress to 30 minutes of continuous aerobic without signs/symptoms of physical distress      Intensity   THRR 40-80% of Max Heartrate 114-132    Ratings of Perceived Exertion 11-13    Perceived Dyspnea 0-4      Progression   Progression Continue to progress workloads to maintain intensity without signs/symptoms of physical distress.      Resistance Training   Training Prescription Yes    Weight 4 lb    Reps 10-15             Exercise Goals: Frequency: Be able to perform aerobic exercise two to three times per week in program working toward 2-5 days per week of home exercise.  Intensity: Work with a perceived exertion of 11 (fairly light) - 15 (hard) while following your exercise prescription.  We will make changes to your prescription with you as you progress through the program.   Duration: Be able to do 30 to 45 minutes of continuous aerobic exercise in addition to a 5 minute warm-up and a 5 minute cool-down routine.   Nutrition  Goals: Your personal nutrition goals will be established when you do your nutrition analysis with the dietician.  The following are general nutrition guidelines to follow: Cholesterol < 200mg /day Sodium < 1500mg /day Fiber: Women over 50 yrs - 21 grams per day  Personal Goals:  Personal Goals and Risk Factors at Admission - 01/10/23 0930       Core Components/Risk Factors/Patient Goals on Admission    Weight Management Yes;Weight Maintenance    Intervention Weight Management: Develop a combined nutrition and exercise program designed to reach desired caloric intake, while maintaining appropriate intake of nutrient and fiber, sodium and fats, and appropriate energy expenditure required for the weight goal.;Weight Management: Provide education and appropriate resources to help participant work on and attain dietary goals.;Weight Management/Obesity: Establish reasonable short term and long term weight goals.    Expected Outcomes Short Term: Continue to assess and modify interventions until short term weight is achieved;Long Term: Adherence to nutrition and physical activity/exercise program aimed toward attainment of established weight goal;Weight Maintenance: Understanding of the daily nutrition guidelines, which includes 25-35% calories from fat, 7% or less cal from saturated  fats, less than 200mg  cholesterol, less than 1.5gm of sodium, & 5 or more servings of fruits and vegetables daily;Understanding recommendations for meals to include 15-35% energy as protein, 25-35% energy from fat, 35-60% energy from carbohydrates, less than 200mg  of dietary cholesterol, 20-35 gm of total fiber daily;Understanding of distribution of calorie intake throughout the day with the consumption of 4-5 meals/snacks    Improve shortness of breath with ADL's Yes    Intervention Provide education, individualized exercise plan and daily activity instruction to help decrease symptoms of SOB with activities of daily living.     Expected Outcomes Short Term: Improve cardiorespiratory fitness to achieve a reduction of symptoms when performing ADLs;Long Term: Be able to perform more ADLs without symptoms or delay the onset of symptoms    Diabetes Yes    Intervention Provide education about signs/symptoms and action to take for hypo/hyperglycemia.;Provide education about proper nutrition, including hydration, and aerobic/resistive exercise prescription along with prescribed medications to achieve blood glucose in normal ranges: Fasting glucose 65-99 mg/dL    Expected Outcomes Short Term: Participant verbalizes understanding of the signs/symptoms and immediate care of hyper/hypoglycemia, proper foot care and importance of medication, aerobic/resistive exercise and nutrition plan for blood glucose control.;Long Term: Attainment of HbA1C < 7%.    Hypertension Yes    Intervention Provide education on lifestyle modifcations including regular physical activity/exercise, weight management, moderate sodium restriction and increased consumption of fresh fruit, vegetables, and low fat dairy, alcohol moderation, and smoking cessation.;Monitor prescription use compliance.    Expected Outcomes Short Term: Continued assessment and intervention until BP is < 140/66mm HG in hypertensive participants. < 130/2mm HG in hypertensive participants with diabetes, heart failure or chronic kidney disease.;Long Term: Maintenance of blood pressure at goal levels.            Exercise Goals and Review:  Exercise Goals     Row Name 01/14/23 1526             Exercise Goals   Increase Physical Activity Yes       Intervention Provide advice, education, support and counseling about physical activity/exercise needs.;Develop an individualized exercise prescription for aerobic and resistive training based on initial evaluation findings, risk stratification, comorbidities and participant's personal goals.       Expected Outcomes Short Term: Attend rehab  on a regular basis to increase amount of physical activity.;Long Term: Exercising regularly at least 3-5 days a week.;Long Term: Add in home exercise to make exercise part of routine and to increase amount of physical activity.       Increase Strength and Stamina Yes       Intervention Develop an individualized exercise prescription for aerobic and resistive training based on initial evaluation findings, risk stratification, comorbidities and participant's personal goals.;Provide advice, education, support and counseling about physical activity/exercise needs.       Expected Outcomes Short Term: Increase workloads from initial exercise prescription for resistance, speed, and METs.;Short Term: Perform resistance training exercises routinely during rehab and add in resistance training at home;Long Term: Improve cardiorespiratory fitness, muscular endurance and strength as measured by increased METs and functional capacity ( )       Able to understand and use rate of perceived exertion (RPE) scale Yes       Intervention Provide education and explanation on how to use RPE scale       Expected Outcomes Short Term: Able to use RPE daily in rehab to express subjective intensity level;Long Term:  Able to use RPE to  guide intensity level when exercising independently       Able to understand and use Dyspnea scale Yes       Intervention Provide education and explanation on how to use Dyspnea scale       Expected Outcomes Short Term: Able to use Dyspnea scale daily in rehab to express subjective sense of shortness of breath during exertion;Long Term: Able to use Dyspnea scale to guide intensity level when exercising independently       Knowledge and understanding of Target Heart Rate Range (THRR) Yes       Intervention Provide education and explanation of THRR including how the numbers were predicted and where they are located for reference       Expected Outcomes Short Term: Able to state/look up THRR;Long  Term: Able to use THRR to govern intensity when exercising independently;Short Term: Able to use daily as guideline for intensity in rehab       Able to check pulse independently Yes       Intervention Provide education and demonstration on how to check pulse in carotid and radial arteries.;Review the importance of being able to check your own pulse for safety during independent exercise       Expected Outcomes Short Term: Able to explain why pulse checking is important during independent exercise;Long Term: Able to check pulse independently and accurately       Understanding of Exercise Prescription Yes       Intervention Provide education, explanation, and written materials on patient's individual exercise prescription       Expected Outcomes Short Term: Able to explain program exercise prescription;Long Term: Able to explain home exercise prescription to exercise independently

## 2023-01-16 ENCOUNTER — Encounter: Payer: Self-pay | Admitting: *Deleted

## 2023-01-16 DIAGNOSIS — U099 Post covid-19 condition, unspecified: Secondary | ICD-10-CM

## 2023-01-16 NOTE — Progress Notes (Signed)
Pulmonary Individual Treatment Plan  Patient Details  Name: Rebecca Kirby MRN: [id number] Date of Birth: 1946/02/16 Referring Provider:   Flowsheet Row Pulmonary Rehab from 01/14/2023 in [facility] Cardiac and Pulmonary Rehab  Referring Provider Dr. Vida Rigger, MD       Initial Encounter Date:  Flowsheet Row Pulmonary Rehab from 01/14/2023 in [facility] Cardiac and Pulmonary Rehab  Date 01/14/23       Visit Diagnosis: Post-COVID chronic dyspnea  Patient's Home Medications on Admission:  Current Outpatient Medications:    Accu-Chek FastClix Lancets MISC, USE 1 EACH FOUR TIMES DAILY. USE AS INSTRUCTED, Disp: , Rfl:    acetaminophen (TYLENOL) 500 MG tablet, Take 500-1,000 mg by mouth 2 (two) times daily., Disp: , Rfl:    acetaminophen (TYLENOL) 500 MG tablet, Take by mouth., Disp: , Rfl:    albuterol (VENTOLIN HFA) 108 (90 Base) MCG/ACT inhaler, Inhale 1 puff into the lungs every 6 (six) hours as needed for wheezing or shortness of breath., Disp: 18 g, Rfl: 0   ALPRAZolam (XANAX) 0.25 MG tablet, Take by mouth., Disp: , Rfl:    B-D UF III MINI PEN NEEDLES 31G X 5 MM MISC, 4 (four) times daily. as directed, Disp: , Rfl:    benzonatate (TESSALON) 200 MG capsule, , Disp: , Rfl:    calcium acetate (PHOSLO) 667 MG capsule, Take 1 capsule by mouth 2 (two) times daily., Disp: , Rfl:    cefUROXime (CEFTIN) 250 MG tablet, Take by mouth. (Patient not taking: Reported on 01/10/2023), Disp: , Rfl:    cephALEXin (KEFLEX) 250 MG capsule, , Disp: , Rfl:    chlorpheniramine-HYDROcodone (TUSSIONEX) 10-8 MG/5ML, SHAKE LIQUID AND TAKE 5 ML BY MOUTH EVERY 12 HOURS AS NEEDED FOR COUGH, Disp: , Rfl:    citalopram (CELEXA) 40 MG tablet, Take 40 mg by mouth daily., Disp: , Rfl:    citalopram (CELEXA) 40 MG tablet, Take 1 tablet by mouth daily., Disp: , Rfl:    dexamethasone (DECADRON) 2 MG tablet, TAKE 1 TABLET DAILY WITH BREAKFAST FOR 20 DAYS THEN TAKE 1/2 TABLET DAILY FOR 10 DAYS, Disp: , Rfl:    empagliflozin  (JARDIANCE) 10 MG TABS tablet, Take 1 tablet by mouth daily., Disp: , Rfl:    Ensure Max Protein (ENSURE MAX PROTEIN) LIQD, Take 330 mLs (11 oz total) by mouth 2 (two) times daily between meals. (Patient not taking: Reported on 01/10/2023), Disp: 660 mL, Rfl: 0   Ferrous Fum-Iron Polysacch 53-53 MG CAPS, Take by mouth., Disp: , Rfl:    ferrous sulfate 325 (65 FE) MG tablet, Take 1 tablet (325 mg total) by mouth daily., Disp: 30 tablet, Rfl: 0   fexofenadine (ALLEGRA) 180 MG tablet, Take by mouth., Disp: , Rfl:    fluconazole (DIFLUCAN) 150 MG tablet, Take by mouth., Disp: , Rfl:    fluticasone (FLONASE) 50 MCG/ACT nasal spray, Place 2 sprays into both nostrils daily. (Patient not taking: Reported on 01/10/2023), Disp: , Rfl:    fluticasone (FLONASE) 50 MCG/ACT nasal spray, Place into the nose., Disp: , Rfl:    furosemide (LASIX) 40 MG tablet, TAKE 1 TABLET BY MOUTH ONCE DAILY AS NEEDED FOR EDEMA HOLD IF SYSTOLIC BLOOD PRESSURE<100, Disp: , Rfl:    HUMALOG KWIKPEN 100 UNIT/ML KwikPen, Inject 0.08 mLs (8 Units total) into the skin 3 (three) times daily. Per sliding scale, Disp: 15 mL, Rfl: 11   HYDROcodone-acetaminophen (NORCO) 10-325 MG tablet, Take by mouth., Disp: , Rfl:    ipratropium-albuterol (DUONEB) 0.5-2.5 (3) MG/3ML SOLN,  Inhale into the lungs., Disp: , Rfl:    LAGEVRIO 200 MG CAPS capsule, TAKE FOUR CAPSULES BY MOUTH TWO TIMES A DAY FOR FIVE DAYS (Patient not taking: Reported on 01/10/2023), Disp: , Rfl:    levothyroxine (SYNTHROID) 100 MCG tablet, Take by mouth., Disp: , Rfl:    levothyroxine (SYNTHROID) 125 MCG tablet, Take by mouth., Disp: , Rfl:    levothyroxine (SYNTHROID, LEVOTHROID) 175 MCG tablet, Take 175 mcg by mouth daily before breakfast., Disp: , Rfl:    losartan (COZAAR) 25 MG tablet, Take 1 tablet by mouth at bedtime., Disp: , Rfl:    magnesium oxide (MAG-OX) 400 (240 Mg) MG tablet, , Disp: , Rfl:    magnesium oxide (MAG-OX) 400 MG tablet, Take by mouth., Disp: , Rfl:     metoprolol tartrate (LOPRESSOR) 25 MG tablet, , Disp: , Rfl:    pantoprazole (PROTONIX) 40 MG tablet, Take 40 mg by mouth daily., Disp: , Rfl:    predniSONE (DELTASONE) 10 MG tablet, , Disp: , Rfl:    propranolol (INDERAL) 40 MG tablet, , Disp: , Rfl:    sulfamethoxazole-trimethoprim (BACTRIM) 400-80 MG tablet, TAKE 1 TABLET BY MOUTH DAILY FOR 21 DAYS (Patient not taking: Reported on 01/10/2023), Disp: , Rfl:    TRESIBA FLEXTOUCH 200 UNIT/ML SOPN, Inject 26 Units into the skin every evening., Disp: , Rfl:    verapamil (CALAN-SR) 120 MG CR tablet, Take by mouth., Disp: , Rfl:    vitamin B-12 (CYANOCOBALAMIN) 1000 MCG tablet, Take 1,000 mcg by mouth daily. Reported on 07/07/2015, Disp: , Rfl:   Past Medical History: Past Medical History:  Diagnosis Date   Anemia    Anxiety    Colon cancer (HCC)    Diabetes mellitus without complication (HCC)    Family history of adverse reaction to anesthesia    daughter had reaction to anesthesia and was in icu   GERD (gastroesophageal reflux disease)    Hypertension    Hypothyroidism    Kidney cancer, primary, with metastasis from kidney to other site [facility]) 08/26/2014   right   Pancreas cancer (HCC) 2018   Thyroid cancer (HCC)     Tobacco Use: Social History   Tobacco Use  Smoking Status Former   Current packs/day: 0.00   Types: Cigarettes   Quit date: 07/29/1994   Years since quitting: 28.4  Smokeless Tobacco Never    Labs: Review Flowsheet       Latest Ref Rng & Units 07/29/2014 03/19/2019  Labs for ITP Cardiac and Pulmonary Rehab  Trlycerides <150 mg/dL - 295   Hemoglobin M8U 4.8 - 5.6 % 7.3  7.4     Details             Pulmonary Assessment Scores:  Pulmonary Assessment Scores     Row Name 01/14/23 1516         ADL UCSD   ADL Phase Entry     SOB Score total 81     Rest 1     Walk 3     Stairs 5     Bath 2     Dress 3     Shop 3       CAT Score   CAT Score 23       mMRC Score   mMRC Score 2               UCSD: Self-administered rating of dyspnea associated with activities of daily living (ADLs) 6-point scale (0 = "not at all" to  5 = "maximal or unable to do because of breathlessness")  Scoring Scores range from 0 to 120.  Minimally important difference is 5 units  CAT: CAT can identify the health impairment of COPD patients and is better correlated with disease progression.  CAT has a scoring range of zero to 40. The CAT score is classified into four groups of low (less than 10), medium (10 - 20), high (21-30) and very high (31-40) based on the impact level of disease on health status. A CAT score over 10 suggests significant symptoms.  A worsening CAT score could be explained by an exacerbation, poor medication adherence, poor inhaler technique, or progression of COPD or comorbid conditions.  CAT MCID is 2 points  mMRC: mMRC (Modified Medical Research Council) Dyspnea Scale is used to assess the degree of baseline functional disability in patients of respiratory disease due to dyspnea. No minimal important difference is established. A decrease in score of 1 point or greater is considered a positive change.   Pulmonary Function Assessment:  Pulmonary Function Assessment - 01/10/23 0929       Breath   Shortness of Breath Yes;Limiting activity             Exercise Target Goals: Exercise Program Goal: Individual exercise prescription set using results from initial 6 min walk test and THRR while considering  patient's activity barriers and safety.   Exercise Prescription Goal: Initial exercise prescription builds to 30-45 minutes a day of aerobic activity, 2-3 days per week.  Home exercise guidelines will be given to patient during program as part of exercise prescription that the participant will acknowledge.  Education: Aerobic Exercise: - Group verbal and visual presentation on the components of exercise prescription. Introduces F.I.T.T principle from ACSM for exercise  prescriptions.  Reviews F.I.T.T. principles of aerobic exercise including progression. Written material given at graduation.   Education: Resistance Exercise: - Group verbal and visual presentation on the components of exercise prescription. Introduces F.I.T.T principle from ACSM for exercise prescriptions  Reviews F.I.T.T. principles of resistance exercise including progression. Written material given at graduation.    Education: Exercise & Equipment Safety: - Individual verbal instruction and demonstration of equipment use and safety with use of the equipment. Flowsheet Row Pulmonary Rehab from 01/14/2023 in [facility] Cardiac and Pulmonary Rehab  Date 01/10/23  Educator [facility]  Instruction Review Code 1- Verbalizes Understanding       Education: Exercise Physiology & General Exercise Guidelines: - Group verbal and written instruction with models to review the exercise physiology of the cardiovascular system and associated critical values. Provides general exercise guidelines with specific guidelines to those with heart or lung disease.    Education: Flexibility, Balance, Mind/Body Relaxation: - Group verbal and visual presentation with interactive activity on the components of exercise prescription. Introduces F.I.T.T principle from ACSM for exercise prescriptions. Reviews F.I.T.T. principles of flexibility and balance exercise training including progression. Also discusses the mind body connection.  Reviews various relaxation techniques to help reduce and manage stress (i.e. Deep breathing, progressive muscle relaxation, and visualization). Balance handout provided to take home. Written material given at graduation.   Activity Barriers & Risk Stratification:  Activity Barriers & Cardiac Risk Stratification - 01/14/23 1526       Activity Barriers & Cardiac Risk Stratification   Activity Barriers Left Knee Replacement;Right Knee Replacement;Balance Concerns;History of Falls;Shortness of Breath              6 Minute Walk:  6 Minute Walk     Row  Name 01/14/23 1520         6 Minute Walk   Phase Initial     Distance 560 feet     Walk Time 4.08 minutes     # of Rest Breaks 1     MPH 1.56     METS 1.23     RPE 13     Perceived Dyspnea  2     VO2 Peak 4.31     Symptoms Yes (comment)     Comments SOB     Resting HR 96 bpm     Resting BP 122/62     Resting Oxygen Saturation  98 %     Exercise Oxygen Saturation  during 6 min walk 91 %     Max Ex. HR 137 bpm     Max Ex. BP 148/66     2 Minute Post BP 136/62       Interval HR   1 Minute HR 114     2 Minute HR 127     3 Minute HR 137     4 Minute HR 117     5 Minute HR 109     6 Minute HR 113     2 Minute Post HR 102     Interval Heart Rate? Yes       Interval Oxygen   Interval Oxygen? Yes     Baseline Oxygen Saturation % 98 %     1 Minute Oxygen Saturation % 91 %     1 Minute Liters of Oxygen 2 L  Pulsed     2 Minute Oxygen Saturation % 91 %     2 Minute Liters of Oxygen 2 L     3 Minute Oxygen Saturation % 92 %     3 Minute Liters of Oxygen 2 L     4 Minute Oxygen Saturation % 92 %     4 Minute Liters of Oxygen 2 L     5 Minute Oxygen Saturation % 96 %     5 Minute Liters of Oxygen 2 L     6 Minute Oxygen Saturation % 93 %     6 Minute Liters of Oxygen 2 L     2 Minute Post Oxygen Saturation % 98 %     2 Minute Post Liters of Oxygen 2 L             Oxygen Initial Assessment:  Oxygen Initial Assessment - 01/10/23 0927       Home Oxygen   Home Oxygen Device Home Concentrator;Portable Concentrator    Sleep Oxygen Prescription Continuous    Liters per minute 2    Home Exercise Oxygen Prescription Continuous    Liters per minute 2    Home Resting Oxygen Prescription Continuous    Liters per minute 2    Compliance with Home Oxygen Use Yes      Initial 6 min Walk   Oxygen Used Continuous;Portable Concentrator    Liters per minute 2      Program Oxygen Prescription   Program Oxygen  Prescription Continuous    Liters per minute 2      Intervention   Short Term Goals To learn and exhibit compliance with exercise, home and travel O2 prescription;To learn and understand importance of monitoring SPO2 with pulse oximeter and demonstrate accurate use of the pulse oximeter.;To learn and understand importance of maintaining oxygen saturations>88%;To learn and demonstrate proper pursed lip breathing techniques or other breathing  techniques. ;To learn and demonstrate proper use of respiratory medications    Long  Term Goals Exhibits compliance with exercise, home  and travel O2 prescription;Verbalizes importance of monitoring SPO2 with pulse oximeter and return demonstration;Maintenance of O2 saturations>88%;Exhibits proper breathing techniques, such as pursed lip breathing or other method taught during program session;Compliance with respiratory medication;Demonstrates proper use of MDI's             Oxygen Re-Evaluation:   Oxygen Discharge (Final Oxygen Re-Evaluation):   Initial Exercise Prescription:  Initial Exercise Prescription - 01/14/23 1500       Date of Initial Exercise RX and Referring Provider   Date 01/14/23    Referring Provider Dr. Vida Rigger, MD      Oxygen   Oxygen Continuous    Liters 2    Maintain Oxygen Saturation 88% or higher      Recumbant Bike   Level 1    RPM 50    Watts 15    Minutes 15    METs 1.23      NuStep   Level 1    SPM 80    Minutes 15    METs 1.23      Track   Laps 10    Minutes 15    METs 1.54      Prescription Details   Frequency (times per week) 2    Duration Progress to 30 minutes of continuous aerobic without signs/symptoms of physical distress      Intensity   THRR 40-80% of Max Heartrate 114-132    Ratings of Perceived Exertion 11-13    Perceived Dyspnea 0-4      Progression   Progression Continue to progress workloads to maintain intensity without signs/symptoms of physical distress.       Resistance Training   Training Prescription Yes    Weight 4 lb    Reps 10-15             Perform Capillary Blood Glucose checks as needed.  Exercise Prescription Changes:   Exercise Prescription Changes     Row Name 01/14/23 1500             Response to Exercise   Blood Pressure (Admit) 122/62       Blood Pressure (Exercise) 148/66       Blood Pressure (Exit) 136/62       Heart Rate (Admit) 96 bpm       Heart Rate (Exercise) 137 bpm       Heart Rate (Exit) 102 bpm       Oxygen Saturation (Admit) 98 %       Oxygen Saturation (Exercise) 91 %       Oxygen Saturation (Exit) 98 %       Rating of Perceived Exertion (Exercise) 13       Perceived Dyspnea (Exercise) 2       Symptoms SOB       Comments Results                Exercise Comments:   Exercise Goals and Review:   Exercise Goals     Row Name 01/14/23 1526             Exercise Goals   Increase Physical Activity Yes       Intervention Provide advice, education, support and counseling about physical activity/exercise needs.;Develop an individualized exercise prescription for aerobic and resistive training based on initial evaluation findings, risk stratification, comorbidities and participant's personal goals.  Expected Outcomes Short Term: Attend rehab on a regular basis to increase amount of physical activity.;Long Term: Exercising regularly at least 3-5 days a week.;Long Term: Add in home exercise to make exercise part of routine and to increase amount of physical activity.       Increase Strength and Stamina Yes       Intervention Develop an individualized exercise prescription for aerobic and resistive training based on initial evaluation findings, risk stratification, comorbidities and participant's personal goals.;Provide advice, education, support and counseling about physical activity/exercise needs.       Expected Outcomes Short Term: Increase workloads from initial exercise prescription  for resistance, speed, and METs.;Short Term: Perform resistance training exercises routinely during rehab and add in resistance training at home;Long Term: Improve cardiorespiratory fitness, muscular endurance and strength as measured by increased METs and functional capacity ( )       Able to understand and use rate of perceived exertion (RPE) scale Yes       Intervention Provide education and explanation on how to use RPE scale       Expected Outcomes Short Term: Able to use RPE daily in rehab to express subjective intensity level;Long Term:  Able to use RPE to guide intensity level when exercising independently       Able to understand and use Dyspnea scale Yes       Intervention Provide education and explanation on how to use Dyspnea scale       Expected Outcomes Short Term: Able to use Dyspnea scale daily in rehab to express subjective sense of shortness of breath during exertion;Long Term: Able to use Dyspnea scale to guide intensity level when exercising independently       Knowledge and understanding of Target Heart Rate Range (THRR) Yes       Intervention Provide education and explanation of THRR including how the numbers were predicted and where they are located for reference       Expected Outcomes Short Term: Able to state/look up THRR;Long Term: Able to use THRR to govern intensity when exercising independently;Short Term: Able to use daily as guideline for intensity in rehab       Able to check pulse independently Yes       Intervention Provide education and demonstration on how to check pulse in carotid and radial arteries.;Review the importance of being able to check your own pulse for safety during independent exercise       Expected Outcomes Short Term: Able to explain why pulse checking is important during independent exercise;Long Term: Able to check pulse independently and accurately       Understanding of Exercise Prescription Yes       Intervention Provide education,  explanation, and written materials on patient's individual exercise prescription       Expected Outcomes Short Term: Able to explain program exercise prescription;Long Term: Able to explain home exercise prescription to exercise independently                Exercise Goals Re-Evaluation :   Discharge Exercise Prescription (Final Exercise Prescription Changes):  Exercise Prescription Changes - 01/14/23 1500       Response to Exercise   Blood Pressure (Admit) 122/62    Blood Pressure (Exercise) 148/66    Blood Pressure (Exit) 136/62    Heart Rate (Admit) 96 bpm    Heart Rate (Exercise) 137 bpm    Heart Rate (Exit) 102 bpm    Oxygen Saturation (Admit) 98 %    Oxygen  Saturation (Exercise) 91 %    Oxygen Saturation (Exit) 98 %    Rating of Perceived Exertion (Exercise) 13    Perceived Dyspnea (Exercise) 2    Symptoms SOB    Comments Results             Nutrition:  Target Goals: Understanding of nutrition guidelines, daily intake of sodium 1500mg , cholesterol 200mg , calories 30% from fat and 7% or less from saturated fats, daily to have 5 or more servings of fruits and vegetables.  Education: All About Nutrition: -Group instruction provided by verbal, written material, interactive activities, discussions, models, and posters to present general guidelines for heart healthy nutrition including fat, fiber, MyPlate, the role of sodium in heart healthy nutrition, utilization of the nutrition label, and utilization of this knowledge for meal planning. Follow up email sent as well. Written material given at graduation.   Biometrics:  Pre Biometrics - 01/14/23 1527       Pre Biometrics   Height 5' 5.5" (1.664 m)    Weight 211 lb (95.7 kg)    Waist Circumference 43.5 inches    Hip Circumference 50 inches    Waist to Hip Ratio 0.87 %    BMI (Calculated) 34.57    Single Leg Stand 2.85 seconds              Nutrition Therapy Plan and Nutrition Goals:  Nutrition  Therapy & Goals - 01/14/23 1514       Nutrition Therapy   RD appointment deferred Yes      Intervention Plan   Intervention Prescribe, educate and counsel regarding individualized specific dietary modifications aiming towards targeted core components such as weight, hypertension, lipid management, diabetes, heart failure and other comorbidities.    Expected Outcomes Short Term Goal: Understand basic principles of dietary content, such as calories, fat, sodium, cholesterol and nutrients.;Short Term Goal: A plan has been developed with personal nutrition goals set during dietitian appointment.;Long Term Goal: Adherence to prescribed nutrition plan.             Nutrition Assessments:  MEDIFICTS Score Key: >=70 Need to make dietary changes  40-70 Heart Healthy Diet <= 40 Therapeutic Level Cholesterol Diet  Flowsheet Row Pulmonary Rehab from 01/14/2023 in [facility] Cardiac and Pulmonary Rehab  Picture Your Plate Total Score on Admission 46      Picture Your Plate Scores: <69 Unhealthy dietary pattern with much room for improvement. 41-50 Dietary pattern unlikely to meet recommendations for good health and room for improvement. 51-60 More healthful dietary pattern, with some room for improvement.  >60 Healthy dietary pattern, although there may be some specific behaviors that could be improved.   Nutrition Goals Re-Evaluation:   Nutrition Goals Discharge (Final Nutrition Goals Re-Evaluation):   Psychosocial: Target Goals: Acknowledge presence or absence of significant depression and/or stress, maximize coping skills, provide positive support system. Participant is able to verbalize types and ability to use techniques and skills needed for reducing stress and depression.   Education: Stress, Anxiety, and Depression - Group verbal and visual presentation to define topics covered.  Reviews how body is impacted by stress, anxiety, and depression.  Also discusses healthy ways to reduce  stress and to treat/manage anxiety and depression.  Written material given at graduation.   Education: Sleep Hygiene -Provides group verbal and written instruction about how sleep can affect your health.  Define sleep hygiene, discuss sleep cycles and impact of sleep habits. Review good sleep hygiene tips.    Initial Review &  Psychosocial Screening:  Initial Psych Review & Screening - 01/10/23 0931       Initial Review   Current issues with Current Psychotropic Meds;Current Depression;History of Depression      Family Dynamics   Good Support System? Yes    Comments Dondra Spry has a lot of friends, chruch and family that support her. She does take medication for depression. She is ready to get started in the program since her last bout with Covid she has been more short of breath.      Barriers   Psychosocial barriers to participate in program The patient should benefit from training in stress management and relaxation.      Screening Interventions   Interventions Encouraged to exercise;To provide support and resources with identified psychosocial needs;Provide feedback about the scores to participant    Expected Outcomes Short Term goal: Utilizing psychosocial counselor, staff and physician to assist with identification of specific Stressors or current issues interfering with healing process. Setting desired goal for each stressor or current issue identified.;Long Term Goal: Stressors or current issues are controlled or eliminated.;Short Term goal: Identification and review with participant of any Quality of Life or Depression concerns found by scoring the questionnaire.;Long Term goal: The participant improves quality of Life and PHQ9 Scores as seen by post scores and/or verbalization of changes             Quality of Life Scores:  Scores of 19 and below usually indicate a poorer quality of life in these areas.  A difference of  2-3 points is a clinically meaningful difference.  A  difference of 2-3 points in the total score of the Quality of Life Index has been associated with significant improvement in overall quality of life, self-image, physical symptoms, and general health in studies assessing change in quality of life.  PHQ-9: Review Flowsheet       01/14/2023  Depression screen PHQ 2/9  Decreased Interest 0  Down, Depressed, Hopeless 0  PHQ - 2 Score 0  Altered sleeping 2  Tired, decreased energy 0  Change in appetite 0  Feeling bad or failure about yourself  0  Trouble concentrating 1  Moving slowly or fidgety/restless 0  Suicidal thoughts 1  PHQ-9 Score 4  Difficult doing work/chores Somewhat difficult    Details           Interpretation of Total Score  Total Score Depression Severity:  1-4 = Minimal depression, 5-9 = Mild depression, 10-14 = Moderate depression, 15-19 = Moderately severe depression, 20-27 = Severe depression   Psychosocial Evaluation and Intervention:  Psychosocial Evaluation - 01/10/23 0933       Psychosocial Evaluation & Interventions   Interventions Encouraged to exercise with the program and follow exercise prescription;Stress management education;Relaxation education    Comments Dondra Spry has a lot of friends, chruch and family that support her. She does take medication for depression. She is ready to get started in the program since her last bout with Covid she has been more short of breath.    Expected Outcomes Short: Start [facility] to help with mood. Long: Maintain a healthy mental state.    Continue Psychosocial Services  Follow up required by staff             Psychosocial Re-Evaluation:   Psychosocial Discharge (Final Psychosocial Re-Evaluation):   Education: Education Goals: Education classes will be provided on a weekly basis, covering required topics. Participant will state understanding/return demonstration of topics presented.  Learning Barriers/Preferences:  Learning Barriers/Preferences -  01/10/23  0930       Learning Barriers/Preferences   Learning Barriers Sight    Learning Preferences None             General Pulmonary Education Topics:  Infection Prevention: - Provides verbal and written material to individual with discussion of infection control including proper hand washing and proper equipment cleaning during exercise session. Flowsheet Row Pulmonary Rehab from 01/14/2023 in [facility] Cardiac and Pulmonary Rehab  Date 01/10/23  Educator [facility]  Instruction Review Code 1- Verbalizes Understanding       Falls Prevention: - Provides verbal and written material to individual with discussion of falls prevention and safety. Flowsheet Row Pulmonary Rehab from 01/14/2023 in [facility] Cardiac and Pulmonary Rehab  Date 01/10/23  Educator [facility]  Instruction Review Code 1- Verbalizes Understanding       Chronic Lung Disease Review: - Group verbal instruction with posters, models, PowerPoint presentations and videos,  to review new updates, new respiratory medications, new advancements in procedures and treatments. Providing information on websites and "800" numbers for continued self-education. Includes information about supplement oxygen, available portable oxygen systems, continuous and intermittent flow rates, oxygen safety, concentrators, and Medicare reimbursement for oxygen. Explanation of Pulmonary Drugs, including class, frequency, complications, importance of spacers, rinsing mouth after steroid MDI's, and proper cleaning methods for nebulizers. Review of basic lung anatomy and physiology related to function, structure, and complications of lung disease. Review of risk factors. Discussion about methods for diagnosing sleep apnea and types of masks and machines for OSA. Includes a review of the use of types of environmental controls: home humidity, furnaces, filters, dust mite/pet prevention, HEPA vacuums. Discussion about weather changes, air quality and the benefits of nasal washing.  Instruction on Warning signs, infection symptoms, calling MD promptly, preventive modes, and value of vaccinations. Review of effective airway clearance, coughing and/or vibration techniques. Emphasizing that all should Create an Action Plan. Written material given at graduation. Flowsheet Row Pulmonary Rehab from 01/14/2023 in [facility] Cardiac and Pulmonary Rehab  Education need identified 01/14/23       AED/CPR: - Group verbal and written instruction with the use of models to demonstrate the basic use of the AED with the basic ABC's of resuscitation.    Anatomy and Cardiac Procedures: - Group verbal and visual presentation and models provide information about basic cardiac anatomy and function. Reviews the testing methods done to diagnose heart disease and the outcomes of the test results. Describes the treatment choices: Medical Management, Angioplasty, or Coronary Bypass Surgery for treating various heart conditions including Myocardial Infarction, Angina, Valve Disease, and Cardiac Arrhythmias.  Written material given at graduation.   Medication Safety: - Group verbal and visual instruction to review commonly prescribed medications for heart and lung disease. Reviews the medication, class of the drug, and side effects. Includes the steps to properly store meds and maintain the prescription regimen.  Written material given at graduation.   Other: -Provides group and verbal instruction on various topics (see comments)   Knowledge Questionnaire Score:  Knowledge Questionnaire Score - 01/14/23 1516       Knowledge Questionnaire Score   Pre Score 15/18              Core Components/Risk Factors/Patient Goals at Admission:  Personal Goals and Risk Factors at Admission - 01/10/23 0930       Core Components/Risk Factors/Patient Goals on Admission    Weight Management Yes;Weight Maintenance    Intervention Weight Management: Develop a combined nutrition and exercise program  designed  to reach desired caloric intake, while maintaining appropriate intake of nutrient and fiber, sodium and fats, and appropriate energy expenditure required for the weight goal.;Weight Management: Provide education and appropriate resources to help participant work on and attain dietary goals.;Weight Management/Obesity: Establish reasonable short term and long term weight goals.    Expected Outcomes Short Term: Continue to assess and modify interventions until short term weight is achieved;Long Term: Adherence to nutrition and physical activity/exercise program aimed toward attainment of established weight goal;Weight Maintenance: Understanding of the daily nutrition guidelines, which includes 25-35% calories from fat, 7% or less cal from saturated fats, less than 200mg  cholesterol, less than 1.5gm of sodium, & 5 or more servings of fruits and vegetables daily;Understanding recommendations for meals to include 15-35% energy as protein, 25-35% energy from fat, 35-60% energy from carbohydrates, less than 200mg  of dietary cholesterol, 20-35 gm of total fiber daily;Understanding of distribution of calorie intake throughout the day with the consumption of 4-5 meals/snacks    Improve shortness of breath with ADL's Yes    Intervention Provide education, individualized exercise plan and daily activity instruction to help decrease symptoms of SOB with activities of daily living.    Expected Outcomes Short Term: Improve cardiorespiratory fitness to achieve a reduction of symptoms when performing ADLs;Long Term: Be able to perform more ADLs without symptoms or delay the onset of symptoms    Diabetes Yes    Intervention Provide education about signs/symptoms and action to take for hypo/hyperglycemia.;Provide education about proper nutrition, including hydration, and aerobic/resistive exercise prescription along with prescribed medications to achieve blood glucose in normal ranges: Fasting glucose 65-99 mg/dL    Expected  Outcomes Short Term: Participant verbalizes understanding of the signs/symptoms and immediate care of hyper/hypoglycemia, proper foot care and importance of medication, aerobic/resistive exercise and nutrition plan for blood glucose control.;Long Term: Attainment of HbA1C < 7%.    Hypertension Yes    Intervention Provide education on lifestyle modifcations including regular physical activity/exercise, weight management, moderate sodium restriction and increased consumption of fresh fruit, vegetables, and low fat dairy, alcohol moderation, and smoking cessation.;Monitor prescription use compliance.    Expected Outcomes Short Term: Continued assessment and intervention until BP is < 140/93mm HG in hypertensive participants. < 130/24mm HG in hypertensive participants with diabetes, heart failure or chronic kidney disease.;Long Term: Maintenance of blood pressure at goal levels.             Education:Diabetes - Individual verbal and written instruction to review signs/symptoms of diabetes, desired ranges of glucose level fasting, after meals and with exercise. Acknowledge that pre and post exercise glucose checks will be done for 3 sessions at entry of program. Flowsheet Row Pulmonary Rehab from 01/14/2023 in [facility] Cardiac and Pulmonary Rehab  Date 01/10/23  Educator [facility]  Instruction Review Code 1- Verbalizes Understanding       Know Your Numbers and Heart Failure: - Group verbal and visual instruction to discuss disease risk factors for cardiac and pulmonary disease and treatment options.  Reviews associated critical values for Overweight/Obesity, Hypertension, Cholesterol, and Diabetes.  Discusses basics of heart failure: signs/symptoms and treatments.  Introduces Heart Failure Zone chart for action plan for heart failure.  Written material given at graduation. Flowsheet Row Pulmonary Rehab from 01/14/2023 in [facility] Cardiac and Pulmonary Rehab  Education need identified 01/14/23       Core  Components/Risk Factors/Patient Goals Review:    Core Components/Risk Factors/Patient Goals at Discharge (Final Review):    ITP Comments:  ITP Comments  Row Name 01/10/23 8657 01/14/23 1510 01/16/23 0948       ITP Comments Virtual Visit completed. Patient informed on EP and RD appointment and 6 Minute walk test. Patient also informed of patient health questionnaires on My Chart. Patient Verbalizes understanding. Visit diagnosis can be found in [facility] 01/12/2023. Completed and gym orientation. Initial ITP created and sent for review to Dr. Jinny Sanders, Medical Director. 30 Day review completed. Medical Director ITP review done, changes made as directed, and signed approval by Medical Director.    new to program              Comments:

## 2023-01-17 ENCOUNTER — Encounter: Payer: 59 | Admitting: *Deleted

## 2023-01-17 DIAGNOSIS — R0609 Other forms of dyspnea: Secondary | ICD-10-CM | POA: Diagnosis not present

## 2023-01-17 DIAGNOSIS — U099 Post covid-19 condition, unspecified: Secondary | ICD-10-CM

## 2023-01-17 LAB — GLUCOSE, CAPILLARY
Glucose-Capillary: 194 mg/dL — ABNORMAL HIGH (ref 70–99)
Glucose-Capillary: 137 mg/dL — ABNORMAL HIGH (ref 70–99)

## 2023-01-17 NOTE — Progress Notes (Signed)
Daily Session Note  Patient Details  Name: Rebecca Kirby MRN: [id number] Date of Birth: [birth year] Referring Provider:   Flowsheet Row Pulmonary Rehab from 01/14/2023 in [facility] Cardiac and Pulmonary Rehab  Referring Provider Dr. Vida Rigger, MD       Encounter Date: 01/17/2023  Check In:  Session Check In - 01/17/23 1357       Check-In   Supervising physician immediately available to respond to emergencies See telemetry face sheet for immediately available ER MD    Location [facility]-Cardiac & Pulmonary Rehab    Staff Present Elige Ko, RCP,RRT,BSRT;Maxon Conetta BS, , Exercise Physiologist;Johnisha Louks Katrinka Blazing, RN, ADN    Virtual Visit No    Medication changes reported     No    Fall or balance concerns reported    No    Warm-up and Cool-down Performed on first and last piece of equipment    Resistance Training Performed Yes    VAD Patient? No    PAD/SET Patient? No      Pain Assessment   Currently in Pain? No/denies                Social History   Tobacco Use  Smoking Status Former   Current packs/day: 0.00   Types: Cigarettes   Quit date: 07/29/1994   Years since quitting: 28.4  Smokeless Tobacco Never    Goals Met:  Independence with exercise equipment Exercise tolerated well No report of concerns or symptoms today Strength training completed today  Goals Unmet:  Not Applicable  Comments: First full day of exercise!  Patient was oriented to gym and equipment including functions, settings, policies, and procedures.  Patient's individual exercise prescription and treatment plan were reviewed.  All starting workloads were established based on the results of the 6 minute walk test done at initial orientation visit.  The plan for exercise progression was also introduced and progression will be customized based on patient's performance and goals.     Dr. Bethann Punches is Medical Director for [facility] Cardiac Rehabilitation.  Dr. Vida Rigger is Medical Director  for [facility] Pulmonary Rehabilitation.

## 2023-01-22 ENCOUNTER — Encounter: Payer: 59 | Admitting: *Deleted

## 2023-01-22 DIAGNOSIS — R0609 Other forms of dyspnea: Secondary | ICD-10-CM

## 2023-01-22 LAB — GLUCOSE, CAPILLARY
Glucose-Capillary: 217 mg/dL — ABNORMAL HIGH (ref 70–99)
Glucose-Capillary: 111 mg/dL — ABNORMAL HIGH (ref 70–99)

## 2023-01-22 NOTE — Progress Notes (Signed)
Daily Session Note  Patient Details  Name: Rebecca Kirby MRN: [id number] Date of Birth: 21-Dec-1945 Referring Provider:   Flowsheet Row Pulmonary Rehab from 01/14/2023 in [facility] Cardiac and Pulmonary Rehab  Referring Provider Dr. Vida Rigger, MD       Encounter Date: 01/22/2023  Check In:  Session Check In - 01/22/23 0948       Check-In   Supervising physician immediately available to respond to emergencies See telemetry face sheet for immediately available ER MD    Location [facility]-Cardiac & Pulmonary Rehab    Staff Present Rory Percy, MS, Exercise Physiologist;Tyneshia Stivers, RN, BSN, CCRP;Noah Tickle, BS, Exercise Physiologist;Maxon Conetta BS, , Exercise Physiologist    Virtual Visit No    Medication changes reported     No    Fall or balance concerns reported    No    Warm-up and Cool-down Performed on first and last piece of equipment    Resistance Training Performed Yes    VAD Patient? No    PAD/SET Patient? No      Pain Assessment   Currently in Pain? No/denies                Social History   Tobacco Use  Smoking Status Former   Current packs/day: 0.00   Types: Cigarettes   Quit date: 07/29/1994   Years since quitting: 28.5  Smokeless Tobacco Never    Goals Met:  Proper associated with RPD/PD & O2 Sat Independence with exercise equipment Exercise tolerated well No report of concerns or symptoms today  Goals Unmet:  Not Applicable  Comments: Pt able to follow exercise prescription today without complaint.  Will continue to monitor for progression.    Dr. Bethann Punches is Medical Director for [facility] Cardiac Rehabilitation.  Dr. Vida Rigger is Medical Director for [facility] Pulmonary Rehabilitation.

## 2023-01-29 ENCOUNTER — Encounter: Payer: 59 | Attending: Pulmonary Disease | Admitting: *Deleted

## 2023-01-29 DIAGNOSIS — U099 Post covid-19 condition, unspecified: Secondary | ICD-10-CM | POA: Diagnosis present

## 2023-01-29 DIAGNOSIS — R0609 Other forms of dyspnea: Secondary | ICD-10-CM | POA: Insufficient documentation

## 2023-01-29 LAB — GLUCOSE, CAPILLARY
Glucose-Capillary: 181 mg/dL — ABNORMAL HIGH (ref 70–99)
Glucose-Capillary: 124 mg/dL — ABNORMAL HIGH (ref 70–99)

## 2023-01-29 NOTE — Progress Notes (Signed)
Daily Session Note  Patient Details  Name: Rebecca Kirby MRN: [id number] Date of Birth: [birth year] Referring Provider:   Flowsheet Row Pulmonary Rehab from 01/14/2023 in [facility] Cardiac and Pulmonary Rehab  Referring Provider Dr. Vida Rigger, MD       Encounter Date: 01/29/2023  Check In:  Session Check In - 01/29/23 0936       Check-In   Supervising physician immediately available to respond to emergencies See telemetry face sheet for immediately available ER MD    Location [facility]-Cardiac & Pulmonary Rehab    Staff Present Cora Collum, RN, BSN, CCRP;Noah Tickle, BS, Exercise Physiologist;Margaret Best, MS, Exercise Physiologist;Maxon Conetta BS, , Exercise Physiologist    Virtual Visit No    Medication changes reported     No    Fall or balance concerns reported    No    Warm-up and Cool-down Performed on first and last piece of equipment    Resistance Training Performed Yes    VAD Patient? No    PAD/SET Patient? No      Pain Assessment   Currently in Pain? No/denies                Social History   Tobacco Use  Smoking Status Former   Current packs/day: 0.00   Types: Cigarettes   Quit date: 07/29/1994   Years since quitting: 28.5  Smokeless Tobacco Never    Goals Met:  Proper associated with RPD/PD & O2 Sat Independence with exercise equipment Exercise tolerated well No report of concerns or symptoms today  Goals Unmet:  Not Applicable  Comments: Pt able to follow exercise prescription today without complaint.  Will continue to monitor for progression.    Dr. Bethann Punches is Medical Director for [facility] Cardiac Rehabilitation.  Dr. Vida Rigger is Medical Director for [facility] Pulmonary Rehabilitation.

## 2023-02-05 ENCOUNTER — Encounter: Payer: 59 | Admitting: *Deleted

## 2023-02-05 DIAGNOSIS — R0609 Other forms of dyspnea: Secondary | ICD-10-CM | POA: Diagnosis not present

## 2023-02-05 DIAGNOSIS — U099 Post covid-19 condition, unspecified: Secondary | ICD-10-CM

## 2023-02-05 NOTE — Progress Notes (Signed)
Daily Session Note  Patient Details  Name: Rebecca Kirby MRN: [id number] Date of Birth: Jan 07, 1946 Referring Provider:   Flowsheet Row Pulmonary Rehab from 01/14/2023 in [facility] Cardiac and Pulmonary Rehab  Referring Provider Dr. Vida Rigger, MD       Encounter Date: 02/05/2023  Check In:  Session Check In - 02/05/23 1000       Check-In   Supervising physician immediately available to respond to emergencies See telemetry face sheet for immediately available ER MD    Location [facility]-Cardiac & Pulmonary Rehab    Staff Present Cora Collum, RN, BSN, CCRP;Margaret Best, MS, Exercise Physiologist;Maxon Conetta BS, , Exercise Physiologist;Noah Tickle, BS, Exercise Physiologist    Virtual Visit No    Medication changes reported     No    Fall or balance concerns reported    No    Warm-up and Cool-down Performed on first and last piece of equipment    Resistance Training Performed Yes    VAD Patient? No    PAD/SET Patient? No      Pain Assessment   Currently in Pain? No/denies                Social History   Tobacco Use  Smoking Status Former   Current packs/day: 0.00   Types: Cigarettes   Quit date: 07/29/1994   Years since quitting: 28.5  Smokeless Tobacco Never    Goals Met:  Proper associated with RPD/PD & O2 Sat Independence with exercise equipment Exercise tolerated well No report of concerns or symptoms today  Goals Unmet:  Not Applicable  Comments: Pt able to follow exercise prescription today without complaint.  Will continue to monitor for progression.    Dr. Bethann Punches is Medical Director for [facility] Cardiac Rehabilitation.  Dr. Vida Rigger is Medical Director for [facility] Pulmonary Rehabilitation.

## 2023-02-07 ENCOUNTER — Encounter: Payer: 59 | Admitting: *Deleted

## 2023-02-07 DIAGNOSIS — R0609 Other forms of dyspnea: Secondary | ICD-10-CM

## 2023-02-07 NOTE — Progress Notes (Signed)
Daily Session Note  Patient Details  Name: Rebecca Kirby MRN: [id number] Date of Birth: [birth year] Referring Provider:   Flowsheet Row Pulmonary Rehab from 01/14/2023 in [facility] Cardiac and Pulmonary Rehab  Referring Provider Dr. Vida Rigger, MD       Encounter Date: 02/07/2023  Check In:  Session Check In - 02/07/23 1356       Check-In   Supervising physician immediately available to respond to emergencies See telemetry face sheet for immediately available ER MD    Location [facility]-Cardiac & Pulmonary Rehab    Staff Present Ronette Deter, BS, Exercise Physiologist;Maxon Conetta BS, , Exercise Physiologist;[place] Nunley Katrinka Blazing, RN, ADN;Susanne Bice, RN, BSN, CCRP    Virtual Visit No    Medication changes reported     No    Fall or balance concerns reported    No    Warm-up and Cool-down Performed on first and last piece of equipment    Resistance Training Performed Yes    VAD Patient? No    PAD/SET Patient? No      Pain Assessment   Currently in Pain? No/denies                Social History   Tobacco Use  Smoking Status Former   Current packs/day: 0.00   Types: Cigarettes   Quit date: 07/29/1994   Years since quitting: 28.5  Smokeless Tobacco Never    Goals Met:  Independence with exercise equipment Exercise tolerated well No report of concerns or symptoms today Strength training completed today  Goals Unmet:  Not Applicable  Comments: Pt able to follow exercise prescription today without complaint.  Will continue to monitor for progression.    Dr. Bethann Punches is Medical Director for [facility] Cardiac Rehabilitation.  Dr. Vida Rigger is Medical Director for [facility] Pulmonary Rehabilitation.

## 2023-02-12 ENCOUNTER — Encounter: Payer: 59 | Admitting: *Deleted

## 2023-02-12 DIAGNOSIS — R0609 Other forms of dyspnea: Secondary | ICD-10-CM

## 2023-02-12 NOTE — Progress Notes (Signed)
Daily Session Note  Patient Details  Name: Rebecca Kirby MRN: [id number] Date of Birth: 11-02-1945 Referring Provider:   Flowsheet Row Pulmonary Rehab from 01/14/2023 in [facility] Cardiac and Pulmonary Rehab  Referring Provider Dr. Vida Rigger, MD       Encounter Date: 02/12/2023  Check In:  Session Check In - 02/12/23 0925       Check-In   Supervising physician immediately available to respond to emergencies See telemetry face sheet for immediately available ER MD    Location [facility]-Cardiac & Pulmonary Rehab    Staff Present Rory Percy, MS, Exercise Physiologist;Maxon Conetta BS, , Exercise Physiologist;Annison Birchard, RN, BSN, CCRP;Noah Tickle, BS, Exercise Physiologist    Virtual Visit No    Medication changes reported     No    Fall or balance concerns reported    No    Warm-up and Cool-down Performed on first and last piece of equipment    Resistance Training Performed Yes    VAD Patient? No    PAD/SET Patient? No      Pain Assessment   Currently in Pain? No/denies                Social History   Tobacco Use  Smoking Status Former   Current packs/day: 0.00   Types: Cigarettes   Quit date: 07/29/1994   Years since quitting: 28.5  Smokeless Tobacco Never    Goals Met:  Proper associated with RPD/PD & O2 Sat Independence with exercise equipment Exercise tolerated well No report of concerns or symptoms today  Goals Unmet:  Not Applicable  Comments: Pt able to follow exercise prescription today without complaint.  Will continue to monitor for progression.    Dr. Bethann Punches is Medical Director for [facility] Cardiac Rehabilitation.  Dr. Vida Rigger is Medical Director for [facility] Pulmonary Rehabilitation.

## 2023-02-13 ENCOUNTER — Encounter: Payer: Self-pay | Admitting: *Deleted

## 2023-02-13 DIAGNOSIS — R0609 Other forms of dyspnea: Secondary | ICD-10-CM

## 2023-02-13 NOTE — Progress Notes (Signed)
Pulmonary Individual Treatment Plan  Patient Details  Name: Rebecca FESMIRE MRN: [id number] Date of Birth: 1945/11/23 Referring Provider:   Flowsheet Row Pulmonary Rehab from 01/14/2023 in [facility] Cardiac and Pulmonary Rehab  Referring Provider Dr. Vida Rigger, MD       Initial Encounter Date:  Flowsheet Row Pulmonary Rehab from 01/14/2023 in [facility] Cardiac and Pulmonary Rehab  Date 01/14/23       Visit Diagnosis: Post-COVID chronic dyspnea  Patient's Home Medications on Admission:  Current Outpatient Medications:    Accu-Chek FastClix Lancets MISC, USE 1 EACH FOUR TIMES DAILY. USE AS INSTRUCTED, Disp: , Rfl:    acetaminophen (TYLENOL) 500 MG tablet, Take 500-1,000 mg by mouth 2 (two) times daily., Disp: , Rfl:    acetaminophen (TYLENOL) 500 MG tablet, Take by mouth., Disp: , Rfl:    albuterol (VENTOLIN HFA) 108 (90 Base) MCG/ACT inhaler, Inhale 1 puff into the lungs every 6 (six) hours as needed for wheezing or shortness of breath., Disp: 18 g, Rfl: 0   ALPRAZolam (XANAX) 0.25 MG tablet, Take by mouth., Disp: , Rfl:    B-D UF III MINI PEN NEEDLES 31G X 5 MM MISC, 4 (four) times daily. as directed, Disp: , Rfl:    benzonatate (TESSALON) 200 MG capsule, , Disp: , Rfl:    calcium acetate (PHOSLO) 667 MG capsule, Take 1 capsule by mouth 2 (two) times daily., Disp: , Rfl:    cephALEXin (KEFLEX) 250 MG capsule, , Disp: , Rfl:    chlorpheniramine-HYDROcodone (TUSSIONEX) 10-8 MG/5ML, SHAKE LIQUID AND TAKE 5 ML BY MOUTH EVERY 12 HOURS AS NEEDED FOR COUGH, Disp: , Rfl:    citalopram (CELEXA) 40 MG tablet, Take 40 mg by mouth daily., Disp: , Rfl:    citalopram (CELEXA) 40 MG tablet, Take 1 tablet by mouth daily., Disp: , Rfl:    dexamethasone (DECADRON) 2 MG tablet, TAKE 1 TABLET DAILY WITH BREAKFAST FOR 20 DAYS THEN TAKE 1/2 TABLET DAILY FOR 10 DAYS, Disp: , Rfl:    empagliflozin (JARDIANCE) 10 MG TABS tablet, Take 1 tablet by mouth daily., Disp: , Rfl:    Ensure Max Protein (ENSURE MAX  PROTEIN) LIQD, Take 330 mLs (11 oz total) by mouth 2 (two) times daily between meals. (Patient not taking: Reported on 01/10/2023), Disp: 660 mL, Rfl: 0   Ferrous Fum-Iron Polysacch 53-53 MG CAPS, Take by mouth., Disp: , Rfl:    ferrous sulfate 325 (65 FE) MG tablet, Take 1 tablet (325 mg total) by mouth daily., Disp: 30 tablet, Rfl: 0   fexofenadine (ALLEGRA) 180 MG tablet, Take by mouth., Disp: , Rfl:    fluconazole (DIFLUCAN) 150 MG tablet, Take by mouth., Disp: , Rfl:    fluticasone (FLONASE) 50 MCG/ACT nasal spray, Place 2 sprays into both nostrils daily. (Patient not taking: Reported on 01/10/2023), Disp: , Rfl:    fluticasone (FLONASE) 50 MCG/ACT nasal spray, Place into the nose., Disp: , Rfl:    furosemide (LASIX) 40 MG tablet, TAKE 1 TABLET BY MOUTH ONCE DAILY AS NEEDED FOR EDEMA HOLD IF SYSTOLIC BLOOD PRESSURE<100, Disp: , Rfl:    HUMALOG KWIKPEN 100 UNIT/ML KwikPen, Inject 0.08 mLs (8 Units total) into the skin 3 (three) times daily. Per sliding scale, Disp: 15 mL, Rfl: 11   HYDROcodone-acetaminophen (NORCO) 10-325 MG tablet, Take by mouth., Disp: , Rfl:    ipratropium-albuterol (DUONEB) 0.5-2.5 (3) MG/3ML SOLN, Inhale into the lungs., Disp: , Rfl:    LAGEVRIO 200 MG CAPS capsule, TAKE FOUR CAPSULES BY MOUTH  TWO TIMES A DAY FOR FIVE DAYS (Patient not taking: Reported on 01/10/2023), Disp: , Rfl:    levothyroxine (SYNTHROID) 100 MCG tablet, Take by mouth., Disp: , Rfl:    levothyroxine (SYNTHROID) 125 MCG tablet, Take by mouth., Disp: , Rfl:    levothyroxine (SYNTHROID, LEVOTHROID) 175 MCG tablet, Take 175 mcg by mouth daily before breakfast., Disp: , Rfl:    losartan (COZAAR) 25 MG tablet, Take 1 tablet by mouth at bedtime., Disp: , Rfl:    magnesium oxide (MAG-OX) 400 (240 Mg) MG tablet, , Disp: , Rfl:    magnesium oxide (MAG-OX) 400 MG tablet, Take by mouth., Disp: , Rfl:    metoprolol tartrate (LOPRESSOR) 25 MG tablet, , Disp: , Rfl:    pantoprazole (PROTONIX) 40 MG tablet, Take 40  mg by mouth daily., Disp: , Rfl:    predniSONE (DELTASONE) 10 MG tablet, , Disp: , Rfl:    propranolol (INDERAL) 40 MG tablet, , Disp: , Rfl:    sulfamethoxazole-trimethoprim (BACTRIM) 400-80 MG tablet, TAKE 1 TABLET BY MOUTH DAILY FOR 21 DAYS (Patient not taking: Reported on 01/10/2023), Disp: , Rfl:    TRESIBA FLEXTOUCH 200 UNIT/ML SOPN, Inject 26 Units into the skin every evening., Disp: , Rfl:    verapamil (CALAN-SR) 120 MG CR tablet, Take by mouth., Disp: , Rfl:    vitamin B-12 (CYANOCOBALAMIN) 1000 MCG tablet, Take 1,000 mcg by mouth daily. Reported on 07/07/2015, Disp: , Rfl:   Past Medical History: Past Medical History:  Diagnosis Date   Anemia    Anxiety    Colon cancer (HCC)    Diabetes mellitus without complication (HCC)    Family history of adverse reaction to anesthesia    daughter had reaction to anesthesia and was in icu   GERD (gastroesophageal reflux disease)    Hypertension    Hypothyroidism    Kidney cancer, primary, with metastasis from kidney to other site [facility]) 08/26/2014   right   Pancreas cancer (HCC) 2018   Thyroid cancer (HCC)     Tobacco Use: Social History   Tobacco Use  Smoking Status Former   Current packs/day: 0.00   Types: Cigarettes   Quit date: 07/29/1994   Years since quitting: 28.5  Smokeless Tobacco Never    Labs: Review Flowsheet       Latest Ref Rng & Units 07/29/2014 03/19/2019  Labs for ITP Cardiac and Pulmonary Rehab  Trlycerides <150 mg/dL - 161   Hemoglobin W9U 4.8 - 5.6 % 7.3  7.4      Pulmonary Assessment Scores:  Pulmonary Assessment Scores     Row Name 01/14/23 1516         ADL UCSD   ADL Phase Entry     SOB Score total 81     Rest 1     Walk 3     Stairs 5     Bath 2     Dress 3     Shop 3       CAT Score   CAT Score 23       mMRC Score   mMRC Score 2              UCSD: Self-administered rating of dyspnea associated with activities of daily living (ADLs) 6-point scale (0 = "not at all" to 5 =  "maximal or unable to do because of breathlessness")  Scoring Scores range from 0 to 120.  Minimally important difference is 5 units  CAT: CAT can identify the health  impairment of COPD patients and is better correlated with disease progression.  CAT has a scoring range of zero to 40. The CAT score is classified into four groups of low (less than 10), medium (10 - 20), high (21-30) and very high (31-40) based on the impact level of disease on health status. A CAT score over 10 suggests significant symptoms.  A worsening CAT score could be explained by an exacerbation, poor medication adherence, poor inhaler technique, or progression of COPD or comorbid conditions.  CAT MCID is 2 points  mMRC: mMRC (Modified Medical Research Council) Dyspnea Scale is used to assess the degree of baseline functional disability in patients of respiratory disease due to dyspnea. No minimal important difference is established. A decrease in score of 1 point or greater is considered a positive change.   Pulmonary Function Assessment:  Pulmonary Function Assessment - 01/10/23 0929       Breath   Shortness of Breath Yes;Limiting activity             Exercise Target Goals: Exercise Program Goal: Individual exercise prescription set using results from initial 6 min walk test and THRR while considering  patient's activity barriers and safety.   Exercise Prescription Goal: Initial exercise prescription builds to 30-45 minutes a day of aerobic activity, 2-3 days per week.  Home exercise guidelines will be given to patient during program as part of exercise prescription that the participant will acknowledge.  Education: Aerobic Exercise: - Group verbal and visual presentation on the components of exercise prescription. Introduces F.I.T.T principle from ACSM for exercise prescriptions.  Reviews F.I.T.T. principles of aerobic exercise including progression. Written material given at graduation.   Education:  Resistance Exercise: - Group verbal and visual presentation on the components of exercise prescription. Introduces F.I.T.T principle from ACSM for exercise prescriptions  Reviews F.I.T.T. principles of resistance exercise including progression. Written material given at graduation.    Education: Exercise & Equipment Safety: - Individual verbal instruction and demonstration of equipment use and safety with use of the equipment. Flowsheet Row Pulmonary Rehab from 01/14/2023 in [facility] Cardiac and Pulmonary Rehab  Date 01/10/23  Educator [facility]  Instruction Review Code 1- Verbalizes Understanding       Education: Exercise Physiology & General Exercise Guidelines: - Group verbal and written instruction with models to review the exercise physiology of the cardiovascular system and associated critical values. Provides general exercise guidelines with specific guidelines to those with heart or lung disease.    Education: Flexibility, Balance, Mind/Body Relaxation: - Group verbal and visual presentation with interactive activity on the components of exercise prescription. Introduces F.I.T.T principle from ACSM for exercise prescriptions. Reviews F.I.T.T. principles of flexibility and balance exercise training including progression. Also discusses the mind body connection.  Reviews various relaxation techniques to help reduce and manage stress (i.e. Deep breathing, progressive muscle relaxation, and visualization). Balance handout provided to take home. Written material given at graduation.   Activity Barriers & Risk Stratification:  Activity Barriers & Cardiac Risk Stratification - 01/14/23 1526       Activity Barriers & Cardiac Risk Stratification   Activity Barriers Left Knee Replacement;Right Knee Replacement;Balance Concerns;History of Falls;Shortness of Breath             6 Minute Walk:  6 Minute Walk     Row Name 01/14/23 1520         6 Minute Walk   Phase Initial     Distance 560  feet     Walk Time 4.08  minutes     # of Rest Breaks 1     MPH 1.56     METS 1.23     RPE 13     Perceived Dyspnea  2     VO2 Peak 4.31     Symptoms Yes (comment)     Comments SOB     Resting HR 96 bpm     Resting BP 122/62     Resting Oxygen Saturation  98 %     Exercise Oxygen Saturation  during 6 min walk 91 %     Max Ex. HR 137 bpm     Max Ex. BP 148/66     2 Minute Post BP 136/62       Interval HR   1 Minute HR 114     2 Minute HR 127     3 Minute HR 137     4 Minute HR 117     5 Minute HR 109     6 Minute HR 113     2 Minute Post HR 102     Interval Heart Rate? Yes       Interval Oxygen   Interval Oxygen? Yes     Baseline Oxygen Saturation % 98 %     1 Minute Oxygen Saturation % 91 %     1 Minute Liters of Oxygen 2 L  Pulsed     2 Minute Oxygen Saturation % 91 %     2 Minute Liters of Oxygen 2 L     3 Minute Oxygen Saturation % 92 %     3 Minute Liters of Oxygen 2 L     4 Minute Oxygen Saturation % 92 %     4 Minute Liters of Oxygen 2 L     5 Minute Oxygen Saturation % 96 %     5 Minute Liters of Oxygen 2 L     6 Minute Oxygen Saturation % 93 %     6 Minute Liters of Oxygen 2 L     2 Minute Post Oxygen Saturation % 98 %     2 Minute Post Liters of Oxygen 2 L             Oxygen Initial Assessment:  Oxygen Initial Assessment - 01/10/23 0927       Home Oxygen   Home Oxygen Device Home Concentrator;Portable Concentrator    Sleep Oxygen Prescription Continuous    Liters per minute 2    Home Exercise Oxygen Prescription Continuous    Liters per minute 2    Home Resting Oxygen Prescription Continuous    Liters per minute 2    Compliance with Home Oxygen Use Yes      Initial 6 min Walk   Oxygen Used Continuous;Portable Concentrator    Liters per minute 2      Program Oxygen Prescription   Program Oxygen Prescription Continuous    Liters per minute 2      Intervention   Short Term Goals To learn and exhibit compliance with exercise, home and  travel O2 prescription;To learn and understand importance of monitoring SPO2 with pulse oximeter and demonstrate accurate use of the pulse oximeter.;To learn and understand importance of maintaining oxygen saturations>88%;To learn and demonstrate proper pursed lip breathing techniques or other breathing techniques. ;To learn and demonstrate proper use of respiratory medications    Long  Term Goals Exhibits compliance with exercise, home  and travel O2 prescription;Verbalizes importance of monitoring SPO2 with  pulse oximeter and return demonstration;Maintenance of O2 saturations>88%;Exhibits proper breathing techniques, such as pursed lip breathing or other method taught during program session;Compliance with respiratory medication;Demonstrates proper use of MDI's             Oxygen Re-Evaluation:  Oxygen Re-Evaluation     Row Name 01/17/23 1409             Goals/Expected Outcomes   Comments Reviewed PLB technique with pt.  Talked about how it works and it's importance in maintaining their exercise saturations.       Goals/Expected Outcomes Short: Become more profiecient at using PLB. Long: Become independent at using PLB.                Oxygen Discharge (Final Oxygen Re-Evaluation):  Oxygen Re-Evaluation - 01/17/23 1409       Goals/Expected Outcomes   Comments Reviewed PLB technique with pt.  Talked about how it works and it's importance in maintaining their exercise saturations.    Goals/Expected Outcomes Short: Become more profiecient at using PLB. Long: Become independent at using PLB.             Initial Exercise Prescription:  Initial Exercise Prescription - 01/14/23 1500       Date of Initial Exercise RX and Referring Provider   Date 01/14/23    Referring Provider Dr. Vida Rigger, MD      Oxygen   Oxygen Continuous    Liters 2    Maintain Oxygen Saturation 88% or higher      Recumbant Bike   Level 1    RPM 50    Watts 15    Minutes 15    METs 1.23       NuStep   Level 1    SPM 80    Minutes 15    METs 1.23      Track   Laps 10    Minutes 15    METs 1.54      Prescription Details   Frequency (times per week) 2    Duration Progress to 30 minutes of continuous aerobic without signs/symptoms of physical distress      Intensity   THRR 40-80% of Max Heartrate 114-132    Ratings of Perceived Exertion 11-13    Perceived Dyspnea 0-4      Progression   Progression Continue to progress workloads to maintain intensity without signs/symptoms of physical distress.      Resistance Training   Training Prescription Yes    Weight 4 lb    Reps 10-15             Perform Capillary Blood Glucose checks as needed.  Exercise Prescription Changes:   Exercise Prescription Changes     Row Name 01/14/23 1500 01/29/23 1400 02/12/23 1400         Response to Exercise   Blood Pressure (Admit) 122/62 136/68 120/56     Blood Pressure (Exercise) 148/66 158/70 154/60     Blood Pressure (Exit) 136/62 122/60 136/60     Heart Rate (Admit) 96 bpm 91 bpm 87 bpm     Heart Rate (Exercise) 137 bpm 103 bpm 106 bpm     Heart Rate (Exit) 102 bpm 90 bpm 94 bpm     Oxygen Saturation (Admit) 98 % 96 % 96 %     Oxygen Saturation (Exercise) 91 % 92 % 91 %     Oxygen Saturation (Exit) 98 % 92 % 91 %     Rating  of Perceived Exertion (Exercise) 13 13 12      Perceived Dyspnea (Exercise) 2 0 0     Symptoms SOB SOB SOB     Comments Results First two days of exercise --     Duration -- Progress to 30 minutes of  aerobic without signs/symptoms of physical distress Continue with 30 min of aerobic exercise without signs/symptoms of physical distress.     Intensity -- THRR unchanged THRR unchanged       Progression   Progression -- Continue to progress workloads to maintain intensity without signs/symptoms of physical distress. Continue to progress workloads to maintain intensity without signs/symptoms of physical distress.     Average METs -- 2.35 2.13        Resistance Training   Training Prescription -- Yes Yes     Weight -- 4 lb 4 lb     Reps -- 10-15 10-15       Interval Training   Interval Training -- No No       Oxygen   Oxygen -- Continuous Continuous     Liters -- 2 0-2       Recumbant Bike   Level -- 2 1     Watts -- 18 18     Minutes -- 15 15     METs -- 2.6 2.6       NuStep   Level -- 1 3     Minutes -- 15 30     METs -- 2.3 2.1       Oxygen   Maintain Oxygen Saturation -- 88% or higher 88% or higher              Exercise Comments:   Exercise Comments     Row Name 01/17/23 1408           Exercise Comments First full day of exercise!  Patient was oriented to gym and equipment including functions, settings, policies, and procedures.  Patient's individual exercise prescription and treatment plan were reviewed.  All starting workloads were established based on the results of the 6 minute walk test done at initial orientation visit.  The plan for exercise progression was also introduced and progression will be customized based on patient's performance and goals.                Exercise Goals and Review:   Exercise Goals     Row Name 01/14/23 1526             Exercise Goals   Increase Physical Activity Yes       Intervention Provide advice, education, support and counseling about physical activity/exercise needs.;Develop an individualized exercise prescription for aerobic and resistive training based on initial evaluation findings, risk stratification, comorbidities and participant's personal goals.       Expected Outcomes Short Term: Attend rehab on a regular basis to increase amount of physical activity.;Long Term: Exercising regularly at least 3-5 days a week.;Long Term: Add in home exercise to make exercise part of routine and to increase amount of physical activity.       Increase Strength and Stamina Yes       Intervention Develop an individualized exercise prescription for aerobic and  resistive training based on initial evaluation findings, risk stratification, comorbidities and participant's personal goals.;Provide advice, education, support and counseling about physical activity/exercise needs.       Expected Outcomes Short Term: Increase workloads from initial exercise prescription for resistance, speed, and METs.;Short Term: Perform resistance training  exercises routinely during rehab and add in resistance training at home;Long Term: Improve cardiorespiratory fitness, muscular endurance and strength as measured by increased METs and functional capacity ( )       Able to understand and use rate of perceived exertion (RPE) scale Yes       Intervention Provide education and explanation on how to use RPE scale       Expected Outcomes Short Term: Able to use RPE daily in rehab to express subjective intensity level;Long Term:  Able to use RPE to guide intensity level when exercising independently       Able to understand and use Dyspnea scale Yes       Intervention Provide education and explanation on how to use Dyspnea scale       Expected Outcomes Short Term: Able to use Dyspnea scale daily in rehab to express subjective sense of shortness of breath during exertion;Long Term: Able to use Dyspnea scale to guide intensity level when exercising independently       Knowledge and understanding of Target Heart Rate Range (THRR) Yes       Intervention Provide education and explanation of THRR including how the numbers were predicted and where they are located for reference       Expected Outcomes Short Term: Able to state/look up THRR;Long Term: Able to use THRR to govern intensity when exercising independently;Short Term: Able to use daily as guideline for intensity in rehab       Able to check pulse independently Yes       Intervention Provide education and demonstration on how to check pulse in carotid and radial arteries.;Review the importance of being able to check your own pulse for  safety during independent exercise       Expected Outcomes Short Term: Able to explain why pulse checking is important during independent exercise;Long Term: Able to check pulse independently and accurately       Understanding of Exercise Prescription Yes       Intervention Provide education, explanation, and written materials on patient's individual exercise prescription       Expected Outcomes Short Term: Able to explain program exercise prescription;Long Term: Able to explain home exercise prescription to exercise independently                Exercise Goals Re-Evaluation :  Exercise Goals Re-Evaluation     Row Name 01/17/23 1408 01/29/23 1454 02/12/23 1444         Exercise Goal Re-Evaluation   Exercise Goals Review Able to understand and use rate of perceived exertion (RPE) scale;Able to understand and use Dyspnea scale;Knowledge and understanding of Target Heart Rate Range (THRR);Understanding of Exercise Prescription Increase Physical Activity;Increase Strength and Stamina;Understanding of Exercise Prescription Increase Physical Activity;Increase Strength and Stamina;Understanding of Exercise Prescription     Comments Reviewed RPE and dyspnea scale, THR and program prescription with pt today.  Pt voiced understanding and was given a copy of goals to take home. Dondra Spry is off to a good start in rehab. She has done well on the T4 nustep at level 1 and improved to level 2 on the recumbent bike. She also has done well with 4 lb hand weights for resistance training. She has maintained her O2 saturations above 92% during exercise as well. We will continue to monitor her progress in the program. Dondra Spry is doing well in the program. She continues to do well on the T4 nustep and improved to level 3. She also has done well  on the recumbent bike at level 1 and continues to use 4 lb hand weights for resistance training. She has not done any walking in the program up to this point. We will continue to  monitor her progress in the program.     Expected Outcomes Short: Use RPE daily to regulate intensity. Long: Follow program prescription in THR. Short: Continue to follow current exercise prescription. Long: Continue exercise to improve strength and stamina. Short: Begin walking the track in rehab. Long: Continue exercise to improve strength and stamina.              Discharge Exercise Prescription (Final Exercise Prescription Changes):  Exercise Prescription Changes - 02/12/23 1400       Response to Exercise   Blood Pressure (Admit) 120/56    Blood Pressure (Exercise) 154/60    Blood Pressure (Exit) 136/60    Heart Rate (Admit) 87 bpm    Heart Rate (Exercise) 106 bpm    Heart Rate (Exit) 94 bpm    Oxygen Saturation (Admit) 96 %    Oxygen Saturation (Exercise) 91 %    Oxygen Saturation (Exit) 91 %    Rating of Perceived Exertion (Exercise) 12    Perceived Dyspnea (Exercise) 0    Symptoms SOB    Duration Continue with 30 min of aerobic exercise without signs/symptoms of physical distress.    Intensity THRR unchanged      Progression   Progression Continue to progress workloads to maintain intensity without signs/symptoms of physical distress.    Average METs 2.13      Resistance Training   Training Prescription Yes    Weight 4 lb    Reps 10-15      Interval Training   Interval Training No      Oxygen   Oxygen Continuous    Liters 0-2      Recumbant Bike   Level 1    Watts 18    Minutes 15    METs 2.6      NuStep   Level 3    Minutes 30    METs 2.1      Oxygen   Maintain Oxygen Saturation 88% or higher             Nutrition:  Target Goals: Understanding of nutrition guidelines, daily intake of sodium 1500mg , cholesterol 200mg , calories 30% from fat and 7% or less from saturated fats, daily to have 5 or more servings of fruits and vegetables.  Education: All About Nutrition: -Group instruction provided by verbal, written material, interactive  activities, discussions, models, and posters to present general guidelines for heart healthy nutrition including fat, fiber, MyPlate, the role of sodium in heart healthy nutrition, utilization of the nutrition label, and utilization of this knowledge for meal planning. Follow up email sent as well. Written material given at graduation.   Biometrics:  Pre Biometrics - 01/14/23 1527       Pre Biometrics   Height 5' 5.5" (1.664 m)    Weight 211 lb (95.7 kg)    Waist Circumference 43.5 inches    Hip Circumference 50 inches    Waist to Hip Ratio 0.87 %    BMI (Calculated) 34.57    Single Leg Stand 2.85 seconds              Nutrition Therapy Plan and Nutrition Goals:  Nutrition Therapy & Goals - 01/14/23 1514       Nutrition Therapy   RD appointment deferred Yes  Intervention Plan   Intervention Prescribe, educate and counsel regarding individualized specific dietary modifications aiming towards targeted core components such as weight, hypertension, lipid management, diabetes, heart failure and other comorbidities.    Expected Outcomes Short Term Goal: Understand basic principles of dietary content, such as calories, fat, sodium, cholesterol and nutrients.;Short Term Goal: A plan has been developed with personal nutrition goals set during dietitian appointment.;Long Term Goal: Adherence to prescribed nutrition plan.             Nutrition Assessments:  MEDIFICTS Score Key: >=70 Need to make dietary changes  40-70 Heart Healthy Diet <= 40 Therapeutic Level Cholesterol Diet  Flowsheet Row Pulmonary Rehab from 01/14/2023 in [facility] Cardiac and Pulmonary Rehab  Picture Your Plate Total Score on Admission 46      Picture Your Plate Scores: <02 Unhealthy dietary pattern with much room for improvement. 41-50 Dietary pattern unlikely to meet recommendations for good health and room for improvement. 51-60 More healthful dietary pattern, with some room for improvement.  >60  Healthy dietary pattern, although there may be some specific behaviors that could be improved.   Nutrition Goals Re-Evaluation:  Nutrition Goals Re-Evaluation     Row Name 02/12/23 0931             Goals   Current Weight 208 lb 11.2 oz (94.7 kg)       Nutrition Goal RD appointment deferred                Nutrition Goals Discharge (Final Nutrition Goals Re-Evaluation):  Nutrition Goals Re-Evaluation - 02/12/23 0931       Goals   Current Weight 208 lb 11.2 oz (94.7 kg)    Nutrition Goal RD appointment deferred             Psychosocial: Target Goals: Acknowledge presence or absence of significant depression and/or stress, maximize coping skills, provide positive support system. Participant is able to verbalize types and ability to use techniques and skills needed for reducing stress and depression.   Education: Stress, Anxiety, and Depression - Group verbal and visual presentation to define topics covered.  Reviews how body is impacted by stress, anxiety, and depression.  Also discusses healthy ways to reduce stress and to treat/manage anxiety and depression.  Written material given at graduation.   Education: Sleep Hygiene -Provides group verbal and written instruction about how sleep can affect your health.  Define sleep hygiene, discuss sleep cycles and impact of sleep habits. Review good sleep hygiene tips.    Initial Review & Psychosocial Screening:  Initial Psych Review & Screening - 01/10/23 0931       Initial Review   Current issues with Current Psychotropic Meds;Current Depression;History of Depression      Family Dynamics   Good Support System? Yes    Comments Dondra Spry has a lot of friends, chruch and family that support her. She does take medication for depression. She is ready to get started in the program since her last bout with Covid she has been more short of breath.      Barriers   Psychosocial barriers to participate in program The patient should  benefit from training in stress management and relaxation.      Screening Interventions   Interventions Encouraged to exercise;To provide support and resources with identified psychosocial needs;Provide feedback about the scores to participant    Expected Outcomes Short Term goal: Utilizing psychosocial counselor, staff and physician to assist with identification of specific Stressors or current issues interfering  with healing process. Setting desired goal for each stressor or current issue identified.;Long Term Goal: Stressors or current issues are controlled or eliminated.;Short Term goal: Identification and review with participant of any Quality of Life or Depression concerns found by scoring the questionnaire.;Long Term goal: The participant improves quality of Life and PHQ9 Scores as seen by post scores and/or verbalization of changes             Quality of Life Scores:  Scores of 19 and below usually indicate a poorer quality of life in these areas.  A difference of  2-3 points is a clinically meaningful difference.  A difference of 2-3 points in the total score of the Quality of Life Index has been associated with significant improvement in overall quality of life, self-image, physical symptoms, and general health in studies assessing change in quality of life.  PHQ-9: Review Flowsheet       01/14/2023  Depression screen PHQ 2/9  Decreased Interest 0  Down, Depressed, Hopeless 0  PHQ - 2 Score 0  Altered sleeping 2  Tired, decreased energy 0  Change in appetite 0  Feeling bad or failure about yourself  0  Trouble concentrating 1  Moving slowly or fidgety/restless 0  Suicidal thoughts 1  PHQ-9 Score 4  Difficult doing work/chores Somewhat difficult   Interpretation of Total Score  Total Score Depression Severity:  1-4 = Minimal depression, 5-9 = Mild depression, 10-14 = Moderate depression, 15-19 = Moderately severe depression, 20-27 = Severe depression   Psychosocial  Evaluation and Intervention:  Psychosocial Evaluation - 01/10/23 0933       Psychosocial Evaluation & Interventions   Interventions Encouraged to exercise with the program and follow exercise prescription;Stress management education;Relaxation education    Comments Dondra Spry has a lot of friends, chruch and family that support her. She does take medication for depression. She is ready to get started in the program since her last bout with Covid she has been more short of breath.    Expected Outcomes Short: Start [facility] to help with mood. Long: Maintain a healthy mental state.    Continue Psychosocial Services  Follow up required by staff             Psychosocial Re-Evaluation:  Psychosocial Re-Evaluation     Row Name 02/12/23 [phone]             Psychosocial Re-Evaluation   Current issues with None Identified       Comments Dondra Spry states that she has no current stressors, and states that she is getting good sleep. Her grandson, son and daughter make up her support system, and she can reach out to them when she is dealing with stress.       Expected Outcomes Short: Continue to attend [facility] regularly for regular exercise and social engagement. Long: Continue to improve symptoms and manage a positive mental state.       Interventions Encouraged to attend Pulmonary Rehabilitation for the exercise       Continue Psychosocial Services  Follow up required by staff                Psychosocial Discharge (Final Psychosocial Re-Evaluation):  Psychosocial Re-Evaluation - 02/12/23 0926       Psychosocial Re-Evaluation   Current issues with None Identified    Comments Dondra Spry states that she has no current stressors, and states that she is getting good sleep. Her grandson, son and daughter make up her support system, and she can reach  out to them when she is dealing with stress.    Expected Outcomes Short: Continue to attend [facility] regularly for regular exercise and social engagement.  Long: Continue to improve symptoms and manage a positive mental state.    Interventions Encouraged to attend Pulmonary Rehabilitation for the exercise    Continue Psychosocial Services  Follow up required by staff             Education: Education Goals: Education classes will be provided on a weekly basis, covering required topics. Participant will state understanding/return demonstration of topics presented.  Learning Barriers/Preferences:  Learning Barriers/Preferences - 01/10/23 0930       Learning Barriers/Preferences   Learning Barriers Sight    Learning Preferences None             General Pulmonary Education Topics:  Infection Prevention: - Provides verbal and written material to individual with discussion of infection control including proper hand washing and proper equipment cleaning during exercise session. Flowsheet Row Pulmonary Rehab from 01/14/2023 in [facility] Cardiac and Pulmonary Rehab  Date 01/10/23  Educator [facility]  Instruction Review Code 1- Verbalizes Understanding       Falls Prevention: - Provides verbal and written material to individual with discussion of falls prevention and safety. Flowsheet Row Pulmonary Rehab from 01/14/2023 in [facility] Cardiac and Pulmonary Rehab  Date 01/10/23  Educator [facility]  Instruction Review Code 1- Verbalizes Understanding       Chronic Lung Disease Review: - Group verbal instruction with posters, models, PowerPoint presentations and videos,  to review new updates, new respiratory medications, new advancements in procedures and treatments. Providing information on websites and "800" numbers for continued self-education. Includes information about supplement oxygen, available portable oxygen systems, continuous and intermittent flow rates, oxygen safety, concentrators, and Medicare reimbursement for oxygen. Explanation of Pulmonary Drugs, including class, frequency, complications, importance of spacers, rinsing mouth after steroid  MDI's, and proper cleaning methods for nebulizers. Review of basic lung anatomy and physiology related to function, structure, and complications of lung disease. Review of risk factors. Discussion about methods for diagnosing sleep apnea and types of masks and machines for OSA. Includes a review of the use of types of environmental controls: home humidity, furnaces, filters, dust mite/pet prevention, HEPA vacuums. Discussion about weather changes, air quality and the benefits of nasal washing. Instruction on Warning signs, infection symptoms, calling MD promptly, preventive modes, and value of vaccinations. Review of effective airway clearance, coughing and/or vibration techniques. Emphasizing that all should Create an Action Plan. Written material given at graduation. Flowsheet Row Pulmonary Rehab from 01/14/2023 in [facility] Cardiac and Pulmonary Rehab  Education need identified 01/14/23       AED/CPR: - Group verbal and written instruction with the use of models to demonstrate the basic use of the AED with the basic ABC's of resuscitation.    Anatomy and Cardiac Procedures: - Group verbal and visual presentation and models provide information about basic cardiac anatomy and function. Reviews the testing methods done to diagnose heart disease and the outcomes of the test results. Describes the treatment choices: Medical Management, Angioplasty, or Coronary Bypass Surgery for treating various heart conditions including Myocardial Infarction, Angina, Valve Disease, and Cardiac Arrhythmias.  Written material given at graduation.   Medication Safety: - Group verbal and visual instruction to review commonly prescribed medications for heart and lung disease. Reviews the medication, class of the drug, and side effects. Includes the steps to properly store meds and maintain the prescription regimen.  Written material given  at graduation.   Other: -Provides group and verbal instruction on various topics  (see comments)   Knowledge Questionnaire Score:  Knowledge Questionnaire Score - 01/14/23 1516       Knowledge Questionnaire Score   Pre Score 15/18              Core Components/Risk Factors/Patient Goals at Admission:  Personal Goals and Risk Factors at Admission - 01/10/23 0930       Core Components/Risk Factors/Patient Goals on Admission    Weight Management Yes;Weight Maintenance    Intervention Weight Management: Develop a combined nutrition and exercise program designed to reach desired caloric intake, while maintaining appropriate intake of nutrient and fiber, sodium and fats, and appropriate energy expenditure required for the weight goal.;Weight Management: Provide education and appropriate resources to help participant work on and attain dietary goals.;Weight Management/Obesity: Establish reasonable short term and long term weight goals.    Expected Outcomes Short Term: Continue to assess and modify interventions until short term weight is achieved;Long Term: Adherence to nutrition and physical activity/exercise program aimed toward attainment of established weight goal;Weight Maintenance: Understanding of the daily nutrition guidelines, which includes 25-35% calories from fat, 7% or less cal from saturated fats, less than 200mg  cholesterol, less than 1.5gm of sodium, & 5 or more servings of fruits and vegetables daily;Understanding recommendations for meals to include 15-35% energy as protein, 25-35% energy from fat, 35-60% energy from carbohydrates, less than 200mg  of dietary cholesterol, 20-35 gm of total fiber daily;Understanding of distribution of calorie intake throughout the day with the consumption of 4-5 meals/snacks    Improve shortness of breath with ADL's Yes    Intervention Provide education, individualized exercise plan and daily activity instruction to help decrease symptoms of SOB with activities of daily living.    Expected Outcomes Short Term: Improve  cardiorespiratory fitness to achieve a reduction of symptoms when performing ADLs;Long Term: Be able to perform more ADLs without symptoms or delay the onset of symptoms    Diabetes Yes    Intervention Provide education about signs/symptoms and action to take for hypo/hyperglycemia.;Provide education about proper nutrition, including hydration, and aerobic/resistive exercise prescription along with prescribed medications to achieve blood glucose in normal ranges: Fasting glucose 65-99 mg/dL    Expected Outcomes Short Term: Participant verbalizes understanding of the signs/symptoms and immediate care of hyper/hypoglycemia, proper foot care and importance of medication, aerobic/resistive exercise and nutrition plan for blood glucose control.;Long Term: Attainment of HbA1C < 7%.    Hypertension Yes    Intervention Provide education on lifestyle modifcations including regular physical activity/exercise, weight management, moderate sodium restriction and increased consumption of fresh fruit, vegetables, and low fat dairy, alcohol moderation, and smoking cessation.;Monitor prescription use compliance.    Expected Outcomes Short Term: Continued assessment and intervention until BP is < 140/29mm HG in hypertensive participants. < 130/56mm HG in hypertensive participants with diabetes, heart failure or chronic kidney disease.;Long Term: Maintenance of blood pressure at goal levels.             Education:Diabetes - Individual verbal and written instruction to review signs/symptoms of diabetes, desired ranges of glucose level fasting, after meals and with exercise. Acknowledge that pre and post exercise glucose checks will be done for 3 sessions at entry of program. Flowsheet Row Pulmonary Rehab from 01/14/2023 in [facility] Cardiac and Pulmonary Rehab  Date 01/10/23  Educator [facility]  Instruction Review Code 1- Verbalizes Understanding       Know Your Numbers and Heart Failure: - Group  verbal and visual  instruction to discuss disease risk factors for cardiac and pulmonary disease and treatment options.  Reviews associated critical values for Overweight/Obesity, Hypertension, Cholesterol, and Diabetes.  Discusses basics of heart failure: signs/symptoms and treatments.  Introduces Heart Failure Zone chart for action plan for heart failure.  Written material given at graduation. Flowsheet Row Pulmonary Rehab from 01/14/2023 in [facility] Cardiac and Pulmonary Rehab  Education need identified 01/14/23       Core Components/Risk Factors/Patient Goals Review:   Goals and Risk Factor Review     Row Name 02/12/23 0932             Core Components/Risk Factors/Patient Goals Review   Personal Goals Review Hypertension       Review Dondra Spry is taking her blood pressure every other day. She was taking it every day twice a day when she was prescribed new medication but has since gotten a better grasp on her blood pressure medication.       Expected Outcomes Short: Continue to take blood pressure regularly. Long: Continue to monitor blood pressure to deal with hypertension.                Core Components/Risk Factors/Patient Goals at Discharge (Final Review):   Goals and Risk Factor Review - 02/12/23 0932       Core Components/Risk Factors/Patient Goals Review   Personal Goals Review Hypertension    Review Dondra Spry is taking her blood pressure every other day. She was taking it every day twice a day when she was prescribed new medication but has since gotten a better grasp on her blood pressure medication.    Expected Outcomes Short: Continue to take blood pressure regularly. Long: Continue to monitor blood pressure to deal with hypertension.             ITP Comments:  ITP Comments     Row Name 01/10/23 0926 01/14/23 1510 01/16/23 0948 01/17/23 1401 02/13/23 1151   ITP Comments Virtual Visit completed. Patient informed on EP and RD appointment and 6 Minute walk test. Patient also informed of  patient health questionnaires on My Chart. Patient Verbalizes understanding. Visit diagnosis can be found in [facility] 01/12/2023. Completed and gym orientation. Initial ITP created and sent for review to Dr. Jinny Sanders, Medical Director. 30 Day review completed. Medical Director ITP review done, changes made as directed, and signed approval by Medical Director.    new to program First full day of exercise!  Patient was oriented to gym and equipment including functions, settings, policies, and procedures.  Patient's individual exercise prescription and treatment plan were reviewed.  All starting workloads were established based on the results of the 6 minute walk test done at initial orientation visit.  The plan for exercise progression was also introduced and progression will be customized based on patient's performance and goals. 30 Day review completed. Medical Director ITP review done, changes made as directed, and signed approval by Medical Director.    new to program            Comments:

## 2023-02-14 ENCOUNTER — Encounter: Payer: 59 | Admitting: *Deleted

## 2023-02-14 DIAGNOSIS — R0609 Other forms of dyspnea: Secondary | ICD-10-CM

## 2023-02-14 NOTE — Progress Notes (Signed)
Daily Session Note  Patient Details  Name: Rebecca Kirby MRN: [id number] Date of Birth: Feb 23, 1946 Referring Provider:   Doristine Devoid Pulmonary Rehab from 01/14/2023 in [facility] Cardiac and Pulmonary Rehab  Referring Provider Dr. Vida Rigger, MD       Encounter Date: 02/14/2023  Check In:  Session Check In - 02/14/23 1440       Check-In   Supervising physician immediately available to respond to emergencies See telemetry face sheet for immediately available ER MD    Location [facility]-Cardiac & Pulmonary Rehab    Staff Present Elige Ko, RCP,RRT,BSRT;Cora Collum, RN, BSN, CCRP;[employer]on [employer], , Exercise Physiologist;Keyosha Tiedt Katrinka Blazing, RN, [place]    Virtual Visit No    Medication changes reported     No    Fall or balance concerns reported    No    Warm-up and Cool-down Performed on first and last piece of equipment    Resistance Training Performed Yes    VAD Patient? No    PAD/SET Patient? No      Pain Assessment   Currently in Pain? No/denies                Social History   Tobacco Use  Smoking Status Former   Current packs/day: 0.00   Types: Cigarettes   Quit date: 07/29/1994   Years since quitting: 28.5  Smokeless Tobacco Never    Goals Met:  Independence with exercise equipment Exercise tolerated well No report of concerns or symptoms today Strength training completed today  Goals Unmet:  Not Applicable  Comments: Pt able to follow exercise prescription today without complaint.  Will continue to monitor for progression.    Dr. Bethann Punches is Medical Director for [facility] Cardiac Rehabilitation.  Dr. Vida Rigger is Medical Director for [facility] Pulmonary Rehabilitation.

## 2023-02-19 ENCOUNTER — Encounter: Payer: 59 | Admitting: *Deleted

## 2023-02-19 DIAGNOSIS — R0609 Other forms of dyspnea: Secondary | ICD-10-CM | POA: Diagnosis not present

## 2023-02-19 NOTE — Progress Notes (Signed)
Daily Session Note  Patient Details  Name: ANGILEE WOOLDRIDGE MRN: [id number] Date of Birth: September 26, 1945 Referring Provider:   Flowsheet Row Pulmonary Rehab from 01/14/2023 in [facility] Cardiac and Pulmonary Rehab  Referring Provider Dr. Vida Rigger, MD       Encounter Date: 02/19/2023  Check In:  Session Check In - 02/19/23 0932       Check-In   Supervising physician immediately available to respond to emergencies See telemetry face sheet for immediately available ER MD    Location [facility]-Cardiac & Pulmonary Rehab    Staff Present Cora Collum, RN, BSN, CCRP;Margaret Best, MS, Exercise Physiologist;Kristen Coble, RN,BC,MSN;Jason Wallace Cullens, RDN, Luiz Iron, BS, Exercise Physiologist    Virtual Visit No    Medication changes reported     No    Fall or balance concerns reported    No    Warm-up and Cool-down Performed on first and last piece of equipment    Resistance Training Performed Yes    VAD Patient? No    PAD/SET Patient? No      Pain Assessment   Currently in Pain? No/denies                Social History   Tobacco Use  Smoking Status Former   Current packs/day: 0.00   Types: Cigarettes   Quit date: 07/29/1994   Years since quitting: 28.5  Smokeless Tobacco Never    Goals Met:  Proper associated with RPD/PD & O2 Sat Independence with exercise equipment Exercise tolerated well No report of concerns or symptoms today  Goals Unmet:  Not Applicable  Comments: Pt able to follow exercise prescription today without complaint.  Will continue to monitor for progression.    Dr. Bethann Punches is Medical Director for [facility] Cardiac Rehabilitation.  Dr. Vida Rigger is Medical Director for  [facility] Pulmonary Rehabilitation.

## 2023-02-21 ENCOUNTER — Encounter: Payer: 59 | Admitting: *Deleted

## 2023-02-21 DIAGNOSIS — R0609 Other forms of dyspnea: Secondary | ICD-10-CM | POA: Diagnosis not present

## 2023-02-21 NOTE — Progress Notes (Signed)
Daily Session Note  Patient Details  Name: Rebecca Kirby MRN: [id number] Date of Birth: 1945-03-05 Referring Provider:   Flowsheet Row Pulmonary Rehab from 01/14/2023 in [facility] Cardiac and Pulmonary Rehab  Referring Provider Dr. Vida Rigger, MD       Encounter Date: 02/21/2023  Check In:  Session Check In - 02/21/23 1416       Check-In   Supervising physician immediately available to respond to emergencies See telemetry face sheet for immediately available ER MD    Location [facility]-Cardiac & Pulmonary Rehab    Staff Present Ronette Deter, BS, Exercise Physiologist;Joseph Zebulon, RCP,RRT,BSRT;Maxon Wheatley BS, , Exercise Physiologist;Leonela Kivi Katrinka Blazing, RN, ADN    Virtual Visit No    Medication changes reported     No    Fall or balance concerns reported    No    Warm-up and Cool-down Performed on first and last piece of equipment    Resistance Training Performed Yes    VAD Patient? No    PAD/SET Patient? No      Pain Assessment   Currently in Pain? No/denies                Social History   Tobacco Use  Smoking Status Former   Current packs/day: 0.00   Types: Cigarettes   Quit date: 07/29/1994   Years since quitting: 28.5  Smokeless Tobacco Never    Goals Met:  Independence with exercise equipment Exercise tolerated well No report of concerns or symptoms today Strength training completed today  Goals Unmet:  Not Applicable  Comments: Pt able to follow exercise prescription today without complaint.  Will continue to monitor for progression.    Dr. Bethann Punches is Medical Director for [facility] Cardiac Rehabilitation.  Dr. Vida Rigger is Medical Director for [facility] Pulmonary Rehabilitation.

## 2023-02-26 ENCOUNTER — Encounter: Payer: 59 | Admitting: *Deleted

## 2023-02-26 DIAGNOSIS — R0609 Other forms of dyspnea: Secondary | ICD-10-CM | POA: Diagnosis not present

## 2023-02-26 DIAGNOSIS — U099 Post covid-19 condition, unspecified: Secondary | ICD-10-CM

## 2023-02-26 NOTE — Progress Notes (Signed)
 Daily Session Note  Patient Details  Name: Rebecca Kirby MRN: [id number] Date of Birth: 1946/01/07 Referring Provider:   Flowsheet Row Pulmonary Rehab from 01/14/2023 in [facility] Cardiac and Pulmonary Rehab  Referring Provider Dr. Halina Picking, MD       Encounter Date: 02/26/2023  Check In:  Session Check In - 02/26/23 0944       Check-In   Supervising physician immediately available to respond to emergencies See telemetry face sheet for immediately available ER MD    Location [facility]-Cardiac & Pulmonary Rehab    Staff Present Rollene Paterson, MS, Exercise Physiologist;Sheriece Jefcoat, RN, BSN, CCRP;Noah Tickle, BS, Exercise Physiologist;Maxon Conetta BS, , Exercise Physiologist    Virtual Visit No    Medication changes reported     No    Fall or balance concerns reported    No    Warm-up and Cool-down Performed on first and last piece of equipment    Resistance Training Performed Yes    VAD Patient? No    PAD/SET Patient? No      Pain Assessment   Currently in Pain? No/denies                Social History   Tobacco Use  Smoking Status Former   Current packs/day: 0.00   Types: Cigarettes   Quit date: 07/29/1994   Years since quitting: 28.6  Smokeless Tobacco Never    Goals Met:  Proper associated with RPD/PD & O2 Sat Independence with exercise equipment Exercise tolerated well No report of concerns or symptoms today  Goals Unmet:  Not Applicable  Comments: Pt able to follow exercise prescription today without complaint.  Will continue to monitor for progression.    Dr. Oneil Pinal is Medical Director for [facility] Cardiac Rehabilitation.  Dr. Fuad Aleskerov is Medical Director for [facility] Pulmonary Rehabilitation.

## 2023-02-28 ENCOUNTER — Encounter: Payer: 59 | Admitting: *Deleted

## 2023-03-07 ENCOUNTER — Encounter: Payer: 59 | Attending: Pulmonary Disease | Admitting: *Deleted

## 2023-03-07 DIAGNOSIS — R0609 Other forms of dyspnea: Secondary | ICD-10-CM | POA: Diagnosis present

## 2023-03-07 DIAGNOSIS — U099 Post covid-19 condition, unspecified: Secondary | ICD-10-CM | POA: Diagnosis present

## 2023-03-07 NOTE — Progress Notes (Signed)
 Daily Session Note  Patient Details  Name: Rebecca Kirby MRN: [id number] Date of Birth: [birth year] Referring Provider:   Flowsheet Row Pulmonary Rehab from 01/14/2023 in [facility] Cardiac and Pulmonary Rehab  Referring Provider Dr. Halina Picking, MD       Encounter Date: 03/07/2023  Check In:  Session Check In - 03/07/23 1448       Check-In   Supervising physician immediately available to respond to emergencies See telemetry face sheet for immediately available ER MD    Location [facility]-Cardiac & Pulmonary Rehab    Staff Present Devaughn Jaeger, BS, Exercise Physiologist;Meredith Tressa, RN BSN;Joseph Rolinda [place] HARMAN Arzella Claudene, RN, [place]    Virtual Visit No    Medication changes reported     No    Fall or balance concerns reported    No    Warm-up and Cool-down Performed on first and last piece of equipment    Resistance Training Performed Yes    VAD Patient? No    PAD/SET Patient? No      Pain Assessment   Currently in Pain? No/denies                Social History   Tobacco Use  Smoking Status Former   Current packs/day: 0.00   Types: Cigarettes   Quit date: 07/29/1994   Years since quitting: 28.6  Smokeless Tobacco Never    Goals Met:  Independence with exercise equipment Exercise tolerated well No report of concerns or symptoms today Strength training completed today  Goals Unmet:  Not Applicable  Comments: Pt able to follow exercise prescription today without complaint.  Will continue to monitor for progression.    Dr. Oneil Pinal is Medical Director for [facility] Cardiac Rehabilitation.  Dr. Fuad Aleskerov is Medical Director for [facility] Pulmonary Rehabilitation.

## 2023-03-12 ENCOUNTER — Encounter: Payer: 59 | Admitting: *Deleted

## 2023-03-12 DIAGNOSIS — R0609 Other forms of dyspnea: Secondary | ICD-10-CM | POA: Diagnosis not present

## 2023-03-12 NOTE — Progress Notes (Signed)
 Daily Session Note  Patient Details  Name: Rebecca Kirby MRN: [id number] Date of Birth: November 13, 1945 Referring Provider:   Flowsheet Row Pulmonary Rehab from 01/14/2023 in [facility] Cardiac and Pulmonary Rehab  Referring Provider Dr. Halina Picking, MD       Encounter Date: 03/12/2023  Check In:  Session Check In - 03/12/23 0940       Check-In   Supervising physician immediately available to respond to emergencies See telemetry face sheet for immediately available ER MD    Location [facility]-Cardiac & Pulmonary Rehab    Staff Present Rollene Paterson, MS, Exercise Physiologist;Jonice Cerra, RN, BSN, CCRP;Krista Spencer RN, BSN;Maxon Conetta BS, , Exercise Physiologist    Virtual Visit No    Medication changes reported     No    Fall or balance concerns reported    No    Warm-up and Cool-down Performed on first and last piece of equipment    Resistance Training Performed Yes    VAD Patient? No    PAD/SET Patient? No      Pain Assessment   Currently in Pain? No/denies                Social History   Tobacco Use  Smoking Status Former   Current packs/day: 0.00   Types: Cigarettes   Quit date: 07/29/1994   Years since quitting: 28.6  Smokeless Tobacco Never    Goals Met:  Proper associated with RPD/PD & O2 Sat Independence with exercise equipment Exercise tolerated well No report of concerns or symptoms today  Goals Unmet:  Not Applicable  Comments: Pt able to follow exercise prescription today without complaint.  Will continue to monitor for progression.    Dr. Oneil Pinal is Medical Director for [facility] Cardiac Rehabilitation.  Dr. Fuad Aleskerov is Medical Director for [facility] Pulmonary Rehabilitation.

## 2023-03-13 DIAGNOSIS — U099 Post covid-19 condition, unspecified: Secondary | ICD-10-CM

## 2023-03-13 NOTE — Progress Notes (Signed)
 Pulmonary Individual Treatment Plan  Patient Details  Name: Rebecca Kirby MRN: [id number] Date of Birth: 11-Apr-1945 Referring Provider:   Flowsheet Row Pulmonary Rehab from 01/14/2023 in [facility] Cardiac and Pulmonary Rehab  Referring Provider Dr. Erskin Hearing, MD       Initial Encounter Date:  Flowsheet Row Pulmonary Rehab from 01/14/2023 in [facility] Cardiac and Pulmonary Rehab  Date 01/14/23       Visit Diagnosis: Post-COVID chronic dyspnea  Patient's Home Medications on Admission:  Current Outpatient Medications:    Accu-Chek FastClix Lancets MISC, USE 1 EACH FOUR TIMES DAILY. USE AS INSTRUCTED, Disp: , Rfl:    acetaminophen  (TYLENOL ) 500 MG tablet, Take 500-1,000 mg by mouth 2 (two) times daily., Disp: , Rfl:    acetaminophen  (TYLENOL ) 500 MG tablet, Take by mouth., Disp: , Rfl:    albuterol  (VENTOLIN  HFA) 108 (90 Base) MCG/ACT inhaler, Inhale 1 puff into the lungs every 6 (six) hours as needed for wheezing or shortness of breath., Disp: 18 g, Rfl: 0   ALPRAZolam  (XANAX ) 0.25 MG tablet, Take by mouth., Disp: , Rfl:    B-D UF III MINI PEN NEEDLES 31G X 5 MM MISC, 4 (four) times daily. as directed, Disp: , Rfl:    benzonatate (TESSALON) 200 MG capsule, , Disp: , Rfl:    calcium  acetate (PHOSLO ) 667 MG capsule, Take 1 capsule by mouth 2 (two) times daily., Disp: , Rfl:    cephALEXin  (KEFLEX ) 250 MG capsule, , Disp: , Rfl:    chlorpheniramine-HYDROcodone  (TUSSIONEX) 10-8 MG/5ML, SHAKE LIQUID AND TAKE 5 ML BY MOUTH EVERY 12 HOURS AS NEEDED FOR COUGH, Disp: , Rfl:    citalopram  (CELEXA ) 40 MG tablet, Take 40 mg by mouth daily., Disp: , Rfl:    citalopram  (CELEXA ) 40 MG tablet, Take 1 tablet by mouth daily., Disp: , Rfl:    dexamethasone  (DECADRON ) 2 MG tablet, TAKE 1 TABLET DAILY WITH BREAKFAST FOR 20 DAYS THEN TAKE 1/2 TABLET DAILY FOR 10 DAYS, Disp: , Rfl:    empagliflozin (JARDIANCE) 10 MG TABS tablet, Take 1 tablet by mouth daily., Disp: , Rfl:    Ensure Max Protein (ENSURE MAX  PROTEIN) LIQD, Take 330 mLs (11 oz total) by mouth 2 (two) times daily between meals. (Patient not taking: Reported on 01/10/2023), Disp: 660 mL, Rfl: 0   Ferrous Fum-Iron  Polysacch 53-53 MG CAPS, Take by mouth., Disp: , Rfl:    ferrous sulfate  325 (65 FE) MG tablet, Take 1 tablet (325 mg total) by mouth daily., Disp: 30 tablet, Rfl: 0   fexofenadine (ALLEGRA) 180 MG tablet, Take by mouth., Disp: , Rfl:    fluconazole (DIFLUCAN) 150 MG tablet, Take by mouth., Disp: , Rfl:    fluticasone  (FLONASE ) 50 MCG/ACT nasal spray, Place 2 sprays into both nostrils daily. (Patient not taking: Reported on 01/10/2023), Disp: , Rfl:    fluticasone  (FLONASE ) 50 MCG/ACT nasal spray, Place into the nose., Disp: , Rfl:    furosemide  (LASIX ) 40 MG tablet, TAKE 1 TABLET BY MOUTH ONCE DAILY AS NEEDED FOR EDEMA HOLD IF SYSTOLIC BLOOD PRESSURE<100, Disp: , Rfl:    HUMALOG  KWIKPEN 100 UNIT/ML KwikPen, Inject 0.08 mLs (8 Units total) into the skin 3 (three) times daily. Per sliding scale, Disp: 15 mL, Rfl: 11   HYDROcodone -acetaminophen  (NORCO) 10-325 MG tablet, Take by mouth., Disp: , Rfl:    ipratropium-albuterol  (DUONEB) 0.5-2.5 (3) MG/3ML SOLN, Inhale into the lungs., Disp: , Rfl:    LAGEVRIO 200 MG CAPS capsule, TAKE FOUR CAPSULES BY MOUTH  TWO TIMES A DAY FOR FIVE DAYS (Patient not taking: Reported on 01/10/2023), Disp: , Rfl:    levothyroxine  (SYNTHROID ) 100 MCG tablet, Take by mouth., Disp: , Rfl:    levothyroxine  (SYNTHROID ) 125 MCG tablet, Take by mouth., Disp: , Rfl:    levothyroxine  (SYNTHROID , LEVOTHROID) 175 MCG tablet, Take 175 mcg by mouth daily before breakfast., Disp: , Rfl:    losartan (COZAAR) 25 MG tablet, Take 1 tablet by mouth at bedtime., Disp: , Rfl:    magnesium  oxide (MAG-OX) 400 (240 Mg) MG tablet, , Disp: , Rfl:    magnesium  oxide (MAG-OX) 400 MG tablet, Take by mouth., Disp: , Rfl:    metoprolol tartrate (LOPRESSOR) 25 MG tablet, , Disp: , Rfl:    pantoprazole  (PROTONIX ) 40 MG tablet, Take 40  mg by mouth daily., Disp: , Rfl:    predniSONE (DELTASONE) 10 MG tablet, , Disp: , Rfl:    propranolol (INDERAL) 40 MG tablet, , Disp: , Rfl:    sulfamethoxazole -trimethoprim  (BACTRIM ) 400-80 MG tablet, TAKE 1 TABLET BY MOUTH DAILY FOR 21 DAYS (Patient not taking: Reported on 01/10/2023), Disp: , Rfl:    TRESIBA  FLEXTOUCH 200 UNIT/ML SOPN, Inject 26 Units into the skin every evening., Disp: , Rfl:    verapamil (CALAN-SR) 120 MG CR tablet, Take by mouth., Disp: , Rfl:    vitamin B-12 (CYANOCOBALAMIN ) 1000 MCG tablet, Take 1,000 mcg by mouth daily. Reported on 07/07/2015, Disp: , Rfl:   Past Medical History: Past Medical History:  Diagnosis Date   Anemia    Anxiety    Colon cancer (HCC)    Diabetes mellitus without complication (HCC)    Family history of adverse reaction to anesthesia    daughter had reaction to anesthesia and was in icu   GERD (gastroesophageal reflux disease)    Hypertension    Hypothyroidism    Kidney cancer, primary, with metastasis from kidney to other site [facility]) 08/26/2014   right   Pancreas cancer (HCC) 2018   Thyroid  cancer (HCC)     Tobacco Use: Social History   Tobacco Use  Smoking Status Former   Current packs/day: 0.00   Types: Cigarettes   Quit date: 07/29/1994   Years since quitting: 28.6  Smokeless Tobacco Never    Labs: Review Flowsheet       Latest Ref Rng & Units 07/29/2014 03/19/2019  Labs for ITP Cardiac and Pulmonary Rehab  Trlycerides <150 mg/dL - 409   Hemoglobin W1X 4.8 - 5.6 % 7.3  7.4      Pulmonary Assessment Scores:  Pulmonary Assessment Scores     Row Name 01/14/23 1516         ADL UCSD   ADL Phase Entry     SOB Score total 81     Rest 1     Walk 3     Stairs 5     Bath 2     Dress 3     Shop 3       CAT Score   CAT Score 23       mMRC Score   mMRC Score 2              UCSD: Self-administered rating of dyspnea associated with activities of daily living (ADLs) 6-point scale (0 = "not at all" to 5 =  "maximal or unable to do because of breathlessness")  Scoring Scores range from 0 to 120.  Minimally important difference is 5 units  CAT: CAT can identify the health  impairment of COPD patients and is better correlated with disease progression.  CAT has a scoring range of zero to 40. The CAT score is classified into four groups of low (less than 10), medium (10 - 20), high (21-30) and very high (31-40) based on the impact level of disease on health status. A CAT score over 10 suggests significant symptoms.  A worsening CAT score could be explained by an exacerbation, poor medication adherence, poor inhaler technique, or progression of COPD or comorbid conditions.  CAT MCID is 2 points  mMRC: mMRC (Modified Medical Research Council) Dyspnea Scale is used to assess the degree of baseline functional disability in patients of respiratory disease due to dyspnea. No minimal important difference is established. A decrease in score of 1 point or greater is considered a positive change.   Pulmonary Function Assessment:  Pulmonary Function Assessment - 01/10/23 0929       Breath   Shortness of Breath Yes;Limiting activity             Exercise Target Goals: Exercise Program Goal: Individual exercise prescription set using results from initial 6 min walk test and THRR while considering  patient's activity barriers and safety.   Exercise Prescription Goal: Initial exercise prescription builds to 30-45 minutes a day of aerobic activity, 2-3 days per week.  Home exercise guidelines will be given to patient during program as part of exercise prescription that the participant will acknowledge.  Education: Aerobic Exercise: - Group verbal and visual presentation on the components of exercise prescription. Introduces F.I.T.T principle from ACSM for exercise prescriptions.  Reviews F.I.T.T. principles of aerobic exercise including progression. Written material given at graduation.   Education:  Resistance Exercise: - Group verbal and visual presentation on the components of exercise prescription. Introduces F.I.T.T principle from ACSM for exercise prescriptions  Reviews F.I.T.T. principles of resistance exercise including progression. Written material given at graduation.    Education: Exercise & Equipment Safety: - Individual verbal instruction and demonstration of equipment use and safety with use of the equipment. Flowsheet Row Pulmonary Rehab from 01/14/2023 in [facility] Cardiac and Pulmonary Rehab  Date 01/10/23  Educator [facility]  Instruction Review Code 1- Verbalizes Understanding       Education: Exercise Physiology & General Exercise Guidelines: - Group verbal and written instruction with models to review the exercise physiology of the cardiovascular system and associated critical values. Provides general exercise guidelines with specific guidelines to those with heart or lung disease.    Education: Flexibility, Balance, Mind/Body Relaxation: - Group verbal and visual presentation with interactive activity on the components of exercise prescription. Introduces F.I.T.T principle from ACSM for exercise prescriptions. Reviews F.I.T.T. principles of flexibility and balance exercise training including progression. Also discusses the mind body connection.  Reviews various relaxation techniques to help reduce and manage stress (i.e. Deep breathing, progressive muscle relaxation, and visualization). Balance handout provided to take home. Written material given at graduation.   Activity Barriers & Risk Stratification:  Activity Barriers & Cardiac Risk Stratification - 01/14/23 1526       Activity Barriers & Cardiac Risk Stratification   Activity Barriers Left Knee Replacement;Right Knee Replacement;Balance Concerns;History of Falls;Shortness of Breath             6 Minute Walk:  6 Minute Walk     Row Name 01/14/23 1520         6 Minute Walk   Phase Initial     Distance 560  feet     Walk Time 4.08  minutes     # of Rest Breaks 1     MPH 1.56     METS 1.23     RPE 13     Perceived Dyspnea  2     VO2 Peak 4.31     Symptoms Yes (comment)     Comments SOB     Resting HR 96 bpm     Resting BP 122/62     Resting Oxygen Saturation  98 %     Exercise Oxygen Saturation  during 6 min walk 91 %     Max Ex. HR 137 bpm     Max Ex. BP 148/66     2 Minute Post BP 136/62       Interval HR   1 Minute HR 114     2 Minute HR 127     3 Minute HR 137     4 Minute HR 117     5 Minute HR 109     6 Minute HR 113     2 Minute Post HR 102     Interval Heart Rate? Yes       Interval Oxygen   Interval Oxygen? Yes     Baseline Oxygen Saturation % 98 %     1 Minute Oxygen Saturation % 91 %     1 Minute Liters of Oxygen 2 L  Pulsed     2 Minute Oxygen Saturation % 91 %     2 Minute Liters of Oxygen 2 L     3 Minute Oxygen Saturation % 92 %     3 Minute Liters of Oxygen 2 L     4 Minute Oxygen Saturation % 92 %     4 Minute Liters of Oxygen 2 L     5 Minute Oxygen Saturation % 96 %     5 Minute Liters of Oxygen 2 L     6 Minute Oxygen Saturation % 93 %     6 Minute Liters of Oxygen 2 L     2 Minute Post Oxygen Saturation % 98 %     2 Minute Post Liters of Oxygen 2 L             Oxygen Initial Assessment:  Oxygen Initial Assessment - 01/10/23 0927       Home Oxygen   Home Oxygen Device Home Concentrator;Portable Concentrator    Sleep Oxygen Prescription Continuous    Liters per minute 2    Home Exercise Oxygen Prescription Continuous    Liters per minute 2    Home Resting Oxygen Prescription Continuous    Liters per minute 2    Compliance with Home Oxygen Use Yes      Initial 6 min Walk   Oxygen Used Continuous;Portable Concentrator    Liters per minute 2      Program Oxygen Prescription   Program Oxygen Prescription Continuous    Liters per minute 2      Intervention   Short Term Goals To learn and exhibit compliance with exercise, home and  travel O2 prescription;To learn and understand importance of monitoring SPO2 with pulse oximeter and demonstrate accurate use of the pulse oximeter.;To learn and understand importance of maintaining oxygen saturations>88%;To learn and demonstrate proper pursed lip breathing techniques or other breathing techniques. ;To learn and demonstrate proper use of respiratory medications    Long  Term Goals Exhibits compliance with exercise, home  and travel O2 prescription;Verbalizes importance of monitoring SPO2 with  pulse oximeter and return demonstration;Maintenance of O2 saturations>88%;Exhibits proper breathing techniques, such as pursed lip breathing or other method taught during program session;Compliance with respiratory medication;Demonstrates proper use of MDI's             Oxygen Re-Evaluation:  Oxygen Re-Evaluation     Row Name 01/17/23 1409 02/21/23 1354           Program Oxygen Prescription   Program Oxygen Prescription -- Continuous      Liters per minute -- 2        Home Oxygen   Home Oxygen Device -- Home Concentrator;Portable Concentrator      Sleep Oxygen Prescription -- Continuous      Liters per minute -- 2      Home Exercise Oxygen Prescription -- Continuous      Liters per minute -- 2      Home Resting Oxygen Prescription -- Continuous      Liters per minute -- 2      Compliance with Home Oxygen Use -- Yes        Goals/Expected Outcomes   Short Term Goals -- To learn and demonstrate proper pursed lip breathing techniques or other breathing techniques.       Long  Term Goals -- Exhibits proper breathing techniques, such as pursed lip breathing or other method taught during program session      Comments Reviewed PLB technique with pt.  Talked about how it works and it's importance in maintaining their exercise saturations. Informed patient how to perform the Pursed Lipped breathing technique. Told patient to Inhale through the nose and out the mouth with pursed lips to  keep their airways open, help oxygenate them better, practice when at rest or doing strenuous activity. Patient Verbalizes understanding of technique and will work on and be reiterated during [facility].      Goals/Expected Outcomes Short: Become more profiecient at using PLB. Long: Become independent at using PLB. Short: use PLB with exertion. Long: use PLB on exertion proficiently and independently.               Oxygen Discharge (Final Oxygen Re-Evaluation):  Oxygen Re-Evaluation - 02/21/23 1354       Program Oxygen Prescription   Program Oxygen Prescription Continuous    Liters per minute 2      Home Oxygen   Home Oxygen Device Home Concentrator;Portable Concentrator    Sleep Oxygen Prescription Continuous    Liters per minute 2    Home Exercise Oxygen Prescription Continuous    Liters per minute 2    Home Resting Oxygen Prescription Continuous    Liters per minute 2    Compliance with Home Oxygen Use Yes      Goals/Expected Outcomes   Short Term Goals To learn and demonstrate proper pursed lip breathing techniques or other breathing techniques.     Long  Term Goals Exhibits proper breathing techniques, such as pursed lip breathing or other method taught during program session    Comments Informed patient how to perform the Pursed Lipped breathing technique. Told patient to Inhale through the nose and out the mouth with pursed lips to keep their airways open, help oxygenate them better, practice when at rest or doing strenuous activity. Patient Verbalizes understanding of technique and will work on and be reiterated during [facility].    Goals/Expected Outcomes Short: use PLB with exertion. Long: use PLB on exertion proficiently and independently.  Initial Exercise Prescription:  Initial Exercise Prescription - 01/14/23 1500       Date of Initial Exercise RX and Referring Provider   Date 01/14/23    Referring Provider Dr. Fuad Aleskerov, MD      Oxygen    Oxygen Continuous    Liters 2    Maintain Oxygen Saturation 88% or higher      Recumbant Bike   Level 1    RPM 50    Watts 15    Minutes 15    METs 1.23      NuStep   Level 1    SPM 80    Minutes 15    METs 1.23      Track   Laps 10    Minutes 15    METs 1.54      Prescription Details   Frequency (times per week) 2    Duration Progress to 30 minutes of continuous aerobic without signs/symptoms of physical distress      Intensity   THRR 40-80% of Max Heartrate 114-132    Ratings of Perceived Exertion 11-13    Perceived Dyspnea 0-4      Progression   Progression Continue to progress workloads to maintain intensity without signs/symptoms of physical distress.      Resistance Training   Training Prescription Yes    Weight 4 lb    Reps 10-15             Perform Capillary Blood Glucose checks as needed.  Exercise Prescription Changes:   Exercise Prescription Changes     Row Name 01/14/23 1500 01/29/23 1400 02/12/23 1400 02/26/23 0800       Response to Exercise   Blood Pressure (Admit) 122/62 136/68 120/56 124/68    Blood Pressure (Exercise) 148/66 158/70 154/60 144/72    Blood Pressure (Exit) 136/62 122/60 136/60 122/54    Heart Rate (Admit) 96 bpm 91 bpm 87 bpm 96 bpm    Heart Rate (Exercise) 137 bpm 103 bpm 106 bpm 106 bpm    Heart Rate (Exit) 102 bpm 90 bpm 94 bpm 100 bpm    Oxygen Saturation (Admit) 98 % 96 % 96 % 97 %    Oxygen Saturation (Exercise) 91 % 92 % 91 % 91 %    Oxygen Saturation (Exit) 98 % 92 % 91 % 94 %    Rating of Perceived Exertion (Exercise) 13 13 12 14     Perceived Dyspnea (Exercise) 2 0 0 0    Symptoms SOB SOB SOB SOB    Comments Results First two days of exercise -- --    Duration -- Progress to 30 minutes of  aerobic without signs/symptoms of physical distress Continue with 30 min of aerobic exercise without signs/symptoms of physical distress. Continue with 30 min of aerobic exercise without signs/symptoms of physical  distress.    Intensity -- THRR unchanged THRR unchanged THRR unchanged      Progression   Progression -- Continue to progress workloads to maintain intensity without signs/symptoms of physical distress. Continue to progress workloads to maintain intensity without signs/symptoms of physical distress. Continue to progress workloads to maintain intensity without signs/symptoms of physical distress.    Average METs -- 2.35 2.13 1.83      Resistance Training   Training Prescription -- Yes Yes Yes    Weight -- 4 lb 4 lb 4 lb    Reps -- 10-15 10-15 10-15      Interval Training   Interval Training --  No No No      Oxygen   Oxygen -- Continuous Continuous Continuous    Liters -- 2 0-2 1      Recumbant Bike   Level -- 2 1 --    Watts -- 18 18 --    Minutes -- 15 15 --    METs -- 2.6 2.6 --      NuStep   Level -- 1 3 2     Minutes -- 15 30 30     METs -- 2.3 2.1 --      Arm Ergometer   Level -- -- -- 1    Minutes -- -- -- 15    METs -- -- -- 1.7      T5 Nustep   Level -- -- -- 1    Minutes -- -- -- 15    METs -- -- -- 2      Oxygen   Maintain Oxygen Saturation -- 88% or higher 88% or higher 88% or higher             Exercise Comments:   Exercise Comments     Row Name 01/17/23 1408           Exercise Comments First full day of exercise!  Patient was oriented to gym and equipment including functions, settings, policies, and procedures.  Patient's individual exercise prescription and treatment plan were reviewed.  All starting workloads were established based on the results of the 6 minute walk test done at initial orientation visit.  The plan for exercise progression was also introduced and progression will be customized based on patient's performance and goals.                Exercise Goals and Review:   Exercise Goals     Row Name 01/14/23 1526             Exercise Goals   Increase Physical Activity Yes       Intervention Provide advice, education,  support and counseling about physical activity/exercise needs.;Develop an individualized exercise prescription for aerobic and resistive training based on initial evaluation findings, risk stratification, comorbidities and participant's personal goals.       Expected Outcomes Short Term: Attend rehab on a regular basis to increase amount of physical activity.;Long Term: Exercising regularly at least 3-5 days a week.;Long Term: Add in home exercise to make exercise part of routine and to increase amount of physical activity.       Increase Strength and Stamina Yes       Intervention Develop an individualized exercise prescription for aerobic and resistive training based on initial evaluation findings, risk stratification, comorbidities and participant's personal goals.;Provide advice, education, support and counseling about physical activity/exercise needs.       Expected Outcomes Short Term: Increase workloads from initial exercise prescription for resistance, speed, and METs.;Short Term: Perform resistance training exercises routinely during rehab and add in resistance training at home;Long Term: Improve cardiorespiratory fitness, muscular endurance and strength as measured by increased METs and functional capacity ( )       Able to understand and use rate of perceived exertion (RPE) scale Yes       Intervention Provide education and explanation on how to use RPE scale       Expected Outcomes Short Term: Able to use RPE daily in rehab to express subjective intensity level;Long Term:  Able to use RPE to guide intensity level when exercising independently       Able to understand  and use Dyspnea scale Yes       Intervention Provide education and explanation on how to use Dyspnea scale       Expected Outcomes Short Term: Able to use Dyspnea scale daily in rehab to express subjective sense of shortness of breath during exertion;Long Term: Able to use Dyspnea scale to guide intensity level when exercising  independently       Knowledge and understanding of Target Heart Rate Range (THRR) Yes       Intervention Provide education and explanation of THRR including how the numbers were predicted and where they are located for reference       Expected Outcomes Short Term: Able to state/look up THRR;Long Term: Able to use THRR to govern intensity when exercising independently;Short Term: Able to use daily as guideline for intensity in rehab       Able to check pulse independently Yes       Intervention Provide education and demonstration on how to check pulse in carotid and radial arteries.;Review the importance of being able to check your own pulse for safety during independent exercise       Expected Outcomes Short Term: Able to explain why pulse checking is important during independent exercise;Long Term: Able to check pulse independently and accurately       Understanding of Exercise Prescription Yes       Intervention Provide education, explanation, and written materials on patient's individual exercise prescription       Expected Outcomes Short Term: Able to explain program exercise prescription;Long Term: Able to explain home exercise prescription to exercise independently                Exercise Goals Re-Evaluation :  Exercise Goals Re-Evaluation     Row Name 01/17/23 1408 01/29/23 1454 02/12/23 1444 02/26/23 0810       Exercise Goal Re-Evaluation   Exercise Goals Review Able to understand and use rate of perceived exertion (RPE) scale;Able to understand and use Dyspnea scale;Knowledge and understanding of Target Heart Rate Range (THRR);Understanding of Exercise Prescription Increase Physical Activity;Increase Strength and Stamina;Understanding of Exercise Prescription Increase Physical Activity;Increase Strength and Stamina;Understanding of Exercise Prescription Increase Physical Activity;Increase Strength and Stamina;Understanding of Exercise Prescription    Comments Reviewed RPE and  dyspnea scale, THR and program prescription with pt today.  Pt voiced understanding and was given a copy of goals to take home. Rebecca Kirby is off to a good start in rehab. She has done well on the T4 nustep at level 1 and improved to level 2 on the recumbent bike. She also has done well with 4 lb hand weights for resistance training. She has maintained her O2 saturations above 92% during exercise as well. We will continue to monitor her progress in the program. Rebecca Kirby is doing well in the program. She continues to do well on the T4 nustep and improved to level 3. She also has done well on the recumbent bike at level 1 and continues to use 4 lb hand weights for resistance training. She has not done any walking in the program up to this point. We will continue to monitor her progress in the program. Rebecca Kirby is doing well in the program. She began using the arm crank and T5 nustep and did well at level 1 on each. She has not done any walking in the program up to this point, but we will encourage her to begin doing so. We will continue to monitor her progresss in the program.  Expected Outcomes Short: Use RPE daily to regulate intensity. Long: Follow program prescription in THR. Short: Continue to follow current exercise prescription. Long: Continue exercise to improve strength and stamina. Short: Begin walking the track in rehab. Long: Continue exercise to improve strength and stamina. Short: Begin walking the track in rehab. Long: Continue exercise to improve strength and stamina.             Discharge Exercise Prescription (Final Exercise Prescription Changes):  Exercise Prescription Changes - 02/26/23 0800       Response to Exercise   Blood Pressure (Admit) 124/68    Blood Pressure (Exercise) 144/72    Blood Pressure (Exit) 122/54    Heart Rate (Admit) 96 bpm    Heart Rate (Exercise) 106 bpm    Heart Rate (Exit) 100 bpm    Oxygen Saturation (Admit) 97 %    Oxygen Saturation (Exercise) 91 %    Oxygen  Saturation (Exit) 94 %    Rating of Perceived Exertion (Exercise) 14    Perceived Dyspnea (Exercise) 0    Symptoms SOB    Duration Continue with 30 min of aerobic exercise without signs/symptoms of physical distress.    Intensity THRR unchanged      Progression   Progression Continue to progress workloads to maintain intensity without signs/symptoms of physical distress.    Average METs 1.83      Resistance Training   Training Prescription Yes    Weight 4 lb    Reps 10-15      Interval Training   Interval Training No      Oxygen   Oxygen Continuous    Liters 1      NuStep   Level 2    Minutes 30      Arm Ergometer   Level 1    Minutes 15    METs 1.7      T5 Nustep   Level 1    Minutes 15    METs 2      Oxygen   Maintain Oxygen Saturation 88% or higher             Nutrition:  Target Goals: Understanding of nutrition guidelines, daily intake of sodium 1500mg , cholesterol 200mg , calories 30% from fat and 7% or less from saturated fats, daily to have 5 or more servings of fruits and vegetables.  Education: All About Nutrition: -Group instruction provided by verbal, written material, interactive activities, discussions, models, and posters to present general guidelines for heart healthy nutrition including fat, fiber, MyPlate, the role of sodium in heart healthy nutrition, utilization of the nutrition label, and utilization of this knowledge for meal planning. Follow up email sent as well. Written material given at graduation.   Biometrics:  Pre Biometrics - 01/14/23 1527       Pre Biometrics   Height 5' 5.5" (1.664 m)    Weight 211 lb (95.7 kg)    Waist Circumference 43.5 inches    Hip Circumference 50 inches    Waist to Hip Ratio 0.87 %    BMI (Calculated) 34.57    Single Leg Stand 2.85 seconds              Nutrition Therapy Plan and Nutrition Goals:  Nutrition Therapy & Goals - 01/14/23 1514       Nutrition Therapy   RD appointment  deferred Yes      Intervention Plan   Intervention Prescribe, educate and counsel regarding individualized specific dietary modifications aiming towards targeted core  components such as weight, hypertension, lipid management, diabetes, heart failure and other comorbidities.    Expected Outcomes Short Term Goal: Understand basic principles of dietary content, such as calories, fat, sodium, cholesterol and nutrients.;Short Term Goal: A plan has been developed with personal nutrition goals set during dietitian appointment.;Long Term Goal: Adherence to prescribed nutrition plan.             Nutrition Assessments:  MEDIFICTS Score Key: >=70 Need to make dietary changes  40-70 Heart Healthy Diet <= 40 Therapeutic Level Cholesterol Diet  Flowsheet Row Pulmonary Rehab from 01/14/2023 in [facility]  Picture Your Plate Total Score on Admission 46      Picture Your Plate Scores: <16 Unhealthy dietary pattern with much room for improvement. 41-50 Dietary pattern unlikely to meet recommendations for good health and room for improvement. 51-60 More healthful dietary pattern, with some room for improvement.  >60 Healthy dietary pattern, although there may be some specific behaviors that could be improved.   Nutrition Goals Re-Evaluation:  Nutrition Goals Re-Evaluation     Row Name 02/12/23 0931 02/21/23 1353           Goals   Current Weight 208 lb 11.2 oz (94.7 kg) --      Nutrition Goal RD appointment deferred --      Comment -- Patient was informed on why it is important to maintain a balanced diet when dealing with Respiratory issues. Explained that it takes a lot of energy to breath and when they are short of breath often they will need to have a good diet to help keep up with the calories they are expending for breathing.      Expected Outcome -- Short: Choose and plan snacks accordingly to patients caloric intake to improve breathing. Long: Maintain a diet  independently that meets their caloric intake to aid in daily shortness of breath.               Nutrition Goals Discharge (Final Nutrition Goals Re-Evaluation):  Nutrition Goals Re-Evaluation - 02/21/23 1353       Goals   Comment Patient was informed on why it is important to maintain a balanced diet when dealing with Respiratory issues. Explained that it takes a lot of energy to breath and when they are short of breath often they will need to have a good diet to help keep up with the calories they are expending for breathing.    Expected Outcome Short: Choose and plan snacks accordingly to patients caloric intake to improve breathing. Long: Maintain a diet independently that meets their caloric intake to aid in daily shortness of breath.             Psychosocial: Target Goals: Acknowledge presence or absence of significant depression and/or stress, maximize coping skills, provide positive support system. Participant is able to verbalize types and ability to use techniques and skills needed for reducing stress and depression.   Education: Stress, Anxiety, and Depression - Group verbal and visual presentation to define topics covered.  Reviews how body is impacted by stress, anxiety, and depression.  Also discusses healthy ways to reduce stress and to treat/manage anxiety and depression.  Written material given at graduation.   Education: Sleep Hygiene -Provides group verbal and written instruction about how sleep can affect your health.  Define sleep hygiene, discuss sleep cycles and impact of sleep habits. Review good sleep hygiene tips.    Initial Review & Psychosocial Screening:  Initial Psych Review &  Screening - 01/10/23 0931       Initial Review   Current issues with Current Psychotropic Meds;Current Depression;History of Depression      Family Dynamics   Good Support System? Yes    Comments Rebecca Kirby has a lot of friends, chruch and family that support her. She does take  medication for depression. She is ready to get started in the program since her last bout with Covid she has been more short of breath.      Barriers   Psychosocial barriers to participate in program The patient should benefit from training in stress management and relaxation.      Screening Interventions   Interventions Encouraged to exercise;To provide support and resources with identified psychosocial needs;Provide feedback about the scores to participant    Expected Outcomes Short Term goal: Utilizing psychosocial counselor, staff and physician to assist with identification of specific Stressors or current issues interfering with healing process. Setting desired goal for each stressor or current issue identified.;Long Term Goal: Stressors or current issues are controlled or eliminated.;Short Term goal: Identification and review with participant of any Quality of Life or Depression concerns found by scoring the questionnaire.;Long Term goal: The participant improves quality of Life and PHQ9 Scores as seen by post scores and/or verbalization of changes             Quality of Life Scores:  Scores of 19 and below usually indicate a poorer quality of life in these areas.  A difference of  2-3 points is a clinically meaningful difference.  A difference of 2-3 points in the total score of the Quality of Life Index has been associated with significant improvement in overall quality of life, self-image, physical symptoms, and general health in studies assessing change in quality of life.  PHQ-9: Review Flowsheet       01/14/2023  Depression screen PHQ 2/9  Decreased Interest 0  Down, Depressed, Hopeless 0  PHQ - 2 Score 0  Altered sleeping 2  Tired, decreased energy 0  Change in appetite 0  Feeling bad or failure about yourself  0  Trouble concentrating 1  Moving slowly or fidgety/restless 0  Suicidal thoughts 1  PHQ-9 Score 4  Difficult doing work/chores Somewhat difficult    Interpretation of Total Score  Total Score Depression Severity:  1-4 = Minimal depression, 5-9 = Mild depression, 10-14 = Moderate depression, 15-19 = Moderately severe depression, 20-27 = Severe depression   Psychosocial Evaluation and Intervention:  Psychosocial Evaluation - 01/10/23 0933       Psychosocial Evaluation & Interventions   Interventions Encouraged to exercise with the program and follow exercise prescription;Stress management education;Relaxation education    Comments Rebecca Kirby has a lot of friends, chruch and family that support her. She does take medication for depression. She is ready to get started in the program since her last bout with Covid she has been more short of breath.    Expected Outcomes Short: Start [facility] to help with mood. Long: Maintain a healthy mental state.    Continue Psychosocial Services  Follow up required by staff             Psychosocial Re-Evaluation:  Psychosocial Re-Evaluation     Row Name 02/12/23 0926 02/21/23 1356           Psychosocial Re-Evaluation   Current issues with None Identified None Identified      Comments Rebecca Kirby states that she has no current stressors, and states that she is getting good sleep.  Her grandson, son and daughter make up her support system, and she can reach out to them when she is dealing with stress. Patient reports no issues with their current mental states, sleep, stress, depression or anxiety. Will follow up with patient in a few weeks for any changes.      Expected Outcomes Short: Continue to attend [facility] regularly for regular exercise and social engagement. Long: Continue to improve symptoms and manage a positive mental state. Short: Continue to exercise regularly to support mental health and notify staff of any changes. Long: maintain mental health and well being through teaching of rehab or prescribed medications independently.      Interventions Encouraged to attend Pulmonary Rehabilitation for the  exercise Encouraged to attend Pulmonary Rehabilitation for the exercise      Continue Psychosocial Services  Follow up required by staff Follow up required by staff               Psychosocial Discharge (Final Psychosocial Re-Evaluation):  Psychosocial Re-Evaluation - 02/21/23 1356       Psychosocial Re-Evaluation   Current issues with None Identified    Comments Patient reports no issues with their current mental states, sleep, stress, depression or anxiety. Will follow up with patient in a few weeks for any changes.    Expected Outcomes Short: Continue to exercise regularly to support mental health and notify staff of any changes. Long: maintain mental health and well being through teaching of rehab or prescribed medications independently.    Interventions Encouraged to attend Pulmonary Rehabilitation for the exercise    Continue Psychosocial Services  Follow up required by staff             Education: Education Goals: Education classes will be provided on a weekly basis, covering required topics. Participant will state understanding/return demonstration of topics presented.  Learning Barriers/Preferences:  Learning Barriers/Preferences - 01/10/23 0930       Learning Barriers/Preferences   Learning Barriers Sight    Learning Preferences None             General Pulmonary Education Topics:  Infection Prevention: - Provides verbal and written material to individual with discussion of infection control including proper hand washing and proper equipment cleaning during exercise session. Flowsheet Row Pulmonary Rehab from 01/14/2023 in [facility] Cardiac and Pulmonary Rehab  Date 01/10/23  Educator [facility]  Instruction Review Code 1- Verbalizes Understanding       Falls Prevention: - Provides verbal and written material to individual with discussion of falls prevention and safety. Flowsheet Row Pulmonary Rehab from 01/14/2023 in [facility] Cardiac and Pulmonary Rehab  Date  01/10/23  Educator [facility]  Instruction Review Code 1- Verbalizes Understanding       Chronic Lung Disease Review: - Group verbal instruction with posters, models, PowerPoint presentations and videos,  to review new updates, new respiratory medications, new advancements in procedures and treatments. Providing information on websites and "800" numbers for continued self-education. Includes information about supplement oxygen, available portable oxygen systems, continuous and intermittent flow rates, oxygen safety, concentrators, and Medicare reimbursement for oxygen. Explanation of Pulmonary Drugs, including class, frequency, complications, importance of spacers, rinsing mouth after steroid MDI's, and proper cleaning methods for nebulizers. Review of basic lung anatomy and physiology related to function, structure, and complications of lung disease. Review of risk factors. Discussion about methods for diagnosing sleep apnea and types of masks and machines for OSA. Includes a review of the use of types of environmental controls: home humidity, furnaces, filters, dust  mite/pet prevention, HEPA vacuums. Discussion about weather changes, air quality and the benefits of nasal washing. Instruction on Warning signs, infection symptoms, calling MD promptly, preventive modes, and value of vaccinations. Review of effective airway clearance, coughing and/or vibration techniques. Emphasizing that all should Create an Action Plan. Written material given at graduation. Flowsheet Row Pulmonary Rehab from 01/14/2023 in [facility] Cardiac and Pulmonary Rehab  Education need identified 01/14/23       AED/CPR: - Group verbal and written instruction with the use of models to demonstrate the basic use of the AED with the basic ABC's of resuscitation.    Anatomy and Cardiac Procedures: - Group verbal and visual presentation and models provide information about basic cardiac anatomy and function. Reviews the testing methods done  to diagnose heart disease and the outcomes of the test results. Describes the treatment choices: Medical Management, Angioplasty, or Coronary Bypass Surgery for treating various heart conditions including Myocardial Infarction, Angina, Valve Disease, and Cardiac Arrhythmias.  Written material given at graduation.   Medication Safety: - Group verbal and visual instruction to review commonly prescribed medications for heart and lung disease. Reviews the medication, class of the drug, and side effects. Includes the steps to properly store meds and maintain the prescription regimen.  Written material given at graduation.   Other: -Provides group and verbal instruction on various topics (see comments)   Knowledge Questionnaire Score:  Knowledge Questionnaire Score - 01/14/23 1516       Knowledge Questionnaire Score   Pre Score 15/18              Core Components/Risk Factors/Patient Goals at Admission:  Personal Goals and Risk Factors at Admission - 01/10/23 0930       Core Components/Risk Factors/Patient Goals on Admission    Weight Management Yes;Weight Maintenance    Intervention Weight Management: Develop a combined nutrition and exercise program designed to reach desired caloric intake, while maintaining appropriate intake of nutrient and fiber, sodium and fats, and appropriate energy expenditure required for the weight goal.;Weight Management: Provide education and appropriate resources to help participant work on and attain dietary goals.;Weight Management/Obesity: Establish reasonable short term and long term weight goals.    Expected Outcomes Short Term: Continue to assess and modify interventions until short term weight is achieved;Long Term: Adherence to nutrition and physical activity/exercise program aimed toward attainment of established weight goal;Weight Maintenance: Understanding of the daily nutrition guidelines, which includes 25-35% calories from fat, 7% or less cal from  saturated fats, less than 200mg  cholesterol, less than 1.5gm of sodium, & 5 or more servings of fruits and vegetables daily;Understanding recommendations for meals to include 15-35% energy as protein, 25-35% energy from fat, 35-60% energy from carbohydrates, less than 200mg  of dietary cholesterol, 20-35 gm of total fiber daily;Understanding of distribution of calorie intake throughout the day with the consumption of 4-5 meals/snacks    Improve shortness of breath with ADL's Yes    Intervention Provide education, individualized exercise plan and daily activity instruction to help decrease symptoms of SOB with activities of daily living.    Expected Outcomes Short Term: Improve cardiorespiratory fitness to achieve a reduction of symptoms when performing ADLs;Long Term: Be able to perform more ADLs without symptoms or delay the onset of symptoms    Diabetes Yes    Intervention Provide education about signs/symptoms and action to take for hypo/hyperglycemia.;Provide education about proper nutrition, including hydration, and aerobic/resistive exercise prescription along with prescribed medications to achieve blood glucose in normal ranges: Fasting glucose  65-99 mg/dL    Expected Outcomes Short Term: Participant verbalizes understanding of the signs/symptoms and immediate care of hyper/hypoglycemia, proper foot care and importance of medication, aerobic/resistive exercise and nutrition plan for blood glucose control.;Long Term: Attainment of HbA1C < 7%.    Hypertension Yes    Intervention Provide education on lifestyle modifcations including regular physical activity/exercise, weight management, moderate sodium restriction and increased consumption of fresh fruit, vegetables, and low fat dairy, alcohol moderation, and smoking cessation.;Monitor prescription use compliance.    Expected Outcomes Short Term: Continued assessment and intervention until BP is < 140/57mm HG in hypertensive participants. < 130/36mm HG  in hypertensive participants with diabetes, heart failure or chronic kidney disease.;Long Term: Maintenance of blood pressure at goal levels.             Education:Diabetes - Individual verbal and written instruction to review signs/symptoms of diabetes, desired ranges of glucose level fasting, after meals and with exercise. Acknowledge that pre and post exercise glucose checks will be done for 3 sessions at entry of program. Flowsheet Row Pulmonary Rehab from 01/14/2023 in [facility] Cardiac and Pulmonary Rehab  Date 01/10/23  Educator [facility]  Instruction Review Code 1- Verbalizes Understanding       Know Your Numbers and Heart Failure: - Group verbal and visual instruction to discuss disease risk factors for cardiac and pulmonary disease and treatment options.  Reviews associated critical values for Overweight/Obesity, Hypertension, Cholesterol, and Diabetes.  Discusses basics of heart failure: signs/symptoms and treatments.  Introduces Heart Failure Zone chart for action plan for heart failure.  Written material given at graduation. Flowsheet Row Pulmonary Rehab from 01/14/2023 in [facility] Pulmonary Rehab  Education need identified 01/14/23       Core Components/Risk Factors/Patient Goals Review:   Goals and Risk Factor Review     Row Name 02/12/23 0932 02/21/23 1352           Core Components/Risk Factors/Patient Goals Review   Personal Goals Review Hypertension Improve shortness of breath with ADL's      Review Rebecca Kirby is taking her blood pressure every other day. She was taking it every day twice a day when she was prescribed new medication but has since gotten a better grasp on her blood pressure medication. Spoke to patient about their shortness of breath and what they can do to improve. Patient has been informed of breathing techniques when starting the program. Patient is informed to tell staff if they have had any med changes and that certain meds they are taking or not  taking can be causing shortness of breath.      Expected Outcomes Short: Continue to take blood pressure regularly. Long: Continue to monitor blood pressure to deal with hypertension. Short: Attend [facility] regularly to improve shortness of breath with ADL's. Long: maintain independence with ADL's               Core Components/Risk Factors/Patient Goals at Discharge (Final Review):   Goals and Risk Factor Review - 02/21/23 1352       Core Components/Risk Factors/Patient Goals Review   Personal Goals Review Improve shortness of breath with ADL's    Review Spoke to patient about their shortness of breath and what they can do to improve. Patient has been informed of breathing techniques when starting the program. Patient is informed to tell staff if they have had any med changes and that certain meds they are taking or not taking can be causing shortness of breath.  Expected Outcomes Short: Attend [facility] regularly to improve shortness of breath with ADL's. Long: maintain independence with ADL's             ITP Comments:  ITP Comments     Row Name 01/10/23 0926 01/14/23 1510 01/16/23 0948 01/17/23 1401 02/13/23 1151   ITP Comments Virtual Visit completed. Patient informed on EP and RD appointment and 6 Minute walk test. Patient also informed of patient health questionnaires on My Chart. Patient Verbalizes understanding. Visit diagnosis can be found in [facility] 01/12/[facility]23. Completed and gym orientation. Initial ITP created and sent for review to Dr. Faud Aleskerov, Medical Director. 30 Day review completed. Medical Director ITP review done, changes made as directed, and signed approval by Medical Director.    new to program First full day of exercise!  Patient was oriented to gym and equipment including functions, settings, policies, and procedures.  Patient's individual exercise prescription and treatment plan were reviewed.  All starting workloads were established based on the results  of the 6 minute walk test done at initial orientation visit.  The plan for exercise progression was also introduced and progression will be customized based on patient's performance and goals. 30 Day review completed. Medical Director ITP review done, changes made as directed, and signed approval by Medical Director.    new to program            Comments: 30 day review

## 2023-03-14 ENCOUNTER — Encounter: Payer: 59 | Admitting: *Deleted

## 2023-03-14 DIAGNOSIS — U099 Post covid-19 condition, unspecified: Secondary | ICD-10-CM

## 2023-03-14 DIAGNOSIS — R0609 Other forms of dyspnea: Secondary | ICD-10-CM | POA: Diagnosis not present

## 2023-03-14 NOTE — Progress Notes (Signed)
Daily Session Note  Patient Details  Name: Rebecca STAMANT MRN: [id number] Date of Birth: 13-Dec-1945 Referring Provider:   Flowsheet Row Pulmonary Rehab from 01/14/2023 in [facility] Cardiac and Pulmonary Rehab  Referring Provider Dr. Vida Rigger, MD       Encounter Date: 03/14/2023  Check In:  Session Check In - 03/14/23 1552       Check-In   Supervising physician immediately available to respond to emergencies See telemetry face sheet for immediately available ER MD    Location [facility]-Cardiac & Pulmonary Rehab    Staff Present Cora Collum, RN, BSN, CCRP;Joseph Hood RCP,RRT,BSRT;Meredith Craven RN,BSN;Noah [place], [place], Exercise Physiologist    Virtual Visit No    Medication changes reported     No    Fall or balance concerns reported    No    Warm-up and Cool-down Performed on first and last piece of equipment    Resistance Training Performed Yes    VAD Patient? No    PAD/SET Patient? No      Pain Assessment   Currently in Pain? No/denies                Social History   Tobacco Use  Smoking Status Former   Current packs/day: 0.00   Types: Cigarettes   Quit date: 07/29/1994   Years since quitting: 28.6  Smokeless Tobacco Never    Goals Met:  Proper associated with RPD/PD & O2 Sat Independence with exercise equipment Exercise tolerated well No report of concerns or symptoms today  Goals Unmet:  Not Applicable  Comments: Pt able to follow exercise prescription today without complaint.  Will continue to monitor for progression.    Dr. Bethann Punches is Medical Director for [facility] Cardiac Rehabilitation.  Dr. Vida Rigger is Medical Director for [facility] Pulmonary Rehabilitation.

## 2023-03-21 ENCOUNTER — Encounter: Payer: 59 | Admitting: *Deleted

## 2023-03-21 DIAGNOSIS — R0609 Other forms of dyspnea: Secondary | ICD-10-CM

## 2023-03-21 NOTE — Progress Notes (Signed)
Daily Session Note  Patient Details  Name: Rebecca Kirby MRN: [id number] Date of Birth: [birth year] Referring Provider:   Flowsheet Row Pulmonary Rehab from 01/14/2023 in [facility] Cardiac and Pulmonary Rehab  Referring Provider Dr. Vida Rigger, MD       Encounter Date: 03/21/2023  Check In:  Session Check In - 03/21/23 1347       Check-In   Supervising physician immediately available to respond to emergencies See telemetry face sheet for immediately available ER MD    Location [facility]-Cardiac & Pulmonary Rehab    Staff Present Susann Givens RN,BSN;Susanne Bice, RN, BSN, CCRP;Maxon Conetta BS, Exercise Physiologist;Noah Tickle, BS, Exercise Physiologist    Virtual Visit No    Medication changes reported     No    Fall or balance concerns reported    No    Warm-up and Cool-down Performed on first and last piece of equipment    Resistance Training Performed Yes    VAD Patient? No    PAD/SET Patient? No      Pain Assessment   Currently in Pain? No/denies                Social History   Tobacco Use  Smoking Status Former   Current packs/day: 0.00   Types: Cigarettes   Quit date: 07/29/1994   Years since quitting: 28.6  Smokeless Tobacco Never    Goals Met:  Independence with exercise equipment Exercise tolerated well No report of concerns or symptoms today Strength training completed today  Goals Unmet:  Not Applicable  Comments: Pt able to follow exercise prescription today without complaint.  Will continue to monitor for progression.    Dr. Bethann Punches is Medical Director for [facility] Cardiac Rehabilitation.  Dr. Vida Rigger is Medical Director for [facility] Pulmonary Rehabilitation.

## 2023-03-26 ENCOUNTER — Encounter: Payer: 59 | Admitting: *Deleted

## 2023-03-26 DIAGNOSIS — R0609 Other forms of dyspnea: Secondary | ICD-10-CM | POA: Diagnosis not present

## 2023-03-26 NOTE — Progress Notes (Signed)
Daily Session Note  Patient Details  Name: Rebecca Kirby MRN: [id number] Date of Birth: 04/23/1945 Referring Provider:   Doristine Devoid Pulmonary Rehab from 01/14/2023 in [facility] Cardiac and Pulmonary Rehab  Referring Provider Dr. Vida Rigger, MD       Encounter Date: 03/26/2023  Check In:  Session Check In - 03/26/23 0930       Check-In   Supervising physician immediately available to respond to emergencies See telemetry face sheet for immediately available ER MD    Location [facility]-Cardiac & Pulmonary Rehab    Staff Present Cora Collum, RN, BSN, CCRP;Margaret Best, MS, Exercise Physiologist;Maxon Conetta BS, Exercise Physiologist;Noah Tickle, BS, Exercise Physiologist    Virtual Visit No    Medication changes reported     No    Warm-up and Cool-down Performed on first and last piece of equipment    Resistance Training Performed Yes    VAD Patient? No    PAD/SET Patient? No      Pain Assessment   Currently in Pain? No/denies                Social History   Tobacco Use  Smoking Status Former   Current packs/day: 0.00   Types: Cigarettes   Quit date: 07/29/1994   Years since quitting: 28.6  Smokeless Tobacco Never    Goals Met:  Proper associated with RPD/PD & O2 Sat Independence with exercise equipment Exercise tolerated well No report of concerns or symptoms today  Goals Unmet:  Not Applicable  Comments: Pt able to follow exercise prescription today without complaint.  Will continue to monitor for progression.    Dr. Bethann Punches is Medical Director for [facility] Cardiac Rehabilitation.  Dr. Vida Rigger is Medical Director for [facility] Pulmonary Rehabilitation.

## 2023-03-28 ENCOUNTER — Encounter: Payer: 59 | Admitting: *Deleted

## 2023-03-28 DIAGNOSIS — R0609 Other forms of dyspnea: Secondary | ICD-10-CM | POA: Diagnosis not present

## 2023-03-28 NOTE — Progress Notes (Signed)
Daily Session Note  Patient Details  Name: Rebecca Kirby MRN: [id number] Date of Birth: March 09, 1945 Referring Provider:   Flowsheet Row Pulmonary Rehab from 01/14/2023 in [facility] Cardiac and Pulmonary Rehab  Referring Provider Dr. Vida Rigger, MD       Encounter Date: 03/28/2023  Check In:  Session Check In - 03/28/23 1350       Check-In   Supervising physician immediately available to respond to emergencies See telemetry face sheet for immediately available ER MD    Location [facility]-Cardiac & Pulmonary Rehab    Staff Present Maxon Conetta BS, Exercise Physiologist;Noah Tickle, BS, Exercise Physiologist;Anacaren Kohan Jewel Baize RN,BSN;Joseph Hood RCP,RRT,BSRT    Virtual Visit No    Medication changes reported     No    Fall or balance concerns reported    No    Warm-up and Cool-down Performed on first and last piece of equipment    Resistance Training Performed Yes    VAD Patient? No    PAD/SET Patient? No      Pain Assessment   Currently in Pain? No/denies                Social History   Tobacco Use  Smoking Status Former   Current packs/day: 0.00   Types: Cigarettes   Quit date: 07/29/1994   Years since quitting: 28.6  Smokeless Tobacco Never    Goals Met:  Independence with exercise equipment Exercise tolerated well No report of concerns or symptoms today Strength training completed today  Goals Unmet:  Not Applicable  Comments: Pt able to follow exercise prescription today without complaint.  Will continue to monitor for progression.    Dr. Bethann Punches is Medical Director for [facility] Cardiac Rehabilitation.  Dr. Vida Rigger is Medical Director for [facility] Pulmonary Rehabilitation.

## 2023-04-02 ENCOUNTER — Encounter: Payer: 59 | Attending: Pulmonary Disease | Admitting: *Deleted

## 2023-04-02 DIAGNOSIS — U099 Post covid-19 condition, unspecified: Secondary | ICD-10-CM | POA: Insufficient documentation

## 2023-04-02 DIAGNOSIS — R0609 Other forms of dyspnea: Secondary | ICD-10-CM | POA: Diagnosis present

## 2023-04-02 NOTE — Progress Notes (Signed)
Daily Session Note  Patient Details  Name: Rebecca Kirby MRN: [id number] Date of Birth: 08/11/1945 Referring Provider:   Flowsheet Row Pulmonary Rehab from 01/14/2023 in [facility] Cardiac and Pulmonary Rehab  Referring Provider Dr. Vida Rigger, MD       Encounter Date: 04/02/2023  Check In:  Session Check In - 04/02/23 0957       Check-In   Supervising physician immediately available to respond to emergencies See telemetry face sheet for immediately available ER MD    Location [facility]-Cardiac & Pulmonary Rehab    Staff Present Cora Collum, RN, BSN, CCRP;Margaret Best, MS, Exercise Physiologist;Maxon Conetta BS, Exercise Physiologist;Noah Tickle, BS, Exercise Physiologist    Virtual Visit No    Medication changes reported     No    Fall or balance concerns reported    No    Warm-up and Cool-down Performed on first and last piece of equipment    Resistance Training Performed Yes    VAD Patient? No    PAD/SET Patient? No      Pain Assessment   Currently in Pain? No/denies                Social History   Tobacco Use  Smoking Status Former   Current packs/day: 0.00   Types: Cigarettes   Quit date: 07/29/1994   Years since quitting: 28.6  Smokeless Tobacco Never    Goals Met:  Proper associated with RPD/PD & O2 Sat Independence with exercise equipment Exercise tolerated well No report of concerns or symptoms today  Goals Unmet:  Not Applicable  Comments: Pt able to follow exercise prescription today without complaint.  Will continue to monitor for progression.    Dr. Bethann Punches is Medical Director for [facility] Cardiac Rehabilitation.  Dr. Vida Rigger is Medical Director for [facility] Pulmonary Rehabilitation.

## 2023-04-04 ENCOUNTER — Encounter: Payer: 59 | Admitting: *Deleted

## 2023-04-04 DIAGNOSIS — R0609 Other forms of dyspnea: Secondary | ICD-10-CM

## 2023-04-04 NOTE — Progress Notes (Signed)
 Daily Session Note  Patient Details  Name: Rebecca Kirby MRN: [id number] Date of Birth: 1945/04/11 Referring Provider:   Flowsheet Row Pulmonary Rehab from 01/14/2023 in [facility] Cardiac and Pulmonary Rehab  Referring Provider Dr. Halina Picking, MD       Encounter Date: 04/04/2023  Check In:  Session Check In - 04/04/23 1351       Check-In   Supervising physician immediately available to respond to emergencies See telemetry face sheet for immediately available ER MD    Location [facility]-Cardiac & Pulmonary Rehab    Staff Present Maxon Conetta BS, Exercise Physiologist;Alee Gressman Tressa RN,BSN;Joseph Hood RCP,RRT,BSRT    Virtual Visit No    Medication changes reported     No    Fall or balance concerns reported    No    Warm-up and Cool-down Performed on first and last piece of equipment    Resistance Training Performed Yes    VAD Patient? No    PAD/SET Patient? No      Pain Assessment   Currently in Pain? No/denies                Social History   Tobacco Use  Smoking Status Former   Current packs/day: 0.00   Types: Cigarettes   Quit date: 07/29/1994   Years since quitting: 28.7  Smokeless Tobacco Never    Goals Met:  Independence with exercise equipment Exercise tolerated well No report of concerns or symptoms today Strength training completed today  Goals Unmet:  Not Applicable  Comments: Pt able to follow exercise prescription today without complaint.  Will continue to monitor for progression.    Dr. Oneil Pinal is Medical Director for [facility] Cardiac Rehabilitation.  Dr. Fuad Aleskerov is Medical Director for [facility] Pulmonary Rehabilitation.

## 2023-04-10 DIAGNOSIS — R0609 Other forms of dyspnea: Secondary | ICD-10-CM

## 2023-04-10 NOTE — Progress Notes (Signed)
Pulmonary Individual Treatment Plan  Patient Details  Name: Rebecca Kirby MRN: [id number] Date of Birth: [birth year] Referring Provider:   Flowsheet Row Pulmonary Rehab from 01/14/2023 in [facility] Cardiac and Pulmonary Rehab  Referring Provider Dr. Vida Rigger, MD       Initial Encounter Date:  Flowsheet Row Pulmonary Rehab from 01/14/2023 in [facility] Cardiac and Pulmonary Rehab  Date 01/14/23       Visit Diagnosis: Post-COVID chronic dyspnea  Patient's Home Medications on Admission:  Current Outpatient Medications:    Accu-Chek FastClix Lancets MISC, USE 1 EACH FOUR TIMES DAILY. USE AS INSTRUCTED, Disp: , Rfl:    acetaminophen (TYLENOL) 500 MG tablet, Take 500-1,000 mg by mouth 2 (two) times daily., Disp: , Rfl:    acetaminophen (TYLENOL) 500 MG tablet, Take by mouth., Disp: , Rfl:    albuterol (VENTOLIN HFA) 108 (90 Base) MCG/ACT inhaler, Inhale 1 puff into the lungs every 6 (six) hours as needed for wheezing or shortness of breath., Disp: 18 g, Rfl: 0   ALPRAZolam (XANAX) 0.25 MG tablet, Take by mouth., Disp: , Rfl:    B-D UF III MINI PEN NEEDLES 31G X 5 MM MISC, 4 (four) times daily. as directed, Disp: , Rfl:    benzonatate (TESSALON) 200 MG capsule, , Disp: , Rfl:    calcium acetate (PHOSLO) 667 MG capsule, Take 1 capsule by mouth 2 (two) times daily., Disp: , Rfl:    cephALEXin (KEFLEX) 250 MG capsule, , Disp: , Rfl:    chlorpheniramine-HYDROcodone (TUSSIONEX) 10-8 MG/5ML, SHAKE LIQUID AND TAKE 5 ML BY MOUTH EVERY 12 HOURS AS NEEDED FOR COUGH, Disp: , Rfl:    citalopram (CELEXA) 40 MG tablet, Take 40 mg by mouth daily., Disp: , Rfl:    citalopram (CELEXA) 40 MG tablet, Take 1 tablet by mouth daily., Disp: , Rfl:    dexamethasone (DECADRON) 2 MG tablet, TAKE 1 TABLET DAILY WITH BREAKFAST FOR 20 DAYS THEN TAKE 1/2 TABLET DAILY FOR 10 DAYS, Disp: , Rfl:    empagliflozin (JARDIANCE) 10 MG TABS tablet, Take 1 tablet by mouth daily., Disp: , Rfl:    Ensure Max Protein (ENSURE MAX  PROTEIN) LIQD, Take 330 mLs (11 oz total) by mouth 2 (two) times daily between meals. (Patient not taking: Reported on 01/10/2023), Disp: 660 mL, Rfl: 0   Ferrous Fum-Iron Polysacch 53-53 MG CAPS, Take by mouth., Disp: , Rfl:    ferrous sulfate 325 (65 FE) MG tablet, Take 1 tablet (325 mg total) by mouth daily., Disp: 30 tablet, Rfl: 0   fexofenadine (ALLEGRA) 180 MG tablet, Take by mouth., Disp: , Rfl:    fluconazole (DIFLUCAN) 150 MG tablet, Take by mouth., Disp: , Rfl:    fluticasone (FLONASE) 50 MCG/ACT nasal spray, Place 2 sprays into both nostrils daily. (Patient not taking: Reported on 01/10/2023), Disp: , Rfl:    fluticasone (FLONASE) 50 MCG/ACT nasal spray, Place into the nose., Disp: , Rfl:    furosemide (LASIX) 40 MG tablet, TAKE 1 TABLET BY MOUTH ONCE DAILY AS NEEDED FOR EDEMA HOLD IF SYSTOLIC BLOOD PRESSURE<100, Disp: , Rfl:    HUMALOG KWIKPEN 100 UNIT/ML KwikPen, Inject 0.08 mLs (8 Units total) into the skin 3 (three) times daily. Per sliding scale, Disp: 15 mL, Rfl: 11   HYDROcodone-acetaminophen (NORCO) 10-325 MG tablet, Take by mouth., Disp: , Rfl:    ipratropium-albuterol (DUONEB) 0.5-2.5 (3) MG/3ML SOLN, Inhale into the lungs., Disp: , Rfl:    LAGEVRIO 200 MG CAPS capsule, TAKE FOUR CAPSULES BY MOUTH  TWO TIMES A DAY FOR FIVE DAYS (Patient not taking: Reported on 01/10/2023), Disp: , Rfl:    levothyroxine (SYNTHROID) 100 MCG tablet, Take by mouth., Disp: , Rfl:    levothyroxine (SYNTHROID) 125 MCG tablet, Take by mouth., Disp: , Rfl:    levothyroxine (SYNTHROID, LEVOTHROID) 175 MCG tablet, Take 175 mcg by mouth daily before breakfast., Disp: , Rfl:    losartan (COZAAR) 25 MG tablet, Take 1 tablet by mouth at bedtime., Disp: , Rfl:    magnesium oxide (MAG-OX) 400 (240 Mg) MG tablet, , Disp: , Rfl:    magnesium oxide (MAG-OX) 400 MG tablet, Take by mouth., Disp: , Rfl:    metoprolol tartrate (LOPRESSOR) 25 MG tablet, , Disp: , Rfl:    pantoprazole (PROTONIX) 40 MG tablet, Take 40  mg by mouth daily., Disp: , Rfl:    predniSONE (DELTASONE) 10 MG tablet, , Disp: , Rfl:    propranolol (INDERAL) 40 MG tablet, , Disp: , Rfl:    sulfamethoxazole-trimethoprim (BACTRIM) 400-80 MG tablet, TAKE 1 TABLET BY MOUTH DAILY FOR 21 DAYS (Patient not taking: Reported on 01/10/2023), Disp: , Rfl:    TRESIBA FLEXTOUCH 200 UNIT/ML SOPN, Inject 26 Units into the skin every evening., Disp: , Rfl:    verapamil (CALAN-SR) 120 MG CR tablet, Take by mouth., Disp: , Rfl:    vitamin B-12 (CYANOCOBALAMIN) 1000 MCG tablet, Take 1,000 mcg by mouth daily. Reported on 07/07/2015, Disp: , Rfl:   Past Medical History: Past Medical History:  Diagnosis Date   Anemia    Anxiety    Colon cancer (HCC)    Diabetes mellitus without complication (HCC)    Family history of adverse reaction to anesthesia    daughter had reaction to anesthesia and was in icu   GERD (gastroesophageal reflux disease)    Hypertension    Hypothyroidism    Kidney cancer, primary, with metastasis from kidney to other site [facility]) 08/26/2014   right   Pancreas cancer (HCC) 2018   Thyroid cancer (HCC)     Tobacco Use: Social History   Tobacco Use  Smoking Status Former   Current packs/day: 0.00   Types: Cigarettes   Quit date: 07/29/1994   Years since quitting: 28.7  Smokeless Tobacco Never    Labs: Review Flowsheet       Latest Ref Rng & Units 07/29/2014 03/19/2019  Labs for ITP Cardiac and Pulmonary Rehab  Trlycerides <150 mg/dL - 161   Hemoglobin W9U 4.8 - 5.6 % 7.3  7.4      Pulmonary Assessment Scores:  Pulmonary Assessment Scores     Row Name 01/14/23 1516         ADL UCSD   ADL Phase Entry     SOB Score total 81     Rest 1     Walk 3     Stairs 5     Bath 2     Dress 3     Shop 3       CAT Score   CAT Score 23       mMRC Score   mMRC Score 2              UCSD: Self-administered rating of dyspnea associated with activities of daily living (ADLs) 6-point scale (0 = "not at all" to 5 =  "maximal or unable to do because of breathlessness")  Scoring Scores range from 0 to 120.  Minimally important difference is 5 units  CAT: CAT can identify the health  impairment of COPD patients and is better correlated with disease progression.  CAT has a scoring range of zero to 40. The CAT score is classified into four groups of low (less than 10), medium (10 - 20), high (21-30) and very high (31-40) based on the impact level of disease on health status. A CAT score over 10 suggests significant symptoms.  A worsening CAT score could be explained by an exacerbation, poor medication adherence, poor inhaler technique, or progression of COPD or comorbid conditions.  CAT MCID is 2 points  mMRC: mMRC (Modified Medical Research Council) Dyspnea Scale is used to assess the degree of baseline functional disability in patients of respiratory disease due to dyspnea. No minimal important difference is established. A decrease in score of 1 point or greater is considered a positive change.   Pulmonary Function Assessment:  Pulmonary Function Assessment - 01/10/23 0929       Breath   Shortness of Breath Yes;Limiting activity             Exercise Target Goals: Exercise Program Goal: Individual exercise prescription set using results from initial 6 min walk test and THRR while considering  patient's activity barriers and safety.   Exercise Prescription Goal: Initial exercise prescription builds to 30-45 minutes a day of aerobic activity, 2-3 days per week.  Home exercise guidelines will be given to patient during program as part of exercise prescription that the participant will acknowledge.  Education: Aerobic Exercise: - Group verbal and visual presentation on the components of exercise prescription. Introduces F.I.T.T principle from ACSM for exercise prescriptions.  Reviews F.I.T.T. principles of aerobic exercise including progression. Written material given at graduation.   Education:  Resistance Exercise: - Group verbal and visual presentation on the components of exercise prescription. Introduces F.I.T.T principle from ACSM for exercise prescriptions  Reviews F.I.T.T. principles of resistance exercise including progression. Written material given at graduation.    Education: Exercise & Equipment Safety: - Individual verbal instruction and demonstration of equipment use and safety with use of the equipment. Flowsheet Row Pulmonary Rehab from 01/14/2023 in [facility] Cardiac and Pulmonary Rehab  Date 01/10/23  Educator [facility]  Instruction Review Code 1- Verbalizes Understanding       Education: Exercise Physiology & General Exercise Guidelines: - Group verbal and written instruction with models to review the exercise physiology of the cardiovascular system and associated critical values. Provides general exercise guidelines with specific guidelines to those with heart or lung disease.    Education: Flexibility, Balance, Mind/Body Relaxation: - Group verbal and visual presentation with interactive activity on the components of exercise prescription. Introduces F.I.T.T principle from ACSM for exercise prescriptions. Reviews F.I.T.T. principles of flexibility and balance exercise training including progression. Also discusses the mind body connection.  Reviews various relaxation techniques to help reduce and manage stress (i.e. Deep breathing, progressive muscle relaxation, and visualization). Balance handout provided to take home. Written material given at graduation.   Activity Barriers & Risk Stratification:  Activity Barriers & Cardiac Risk Stratification - 01/14/23 1526       Activity Barriers & Cardiac Risk Stratification   Activity Barriers Left Knee Replacement;Right Knee Replacement;Balance Concerns;History of Falls;Shortness of Breath             6 Minute Walk:  6 Minute Walk     Row Name 01/14/23 1520         6 Minute Walk   Phase Initial     Distance 560  feet     Walk Time 4.08  minutes     # of Rest Breaks 1     MPH 1.56     METS 1.23     RPE 13     Perceived Dyspnea  2     VO2 Peak 4.31     Symptoms Yes (comment)     Comments SOB     Resting HR 96 bpm     Resting BP 122/62     Resting Oxygen Saturation  98 %     Exercise Oxygen Saturation  during 6 min walk 91 %     Max Ex. HR 137 bpm     Max Ex. BP 148/66     2 Minute Post BP 136/62       Interval HR   1 Minute HR 114     2 Minute HR 127     3 Minute HR 137     4 Minute HR 117     5 Minute HR 109     6 Minute HR 113     2 Minute Post HR 102     Interval Heart Rate? Yes       Interval Oxygen   Interval Oxygen? Yes     Baseline Oxygen Saturation % 98 %     1 Minute Oxygen Saturation % 91 %     1 Minute Liters of Oxygen 2 L  Pulsed     2 Minute Oxygen Saturation % 91 %     2 Minute Liters of Oxygen 2 L     3 Minute Oxygen Saturation % 92 %     3 Minute Liters of Oxygen 2 L     4 Minute Oxygen Saturation % 92 %     4 Minute Liters of Oxygen 2 L     5 Minute Oxygen Saturation % 96 %     5 Minute Liters of Oxygen 2 L     6 Minute Oxygen Saturation % 93 %     6 Minute Liters of Oxygen 2 L     2 Minute Post Oxygen Saturation % 98 %     2 Minute Post Liters of Oxygen 2 L             Oxygen Initial Assessment:  Oxygen Initial Assessment - 01/10/23 0927       Home Oxygen   Home Oxygen Device Home Concentrator;Portable Concentrator    Sleep Oxygen Prescription Continuous    Liters per minute 2    Home Exercise Oxygen Prescription Continuous    Liters per minute 2    Home Resting Oxygen Prescription Continuous    Liters per minute 2    Compliance with Home Oxygen Use Yes      Initial 6 min Walk   Oxygen Used Continuous;Portable Concentrator    Liters per minute 2      Program Oxygen Prescription   Program Oxygen Prescription Continuous    Liters per minute 2      Intervention   Short Term Goals To learn and exhibit compliance with exercise, home and  travel O2 prescription;To learn and understand importance of monitoring SPO2 with pulse oximeter and demonstrate accurate use of the pulse oximeter.;To learn and understand importance of maintaining oxygen saturations>88%;To learn and demonstrate proper pursed lip breathing techniques or other breathing techniques. ;To learn and demonstrate proper use of respiratory medications    Long  Term Goals Exhibits compliance with exercise, home  and travel O2 prescription;Verbalizes importance of monitoring SPO2 with  pulse oximeter and return demonstration;Maintenance of O2 saturations>88%;Exhibits proper breathing techniques, such as pursed lip breathing or other method taught during program session;Compliance with respiratory medication;Demonstrates proper use of MDI's             Oxygen Re-Evaluation:  Oxygen Re-Evaluation     Row Name 01/17/23 1409 02/21/23 1354 03/21/23 1601         Program Oxygen Prescription   Program Oxygen Prescription -- Continuous Continuous     Liters per minute -- 2 2       Home Oxygen   Home Oxygen Device -- Home Concentrator;Portable Concentrator Home Concentrator;Portable Concentrator     Sleep Oxygen Prescription -- Continuous Continuous     Liters per minute -- 2 2     Home Exercise Oxygen Prescription -- Continuous Continuous     Liters per minute -- 2 2  using 1 in program with good sats     Home Resting Oxygen Prescription -- Continuous Continuous     Liters per minute -- 2 2     Compliance with Home Oxygen Use -- Yes Yes       Goals/Expected Outcomes   Short Term Goals -- To learn and demonstrate proper pursed lip breathing techniques or other breathing techniques.  To learn and demonstrate proper pursed lip breathing techniques or other breathing techniques. ;To learn and exhibit compliance with exercise, home and travel O2 prescription;To learn and understand importance of monitoring SPO2 with pulse oximeter and demonstrate accurate use of the pulse  oximeter.;To learn and understand importance of maintaining oxygen saturations>88%     Long  Term Goals -- Exhibits proper breathing techniques, such as pursed lip breathing or other method taught during program session Exhibits compliance with exercise, home  and travel O2 prescription;Verbalizes importance of monitoring SPO2 with pulse oximeter and return demonstration;Maintenance of O2 saturations>88%;Exhibits proper breathing techniques, such as pursed lip breathing or other method taught during program session     Comments Reviewed PLB technique with pt.  Talked about how it works and it's importance in maintaining their exercise saturations. Informed patient how to perform the Pursed Lipped breathing technique. Told patient to Inhale through the nose and out the mouth with pursed lips to keep their airways open, help oxygenate them better, practice when at rest or doing strenuous activity. Patient Verbalizes understanding of technique and will work on and be reiterated during [facility]. Addasyn is aware of how to look at her pulse oximetry for readings, she is trying to wean her levels form 2 to 1. She has been able to keep her sats in good range at 1L/min. USes PLB as needed     Goals/Expected Outcomes Short: Become more profiecient at using PLB. Long: Become independent at using PLB. Short: use PLB with exertion. Long: use PLB on exertion proficiently and independently. STG continue with exercise progression, keeping sats above 88% LTG wean oxygen to lowest level allowed by physician during exertion              Oxygen Discharge (Final Oxygen Re-Evaluation):  Oxygen Re-Evaluation - 03/21/23 1601       Program Oxygen Prescription   Program Oxygen Prescription Continuous    Liters per minute 2      Home Oxygen   Home Oxygen Device Home Concentrator;Portable Concentrator    Sleep Oxygen Prescription Continuous    Liters per minute 2    Home Exercise Oxygen Prescription Continuous     Liters per minute 2   using 1  in program with good sats   Home Resting Oxygen Prescription Continuous    Liters per minute 2    Compliance with Home Oxygen Use Yes      Goals/Expected Outcomes   Short Term Goals To learn and demonstrate proper pursed lip breathing techniques or other breathing techniques. ;To learn and exhibit compliance with exercise, home and travel O2 prescription;To learn and understand importance of monitoring SPO2 with pulse oximeter and demonstrate accurate use of the pulse oximeter.;To learn and understand importance of maintaining oxygen saturations>88%    Long  Term Goals Exhibits compliance with exercise, home  and travel O2 prescription;Verbalizes importance of monitoring SPO2 with pulse oximeter and return demonstration;Maintenance of O2 saturations>88%;Exhibits proper breathing techniques, such as pursed lip breathing or other method taught during program session    Comments Chrystie is aware of how to look at her pulse oximetry for readings, she is trying to wean her levels form 2 to 1. She has been able to keep her sats in good range at 1L/min. USes PLB as needed    Goals/Expected Outcomes STG continue with exercise progression, keeping sats above 88% LTG wean oxygen to lowest level allowed by physician during exertion             Initial Exercise Prescription:  Initial Exercise Prescription - 01/14/23 1500       Date of Initial Exercise RX and Referring Provider   Date 01/14/23    Referring Provider Dr. Vida Rigger, MD      Oxygen   Oxygen Continuous    Liters 2    Maintain Oxygen Saturation 88% or higher      Recumbant Bike   Level 1    RPM 50    Watts 15    Minutes 15    METs 1.23      NuStep   Level 1    SPM 80    Minutes 15    METs 1.23      Track   Laps 10    Minutes 15    METs 1.54      Prescription Details   Frequency (times per week) 2    Duration Progress to 30 minutes of continuous aerobic without signs/symptoms of  physical distress      Intensity   THRR 40-80% of Max Heartrate 114-132    Ratings of Perceived Exertion 11-13    Perceived Dyspnea 0-4      Progression   Progression Continue to progress workloads to maintain intensity without signs/symptoms of physical distress.      Resistance Training   Training Prescription Yes    Weight 4 lb    Reps 10-15             Perform Capillary Blood Glucose checks as needed.  Exercise Prescription Changes:   Exercise Prescription Changes     Row Name 01/14/23 1500 01/29/23 1400 02/12/23 1400 02/26/23 0800 03/27/23 0800     Response to Exercise   Blood Pressure (Admit) 122/62 136/68 120/56 124/68 112/64   Blood Pressure (Exercise) 148/66 158/70 154/60 144/72 134/68   Blood Pressure (Exit) 136/62 122/60 136/60 122/54 104/56   Heart Rate (Admit) 96 bpm 91 bpm 87 bpm 96 bpm 90 bpm   Heart Rate (Exercise) 137 bpm 103 bpm 106 bpm 106 bpm 113 bpm   Heart Rate (Exit) 102 bpm 90 bpm 94 bpm 100 bpm 94 bpm   Oxygen Saturation (Admit) 98 % 96 % 96 % 97 % 94 %  Oxygen Saturation (Exercise) 91 % 92 % 91 % 91 % 88 %   Oxygen Saturation (Exit) 98 % 92 % 91 % 94 % 94 %   Rating of Perceived Exertion (Exercise) 13 13 12 14 13    Perceived Dyspnea (Exercise) 2 0 0 0 0   Symptoms SOB SOB SOB SOB SOB   Comments Results First two days of exercise -- -- --   Duration -- Progress to 30 minutes of  aerobic without signs/symptoms of physical distress Continue with 30 min of aerobic exercise without signs/symptoms of physical distress. Continue with 30 min of aerobic exercise without signs/symptoms of physical distress. Continue with 30 min of aerobic exercise without signs/symptoms of physical distress.   Intensity -- THRR unchanged THRR unchanged THRR unchanged THRR unchanged     Progression   Progression -- Continue to progress workloads to maintain intensity without signs/symptoms of physical distress. Continue to progress workloads to maintain intensity  without signs/symptoms of physical distress. Continue to progress workloads to maintain intensity without signs/symptoms of physical distress. Continue to progress workloads to maintain intensity without signs/symptoms of physical distress.   Average METs -- 2.35 2.13 1.83 1.98     Resistance Training   Training Prescription -- Yes Yes Yes Yes   Weight -- 4 lb 4 lb 4 lb 4 lb   Reps -- 10-15 10-15 10-15 10-15     Interval Training   Interval Training -- No No No No     Oxygen   Oxygen -- Continuous Continuous Continuous Continuous   Liters -- 2 0-2 1 1      Recumbant Bike   Level -- 2 1 -- --   Watts -- 18 18 -- --   Minutes -- 15 15 -- --   METs -- 2.6 2.6 -- --     NuStep   Level -- 1 3 2 3    Minutes -- 15 30 30 15    METs -- 2.3 2.1 -- 2.1     Arm Ergometer   Level -- -- -- 1 --   Minutes -- -- -- 15 --   METs -- -- -- 1.7 --     T5 Nustep   Level -- -- -- 1 --   Minutes -- -- -- 15 --   METs -- -- -- 2 --     Track   Laps -- -- -- -- 16   Minutes -- -- -- -- 15   METs -- -- -- -- 1.87     Oxygen   Maintain Oxygen Saturation -- 88% or higher 88% or higher 88% or higher 88% or higher    Row Name 04/09/23 1300             Response to Exercise   Blood Pressure (Admit) 118/54       Blood Pressure (Exit) 118/58       Heart Rate (Admit) 98 bpm       Heart Rate (Exercise) 110 bpm       Heart Rate (Exit) 101 bpm       Oxygen Saturation (Admit) 97 %       Oxygen Saturation (Exercise) 90 %       Oxygen Saturation (Exit) 96 %       Rating of Perceived Exertion (Exercise) 15       Perceived Dyspnea (Exercise) 2       Symptoms SOB       Duration Continue with 30 min of aerobic exercise  without signs/symptoms of physical distress.       Intensity THRR unchanged         Progression   Progression Continue to progress workloads to maintain intensity without signs/symptoms of physical distress.       Average METs 2.04         Resistance Training   Training  Prescription Yes       Weight 4 lb       Reps 10-15         Interval Training   Interval Training No         Oxygen   Oxygen Continuous       Liters 0-1         NuStep   Level 3       Minutes 15       METs 2.2         Track   Laps 22       Minutes 15       METs 2.2         Oxygen   Maintain Oxygen Saturation 88% or higher                Exercise Comments:   Exercise Comments     Row Name 01/17/23 1408           Exercise Comments First full day of exercise!  Patient was oriented to gym and equipment including functions, settings, policies, and procedures.  Patient's individual exercise prescription and treatment plan were reviewed.  All starting workloads were established based on the results of the 6 minute walk test done at initial orientation visit.  The plan for exercise progression was also introduced and progression will be customized based on patient's performance and goals.                Exercise Goals and Review:   Exercise Goals     Row Name 01/14/23 1526             Exercise Goals   Increase Physical Activity Yes       Intervention Provide advice, education, support and counseling about physical activity/exercise needs.;Develop an individualized exercise prescription for aerobic and resistive training based on initial evaluation findings, risk stratification, comorbidities and participant's personal goals.       Expected Outcomes Short Term: Attend rehab on a regular basis to increase amount of physical activity.;Long Term: Exercising regularly at least 3-5 days a week.;Long Term: Add in home exercise to make exercise part of routine and to increase amount of physical activity.       Increase Strength and Stamina Yes       Intervention Develop an individualized exercise prescription for aerobic and resistive training based on initial evaluation findings, risk stratification, comorbidities and participant's personal goals.;Provide advice,  education, support and counseling about physical activity/exercise needs.       Expected Outcomes Short Term: Increase workloads from initial exercise prescription for resistance, speed, and METs.;Short Term: Perform resistance training exercises routinely during rehab and add in resistance training at home;Long Term: Improve cardiorespiratory fitness, muscular endurance and strength as measured by increased METs and functional capacity ( )       Able to understand and use rate of perceived exertion (RPE) scale Yes       Intervention Provide education and explanation on how to use RPE scale       Expected Outcomes Short Term: Able to use RPE daily in rehab to express subjective intensity level;Long  Term:  Able to use RPE to guide intensity level when exercising independently       Able to understand and use Dyspnea scale Yes       Intervention Provide education and explanation on how to use Dyspnea scale       Expected Outcomes Short Term: Able to use Dyspnea scale daily in rehab to express subjective sense of shortness of breath during exertion;Long Term: Able to use Dyspnea scale to guide intensity level when exercising independently       Knowledge and understanding of Target Heart Rate Range (THRR) Yes       Intervention Provide education and explanation of THRR including how the numbers were predicted and where they are located for reference       Expected Outcomes Short Term: Able to state/look up THRR;Long Term: Able to use THRR to govern intensity when exercising independently;Short Term: Able to use daily as guideline for intensity in rehab       Able to check pulse independently Yes       Intervention Provide education and demonstration on how to check pulse in carotid and radial arteries.;Review the importance of being able to check your own pulse for safety during independent exercise       Expected Outcomes Short Term: Able to explain why pulse checking is important during independent  exercise;Long Term: Able to check pulse independently and accurately       Understanding of Exercise Prescription Yes       Intervention Provide education, explanation, and written materials on patient's individual exercise prescription       Expected Outcomes Short Term: Able to explain program exercise prescription;Long Term: Able to explain home exercise prescription to exercise independently                Exercise Goals Re-Evaluation :  Exercise Goals Re-Evaluation     Row Name 01/17/23 1408 01/29/23 1454 02/12/23 1444 02/26/23 0810 03/27/23 0827     Exercise Goal Re-Evaluation   Exercise Goals Review Able to understand and use rate of perceived exertion (RPE) scale;Able to understand and use Dyspnea scale;Knowledge and understanding of Target Heart Rate Range (THRR);Understanding of Exercise Prescription Increase Physical Activity;Increase Strength and Stamina;Understanding of Exercise Prescription Increase Physical Activity;Increase Strength and Stamina;Understanding of Exercise Prescription Increase Physical Activity;Increase Strength and Stamina;Understanding of Exercise Prescription Increase Physical Activity;Increase Strength and Stamina;Understanding of Exercise Prescription   Comments Reviewed RPE and dyspnea scale, THR and program prescription with pt today.  Pt voiced understanding and was given a copy of goals to take home. Dondra Spry is off to a good start in rehab. She has done well on the T4 nustep at level 1 and improved to level 2 on the recumbent bike. She also has done well with 4 lb hand weights for resistance training. She has maintained her O2 saturations above 92% during exercise as well. We will continue to monitor her progress in the program. Dondra Spry is doing well in the program. She continues to do well on the T4 nustep and improved to level 3. She also has done well on the recumbent bike at level 1 and continues to use 4 lb hand weights for resistance training. She has not  done any walking in the program up to this point. We will continue to monitor her progress in the program. Dondra Spry is doing well in the program. She began using the arm crank and T5 nustep and did well at level 1 on each. She has not  done any walking in the program up to this point, but we will encourage her to begin doing so. We will continue to monitor her progresss in the program. Dondra Spry continues to do well in the program. She was able to increase her level on the T4 nustep from level 2 to 3. She was also able to walk 16 laps on the track in 15 minutes. We will continue to encourage and monitor her progress in the program.   Expected Outcomes Short: Use RPE daily to regulate intensity. Long: Follow program prescription in THR. Short: Continue to follow current exercise prescription. Long: Continue exercise to improve strength and stamina. Short: Begin walking the track in rehab. Long: Continue exercise to improve strength and stamina. Short: Begin walking the track in rehab. Long: Continue exercise to improve strength and stamina. Short: Continue to walk the track and push for more laps. Long: Continue exercise to improvre strength and stamina.    Row Name 04/09/23 1350             Exercise Goal Re-Evaluation   Exercise Goals Review Increase Physical Activity;Increase Strength and Stamina;Understanding of Exercise Prescription       Comments Gial is doing well in the program. She continues to do well at level 3 on the T4 nustep. She also increased her walking distance up to 22 laps on the track. She has also been exercising on room air at times and has done well, as her oxygen saturation did not drop below 90% since the last review. We will continue to monitor her progress in the program.       Expected Outcomes Short: Continue to walk the track and push for more laps. Long: Continue exercise to improvre strength and stamina.                Discharge Exercise Prescription (Final Exercise  Prescription Changes):  Exercise Prescription Changes - 04/09/23 1300       Response to Exercise   Blood Pressure (Admit) 118/54    Blood Pressure (Exit) 118/58    Heart Rate (Admit) 98 bpm    Heart Rate (Exercise) 110 bpm    Heart Rate (Exit) 101 bpm    Oxygen Saturation (Admit) 97 %    Oxygen Saturation (Exercise) 90 %    Oxygen Saturation (Exit) 96 %    Rating of Perceived Exertion (Exercise) 15    Perceived Dyspnea (Exercise) 2    Symptoms SOB    Duration Continue with 30 min of aerobic exercise without signs/symptoms of physical distress.    Intensity THRR unchanged      Progression   Progression Continue to progress workloads to maintain intensity without signs/symptoms of physical distress.    Average METs 2.04      Resistance Training   Training Prescription Yes    Weight 4 lb    Reps 10-15      Interval Training   Interval Training No      Oxygen   Oxygen Continuous    Liters 0-1      NuStep   Level 3    Minutes 15    METs 2.2      Track   Laps 22    Minutes 15    METs 2.2      Oxygen   Maintain Oxygen Saturation 88% or higher             Nutrition:  Target Goals: Understanding of nutrition guidelines, daily intake of sodium 1500mg , cholesterol 200mg ,  calories 30% from fat and 7% or less from saturated fats, daily to have 5 or more servings of fruits and vegetables.  Education: All About Nutrition: -Group instruction provided by verbal, written material, interactive activities, discussions, models, and posters to present general guidelines for heart healthy nutrition including fat, fiber, MyPlate, the role of sodium in heart healthy nutrition, utilization of the nutrition label, and utilization of this knowledge for meal planning. Follow up email sent as well. Written material given at graduation.   Biometrics:  Pre Biometrics - 01/14/23 1527       Pre Biometrics   Height 5' 5.5" (1.664 m)    Weight 211 lb (95.7 kg)    Waist Circumference  43.5 inches    Hip Circumference 50 inches    Waist to Hip Ratio 0.87 %    BMI (Calculated) 34.57    Single Leg Stand 2.85 seconds              Nutrition Therapy Plan and Nutrition Goals:  Nutrition Therapy & Goals - 01/14/23 1514       Nutrition Therapy   RD appointment deferred Yes      Intervention Plan   Intervention Prescribe, educate and counsel regarding individualized specific dietary modifications aiming towards targeted core components such as weight, hypertension, lipid management, diabetes, heart failure and other comorbidities.    Expected Outcomes Short Term Goal: Understand basic principles of dietary content, such as calories, fat, sodium, cholesterol and nutrients.;Short Term Goal: A plan has been developed with personal nutrition goals set during dietitian appointment.;Long Term Goal: Adherence to prescribed nutrition plan.             Nutrition Assessments:  MEDIFICTS Score Key: >=70 Need to make dietary changes  40-70 Heart Healthy Diet <= 40 Therapeutic Level Cholesterol Diet  Flowsheet Row Pulmonary Rehab from 01/14/2023 in [facility] Cardiac and Pulmonary Rehab  Picture Your Plate Total Score on Admission 46      Picture Your Plate Scores: <16 Unhealthy dietary pattern with much room for improvement. 41-50 Dietary pattern unlikely to meet recommendations for good health and room for improvement. 51-60 More healthful dietary pattern, with some room for improvement.  >60 Healthy dietary pattern, although there may be some specific behaviors that could be improved.   Nutrition Goals Re-Evaluation:  Nutrition Goals Re-Evaluation     Row Name 02/12/23 0931 02/21/23 1353 03/21/23 1611         Goals   Current Weight 208 lb 11.2 oz (94.7 kg) -- --     Nutrition Goal RD appointment deferred -- RD appointment deferred     Comment -- Patient was informed on why it is important to maintain a balanced diet when dealing with Respiratory issues. Explained  that it takes a lot of energy to breath and when they are short of breath often they will need to have a good diet to help keep up with the calories they are expending for breathing. COntinues to defer RD visit.     Expected Outcome -- Short: Choose and plan snacks accordingly to patients caloric intake to improve breathing. Long: Maintain a diet independently that meets their caloric intake to aid in daily shortness of breath. --              Nutrition Goals Discharge (Final Nutrition Goals Re-Evaluation):  Nutrition Goals Re-Evaluation - 03/21/23 1611       Goals   Nutrition Goal RD appointment deferred    Comment COntinues to defer RD  visit.             Psychosocial: Target Goals: Acknowledge presence or absence of significant depression and/or stress, maximize coping skills, provide positive support system. Participant is able to verbalize types and ability to use techniques and skills needed for reducing stress and depression.   Education: Stress, Anxiety, and Depression - Group verbal and visual presentation to define topics covered.  Reviews how body is impacted by stress, anxiety, and depression.  Also discusses healthy ways to reduce stress and to treat/manage anxiety and depression.  Written material given at graduation.   Education: Sleep Hygiene -Provides group verbal and written instruction about how sleep can affect your health.  Define sleep hygiene, discuss sleep cycles and impact of sleep habits. Review good sleep hygiene tips.    Initial Review & Psychosocial Screening:  Initial Psych Review & Screening - 01/10/23 0931       Initial Review   Current issues with Current Psychotropic Meds;Current Depression;History of Depression      Family Dynamics   Good Support System? Yes    Comments Dondra Spry has a lot of friends, chruch and family that support her. She does take medication for depression. She is ready to get started in the program since her last bout with  Covid she has been more short of breath.      Barriers   Psychosocial barriers to participate in program The patient should benefit from training in stress management and relaxation.      Screening Interventions   Interventions Encouraged to exercise;To provide support and resources with identified psychosocial needs;Provide feedback about the scores to participant    Expected Outcomes Short Term goal: Utilizing psychosocial counselor, staff and physician to assist with identification of specific Stressors or current issues interfering with healing process. Setting desired goal for each stressor or current issue identified.;Long Term Goal: Stressors or current issues are controlled or eliminated.;Short Term goal: Identification and review with participant of any Quality of Life or Depression concerns found by scoring the questionnaire.;Long Term goal: The participant improves quality of Life and PHQ9 Scores as seen by post scores and/or verbalization of changes             Quality of Life Scores:  Scores of 19 and below usually indicate a poorer quality of life in these areas.  A difference of  2-3 points is a clinically meaningful difference.  A difference of 2-3 points in the total score of the Quality of Life Index has been associated with significant improvement in overall quality of life, self-image, physical symptoms, and general health in studies assessing change in quality of life.  PHQ-9: Review Flowsheet       01/14/2023  Depression screen PHQ 2/9  Decreased Interest 0  Down, Depressed, Hopeless 0  PHQ - 2 Score 0  Altered sleeping 2  Tired, decreased energy 0  Change in appetite 0  Feeling bad or failure about yourself  0  Trouble concentrating 1  Moving slowly or fidgety/restless 0  Suicidal thoughts 1  PHQ-9 Score 4  Difficult doing work/chores Somewhat difficult   Interpretation of Total Score  Total Score Depression Severity:  1-4 = Minimal depression, 5-9 =  Mild depression, 10-14 = Moderate depression, 15-19 = Moderately severe depression, 20-27 = Severe depression   Psychosocial Evaluation and Intervention:  Psychosocial Evaluation - 01/10/23 0933       Psychosocial Evaluation & Interventions   Interventions Encouraged to exercise with the program and follow exercise prescription;Stress  management education;Relaxation education    Comments Dondra Spry has a lot of friends, chruch and family that support her. She does take medication for depression. She is ready to get started in the program since her last bout with Covid she has been more short of breath.    Expected Outcomes Short: Start [facility] to help with mood. Long: Maintain a healthy mental state.    Continue Psychosocial Services  Follow up required by staff             Psychosocial Re-Evaluation:  Psychosocial Re-Evaluation     Row Name 02/12/23 0926 02/21/23 1356 03/21/23 1605         Psychosocial Re-Evaluation   Current issues with None Identified None Identified None Identified     Comments Dondra Spry states that she has no current stressors, and states that she is getting good sleep. Her grandson, son and daughter make up her support system, and she can reach out to them when she is dealing with stress. Patient reports no issues with their current mental states, sleep, stress, depression or anxiety. Will follow up with patient in a few weeks for any changes. Continues tkaing meds to prevent depression symptoms, states it helps her not get angry and yell at others. She is wotking on lowering her oxygen needs during exercise and is also working on increasing workloads a tolerated. She is happy to see the changes since she started the program     Expected Outcomes Short: Continue to attend [facility] regularly for regular exercise and social engagement. Long: Continue to improve symptoms and manage a positive mental state. Short: Continue to exercise regularly to support mental health and  notify staff of any changes. Long: maintain mental health and well being through teaching of rehab or prescribed medications independently. STG continue with exercise progression and working on weaning her oxygen, continue to take meds as prescribed.     Interventions Encouraged to attend Pulmonary Rehabilitation for the exercise Encouraged to attend Pulmonary Rehabilitation for the exercise Encouraged to attend Pulmonary Rehabilitation for the exercise     Continue Psychosocial Services  Follow up required by staff Follow up required by staff Follow up required by staff              Psychosocial Discharge (Final Psychosocial Re-Evaluation):  Psychosocial Re-Evaluation - 03/21/23 1605       Psychosocial Re-Evaluation   Current issues with None Identified    Comments Continues tkaing meds to prevent depression symptoms, states it helps her not get angry and yell at others. She is wotking on lowering her oxygen needs during exercise and is also working on increasing workloads a tolerated. She is happy to see the changes since she started the program    Expected Outcomes STG continue with exercise progression and working on weaning her oxygen, continue to take meds as prescribed.    Interventions Encouraged to attend Pulmonary Rehabilitation for the exercise    Continue Psychosocial Services  Follow up required by staff             Education: Education Goals: Education classes will be provided on a weekly basis, covering required topics. Participant will state understanding/return demonstration of topics presented.  Learning Barriers/Preferences:  Learning Barriers/Preferences - 01/10/23 0930       Learning Barriers/Preferences   Learning Barriers Sight    Learning Preferences None             General Pulmonary Education Topics:  Infection Prevention: - Provides verbal and written  material to individual with discussion of infection control including proper hand washing and  proper equipment cleaning during exercise session. Flowsheet Row Pulmonary Rehab from 01/14/2023 in [facility] Cardiac and Pulmonary Rehab  Date 01/10/23  Educator [facility]  Instruction Review Code 1- Verbalizes Understanding       Falls Prevention: - Provides verbal and written material to individual with discussion of falls prevention and safety. Flowsheet Row Pulmonary Rehab from 01/14/2023 in [facility] Cardiac and Pulmonary Rehab  Date 01/10/23  Educator [facility]  Instruction Review Code 1- Verbalizes Understanding       Chronic Lung Disease Review: - Group verbal instruction with posters, models, PowerPoint presentations and videos,  to review new updates, new respiratory medications, new advancements in procedures and treatments. Providing information on websites and "800" numbers for continued self-education. Includes information about supplement oxygen, available portable oxygen systems, continuous and intermittent flow rates, oxygen safety, concentrators, and Medicare reimbursement for oxygen. Explanation of Pulmonary Drugs, including class, frequency, complications, importance of spacers, rinsing mouth after steroid MDI's, and proper cleaning methods for nebulizers. Review of basic lung anatomy and physiology related to function, structure, and complications of lung disease. Review of risk factors. Discussion about methods for diagnosing sleep apnea and types of masks and machines for OSA. Includes a review of the use of types of environmental controls: home humidity, furnaces, filters, dust mite/pet prevention, HEPA vacuums. Discussion about weather changes, air quality and the benefits of nasal washing. Instruction on Warning signs, infection symptoms, calling MD promptly, preventive modes, and value of vaccinations. Review of effective airway clearance, coughing and/or vibration techniques. Emphasizing that all should Create an Action Plan. Written material given at graduation. Flowsheet Row Pulmonary  Rehab from 01/14/2023 in [facility] Cardiac and Pulmonary Rehab  Education need identified 01/14/23       AED/CPR: - Group verbal and written instruction with the use of models to demonstrate the basic use of the AED with the basic ABC's of resuscitation.    Anatomy and Cardiac Procedures: - Group verbal and visual presentation and models provide information about basic cardiac anatomy and function. Reviews the testing methods done to diagnose heart disease and the outcomes of the test results. Describes the treatment choices: Medical Management, Angioplasty, or Coronary Bypass Surgery for treating various heart conditions including Myocardial Infarction, Angina, Valve Disease, and Cardiac Arrhythmias.  Written material given at graduation.   Medication Safety: - Group verbal and visual instruction to review commonly prescribed medications for heart and lung disease. Reviews the medication, class of the drug, and side effects. Includes the steps to properly store meds and maintain the prescription regimen.  Written material given at graduation.   Other: -Provides group and verbal instruction on various topics (see comments)   Knowledge Questionnaire Score:  Knowledge Questionnaire Score - 01/14/23 1516       Knowledge Questionnaire Score   Pre Score 15/18              Core Components/Risk Factors/Patient Goals at Admission:  Personal Goals and Risk Factors at Admission - 01/10/23 0930       Core Components/Risk Factors/Patient Goals on Admission    Weight Management Yes;Weight Maintenance    Intervention Weight Management: Develop a combined nutrition and exercise program designed to reach desired caloric intake, while maintaining appropriate intake of nutrient and fiber, sodium and fats, and appropriate energy expenditure required for the weight goal.;Weight Management: Provide education and appropriate resources to help participant work on and attain dietary goals.;Weight  Management/Obesity: Establish reasonable  short term and long term weight goals.    Expected Outcomes Short Term: Continue to assess and modify interventions until short term weight is achieved;Long Term: Adherence to nutrition and physical activity/exercise program aimed toward attainment of established weight goal;Weight Maintenance: Understanding of the daily nutrition guidelines, which includes 25-35% calories from fat, 7% or less cal from saturated fats, less than 200mg  cholesterol, less than 1.5gm of sodium, & 5 or more servings of fruits and vegetables daily;Understanding recommendations for meals to include 15-35% energy as protein, 25-35% energy from fat, 35-60% energy from carbohydrates, less than 200mg  of dietary cholesterol, 20-35 gm of total fiber daily;Understanding of distribution of calorie intake throughout the day with the consumption of 4-5 meals/snacks    Improve shortness of breath with ADL's Yes    Intervention Provide education, individualized exercise plan and daily activity instruction to help decrease symptoms of SOB with activities of daily living.    Expected Outcomes Short Term: Improve cardiorespiratory fitness to achieve a reduction of symptoms when performing ADLs;Long Term: Be able to perform more ADLs without symptoms or delay the onset of symptoms    Diabetes Yes    Intervention Provide education about signs/symptoms and action to take for hypo/hyperglycemia.;Provide education about proper nutrition, including hydration, and aerobic/resistive exercise prescription along with prescribed medications to achieve blood glucose in normal ranges: Fasting glucose 65-99 mg/dL    Expected Outcomes Short Term: Participant verbalizes understanding of the signs/symptoms and immediate care of hyper/hypoglycemia, proper foot care and importance of medication, aerobic/resistive exercise and nutrition plan for blood glucose control.;Long Term: Attainment of HbA1C < 7%.    Hypertension Yes     Intervention Provide education on lifestyle modifcations including regular physical activity/exercise, weight management, moderate sodium restriction and increased consumption of fresh fruit, vegetables, and low fat dairy, alcohol moderation, and smoking cessation.;Monitor prescription use compliance.    Expected Outcomes Short Term: Continued assessment and intervention until BP is < 140/43mm HG in hypertensive participants. < 130/69mm HG in hypertensive participants with diabetes, heart failure or chronic kidney disease.;Long Term: Maintenance of blood pressure at goal levels.             Education:Diabetes - Individual verbal and written instruction to review signs/symptoms of diabetes, desired ranges of glucose level fasting, after meals and with exercise. Acknowledge that pre and post exercise glucose checks will be done for 3 sessions at entry of program. Flowsheet Row Pulmonary Rehab from 01/14/2023 in [facility] Cardiac and Pulmonary Rehab  Date 01/10/23  Educator [facility]  Instruction Review Code 1- Verbalizes Understanding       Know Your Numbers and Heart Failure: - Group verbal and visual instruction to discuss disease risk factors for cardiac and pulmonary disease and treatment options.  Reviews associated critical values for Overweight/Obesity, Hypertension, Cholesterol, and Diabetes.  Discusses basics of heart failure: signs/symptoms and treatments.  Introduces Heart Failure Zone chart for action plan for heart failure.  Written material given at graduation. Flowsheet Row Pulmonary Rehab from 01/14/2023 in [facility] Cardiac and Pulmonary Rehab  Education need identified 01/14/23       Core Components/Risk Factors/Patient Goals Review:   Goals and Risk Factor Review     Row Name 02/12/23 0932 02/21/23 1352 03/21/23 1612         Core Components/Risk Factors/Patient Goals Review   Personal Goals Review Hypertension Improve shortness of breath with ADL's Improve shortness of breath  with ADL's;Diabetes;Hypertension     Review Dondra Spry is taking her blood pressure every other day.  She was taking it every day twice a day when she was prescribed new medication but has since gotten a better grasp on her blood pressure medication. Spoke to patient about their shortness of breath and what they can do to improve. Patient has been informed of breathing techniques when starting the program. Patient is informed to tell staff if they have had any med changes and that certain meds they are taking or not taking can be causing shortness of breath. Is seeing improvement od shortness of breath symptoms with ADLS. Able to go longerwith less shortness off breath.  Gets stopped by her daughter because her HR is elevated to 120's.  DIabetes  A1c in hte 5-6 % range and her blood pressure has been good, still needs to be lower to be in goal range.  122-136/50-60 at cooldown.  She is compliant with all meds.  She has been weaning her oxygen with exertion and has been able to manage with  1 /L min with good sat results. She is pleased to be at this new level.     Expected Outcomes Short: Continue to take blood pressure regularly. Long: Continue to monitor blood pressure to deal with hypertension. Short: Attend [facility] regularly to improve shortness of breath with ADL's. Long: maintain independence with ADL's STG continue to work on BP results, take meds as prescribed and continue with exercise progression.  LTG all risk factor numbers are in goal range with her nutriton, meds and exercise management              Core Components/Risk Factors/Patient Goals at Discharge (Final Review):   Goals and Risk Factor Review - 03/21/23 1612       Core Components/Risk Factors/Patient Goals Review   Personal Goals Review Improve shortness of breath with ADL's;Diabetes;Hypertension    Review Is seeing improvement od shortness of breath symptoms with ADLS. Able to go longerwith less shortness off breath.  Gets stopped  by her daughter because her HR is elevated to 120's.  DIabetes  A1c in hte 5-6 % range and her blood pressure has been good, still needs to be lower to be in goal range.  122-136/50-60 at cooldown.  She is compliant with all meds.  She has been weaning her oxygen with exertion and has been able to manage with  1 /L min with good sat results. She is pleased to be at this new level.    Expected Outcomes STG continue to work on BP results, take meds as prescribed and continue with exercise progression.  LTG all risk factor numbers are in goal range with her nutriton, meds and exercise management             ITP Comments:  ITP Comments     Row Name 01/10/23 0926 01/14/23 1510 01/16/23 0948 01/17/23 1401 02/13/23 1151   ITP Comments Virtual Visit completed. Patient informed on EP and RD appointment and 6 Minute walk test. Patient also informed of patient health questionnaires on My Chart. Patient Verbalizes understanding. Visit diagnosis can be found in [facility] 01/12/2023. Completed and gym orientation. Initial ITP created and sent for review to Dr. Jinny Sanders, Medical Director. 30 Day review completed. Medical Director ITP review done, changes made as directed, and signed approval by Medical Director.    new to program First full day of exercise!  Patient was oriented to gym and equipment including functions, settings, policies, and procedures.  Patient's individual exercise prescription and treatment plan were reviewed.  All starting  workloads were established based on the results of the 6 minute walk test done at initial orientation visit.  The plan for exercise progression was also introduced and progression will be customized based on patient's performance and goals. 30 Day review completed. Medical Director ITP review done, changes made as directed, and signed approval by Medical Director.    new to program    Row Name 03/13/23 1303 04/10/23 0829         ITP Comments 30 Day review completed.  Medical Director ITP review done, changes made as directed, and signed approval by Medical Director.    new to program 30 Day review completed. Medical Director ITP review done, changes made as directed, and signed approval by Medical Director.               Comments: 30 day review

## 2023-04-11 ENCOUNTER — Encounter: Payer: 59 | Admitting: *Deleted

## 2023-04-11 DIAGNOSIS — U099 Post covid-19 condition, unspecified: Secondary | ICD-10-CM

## 2023-04-11 DIAGNOSIS — R0609 Other forms of dyspnea: Secondary | ICD-10-CM | POA: Diagnosis not present

## 2023-04-11 NOTE — Progress Notes (Signed)
Daily Session Note  Patient Details  Name: Rebecca Kirby MRN: [id number] Date of Birth: April 11, 1945 Referring Provider:   Flowsheet Row Pulmonary Rehab from 01/14/2023 in [facility] Cardiac and Pulmonary Rehab  Referring Provider Dr. Vida Rigger, MD       Encounter Date: 04/11/2023  Check In:  Session Check In - 04/11/23 1354       Check-In   Supervising physician immediately available to respond to emergencies See telemetry face sheet for immediately available ER MD    Location [facility]-Cardiac & Pulmonary Rehab    Staff Present Maxon Suzzette Righter, Exercise Physiologist;Aneesah Hernan Jewel Baize RN,BSN;Joseph Hood RCP,RRT,BSRT;Noah Tickle, [place], Exercise Physiologist    Virtual Visit No    Medication changes reported     No    Fall or balance concerns reported    No    Warm-up and Cool-down Performed on first and last piece of equipment    Resistance Training Performed Yes    VAD Patient? No    PAD/SET Patient? No      Pain Assessment   Currently in Pain? No/denies                Social History   Tobacco Use  Smoking Status Former   Current packs/day: 0.00   Types: Cigarettes   Quit date: 07/29/1994   Years since quitting: 28.7  Smokeless Tobacco Never    Goals Met:  Independence with exercise equipment Exercise tolerated well No report of concerns or symptoms today Strength training completed today  Goals Unmet:  Not Applicable  Comments: Pt able to follow exercise prescription today without complaint.  Will continue to monitor for progression.    Dr. Bethann Punches is Medical Director for [facility] Cardiac Rehabilitation.  Dr. Vida Rigger is Medical Director for [facility] Pulmonary Rehabilitation.

## 2023-04-16 ENCOUNTER — Encounter: Payer: 59 | Admitting: *Deleted

## 2023-04-16 DIAGNOSIS — R0609 Other forms of dyspnea: Secondary | ICD-10-CM

## 2023-04-16 NOTE — Progress Notes (Signed)
Daily Session Note  Patient Details  Name: Rebecca Kirby MRN: [id number] Date of Birth: [birth year] Referring Provider:   Flowsheet Row Pulmonary Rehab from 01/14/2023 in [facility] Cardiac and Pulmonary Rehab  Referring Provider Dr. Vida Rigger, MD       Encounter Date: 04/16/2023  Check In:  Session Check In - 04/16/23 0928       Check-In   Supervising physician immediately available to respond to emergencies See telemetry face sheet for immediately available ER MD    Location [facility]-Cardiac & Pulmonary Rehab    Staff Present Cora Collum, RN, BSN, CCRP;Noah Tickle, BS, Exercise Physiologist;Maxon Conetta BS, Exercise Physiologist;Margaret Best, MS, Exercise Physiologist    Virtual Visit No    Medication changes reported     No    Fall or balance concerns reported    No    Warm-up and Cool-down Performed on first and last piece of equipment    Resistance Training Performed Yes    VAD Patient? No    PAD/SET Patient? No      Pain Assessment   Currently in Pain? No/denies                Social History   Tobacco Use  Smoking Status Former   Current packs/day: 0.00   Types: Cigarettes   Quit date: 07/29/1994   Years since quitting: 28.7  Smokeless Tobacco Never    Goals Met:  Proper associated with RPD/PD & O2 Sat Independence with exercise equipment Exercise tolerated well No report of concerns or symptoms today  Goals Unmet:  Not Applicable  Comments: Pt able to follow exercise prescription today without complaint.  Will continue to monitor for progression.    Dr. Bethann Punches is Medical Director for [facility] Cardiac Rehabilitation.  Dr. Vida Rigger is Medical Director for [facility] Pulmonary Rehabilitation.

## 2023-04-23 ENCOUNTER — Encounter: Payer: 59 | Admitting: *Deleted

## 2023-04-23 DIAGNOSIS — R0609 Other forms of dyspnea: Secondary | ICD-10-CM | POA: Diagnosis not present

## 2023-04-23 DIAGNOSIS — U099 Post covid-19 condition, unspecified: Secondary | ICD-10-CM

## 2023-04-23 NOTE — Progress Notes (Signed)
 Daily Session Note  Patient Details  Name: Rebecca Kirby MRN: [id number] Date of Birth: 05-22-1945 Referring Provider:   Flowsheet Row Pulmonary Rehab from 01/14/2023 in [facility] Cardiac and Pulmonary Rehab  Referring Provider Dr. Vida Rigger, MD       Encounter Date: 04/23/2023  Check In:  Session Check In - 04/23/23 0939       Check-In   Supervising physician immediately available to respond to emergencies See telemetry face sheet for immediately available ER MD    Location [facility]-Cardiac & Pulmonary Rehab    Staff Present Rory Percy, MS, Exercise Physiologist;Maxon Conetta BS, Exercise Physiologist;Shanti Agresti, RN, BSN, CCRP    Virtual Visit No    Medication changes reported     No    Fall or balance concerns reported    No    Warm-up and Cool-down Performed on first and last piece of equipment    Resistance Training Performed Yes    VAD Patient? No    PAD/SET Patient? No      Pain Assessment   Currently in Pain? No/denies                Social History   Tobacco Use  Smoking Status Former   Current packs/day: 0.00   Types: Cigarettes   Quit date: 07/29/1994   Years since quitting: 28.7  Smokeless Tobacco Never    Goals Met:  Proper associated with RPD/PD & O2 Sat Independence with exercise equipment Exercise tolerated well No report of concerns or symptoms today  Goals Unmet:  Not Applicable  Comments: Pt able to follow exercise prescription today without complaint.  Will continue to monitor for progression.    Dr. Bethann Punches is Medical Director for [facility] Cardiac Rehabilitation.  Dr. Vida Rigger is Medical Director for [facility] Pulmonary Rehabilitation.

## 2023-04-25 ENCOUNTER — Encounter: Payer: 59 | Admitting: *Deleted

## 2023-04-25 DIAGNOSIS — R0609 Other forms of dyspnea: Secondary | ICD-10-CM

## 2023-04-25 NOTE — Progress Notes (Signed)
 Daily Session Note  Patient Details  Name: Rebecca Kirby MRN: [id number] Date of Birth: 1945-11-28 Referring Provider:   Flowsheet Row Pulmonary Rehab from 01/14/2023 in [facility] Cardiac and Pulmonary Rehab  Referring Provider Dr. Vida Rigger, MD       Encounter Date: 04/25/2023  Check In:  Session Check In - 04/25/23 1406       Check-In   Supervising physician immediately available to respond to emergencies See telemetry face sheet for immediately available ER MD    Location [facility]-Cardiac & Pulmonary Rehab    Staff Present Susann Givens RN,BSN;Joseph Weyman Pedro, [place], Exercise Physiologist    Virtual Visit No    Medication changes reported     No    Fall or balance concerns reported    No    Warm-up and Cool-down Performed on first and last piece of equipment    Resistance Training Performed Yes    VAD Patient? No    PAD/SET Patient? No      Pain Assessment   Currently in Pain? No/denies                Social History   Tobacco Use  Smoking Status Former   Current packs/day: 0.00   Types: Cigarettes   Quit date: 07/29/1994   Years since quitting: 28.7  Smokeless Tobacco Never    Goals Met:  Independence with exercise equipment Exercise tolerated well No report of concerns or symptoms today Strength training completed today  Goals Unmet:  Not Applicable  Comments: Pt able to follow exercise prescription today without complaint.  Will continue to monitor for progression.    Dr. Bethann Punches is Medical Director for [facility] Cardiac Rehabilitation.  Dr. Vida Rigger is Medical Director for [facility] Pulmonary Rehabilitation.

## 2023-05-02 ENCOUNTER — Encounter: Payer: 59 | Attending: Pulmonary Disease | Admitting: *Deleted

## 2023-05-02 DIAGNOSIS — R0609 Other forms of dyspnea: Secondary | ICD-10-CM | POA: Insufficient documentation

## 2023-05-02 DIAGNOSIS — U099 Post covid-19 condition, unspecified: Secondary | ICD-10-CM | POA: Diagnosis present

## 2023-05-02 NOTE — Progress Notes (Signed)
 Daily Session Note  Patient Details  Name: Rebecca Kirby MRN: [id number] Date of Birth: 1946/01/20 Referring Provider:   Flowsheet Row Pulmonary Rehab from 01/14/2023 in [facility] Cardiac and Pulmonary Rehab  Referring Provider Dr. Vida Rigger, MD       Encounter Date: 05/02/2023  Check In:  Session Check In - 05/02/23 0944       Check-In   Supervising physician immediately available to respond to emergencies See telemetry face sheet for immediately available ER MD    Location [facility]-Cardiac & Pulmonary Rehab    Staff Present Cora Collum, RN, BSN, CCRP;Margaret Best, MS, Exercise Physiologist;Joseph Reino Kent RCP,RRT,BSRT    Virtual Visit No    Medication changes reported     No    Fall or balance concerns reported    No    Warm-up and Cool-down Performed on first and last piece of equipment    Resistance Training Performed Yes    VAD Patient? No    PAD/SET Patient? No      Pain Assessment   Currently in Pain? No/denies                Social History   Tobacco Use  Smoking Status Former   Current packs/day: 0.00   Types: Cigarettes   Quit date: 07/29/1994   Years since quitting: 28.7  Smokeless Tobacco Never    Goals Met:  Proper associated with RPD/PD & O2 Sat Independence with exercise equipment Exercise tolerated well No report of concerns or symptoms today  Goals Unmet:  Not Applicable  Comments: Pt able to follow exercise prescription today without complaint.  Will continue to monitor for progression.    Dr. Bethann Punches is Medical Director for [facility] Cardiac Rehabilitation.  Dr. Vida Rigger is Medical Director for [facility] Pulmonary Rehabilitation.

## 2023-05-07 ENCOUNTER — Encounter: Payer: 59 | Admitting: *Deleted

## 2023-05-07 DIAGNOSIS — R0609 Other forms of dyspnea: Secondary | ICD-10-CM | POA: Diagnosis not present

## 2023-05-07 DIAGNOSIS — U099 Post covid-19 condition, unspecified: Secondary | ICD-10-CM

## 2023-05-07 NOTE — Progress Notes (Signed)
 Daily Session Note  Patient Details  Name: Rebecca Kirby MRN: [id number] Date of Birth: April 23, 1945 Referring Provider:   Flowsheet Row Pulmonary Rehab from 01/14/2023 in [facility] Cardiac and Pulmonary Rehab  Referring Provider Dr. Vida Rigger, MD       Encounter Date: 05/07/2023  Check In:  Session Check In - 05/07/23 0932       Check-In   Supervising physician immediately available to respond to emergencies See telemetry face sheet for immediately available ER MD    Location [facility]-Cardiac & Pulmonary Rehab    Staff Present Cora Collum, RN, BSN, CCRP;Laureen Manson Passey, BS, RRT, CPFT;Margaret Best, MS, Exercise Physiologist    Virtual Visit No    Medication changes reported     No    Fall or balance concerns reported    No    Warm-up and Cool-down Performed on first and last piece of equipment    Resistance Training Performed Yes    VAD Patient? No    PAD/SET Patient? No      Pain Assessment   Currently in Pain? No/denies                Social History   Tobacco Use  Smoking Status Former   Current packs/day: 0.00   Types: Cigarettes   Quit date: 07/29/1994   Years since quitting: 28.7  Smokeless Tobacco Never    Goals Met:  Proper associated with RPD/PD & O2 Sat Independence with exercise equipment Exercise tolerated well No report of concerns or symptoms today  Goals Unmet:  Not Applicable  Comments: Pt able to follow exercise prescription today without complaint.  Will continue to monitor for progression.    Dr. Bethann Punches is Medical Director for [facility] Cardiac Rehabilitation.  Dr. Vida Rigger is Medical Director for [facility] Pulmonary Rehabilitation.

## 2023-05-08 DIAGNOSIS — U099 Post covid-19 condition, unspecified: Secondary | ICD-10-CM

## 2023-05-08 NOTE — Progress Notes (Signed)
 Pulmonary Individual Treatment Plan  Patient Details  Name: Rebecca Kirby MRN: [id number] Date of Birth: 1945-08-24 Referring Provider:   Flowsheet Row Pulmonary Rehab from 01/14/2023 in [facility] Pulmonary Rehab  Referring Provider Dr. Vida Rigger, MD       Initial Encounter Date:  Flowsheet Row Pulmonary Rehab from 01/14/2023 in [facility] Cardiac and Pulmonary Rehab  Date 01/14/23       Visit Diagnosis: Post-COVID chronic dyspnea  Patient's Home Medications on Admission:  Current Outpatient Medications:    Accu-Chek FastClix Lancets MISC, USE 1 EACH FOUR TIMES DAILY. USE AS INSTRUCTED, Disp: , Rfl:    acetaminophen (TYLENOL) 500 MG tablet, Take 500-1,000 mg by mouth 2 (two) times daily., Disp: , Rfl:    acetaminophen (TYLENOL) 500 MG tablet, Take by mouth., Disp: , Rfl:    albuterol (VENTOLIN HFA) 108 (90 Base) MCG/ACT inhaler, Inhale 1 puff into the lungs every 6 (six) hours as needed for wheezing or shortness of breath., Disp: 18 g, Rfl: 0   ALPRAZolam (XANAX) 0.25 MG tablet, Take by mouth., Disp: , Rfl:    B-D UF III MINI PEN NEEDLES 31G X 5 MM MISC, 4 (four) times daily. as directed, Disp: , Rfl:    benzonatate (TESSALON) 200 MG capsule, , Disp: , Rfl:    calcium acetate (PHOSLO) 667 MG capsule, Take 1 capsule by mouth 2 (two) times daily., Disp: , Rfl:    cephALEXin (KEFLEX) 250 MG capsule, , Disp: , Rfl:    chlorpheniramine-HYDROcodone (TUSSIONEX) 10-8 MG/5ML, SHAKE LIQUID AND TAKE 5 ML BY MOUTH EVERY 12 HOURS AS NEEDED FOR COUGH, Disp: , Rfl:    citalopram (CELEXA) 40 MG tablet, Take 40 mg by mouth daily., Disp: , Rfl:    citalopram (CELEXA) 40 MG tablet, Take 1 tablet by mouth daily., Disp: , Rfl:    dexamethasone (DECADRON) 2 MG tablet, TAKE 1 TABLET DAILY WITH BREAKFAST FOR 20 DAYS THEN TAKE 1/2 TABLET DAILY FOR 10 DAYS, Disp: , Rfl:    empagliflozin (JARDIANCE) 10 MG TABS tablet, Take 1 tablet by mouth daily., Disp: , Rfl:    Ensure Max Protein (ENSURE MAX  PROTEIN) LIQD, Take 330 mLs (11 oz total) by mouth 2 (two) times daily between meals. (Patient not taking: Reported on 01/10/2023), Disp: 660 mL, Rfl: 0   Ferrous Fum-Iron Polysacch 53-53 MG CAPS, Take by mouth., Disp: , Rfl:    ferrous sulfate 325 (65 FE) MG tablet, Take 1 tablet (325 mg total) by mouth daily., Disp: 30 tablet, Rfl: 0   fexofenadine (ALLEGRA) 180 MG tablet, Take by mouth., Disp: , Rfl:    fluconazole (DIFLUCAN) 150 MG tablet, Take by mouth., Disp: , Rfl:    fluticasone (FLONASE) 50 MCG/ACT nasal spray, Place 2 sprays into both nostrils daily. (Patient not taking: Reported on 01/10/2023), Disp: , Rfl:    fluticasone (FLONASE) 50 MCG/ACT nasal spray, Place into the nose., Disp: , Rfl:    furosemide (LASIX) 40 MG tablet, TAKE 1 TABLET BY MOUTH ONCE DAILY AS NEEDED FOR EDEMA HOLD IF SYSTOLIC BLOOD PRESSURE<100, Disp: , Rfl:    HUMALOG KWIKPEN 100 UNIT/ML KwikPen, Inject 0.08 mLs (8 Units total) into the skin 3 (three) times daily. Per sliding scale, Disp: 15 mL, Rfl: 11   HYDROcodone-acetaminophen (NORCO) 10-325 MG tablet, Take by mouth., Disp: , Rfl:    ipratropium-albuterol (DUONEB) 0.5-2.5 (3) MG/3ML SOLN, Inhale into the lungs., Disp: , Rfl:    LAGEVRIO 200 MG CAPS capsule, TAKE FOUR CAPSULES BY MOUTH  TWO TIMES A DAY FOR FIVE DAYS (Patient not taking: Reported on 01/10/2023), Disp: , Rfl:    levothyroxine (SYNTHROID) 100 MCG tablet, Take by mouth., Disp: , Rfl:    levothyroxine (SYNTHROID) 125 MCG tablet, Take by mouth., Disp: , Rfl:    levothyroxine (SYNTHROID, LEVOTHROID) 175 MCG tablet, Take 175 mcg by mouth daily before breakfast., Disp: , Rfl:    losartan (COZAAR) 25 MG tablet, Take 1 tablet by mouth at bedtime., Disp: , Rfl:    magnesium oxide (MAG-OX) 400 (240 Mg) MG tablet, , Disp: , Rfl:    magnesium oxide (MAG-OX) 400 MG tablet, Take by mouth., Disp: , Rfl:    metoprolol tartrate (LOPRESSOR) 25 MG tablet, , Disp: , Rfl:    pantoprazole (PROTONIX) 40 MG tablet, Take 40  mg by mouth daily., Disp: , Rfl:    predniSONE (DELTASONE) 10 MG tablet, , Disp: , Rfl:    propranolol (INDERAL) 40 MG tablet, , Disp: , Rfl:    sulfamethoxazole-trimethoprim (BACTRIM) 400-80 MG tablet, TAKE 1 TABLET BY MOUTH DAILY FOR 21 DAYS (Patient not taking: Reported on 01/10/2023), Disp: , Rfl:    TRESIBA FLEXTOUCH 200 UNIT/ML SOPN, Inject 26 Units into the skin every evening., Disp: , Rfl:    verapamil (CALAN-SR) 120 MG CR tablet, Take by mouth., Disp: , Rfl:    vitamin B-12 (CYANOCOBALAMIN) 1000 MCG tablet, Take 1,000 mcg by mouth daily. Reported on 07/07/2015, Disp: , Rfl:   Past Medical History: Past Medical History:  Diagnosis Date   Anemia    Anxiety    Colon cancer (HCC)    Diabetes mellitus without complication (HCC)    Family history of adverse reaction to anesthesia    daughter had reaction to anesthesia and was in icu   GERD (gastroesophageal reflux disease)    Hypertension    Hypothyroidism    Kidney cancer, primary, with metastasis from kidney to other site [facility]) 08/26/2014   right   Pancreas cancer (HCC) 2018   Thyroid cancer (HCC)     Tobacco Use: Social History   Tobacco Use  Smoking Status Former   Current packs/day: 0.00   Types: Cigarettes   Quit date: 07/29/1994   Years since quitting: 28.7  Smokeless Tobacco Never    Labs: Review Flowsheet       Latest Ref Rng & Units 07/29/2014 03/19/2019  Labs for ITP Cardiac and Pulmonary Rehab  Trlycerides <150 mg/dL - 161   Hemoglobin W9U 4.8 - 5.6 % 7.3  7.4      Pulmonary Assessment Scores:  Pulmonary Assessment Scores     Row Name 01/14/23 1516         ADL UCSD   ADL Phase Entry     SOB Score total 81     Rest 1     Walk 3     Stairs 5     Bath 2     Dress 3     Shop 3       CAT Score   CAT Score 23       mMRC Score   mMRC Score 2              UCSD: Self-administered rating of dyspnea associated with activities of daily living (ADLs) 6-point scale (0 = "not at all" to 5 =  "maximal or unable to do because of breathlessness")  Scoring Scores range from 0 to 120.  Minimally important difference is 5 units  CAT: CAT can identify the health  impairment of COPD patients and is better correlated with disease progression.  CAT has a scoring range of zero to 40. The CAT score is classified into four groups of low (less than 10), medium (10 - 20), high (21-30) and very high (31-40) based on the impact level of disease on health status. A CAT score over 10 suggests significant symptoms.  A worsening CAT score could be explained by an exacerbation, poor medication adherence, poor inhaler technique, or progression of COPD or comorbid conditions.  CAT MCID is 2 points  mMRC: mMRC (Modified Medical Research Council) Dyspnea Scale is used to assess the degree of baseline functional disability in patients of respiratory disease due to dyspnea. No minimal important difference is established. A decrease in score of 1 point or greater is considered a positive change.   Pulmonary Function Assessment:  Pulmonary Function Assessment - 01/10/23 0929       Breath   Shortness of Breath Yes;Limiting activity             Exercise Target Goals: Exercise Program Goal: Individual exercise prescription set using results from initial 6 min walk test and THRR while considering  patient's activity barriers and safety.   Exercise Prescription Goal: Initial exercise prescription builds to 30-45 minutes a day of aerobic activity, 2-3 days per week.  Home exercise guidelines will be given to patient during program as part of exercise prescription that the participant will acknowledge.  Education: Aerobic Exercise: - Group verbal and visual presentation on the components of exercise prescription. Introduces F.I.T.T principle from ACSM for exercise prescriptions.  Reviews F.I.T.T. principles of aerobic exercise including progression. Written material given at graduation.   Education:  Resistance Exercise: - Group verbal and visual presentation on the components of exercise prescription. Introduces F.I.T.T principle from ACSM for exercise prescriptions  Reviews F.I.T.T. principles of resistance exercise including progression. Written material given at graduation.    Education: Exercise & Equipment Safety: - Individual verbal instruction and demonstration of equipment use and safety with use of the equipment. Flowsheet Row Pulmonary Rehab from 01/14/2023 in [facility] Cardiac and Pulmonary Rehab  Date 01/10/23  Educator [facility]  Instruction Review Code 1- Verbalizes Understanding       Education: Exercise Physiology & General Exercise Guidelines: - Group verbal and written instruction with models to review the exercise physiology of the cardiovascular system and associated critical values. Provides general exercise guidelines with specific guidelines to those with heart or lung disease.    Education: Flexibility, Balance, Mind/Body Relaxation: - Group verbal and visual presentation with interactive activity on the components of exercise prescription. Introduces F.I.T.T principle from ACSM for exercise prescriptions. Reviews F.I.T.T. principles of flexibility and balance exercise training including progression. Also discusses the mind body connection.  Reviews various relaxation techniques to help reduce and manage stress (i.e. Deep breathing, progressive muscle relaxation, and visualization). Balance handout provided to take home. Written material given at graduation.   Activity Barriers & Risk Stratification:  Activity Barriers & Cardiac Risk Stratification - 01/14/23 1526       Activity Barriers & Cardiac Risk Stratification   Activity Barriers Left Knee Replacement;Right Knee Replacement;Balance Concerns;History of Falls;Shortness of Breath             6 Minute Walk:  6 Minute Walk     Row Name 01/14/23 1520         6 Minute Walk   Phase Initial     Distance 560  feet     Walk Time 4.08  minutes     # of Rest Breaks 1     MPH 1.56     METS 1.23     RPE 13     Perceived Dyspnea  2     VO2 Peak 4.31     Symptoms Yes (comment)     Comments SOB     Resting HR 96 bpm     Resting BP 122/62     Resting Oxygen Saturation  98 %     Exercise Oxygen Saturation  during 6 min walk 91 %     Max Ex. HR 137 bpm     Max Ex. BP 148/66     2 Minute Post BP 136/62       Interval HR   1 Minute HR 114     2 Minute HR 127     3 Minute HR 137     4 Minute HR 117     5 Minute HR 109     6 Minute HR 113     2 Minute Post HR 102     Interval Heart Rate? Yes       Interval Oxygen   Interval Oxygen? Yes     Baseline Oxygen Saturation % 98 %     1 Minute Oxygen Saturation % 91 %     1 Minute Liters of Oxygen 2 L  Pulsed     2 Minute Oxygen Saturation % 91 %     2 Minute Liters of Oxygen 2 L     3 Minute Oxygen Saturation % 92 %     3 Minute Liters of Oxygen 2 L     4 Minute Oxygen Saturation % 92 %     4 Minute Liters of Oxygen 2 L     5 Minute Oxygen Saturation % 96 %     5 Minute Liters of Oxygen 2 L     6 Minute Oxygen Saturation % 93 %     6 Minute Liters of Oxygen 2 L     2 Minute Post Oxygen Saturation % 98 %     2 Minute Post Liters of Oxygen 2 L             Oxygen Initial Assessment:  Oxygen Initial Assessment - 01/10/23 0927       Home Oxygen   Home Oxygen Device Home Concentrator;Portable Concentrator    Sleep Oxygen Prescription Continuous    Liters per minute 2    Home Exercise Oxygen Prescription Continuous    Liters per minute 2    Home Resting Oxygen Prescription Continuous    Liters per minute 2    Compliance with Home Oxygen Use Yes      Initial 6 min Walk   Oxygen Used Continuous;Portable Concentrator    Liters per minute 2      Program Oxygen Prescription   Program Oxygen Prescription Continuous    Liters per minute 2      Intervention   Short Term Goals To learn and exhibit compliance with exercise, home and  travel O2 prescription;To learn and understand importance of monitoring SPO2 with pulse oximeter and demonstrate accurate use of the pulse oximeter.;To learn and understand importance of maintaining oxygen saturations>88%;To learn and demonstrate proper pursed lip breathing techniques or other breathing techniques. ;To learn and demonstrate proper use of respiratory medications    Long  Term Goals Exhibits compliance with exercise, home  and travel O2 prescription;Verbalizes importance of monitoring SPO2 with  pulse oximeter and return demonstration;Maintenance of O2 saturations>88%;Exhibits proper breathing techniques, such as pursed lip breathing or other method taught during program session;Compliance with respiratory medication;Demonstrates proper use of MDI's             Oxygen Re-Evaluation:  Oxygen Re-Evaluation     Row Name 01/17/23 1409 02/21/23 1354 03/21/23 1601         Program Oxygen Prescription   Program Oxygen Prescription -- Continuous Continuous     Liters per minute -- 2 2       Home Oxygen   Home Oxygen Device -- Home Concentrator;Portable Concentrator Home Concentrator;Portable Concentrator     Sleep Oxygen Prescription -- Continuous Continuous     Liters per minute -- 2 2     Home Exercise Oxygen Prescription -- Continuous Continuous     Liters per minute -- 2 2  using 1 in program with good sats     Home Resting Oxygen Prescription -- Continuous Continuous     Liters per minute -- 2 2     Compliance with Home Oxygen Use -- Yes Yes       Goals/Expected Outcomes   Short Term Goals -- To learn and demonstrate proper pursed lip breathing techniques or other breathing techniques.  To learn and demonstrate proper pursed lip breathing techniques or other breathing techniques. ;To learn and exhibit compliance with exercise, home and travel O2 prescription;To learn and understand importance of monitoring SPO2 with pulse oximeter and demonstrate accurate use of the pulse  oximeter.;To learn and understand importance of maintaining oxygen saturations>88%     Long  Term Goals -- Exhibits proper breathing techniques, such as pursed lip breathing or other method taught during program session Exhibits compliance with exercise, home  and travel O2 prescription;Verbalizes importance of monitoring SPO2 with pulse oximeter and return demonstration;Maintenance of O2 saturations>88%;Exhibits proper breathing techniques, such as pursed lip breathing or other method taught during program session     Comments Reviewed PLB technique with pt.  Talked about how it works and it's importance in maintaining their exercise saturations. Informed patient how to perform the Pursed Lipped breathing technique. Told patient to Inhale through the nose and out the mouth with pursed lips to keep their airways open, help oxygenate them better, practice when at rest or doing strenuous activity. Patient Verbalizes understanding of technique and will work on and be reiterated during [facility]. Rebecca Kirby is aware of how to look at her pulse oximetry for readings, she is trying to wean her levels form 2 to 1. She has been able to keep her sats in good range at 1L/min. USes PLB as needed     Goals/Expected Outcomes Short: Become more profiecient at using PLB. Long: Become independent at using PLB. Short: use PLB with exertion. Long: use PLB on exertion proficiently and independently. STG continue with exercise progression, keeping sats above 88% LTG wean oxygen to lowest level allowed by physician during exertion              Oxygen Discharge (Final Oxygen Re-Evaluation):  Oxygen Re-Evaluation - 03/21/23 1601       Program Oxygen Prescription   Program Oxygen Prescription Continuous    Liters per minute 2      Home Oxygen   Home Oxygen Device Home Concentrator;Portable Concentrator    Sleep Oxygen Prescription Continuous    Liters per minute 2    Home Exercise Oxygen Prescription Continuous     Liters per minute 2   using 1  in program with good sats   Home Resting Oxygen Prescription Continuous    Liters per minute 2    Compliance with Home Oxygen Use Yes      Goals/Expected Outcomes   Short Term Goals To learn and demonstrate proper pursed lip breathing techniques or other breathing techniques. ;To learn and exhibit compliance with exercise, home and travel O2 prescription;To learn and understand importance of monitoring SPO2 with pulse oximeter and demonstrate accurate use of the pulse oximeter.;To learn and understand importance of maintaining oxygen saturations>88%    Long  Term Goals Exhibits compliance with exercise, home  and travel O2 prescription;Verbalizes importance of monitoring SPO2 with pulse oximeter and return demonstration;Maintenance of O2 saturations>88%;Exhibits proper breathing techniques, such as pursed lip breathing or other method taught during program session    Comments Rebecca Kirby is aware of how to look at her pulse oximetry for readings, she is trying to wean her levels form 2 to 1. She has been able to keep her sats in good range at 1L/min. USes PLB as needed    Goals/Expected Outcomes STG continue with exercise progression, keeping sats above 88% LTG wean oxygen to lowest level allowed by physician during exertion             Initial Exercise Prescription:  Initial Exercise Prescription - 01/14/23 1500       Date of Initial Exercise RX and Referring Provider   Date 01/14/23    Referring Provider Dr. Vida Rigger, MD      Oxygen   Oxygen Continuous    Liters 2    Maintain Oxygen Saturation 88% or higher      Recumbant Bike   Level 1    RPM 50    Watts 15    Minutes 15    METs 1.23      NuStep   Level 1    SPM 80    Minutes 15    METs 1.23      Track   Laps 10    Minutes 15    METs 1.54      Prescription Details   Frequency (times per week) 2    Duration Progress to 30 minutes of continuous aerobic without signs/symptoms of  physical distress      Intensity   THRR 40-80% of Max Heartrate 114-132    Ratings of Perceived Exertion 11-13    Perceived Dyspnea 0-4      Progression   Progression Continue to progress workloads to maintain intensity without signs/symptoms of physical distress.      Resistance Training   Training Prescription Yes    Weight 4 lb    Reps 10-15             Perform Capillary Blood Glucose checks as needed.  Exercise Prescription Changes:   Exercise Prescription Changes     Row Name 01/14/23 1500 01/29/23 1400 02/12/23 1400 02/26/23 0800 03/27/23 0800     Response to Exercise   Blood Pressure (Admit) 122/62 136/68 120/56 124/68 112/64   Blood Pressure (Exercise) 148/66 158/70 154/60 144/72 134/68   Blood Pressure (Exit) 136/62 122/60 136/60 122/54 104/56   Heart Rate (Admit) 96 bpm 91 bpm 87 bpm 96 bpm 90 bpm   Heart Rate (Exercise) 137 bpm 103 bpm 106 bpm 106 bpm 113 bpm   Heart Rate (Exit) 102 bpm 90 bpm 94 bpm 100 bpm 94 bpm   Oxygen Saturation (Admit) 98 % 96 % 96 % 97 % 94 %  Oxygen Saturation (Exercise) 91 % 92 % 91 % 91 % 88 %   Oxygen Saturation (Exit) 98 % 92 % 91 % 94 % 94 %   Rating of Perceived Exertion (Exercise) 13 13 12 14 13    Perceived Dyspnea (Exercise) 2 0 0 0 0   Symptoms SOB SOB SOB SOB SOB   Comments Results First two days of exercise -- -- --   Duration -- Progress to 30 minutes of  aerobic without signs/symptoms of physical distress Continue with 30 min of aerobic exercise without signs/symptoms of physical distress. Continue with 30 min of aerobic exercise without signs/symptoms of physical distress. Continue with 30 min of aerobic exercise without signs/symptoms of physical distress.   Intensity -- THRR unchanged THRR unchanged THRR unchanged THRR unchanged     Progression   Progression -- Continue to progress workloads to maintain intensity without signs/symptoms of physical distress. Continue to progress workloads to maintain intensity  without signs/symptoms of physical distress. Continue to progress workloads to maintain intensity without signs/symptoms of physical distress. Continue to progress workloads to maintain intensity without signs/symptoms of physical distress.   Average METs -- 2.35 2.13 1.83 1.98     Resistance Training   Training Prescription -- Yes Yes Yes Yes   Weight -- 4 lb 4 lb 4 lb 4 lb   Reps -- 10-15 10-15 10-15 10-15     Interval Training   Interval Training -- No No No No     Oxygen   Oxygen -- Continuous Continuous Continuous Continuous   Liters -- 2 0-2 1 1      Recumbant Bike   Level -- 2 1 -- --   Watts -- 18 18 -- --   Minutes -- 15 15 -- --   METs -- 2.6 2.6 -- --     NuStep   Level -- 1 3 2 3    Minutes -- 15 30 30 15    METs -- 2.3 2.1 -- 2.1     Arm Ergometer   Level -- -- -- 1 --   Minutes -- -- -- 15 --   METs -- -- -- 1.7 --     T5 Nustep   Level -- -- -- 1 --   Minutes -- -- -- 15 --   METs -- -- -- 2 --     Track   Laps -- -- -- -- 16   Minutes -- -- -- -- 15   METs -- -- -- -- 1.87     Oxygen   Maintain Oxygen Saturation -- 88% or higher 88% or higher 88% or higher 88% or higher    Row Name 04/09/23 1300 04/22/23 1600 05/07/23 1300         Response to Exercise   Blood Pressure (Admit) 118/54 126/60 122/58     Blood Pressure (Exit) 118/58 110/50 110/52     Heart Rate (Admit) 98 bpm 67 bpm 97 bpm     Heart Rate (Exercise) 110 bpm 115 bpm 104 bpm     Heart Rate (Exit) 101 bpm 93 bpm 90 bpm     Oxygen Saturation (Admit) 97 % 97 % 91 %     Oxygen Saturation (Exercise) 90 % 88 % 88 %     Oxygen Saturation (Exit) 96 % 92 % 91 %     Rating of Perceived Exertion (Exercise) 15 13 13      Perceived Dyspnea (Exercise) 2 1 2      Symptoms SOB none SOB  Duration Continue with 30 min of aerobic exercise without signs/symptoms of physical distress. Continue with 30 min of aerobic exercise without signs/symptoms of physical distress. Continue with 30 min of aerobic  exercise without signs/symptoms of physical distress.     Intensity THRR unchanged THRR unchanged THRR unchanged       Progression   Progression Continue to progress workloads to maintain intensity without signs/symptoms of physical distress. Continue to progress workloads to maintain intensity without signs/symptoms of physical distress. Continue to progress workloads to maintain intensity without signs/symptoms of physical distress.     Average METs 2.04 1.99 1.78       Resistance Training   Training Prescription Yes Yes Yes     Weight 4 lb 4 lb 4 lb     Reps 10-15 10-15 10-15       Interval Training   Interval Training No No No       Oxygen   Oxygen Continuous Continuous Continuous     Liters 0-1 0-2 0-2       NuStep   Level 3 3 3      Minutes 15 15 15      METs 2.2 2.2 2       Track   Laps 22 16  Hallway 8     Minutes 15 15 15      METs 2.2 1.87 1.44       Oxygen   Maintain Oxygen Saturation 88% or higher 88% or higher 88% or higher              Exercise Comments:   Exercise Comments     Row Name 01/17/23 1408           Exercise Comments First full day of exercise!  Patient was oriented to gym and equipment including functions, settings, policies, and procedures.  Patient's individual exercise prescription and treatment plan were reviewed.  All starting workloads were established based on the results of the 6 minute walk test done at initial orientation visit.  The plan for exercise progression was also introduced and progression will be customized based on patient's performance and goals.                Exercise Goals and Review:   Exercise Goals     Row Name 01/14/23 1526             Exercise Goals   Increase Physical Activity Yes       Intervention Provide advice, education, support and counseling about physical activity/exercise needs.;Develop an individualized exercise prescription for aerobic and resistive training based on initial evaluation  findings, risk stratification, comorbidities and participant's personal goals.       Expected Outcomes Short Term: Attend rehab on a regular basis to increase amount of physical activity.;Long Term: Exercising regularly at least 3-5 days a week.;Long Term: Add in home exercise to make exercise part of routine and to increase amount of physical activity.       Increase Strength and Stamina Yes       Intervention Develop an individualized exercise prescription for aerobic and resistive training based on initial evaluation findings, risk stratification, comorbidities and participant's personal goals.;Provide advice, education, support and counseling about physical activity/exercise needs.       Expected Outcomes Short Term: Increase workloads from initial exercise prescription for resistance, speed, and METs.;Short Term: Perform resistance training exercises routinely during rehab and add in resistance training at home;Long Term: Improve cardiorespiratory fitness, muscular endurance and strength as measured by increased  METs and functional capacity ( )       Able to understand and use rate of perceived exertion (RPE) scale Yes       Intervention Provide education and explanation on how to use RPE scale       Expected Outcomes Short Term: Able to use RPE daily in rehab to express subjective intensity level;Long Term:  Able to use RPE to guide intensity level when exercising independently       Able to understand and use Dyspnea scale Yes       Intervention Provide education and explanation on how to use Dyspnea scale       Expected Outcomes Short Term: Able to use Dyspnea scale daily in rehab to express subjective sense of shortness of breath during exertion;Long Term: Able to use Dyspnea scale to guide intensity level when exercising independently       Knowledge and understanding of Target Heart Rate Range (THRR) Yes       Intervention Provide education and explanation of THRR including how the numbers  were predicted and where they are located for reference       Expected Outcomes Short Term: Able to state/look up THRR;Long Term: Able to use THRR to govern intensity when exercising independently;Short Term: Able to use daily as guideline for intensity in rehab       Able to check pulse independently Yes       Intervention Provide education and demonstration on how to check pulse in carotid and radial arteries.;Review the importance of being able to check your own pulse for safety during independent exercise       Expected Outcomes Short Term: Able to explain why pulse checking is important during independent exercise;Long Term: Able to check pulse independently and accurately       Understanding of Exercise Prescription Yes       Intervention Provide education, explanation, and written materials on patient's individual exercise prescription       Expected Outcomes Short Term: Able to explain program exercise prescription;Long Term: Able to explain home exercise prescription to exercise independently                Exercise Goals Re-Evaluation :  Exercise Goals Re-Evaluation     Row Name 01/17/23 1408 01/29/23 1454 02/12/23 1444 02/26/23 0810 03/27/23 0827     Exercise Goal Re-Evaluation   Exercise Goals Review Able to understand and use rate of perceived exertion (RPE) scale;Able to understand and use Dyspnea scale;Knowledge and understanding of Target Heart Rate Range (THRR);Understanding of Exercise Prescription Increase Physical Activity;Increase Strength and Stamina;Understanding of Exercise Prescription Increase Physical Activity;Increase Strength and Stamina;Understanding of Exercise Prescription Increase Physical Activity;Increase Strength and Stamina;Understanding of Exercise Prescription Increase Physical Activity;Increase Strength and Stamina;Understanding of Exercise Prescription   Comments Reviewed RPE and dyspnea scale, THR and program prescription with pt today.  Pt voiced  understanding and was given a copy of goals to take home. Rebecca Kirby is off to a good start in rehab. She has done well on the T4 nustep at level 1 and improved to level 2 on the recumbent bike. She also has done well with 4 lb hand weights for resistance training. She has maintained her O2 saturations above 92% during exercise as well. We will continue to monitor her progress in the program. Rebecca Kirby is doing well in the program. She continues to do well on the T4 nustep and improved to level 3. She also has done well on the recumbent bike at level  1 and continues to use 4 lb hand weights for resistance training. She has not done any walking in the program up to this point. We will continue to monitor her progress in the program. Rebecca Kirby is doing well in the program. She began using the arm crank and T5 nustep and did well at level 1 on each. She has not done any walking in the program up to this point, but we will encourage her to begin doing so. We will continue to monitor her progresss in the program. Rebecca Kirby continues to do well in the program. She was able to increase her level on the T4 nustep from level 2 to 3. She was also able to walk 16 laps on the track in 15 minutes. We will continue to encourage and monitor her progress in the program.   Expected Outcomes Short: Use RPE daily to regulate intensity. Long: Follow program prescription in THR. Short: Continue to follow current exercise prescription. Long: Continue exercise to improve strength and stamina. Short: Begin walking the track in rehab. Long: Continue exercise to improve strength and stamina. Short: Begin walking the track in rehab. Long: Continue exercise to improve strength and stamina. Short: Continue to walk the track and push for more laps. Long: Continue exercise to improvre strength and stamina.    Row Name 04/09/23 1350 04/22/23 1613 05/07/23 1352         Exercise Goal Re-Evaluation   Exercise Goals Review Increase Physical Activity;Increase  Strength and Stamina;Understanding of Exercise Prescription Increase Physical Activity;Increase Strength and Stamina;Understanding of Exercise Prescription Increase Physical Activity;Increase Strength and Stamina;Understanding of Exercise Prescription     Comments Rebecca Kirby is doing well in the program. She continues to do well at level 3 on the T4 nustep. She also increased her walking distance up to 22 laps on the track. She has also been exercising on room air at times and has done well, as her oxygen saturation did not drop below 90% since the last review. We will continue to monitor her progress in the program. Rebecca Kirby is doing well in the program. She has mantained level 3 on the T4 nustep. She switched to the hallway track and walked 16 laps. She is still exercising on room air at times and has done well, as her oxygen saturation did not drop below 88%. We will continue to monitor her progress in the program. Rebecca Kirby is doing well in rehab. She continues to work at level 3 on the T4 nustep. She also has been walking on the track without continuous oxygen, and has done well as her O2 saturation has stayed above 88%. We will continue to monitor her progress in the program.     Expected Outcomes Short: Continue to walk the track and push for more laps. Long: Continue exercise to improvre strength and stamina. Short: Try level 4 on the T4 nustep. Long: Continue exercise to improvre strength and stamina. Short: Continue to push for more laps on the track. Long: Continue exercise to improvre strength and stamina.              Discharge Exercise Prescription (Final Exercise Prescription Changes):  Exercise Prescription Changes - 05/07/23 1300       Response to Exercise   Blood Pressure (Admit) 122/58    Blood Pressure (Exit) 110/52    Heart Rate (Admit) 97 bpm    Heart Rate (Exercise) 104 bpm    Heart Rate (Exit) 90 bpm    Oxygen Saturation (Admit) 91 %  Oxygen Saturation (Exercise) 88 %    Oxygen  Saturation (Exit) 91 %    Rating of Perceived Exertion (Exercise) 13    Perceived Dyspnea (Exercise) 2    Symptoms SOB    Duration Continue with 30 min of aerobic exercise without signs/symptoms of physical distress.    Intensity THRR unchanged      Progression   Progression Continue to progress workloads to maintain intensity without signs/symptoms of physical distress.    Average METs 1.78      Resistance Training   Training Prescription Yes    Weight 4 lb    Reps 10-15      Interval Training   Interval Training No      Oxygen   Oxygen Continuous    Liters 0-2      NuStep   Level 3    Minutes 15    METs 2      Track   Laps 8    Minutes 15    METs 1.44      Oxygen   Maintain Oxygen Saturation 88% or higher             Nutrition:  Target Goals: Understanding of nutrition guidelines, daily intake of sodium 1500mg , cholesterol 200mg , calories 30% from fat and 7% or less from saturated fats, daily to have 5 or more servings of fruits and vegetables.  Education: All About Nutrition: -Group instruction provided by verbal, written material, interactive activities, discussions, models, and posters to present general guidelines for heart healthy nutrition including fat, fiber, MyPlate, the role of sodium in heart healthy nutrition, utilization of the nutrition label, and utilization of this knowledge for meal planning. Follow up email sent as well. Written material given at graduation.   Biometrics:  Pre Biometrics - 01/14/23 1527       Pre Biometrics   Height 5' 5.5" (1.664 m)    Weight 211 lb (95.7 kg)    Waist Circumference 43.5 inches    Hip Circumference 50 inches    Waist to Hip Ratio 0.87 %    BMI (Calculated) 34.57    Single Leg Stand 2.85 seconds              Nutrition Therapy Plan and Nutrition Goals:  Nutrition Therapy & Goals - 01/14/23 1514       Nutrition Therapy   RD appointment deferred Yes      Intervention Plan   Intervention  Prescribe, educate and counsel regarding individualized specific dietary modifications aiming towards targeted core components such as weight, hypertension, lipid management, diabetes, heart failure and other comorbidities.    Expected Outcomes Short Term Goal: Understand basic principles of dietary content, such as calories, fat, sodium, cholesterol and nutrients.;Short Term Goal: A plan has been developed with personal nutrition goals set during dietitian appointment.;Long Term Goal: Adherence to prescribed nutrition plan.             Nutrition Assessments:  MEDIFICTS Score Key: >=70 Need to make dietary changes  40-70 Heart Healthy Diet <= 40 Therapeutic Level Cholesterol Diet  Flowsheet Row Pulmonary Rehab from 01/14/2023 in [facility] Card[facility] Pulmonary Rehab  Picture Your Plate Total Score on Admission 46      Picture Your Plate Scores: <16 Unhealthy dietary pattern with much room for improvement. 41-50 Dietary pattern unlikely to meet recommendations for good health and room for improvement. 51-60 More healthful dietary pattern, with some room for improvement.  >60 Healthy dietary pattern, although there may be some specific  behaviors that could be improved.   Nutrition Goals Re-Evaluation:  Nutrition Goals Re-Evaluation     Row Name 02/12/23 0931 02/21/23 1353 03/21/23 1611         Goals   Current Weight 208 lb 11.2 oz (94.7 kg) -- --     Nutrition Goal RD appointment deferred -- RD appointment deferred     Comment -- Patient was informed on why it is important to maintain a balanced diet when dealing with Respiratory issues. Explained that it takes a lot of energy to breath and when they are short of breath often they will need to have a good diet to help keep up with the calories they are expending for breathing. COntinues to defer RD visit.     Expected Outcome -- Short: Choose and plan snacks accordingly to patients caloric intake to improve breathing. Long: Maintain a  diet independently that meets their caloric intake to aid in daily shortness of breath. --              Nutrition Goals Discharge (Final Nutrition Goals Re-Evaluation):  Nutrition Goals Re-Evaluation - 03/21/23 1611       Goals   Nutrition Goal RD appointment deferred    Comment COntinues to defer RD visit.             Psychosocial: Target Goals: Acknowledge presence or absence of significant depression and/or stress, maximize coping skills, provide positive support system. Participant is able to verbalize types and ability to use techniques and skills needed for reducing stress and depression.   Education: Stress, Anxiety, and Depression - Group verbal and visual presentation to define topics covered.  Reviews how body is impacted by stress, anxiety, and depression.  Also discusses healthy ways to reduce stress and to treat/manage anxiety and depression.  Written material given at graduation.   Education: Sleep Hygiene -Provides group verbal and written instruction about how sleep can affect your health.  Define sleep hygiene, discuss sleep cycles and impact of sleep habits. Review good sleep hygiene tips.    Initial Review & Psychosocial Screening:  Initial Psych Review & Screening - 01/10/23 0931       Initial Review   Current issues with Current Psychotropic Meds;Current Depression;History of Depression      Family Dynamics   Good Support System? Yes    Comments Rebecca Kirby has a lot of friends, chruch and family that support her. She does take medication for depression. She is ready to get started in the program since her last bout with Covid she has been more short of breath.      Barriers   Psychosocial barriers to participate in program The patient should benefit from training in stress management and relaxation.      Screening Interventions   Interventions Encouraged to exercise;To provide support and resources with identified psychosocial needs;Provide feedback  about the scores to participant    Expected Outcomes Short Term goal: Utilizing psychosocial counselor, staff and physician to assist with identification of specific Stressors or current issues interfering with healing process. Setting desired goal for each stressor or current issue identified.;Long Term Goal: Stressors or current issues are controlled or eliminated.;Short Term goal: Identification and review with participant of any Quality of Life or Depression concerns found by scoring the questionnaire.;Long Term goal: The participant improves quality of Life and PHQ9 Scores as seen by post scores and/or verbalization of changes             Quality of Life Scores:  Scores of 19 and below usually indicate a poorer quality of life in these areas.  A difference of  2-3 points is a clinically meaningful difference.  A difference of 2-3 points in the total score of the Quality of Life Index has been associated with significant improvement in overall quality of life, self-image, physical symptoms, and general health in studies assessing change in quality of life.  PHQ-9: Review Flowsheet       01/14/2023  Depression screen PHQ 2/9  Decreased Interest 0  Down, Depressed, Hopeless 0  PHQ - 2 Score 0  Altered sleeping 2  Tired, decreased energy 0  Change in appetite 0  Feeling bad or failure about yourself  0  Trouble concentrating 1  Moving slowly or fidgety/restless 0  Suicidal thoughts 1  PHQ-9 Score 4  Difficult doing work/chores Somewhat difficult   Interpretation of Total Score  Total Score Depression Severity:  1-4 = Minimal depression, 5-9 = Mild depression, 10-14 = Moderate depression, 15-19 = Moderately severe depression, 20-27 = Severe depression   Psychosocial Evaluation and Intervention:  Psychosocial Evaluation - 01/10/23 0933       Psychosocial Evaluation & Interventions   Interventions Encouraged to exercise with the program and follow exercise prescription;Stress  management education;Relaxation education    Comments Rebecca Kirby has a lot of friends, chruch and family that support her. She does take medication for depression. She is ready to get started in the program since her last bout with Covid she has been more short of breath.    Expected Outcomes Short: Start [facility] to help with mood. Long: Maintain a healthy mental state.    Continue Psychosocial Services  Follow up required by staff             Psychosocial Re-Evaluation:  Psychosocial Re-Evaluation     Row Name 02/12/23 0926 02/21/23 1356 03/21/23 1605         Psychosocial Re-Evaluation   Current issues with None Identified None Identified None Identified     Comments Rebecca Kirby states that she has no current stressors, and states that she is getting good sleep. Her grandson, son and daughter make up her support system, and she can reach out to them when she is dealing with stress. Patient reports no issues with their current mental states, sleep, stress, depression or anxiety. Will follow up with patient in a few weeks for any changes. Continues tkaing meds to prevent depression symptoms, states it helps her not get angry and yell at others. She is wotking on lowering her oxygen needs during exercise and is also working on increasing workloads a tolerated. She is happy to see the changes since she started the program     Expected Outcomes Short: Continue to attend [facility] regularly for regular exercise and social engagement. Long: Continue to improve symptoms and manage a positive mental state. Short: Continue to exercise regularly to support mental health and notify staff of any changes. Long: maintain mental health and well being through teaching of rehab or prescribed medications independently. STG continue with exercise progression and working on weaning her oxygen, continue to take meds as prescribed.     Interventions Encouraged to attend Pulmonary Rehabilitation for the exercise Encouraged to  attend Pulmonary Rehabilitation for the exercise Encouraged to attend Pulmonary Rehabilitation for the exercise     Continue Psychosocial Services  Follow up required by staff Follow up required by staff Follow up required by staff  Psychosocial Discharge (Final Psychosocial Re-Evaluation):  Psychosocial Re-Evaluation - 03/21/23 1605       Psychosocial Re-Evaluation   Current issues with None Identified    Comments Continues tkaing meds to prevent depression symptoms, states it helps her not get angry and yell at others. She is wotking on lowering her oxygen needs during exercise and is also working on increasing workloads a tolerated. She is happy to see the changes since she started the program    Expected Outcomes STG continue with exercise progression and working on weaning her oxygen, continue to take meds as prescribed.    Interventions Encouraged to attend Pulmonary Rehabilitation for the exercise    Continue Psychosocial Services  Follow up required by staff             Education: Education Goals: Education classes will be provided on a weekly basis, covering required topics. Participant will state understanding/return demonstration of topics presented.  Learning Barriers/Preferences:  Learning Barriers/Preferences - 01/10/23 0930       Learning Barriers/Preferences   Learning Barriers Sight    Learning Preferences None             General Pulmonary Education Topics:  Infection Prevention: - Provides verbal and written material to individual with discussion of infection control including proper hand washing and proper equipment cleaning during exercise session. Flowsheet Row Pulmonary Rehab from 01/14/2023 in [facility] Cardiac and Pulmonary Rehab  Date 01/10/23  Educator [facility]  Instruction Review Code 1- Verbalizes Understanding       Falls Prevention: - Provides verbal and written material to individual with discussion of falls prevention and  safety. Flowsheet Row Pulmonary Rehab from 01/14/2023 in [facility] Cardiac and Pulmonary Rehab  Date 01/10/23  Educator [facility]  Instruction Review Code 1- Verbalizes Understanding       Chronic Lung Disease Review: - Group verbal instruction with posters, models, PowerPoint presentations and videos,  to review new updates, new respiratory medications, new advancements in procedures and treatments. Providing information on websites and "800" numbers for continued self-education. Includes information about supplement oxygen, available portable oxygen systems, continuous and intermittent flow rates, oxygen safety, concentrators, and Medicare reimbursement for oxygen. Explanation of Pulmonary Drugs, including class, frequency, complications, importance of spacers, rinsing mouth after steroid MDI's, and proper cleaning methods for nebulizers. Review of basic lung anatomy and physiology related to function, structure, and complications of lung disease. Review of risk factors. Discussion about methods for diagnosing sleep apnea and types of masks and machines for OSA. Includes a review of the use of types of environmental controls: home humidity, furnaces, filters, dust mite/pet prevention, HEPA vacuums. Discussion about weather changes, air quality and the benefits of nasal washing. Instruction on Warning signs, infection symptoms, calling MD promptly, preventive modes, and value of vaccinations. Review of effective airway clearance, coughing and/or vibration techniques. Emphasizing that all should Create an Action Plan. Written material given at graduation. Flowsheet Row Pulmonary Rehab from 01/14/2023 in [facility] Cardiac and Pulmonary Rehab  Education need identified 01/14/23       AED/CPR: - Group verbal and written instruction with the use of models to demonstrate the basic use of the AED with the basic ABC's of resuscitation.    Anatomy and Cardiac Procedures: - Group verbal and visual presentation and  models provide information about basic cardiac anatomy and function. Reviews the testing methods done to diagnose heart disease and the outcomes of the test results. Describes the treatment choices: Medical Management, Angioplasty, or Coronary Bypass Surgery for treating  various heart conditions including Myocardial Infarction, Angina, Valve Disease, and Cardiac Arrhythmias.  Written material given at graduation.   Medication Safety: - Group verbal and visual instruction to review commonly prescribed medications for heart and lung disease. Reviews the medication, class of the drug, and side effects. Includes the steps to properly store meds and maintain the prescription regimen.  Written material given at graduation.   Other: -Provides group and verbal instruction on various topics (see comments)   Knowledge Questionnaire Score:  Knowledge Questionnaire Score - 01/14/23 1516       Knowledge Questionnaire Score   Pre Score 15/18              Core Components/Risk Factors/Patient Goals at Admission:  Personal Goals and Risk Factors at Admission - 01/10/23 0930       Core Components/Risk Factors/Patient Goals on Admission    Weight Management Yes;Weight Maintenance    Intervention Weight Management: Develop a combined nutrition and exercise program designed to reach desired caloric intake, while maintaining appropriate intake of nutrient and fiber, sodium and fats, and appropriate energy expenditure required for the weight goal.;Weight Management: Provide education and appropriate resources to help participant work on and attain dietary goals.;Weight Management/Obesity: Establish reasonable short term and long term weight goals.    Expected Outcomes Short Term: Continue to assess and modify interventions until short term weight is achieved;Long Term: Adherence to nutrition and physical activity/exercise program aimed toward attainment of established weight goal;Weight Maintenance:  Understanding of the daily nutrition guidelines, which includes 25-35% calories from fat, 7% or less cal from saturated fats, less than 200mg  cholesterol, less than 1.5gm of sodium, & 5 or more servings of fruits and vegetables daily;Understanding recommendations for meals to include 15-35% energy as protein, 25-35% energy from fat, 35-60% energy from carbohydrates, less than 200mg  of dietary cholesterol, 20-35 gm of total fiber daily;Understanding of distribution of calorie intake throughout the day with the consumption of 4-5 meals/snacks    Improve shortness of breath with ADL's Yes    Intervention Provide education, individualized exercise plan and daily activity instruction to help decrease symptoms of SOB with activities of daily living.    Expected Outcomes Short Term: Improve cardiorespiratory fitness to achieve a reduction of symptoms when performing ADLs;Long Term: Be able to perform more ADLs without symptoms or delay the onset of symptoms    Diabetes Yes    Intervention Provide education about signs/symptoms and action to take for hypo/hyperglycemia.;Provide education about proper nutrition, including hydration, and aerobic/resistive exercise prescription along with prescribed medications to achieve blood glucose in normal ranges: Fasting glucose 65-99 mg/dL    Expected Outcomes Short Term: Participant verbalizes understanding of the signs/symptoms and immediate care of hyper/hypoglycemia, proper foot care and importance of medication, aerobic/resistive exercise and nutrition plan for blood glucose control.;Long Term: Attainment of HbA1C < 7%.    Hypertension Yes    Intervention Provide education on lifestyle modifcations including regular physical activity/exercise, weight management, moderate sodium restriction and increased consumption of fresh fruit, vegetables, and low fat dairy, alcohol moderation, and smoking cessation.;Monitor prescription use compliance.    Expected Outcomes Short Term:  Continued assessment and intervention until BP is < 140/81mm HG in hypertensive participants. < 130/94mm HG in hypertensive participants with diabetes, heart failure or chronic kidney disease.;Long Term: Maintenance of blood pressure at goal levels.             Education:Diabetes - Individual verbal and written instruction to review signs/symptoms of diabetes, desired ranges of glucose  level fasting, after meals and with exercise. Acknowledge that pre and post exercise glucose checks will be done for 3 sessions at entry of program. Flowsheet Row Pulmonary Rehab from 01/14/2023 in [facility]  Date 01/10/23  Educator [facility]  Instruction Review Code 1- Verbalizes Understanding       Know Your Numbers and Heart Failure: - Group verbal and visual instruction to discuss disease risk factors for cardiac and pulmonary disease and treatment options.  Reviews associated critical values for Overweight/Obesity, Hypertension, Cholesterol, and Diabetes.  Discusses basics of heart failure: signs/symptoms and treatments.  Introduces Heart Failure Zone chart for action plan for heart failure.  Written material given at graduation. Flowsheet Row Pulmonary Rehab from 01/14/2023 in [facility] Pul[facility] Re[facility]  Education need identified 01/14/23       Core Components/Risk Factors/Patient Goals Review:   Goals and Risk Factor Review     Row Name 02/12/23 0932 02/21/23 1352 03/21/23 1612         Core Components/Risk Factors/Patient Goals Review   Personal Goals Review Hypertension Improve shortness of breath with ADL's Improve shortness of breath with ADL's;Diabetes;Hypertension     Review Rebecca Kirby is taking her blood pressure every other day. She was taking it every day twice a day when she was prescribed new medication but has since gotten a better grasp on her blood pressure medication. Spoke to patient about their shortness of breath and what they can do to improve. Patient has  been informed of breathing techniques when starting the program. Patient is informed to tell staff if they have had any med changes and that certain meds they are taking or not taking can be causing shortness of breath. Is seeing improvement od shortness of breath symptoms with ADLS. Able to go longerwith less shortness off breath.  Gets stopped by her daughter because her HR is elevated to 120's.  DIabetes  A1c in hte 5-6 % range and her blood pressure has been good, still needs to be lower to be in goal range.  122-136/50-60 at cooldown.  She is compliant with all meds.  She has been weaning her oxygen with exertion and has been able to manage with  1 /L min with good sat results. She is pleased to be at this new level.     Expected Outcomes Short: Continue to take blood pressure regularly. Long: Continue to monitor blood pressure to deal with hypertension. Short: Attend [facility] regularly to improve shortness of breath with ADL's. Long: maintain independence with ADL's STG continue to work on BP results, take meds as prescribed and continue with exercise progression.  LTG all risk factor numbers are in goal range with her nutriton, meds and exercise management              Core Components/Risk Factors/Patient Goals at Discharge (Final Review):   Goals and Risk Factor Review - 03/21/23 1612       Core Components/Risk Factors/Patient Goals Review   Personal Goals Review Improve shortness of breath with ADL's;Diabetes;Hypertension    Review Is seeing improvement od shortness of breath symptoms with ADLS. Able to go longerwith less shortness off breath.  Gets stopped by her daughter because her HR is elevated to 120's.  DIabetes  A1c in hte 5-6 % range and her blood pressure has been good, still needs to be lower to be in goal range.  122-136/50-60 at cooldown.  She is compliant with all meds.  She has been weaning her oxygen with  exertion and has been able to manage with  1 /L min with good sat  results. She is pleased to be at this new level.    Expected Outcomes STG continue to work on BP results, take meds as prescribed and continue with exercise progression.  LTG all risk factor numbers are in goal range with her nutriton, meds and exercise management             ITP Comments:  ITP Comments     Row Name 01/10/23 0926 01/14/23 1510 01/16/23 0948 01/17/23 1401 02/13/23 1151   ITP Comments Virtual Visit completed. Patient informed on EP and RD appointment and 6 Minute walk test. Patient also informed of patient health questionnaires on My Chart. Patient Verbalizes understanding. Visit diagnosis can be found in [facility] 01/12/2023. Completed and gym orientation. Initial ITP created and sent for review to Dr. Jinny Sanders, Medical Director. 30 Day review completed. Medical Director ITP review done, changes made as directed, and signed approval by Medical Director.    new to program First full day of exercise!  Patient was oriented to gym and equipment including functions, settings, policies, and procedures.  Patient's individual exercise prescription and treatment plan were reviewed.  All starting workloads were established based on the results of the 6 minute walk test done at initial orientation visit.  The plan for exercise progression was also introduced and progression will be customized based on patient's performance and goals. 30 Day review completed. Medical Director ITP review done, changes made as directed, and signed approval by Medical Director.    new to program    Row Name 03/13/23 1303 04/10/23 0829 05/08/23 0827       ITP Comments 30 Day review completed. Medical Director ITP review done, changes made as directed, and signed approval by Medical Director.    new to program 30 Day review completed. Medical Director ITP review done, changes made as directed, and signed approval by Medical Director. 30 Day review completed. Medical Director ITP review done, changes made as  directed, and signed approval by Medical Director.              Comments: 30 Day review completed. Medical Director ITP review done, changes made as directed, and signed approval by Medical Director.

## 2023-05-09 ENCOUNTER — Encounter: Payer: 59 | Admitting: *Deleted

## 2023-05-09 DIAGNOSIS — R0609 Other forms of dyspnea: Secondary | ICD-10-CM | POA: Diagnosis not present

## 2023-05-09 NOTE — Progress Notes (Signed)
 Daily Session Note  Patient Details  Name: Rebecca Kirby MRN: [id number] Date of Birth: 09-15-1945 Referring Provider:   Flowsheet Row Pulmonary Rehab from 01/14/2023 in [facility] Card[facility] and Pulmonary Rehab  Referring Provider Dr. Vida Rigger, MD       Encounter Date: 05/09/2023  Check In:  Session Check In - 05/09/23 1411       Check-In   Supervising physician immediately available to respond to emergencies See telemetry face sheet for immediately available ER MD    Location [facility]-Cardiac & Pulmonary Rehab    Staff Present Susann Givens RN,BSN;Joseph Reino Kent RCP,RRT,BSRT;Noah Tickle, [place], Exercise Physiologist;Maxon Conetta BS, Exercise Physiologist    Virtual Visit No    Medication changes reported     No    Fall or balance concerns reported    No    Warm-up and Cool-down Performed on first and last piece of equipment    Resistance Training Performed Yes    VAD Patient? No    PAD/SET Patient? No      Pain Assessment   Currently in Pain? No/denies                Social History   Tobacco Use  Smoking Status Former   Current packs/day: 0.00   Types: Cigarettes   Quit date: 07/29/1994   Years since quitting: 28.7  Smokeless Tobacco Never    Goals Met:  Independence with exercise equipment Exercise tolerated well No report of concerns or symptoms today Strength training completed today  Goals Unmet:  Not Applicable  Comments: Pt able to follow exercise prescription today without complaint.  Will continue to monitor for progression.    Dr. Bethann Punches is Medical Director for [facility].  Dr. Vida Rigger is Medical Director for [facility] Pulmonary Rehabilitation.

## 2023-05-13 NOTE — Patient Instructions (Signed)
 Discharge Patient Instructions  Patient Details  Name: Rebecca Kirby MRN: [id number] Date of Birth: [birth year] Referring Provider:  Marguarite Arbour, MD   Number of Visits: 26  Reason for Discharge:  Early Exit:  Personal  Smoking History:  Social History   Tobacco Use  Smoking Status Former   Current packs/day: 0.00   Types: Cigarettes   Quit date: 07/29/1994   Years since quitting: 28.8  Smokeless Tobacco Never    Diagnosis:  Post-COVID chronic dyspnea  Initial Exercise Prescription:  Initial Exercise Prescription - 01/14/23 1500       Date of Initial Exercise RX and Referring Provider   Date 01/14/23    Referring Provider Dr. Vida Rigger, MD      Oxygen   Oxygen Continuous    Liters 2    Maintain Oxygen Saturation 88% or higher      Recumbant Bike   Level 1    RPM 50    Watts 15    Minutes 15    METs 1.23      NuStep   Level 1    SPM 80    Minutes 15    METs 1.23      Track   Laps 10    Minutes 15    METs 1.54      Prescription Details   Frequency (times per week) 2    Duration Progress to 30 minutes of continuous aerobic without signs/symptoms of physical distress      Intensity   THRR 40-80% of Max Heartrate 114-132    Ratings of Perceived Exertion 11-13    Perceived Dyspnea 0-4      Progression   Progression Continue to progress workloads to maintain intensity without signs/symptoms of physical distress.      Resistance Training   Training Prescription Yes    Weight 4 lb    Reps 10-15             Discharge Exercise Prescription (Final Exercise Prescription Changes):  Exercise Prescription Changes - 05/07/23 1300       Response to Exercise   Blood Pressure (Admit) 122/58    Blood Pressure (Exit) 110/52    Heart Rate (Admit) 97 bpm    Heart Rate (Exercise) 104 bpm    Heart Rate (Exit) 90 bpm    Oxygen Saturation (Admit) 91 %    Oxygen Saturation (Exercise) 88 %    Oxygen Saturation (Exit) 91 %    Rating of Perceived  Exertion (Exercise) 13    Perceived Dyspnea (Exercise) 2    Symptoms SOB    Duration Continue with 30 min of aerobic exercise without signs/symptoms of physical distress.    Intensity THRR unchanged      Progression   Progression Continue to progress workloads to maintain intensity without signs/symptoms of physical distress.    Average METs 1.78      Resistance Training   Training Prescription Yes    Weight 4 lb    Reps 10-15      Interval Training   Interval Training No      Oxygen   Oxygen Continuous    Liters 0-2      NuStep   Level 3    Minutes 15    METs 2      Track   Laps 8    Minutes 15    METs 1.44      Oxygen   Maintain Oxygen Saturation 88% or higher  Functional Capacity:  6 Minute Walk     Row Name 01/14/23 1520         6 Minute Walk   Phase Initial     Distance 560 feet     Walk Time 4.08 minutes     # of Rest Breaks 1     MPH 1.56     METS 1.23     RPE 13     Perceived Dyspnea  2     VO2 Peak 4.31     Symptoms Yes (comment)     Comments SOB     Resting HR 96 bpm     Resting BP 122/62     Resting Oxygen Saturation  98 %     Exercise Oxygen Saturation  during 6 min walk 91 %     Max Ex. HR 137 bpm     Max Ex. BP 148/66     2 Minute Post BP 136/62       Interval HR   1 Minute HR 114     2 Minute HR 127     3 Minute HR 137     4 Minute HR 117     5 Minute HR 109     6 Minute HR 113     2 Minute Post HR 102     Interval Heart Rate? Yes       Interval Oxygen   Interval Oxygen? Yes     Baseline Oxygen Saturation % 98 %     1 Minute Oxygen Saturation % 91 %     1 Minute Liters of Oxygen 2 L  Pulsed     2 Minute Oxygen Saturation % 91 %     2 Minute Liters of Oxygen 2 L     3 Minute Oxygen Saturation % 92 %     3 Minute Liters of Oxygen 2 L     4 Minute Oxygen Saturation % 92 %     4 Minute Liters of Oxygen 2 L     5 Minute Oxygen Saturation % 96 %     5 Minute Liters of Oxygen 2 L     6 Minute Oxygen  Saturation % 93 %     6 Minute Liters of Oxygen 2 L     2 Minute Post Oxygen Saturation % 98 %     2 Minute Post Liters of Oxygen 2 L             Nutrition & Weight - Outcomes:  Pre Biometrics - 01/14/23 1527       Pre Biometrics   Height 5' 5.5" (1.664 m)    Weight 211 lb (95.7 kg)    Waist Circumference 43.5 inches    Hip Circumference 50 inches    Waist to Hip Ratio 0.87 %    BMI (Calculated) 34.57    Single Leg Stand 2.85 seconds

## 2023-05-16 ENCOUNTER — Ambulatory Visit

## 2023-05-21 ENCOUNTER — Ambulatory Visit

## 2023-05-23 ENCOUNTER — Ambulatory Visit

## 2023-05-28 ENCOUNTER — Ambulatory Visit

## 2023-05-30 ENCOUNTER — Ambulatory Visit

## 2023-06-04 ENCOUNTER — Ambulatory Visit

## 2023-06-05 ENCOUNTER — Encounter: Payer: Self-pay | Admitting: *Deleted

## 2023-06-05 DIAGNOSIS — R0609 Other forms of dyspnea: Secondary | ICD-10-CM

## 2023-06-05 NOTE — Progress Notes (Signed)
 Pulmonary Individual Treatment Plan  Patient Details  Name: Rebecca Kirby MRN: [id number] Date of Birth: 03-27-1945 Referring Provider:   Flowsheet Row Pulmonary Rehab from 01/14/2023 in [facility] Cardiac and Pulmonary Rehab  Referring Provider Dr. Vida Rigger, MD       Initial Encounter Date:  Flowsheet Row Pulmonary Rehab from 01/14/2023 in [facility] Cardiac and Pulmonary Rehab  Date 01/14/23       Visit Diagnosis: Post-COVID chronic dyspnea  Patient's Home Medications on Admission:  Current Outpatient Medications:    Accu-Chek FastClix Lancets MISC, USE 1 EACH FOUR TIMES DAILY. USE AS INSTRUCTED, Disp: , Rfl:    acetaminophen (TYLENOL) 500 MG tablet, Take 500-1,000 mg by mouth 2 (two) times daily., Disp: , Rfl:    acetaminophen (TYLENOL) 500 MG tablet, Take by mouth., Disp: , Rfl:    albuterol (VENTOLIN HFA) 108 (90 Base) MCG/ACT inhaler, Inhale 1 puff into the lungs every 6 (six) hours as needed for wheezing or shortness of breath., Disp: 18 g, Rfl: 0   ALPRAZolam (XANAX) 0.25 MG tablet, Take by mouth., Disp: , Rfl:    B-D UF III MINI PEN NEEDLES 31G X 5 MM MISC, 4 (four) times daily. as directed, Disp: , Rfl:    benzonatate (TESSALON) 200 MG capsule, , Disp: , Rfl:    calcium acetate (PHOSLO) 667 MG capsule, Take 1 capsule by mouth 2 (two) times daily., Disp: , Rfl:    cephALEXin (KEFLEX) 250 MG capsule, , Disp: , Rfl:    chlorpheniramine-HYDROcodone (TUSSIONEX) 10-8 MG/5ML, SHAKE LIQUID AND TAKE 5 ML BY MOUTH EVERY 12 HOURS AS NEEDED FOR COUGH, Disp: , Rfl:    citalopram (CELEXA) 40 MG tablet, Take 40 mg by mouth daily., Disp: , Rfl:    citalopram (CELEXA) 40 MG tablet, Take 1 tablet by mouth daily., Disp: , Rfl:    dexamethasone (DECADRON) 2 MG tablet, TAKE 1 TABLET DAILY WITH BREAKFAST FOR 20 DAYS THEN TAKE 1/2 TABLET DAILY FOR 10 DAYS, Disp: , Rfl:    empagliflozin (JARDIANCE) 10 MG TABS tablet, Take 1 tablet by mouth daily., Disp: , Rfl:    Ensure Max Protein (ENSURE MAX  PROTEIN) LIQD, Take 330 mLs (11 oz total) by mouth 2 (two) times daily between meals. (Patient not taking: Reported on 01/10/2023), Disp: 660 mL, Rfl: 0   Ferrous Fum-Iron Polysacch 53-53 MG CAPS, Take by mouth., Disp: , Rfl:    ferrous sulfate 325 (65 FE) MG tablet, Take 1 tablet (325 mg total) by mouth daily., Disp: 30 tablet, Rfl: 0   fexofenadine (ALLEGRA) 180 MG tablet, Take by mouth., Disp: , Rfl:    fluconazole (DIFLUCAN) 150 MG tablet, Take by mouth., Disp: , Rfl:    fluticasone (FLONASE) 50 MCG/ACT nasal spray, Place 2 sprays into both nostrils daily. (Patient not taking: Reported on 01/10/2023), Disp: , Rfl:    fluticasone (FLONASE) 50 MCG/ACT nasal spray, Place into the nose., Disp: , Rfl:    furosemide (LASIX) 40 MG tablet, TAKE 1 TABLET BY MOUTH ONCE DAILY AS NEEDED FOR EDEMA HOLD IF SYSTOLIC BLOOD PRESSURE<100, Disp: , Rfl:    HUMALOG KWIKPEN 100 UNIT/ML KwikPen, Inject 0.08 mLs (8 Units total) into the skin 3 (three) times daily. Per sliding scale, Disp: 15 mL, Rfl: 11   HYDROcodone-acetaminophen (NORCO) 10-325 MG tablet, Take by mouth., Disp: , Rfl:    ipratropium-albuterol (DUONEB) 0.5-2.5 (3) MG/3ML SOLN, Inhale into the lungs., Disp: , Rfl:    LAGEVRIO 200 MG CAPS capsule, TAKE FOUR CAPSULES BY MOUTH  TWO TIMES A DAY FOR FIVE DAYS (Patient not taking: Reported on 01/10/2023), Disp: , Rfl:    levothyroxine (SYNTHROID) 100 MCG tablet, Take by mouth., Disp: , Rfl:    levothyroxine (SYNTHROID) 125 MCG tablet, Take by mouth., Disp: , Rfl:    levothyroxine (SYNTHROID, LEVOTHROID) 175 MCG tablet, Take 175 mcg by mouth daily before breakfast., Disp: , Rfl:    losartan (COZAAR) 25 MG tablet, Take 1 tablet by mouth at bedtime., Disp: , Rfl:    magnesium oxide (MAG-OX) 400 (240 Mg) MG tablet, , Disp: , Rfl:    magnesium oxide (MAG-OX) 400 MG tablet, Take by mouth., Disp: , Rfl:    metoprolol tartrate (LOPRESSOR) 25 MG tablet, , Disp: , Rfl:    pantoprazole (PROTONIX) 40 MG tablet, Take 40  mg by mouth daily., Disp: , Rfl:    predniSONE (DELTASONE) 10 MG tablet, , Disp: , Rfl:    propranolol (INDERAL) 40 MG tablet, , Disp: , Rfl:    sulfamethoxazole-trimethoprim (BACTRIM) 400-80 MG tablet, TAKE 1 TABLET BY MOUTH DAILY FOR 21 DAYS (Patient not taking: Reported on 01/10/2023), Disp: , Rfl:    TRESIBA FLEXTOUCH 200 UNIT/ML SOPN, Inject 26 Units into the skin every evening., Disp: , Rfl:    verapamil (CALAN-SR) 120 MG CR tablet, Take by mouth., Disp: , Rfl:    vitamin B-12 (CYANOCOBALAMIN) 1000 MCG tablet, Take 1,000 mcg by mouth daily. Reported on 07/07/2015, Disp: , Rfl:   Past Medical History: Past Medical History:  Diagnosis Date   Anemia    Anxiety    Colon cancer (HCC)    Diabetes mellitus without complication (HCC)    Family history of adverse reaction to anesthesia    daughter had reaction to anesthesia and was in icu   GERD (gastroesophageal reflux disease)    Hypertension    Hypothyroidism    Kidney cancer, primary, with metastasis from kidney to other site [facility]) 08/26/2014   right   Pancreas cancer (HCC) 2018   Thyroid cancer (HCC)     Tobacco Use: Social History   Tobacco Use  Smoking Status Former   Current packs/day: 0.00   Types: Cigarettes   Quit date: 07/29/1994   Years since quitting: 28.8  Smokeless Tobacco Never    Labs: Review Flowsheet       Latest Ref Rng & Units 07/29/2014 03/19/2019  Labs for ITP Cardiac and Pulmonary Rehab  Trlycerides <150 mg/dL - 161   Hemoglobin W9U 4.8 - 5.6 % 7.3  7.4      Pulmonary Assessment Scores:  Pulmonary Assessment Scores     Row Name 01/14/23 1516         ADL UCSD   ADL Phase Entry     SOB Score total 81     Rest 1     Walk 3     Stairs 5     Bath 2     Dress 3     Shop 3       CAT Score   CAT Score 23       mMRC Score   mMRC Score 2              UCSD: Self-administered rating of dyspnea associated with activities of daily living (ADLs) 6-point scale (0 = "not at all" to 5 =  "maximal or unable to do because of breathlessness")  Scoring Scores range from 0 to 120.  Minimally important difference is 5 units  CAT: CAT can identify the health  impairment of COPD patients and is better correlated with disease progression.  CAT has a scoring range of zero to 40. The CAT score is classified into four groups of low (less than 10), medium (10 - 20), high (21-30) and very high (31-40) based on the impact level of disease on health status. A CAT score over 10 suggests significant symptoms.  A worsening CAT score could be explained by an exacerbation, poor medication adherence, poor inhaler technique, or progression of COPD or comorbid conditions.  CAT MCID is 2 points  mMRC: mMRC (Modified Medical Research Council) Dyspnea Scale is used to assess the degree of baseline functional disability in patients of respiratory disease due to dyspnea. No minimal important difference is established. A decrease in score of 1 point or greater is considered a positive change.   Pulmonary Function Assessment:  Pulmonary Function Assessment - 01/10/23 0929       Breath   Shortness of Breath Yes;Limiting activity             Exercise Target Goals: Exercise Program Goal: Individual exercise prescription set using results from initial 6 Rebecca walk test and THRR while considering  patient's activity barriers and safety.   Exercise Prescription Goal: Initial exercise prescription builds to 30-45 minutes a day of aerobic activity, 2-3 days per week.  Home exercise guidelines will be given to patient during program as part of exercise prescription that the participant will acknowledge.  Education: Aerobic Exercise: - Group verbal and visual presentation on the components of exercise prescription. Introduces F.I.T.T principle from ACSM for exercise prescriptions.  Reviews F.I.T.T. principles of aerobic exercise including progression. Written material given at graduation.   Education:  Resistance Exercise: - Group verbal and visual presentation on the components of exercise prescription. Introduces F.I.T.T principle from ACSM for exercise prescriptions  Reviews F.I.T.T. principles of resistance exercise including progression. Written material given at graduation.    Education: Exercise & Equipment Safety: - Individual verbal instruction and demonstration of equipment use and safety with use of the equipment. Flowsheet Row Pulmonary Rehab from 01/14/2023 in [facility] Cardiac and Pulmonary Rehab  Date 01/10/23  Educator [facility]  Instruction Review Code 1- Verbalizes Understanding       Education: Exercise Physiology & General Exercise Guidelines: - Group verbal and written instruction with models to review the exercise physiology of the cardiovascular system and associated critical values. Provides general exercise guidelines with specific guidelines to those with heart or lung disease.    Education: Flexibility, Balance, Mind/Body Relaxation: - Group verbal and visual presentation with interactive activity on the components of exercise prescription. Introduces F.I.T.T principle from ACSM for exercise prescriptions. Reviews F.I.T.T. principles of flexibility and balance exercise training including progression. Also discusses the mind body connection.  Reviews various relaxation techniques to help reduce and manage stress (i.e. Deep breathing, progressive muscle relaxation, and visualization). Balance handout provided to take home. Written material given at graduation.   Activity Barriers & Risk Stratification:  Activity Barriers & Cardiac Risk Stratification - 01/14/23 1526       Activity Barriers & Cardiac Risk Stratification   Activity Barriers Left Knee Replacement;Right Knee Replacement;Balance Concerns;History of Falls;Shortness of Breath             6 Minute Walk:  6 Minute Walk     Row Name 01/14/23 1520         6 Minute Walk   Phase Initial     Distance 560  feet     Walk Time 4.08  minutes     # of Rest Breaks 1     MPH 1.56     METS 1.23     RPE 13     Perceived Dyspnea  2     VO2 Peak 4.31     Symptoms Yes (comment)     Comments SOB     Resting HR 96 bpm     Resting BP 122/62     Resting Oxygen Saturation  98 %     Exercise Oxygen Saturation  during 6 Rebecca walk 91 %     Max Ex. HR 137 bpm     Max Ex. BP 148/66     2 Minute Post BP 136/62       Interval HR   1 Minute HR 114     2 Minute HR 127     3 Minute HR 137     4 Minute HR 117     5 Minute HR 109     6 Minute HR 113     2 Minute Post HR 102     Interval Heart Rate? Yes       Interval Oxygen   Interval Oxygen? Yes     Baseline Oxygen Saturation % 98 %     1 Minute Oxygen Saturation % 91 %     1 Minute Liters of Oxygen 2 L  Pulsed     2 Minute Oxygen Saturation % 91 %     2 Minute Liters of Oxygen 2 L     3 Minute Oxygen Saturation % 92 %     3 Minute Liters of Oxygen 2 L     4 Minute Oxygen Saturation % 92 %     4 Minute Liters of Oxygen 2 L     5 Minute Oxygen Saturation % 96 %     5 Minute Liters of Oxygen 2 L     6 Minute Oxygen Saturation % 93 %     6 Minute Liters of Oxygen 2 L     2 Minute Post Oxygen Saturation % 98 %     2 Minute Post Liters of Oxygen 2 L             Oxygen Initial Assessment:  Oxygen Initial Assessment - 01/10/23 0927       Home Oxygen   Home Oxygen Device Home Concentrator;Portable Concentrator    Sleep Oxygen Prescription Continuous    Liters per minute 2    Home Exercise Oxygen Prescription Continuous    Liters per minute 2    Home Resting Oxygen Prescription Continuous    Liters per minute 2    Compliance with Home Oxygen Use Yes      Initial 6 Rebecca Walk   Oxygen Used Continuous;Portable Concentrator    Liters per minute 2      Program Oxygen Prescription   Program Oxygen Prescription Continuous    Liters per minute 2      Intervention   Short Term Goals To learn and exhibit compliance with exercise, home and  travel O2 prescription;To learn and understand importance of monitoring SPO2 with pulse oximeter and demonstrate accurate use of the pulse oximeter.;To learn and understand importance of maintaining oxygen saturations>88%;To learn and demonstrate proper pursed lip breathing techniques or other breathing techniques. ;To learn and demonstrate proper use of respiratory medications    Long  Term Goals Exhibits compliance with exercise, home  and travel O2 prescription;Verbalizes importance of monitoring SPO2 with  pulse oximeter and return demonstration;Maintenance of O2 saturations>88%;Exhibits proper breathing techniques, such as pursed lip breathing or other method taught during program session;Compliance with respiratory medication;Demonstrates proper use of MDI's             Oxygen Re-Evaluation:  Oxygen Re-Evaluation     Row Name 01/17/23 1409 02/21/23 1354 03/21/23 1601         Program Oxygen Prescription   Program Oxygen Prescription -- Continuous Continuous     Liters per minute -- 2 2       Home Oxygen   Home Oxygen Device -- Home Concentrator;Portable Concentrator Home Concentrator;Portable Concentrator     Sleep Oxygen Prescription -- Continuous Continuous     Liters per minute -- 2 2     Home Exercise Oxygen Prescription -- Continuous Continuous     Liters per minute -- 2 2  using 1 in program with good sats     Home Resting Oxygen Prescription -- Continuous Continuous     Liters per minute -- 2 2     Compliance with Home Oxygen Use -- Yes Yes       Goals/Expected Outcomes   Short Term Goals -- To learn and demonstrate proper pursed lip breathing techniques or other breathing techniques.  To learn and demonstrate proper pursed lip breathing techniques or other breathing techniques. ;To learn and exhibit compliance with exercise, home and travel O2 prescription;To learn and understand importance of monitoring SPO2 with pulse oximeter and demonstrate accurate use of the pulse  oximeter.;To learn and understand importance of maintaining oxygen saturations>88%     Long  Term Goals -- Exhibits proper breathing techniques, such as pursed lip breathing or other method taught during program session Exhibits compliance with exercise, home  and travel O2 prescription;Verbalizes importance of monitoring SPO2 with pulse oximeter and return demonstration;Maintenance of O2 saturations>88%;Exhibits proper breathing techniques, such as pursed lip breathing or other method taught during program session     Comments Reviewed PLB technique with pt.  Talked about how it works and it's importance in maintaining their exercise saturations. Informed patient how to perform the Pursed Lipped breathing technique. Told patient to Inhale through the nose and out the mouth with pursed lips to keep their airways open, help oxygenate them better, practice when at rest or doing strenuous activity. Patient Verbalizes understanding of technique and will work on and be reiterated during [facility]. Rebecca Kirby is aware of how to look at her pulse oximetry for readings, she is trying to wean her levels form 2 to 1. She has been able to keep her sats in good range at 1L/Rebecca. USes PLB as needed     Goals/Expected Outcomes Short: Become more profiecient at using PLB. Long: Become independent at using PLB. Short: use PLB with exertion. Long: use PLB on exertion proficiently and independently. STG continue with exercise progression, keeping sats above 88% LTG wean oxygen to lowest level allowed by physician during exertion              Oxygen Discharge (Final Oxygen Re-Evaluation):  Oxygen Re-Evaluation - 03/21/23 1601       Program Oxygen Prescription   Program Oxygen Prescription Continuous    Liters per minute 2      Home Oxygen   Home Oxygen Device Home Concentrator;Portable Concentrator    Sleep Oxygen Prescription Continuous    Liters per minute 2    Home Exercise Oxygen Prescription Continuous     Liters per minute 2   using 1  in program with good sats   Home Resting Oxygen Prescription Continuous    Liters per minute 2    Compliance with Home Oxygen Use Yes      Goals/Expected Outcomes   Short Term Goals To learn and demonstrate proper pursed lip breathing techniques or other breathing techniques. ;To learn and exhibit compliance with exercise, home and travel O2 prescription;To learn and understand importance of monitoring SPO2 with pulse oximeter and demonstrate accurate use of the pulse oximeter.;To learn and understand importance of maintaining oxygen saturations>88%    Long  Term Goals Exhibits compliance with exercise, home  and travel O2 prescription;Verbalizes importance of monitoring SPO2 with pulse oximeter and return demonstration;Maintenance of O2 saturations>88%;Exhibits proper breathing techniques, such as pursed lip breathing or other method taught during program session    Comments Rebecca Kirby is aware of how to look at her pulse oximetry for readings, she is trying to wean her levels form 2 to 1. She has been able to keep her sats in good range at 1L/Rebecca. USes PLB as needed    Goals/Expected Outcomes STG continue with exercise progression, keeping sats above 88% LTG wean oxygen to lowest level allowed by physician during exertion             Initial Exercise Prescription:  Initial Exercise Prescription - 01/14/23 1500       Date of Initial Exercise RX and Referring Provider   Date 01/14/23    Referring Provider Dr. Vida Rigger, MD      Oxygen   Oxygen Continuous    Liters 2    Maintain Oxygen Saturation 88% or higher      Recumbant Bike   Level 1    RPM 50    Watts 15    Minutes 15    METs 1.23      NuStep   Level 1    SPM 80    Minutes 15    METs 1.23      Track   Laps 10    Minutes 15    METs 1.54      Prescription Details   Frequency (times per week) 2    Duration Progress to 30 minutes of continuous aerobic without signs/symptoms of  physical distress      Intensity   THRR 40-80% of Max Heartrate 114-132    Ratings of Perceived Exertion 11-13    Perceived Dyspnea 0-4      Progression   Progression Continue to progress workloads to maintain intensity without signs/symptoms of physical distress.      Resistance Training   Training Prescription Yes    Weight 4 lb    Reps 10-15             Perform Capillary Blood Glucose checks as needed.  Exercise Prescription Changes:   Exercise Prescription Changes     Row Name 01/14/23 1500 01/29/23 1400 02/12/23 1400 02/26/23 0800 03/27/23 0800     Response to Exercise   Blood Pressure (Admit) 122/62 136/68 120/56 124/68 112/64   Blood Pressure (Exercise) 148/66 158/70 154/60 144/72 134/68   Blood Pressure (Exit) 136/62 122/60 136/60 122/54 104/56   Heart Rate (Admit) 96 bpm 91 bpm 87 bpm 96 bpm 90 bpm   Heart Rate (Exercise) 137 bpm 103 bpm 106 bpm 106 bpm 113 bpm   Heart Rate (Exit) 102 bpm 90 bpm 94 bpm 100 bpm 94 bpm   Oxygen Saturation (Admit) 98 % 96 % 96 % 97 % 94 %  Oxygen Saturation (Exercise) 91 % 92 % 91 % 91 % 88 %   Oxygen Saturation (Exit) 98 % 92 % 91 % 94 % 94 %   Rating of Perceived Exertion (Exercise) 13 13 12 14 13    Perceived Dyspnea (Exercise) 2 0 0 0 0   Symptoms SOB SOB SOB SOB SOB   Comments Results First two days of exercise -- -- --   Duration -- Progress to 30 minutes of  aerobic without signs/symptoms of physical distress Continue with 30 Rebecca of aerobic exercise without signs/symptoms of physical distress. Continue with 30 Rebecca of aerobic exercise without signs/symptoms of physical distress. Continue with 30 Rebecca of aerobic exercise without signs/symptoms of physical distress.   Intensity -- THRR unchanged THRR unchanged THRR unchanged THRR unchanged     Progression   Progression -- Continue to progress workloads to maintain intensity without signs/symptoms of physical distress. Continue to progress workloads to maintain intensity  without signs/symptoms of physical distress. Continue to progress workloads to maintain intensity without signs/symptoms of physical distress. Continue to progress workloads to maintain intensity without signs/symptoms of physical distress.   Average METs -- 2.35 2.13 1.83 1.98     Resistance Training   Training Prescription -- Yes Yes Yes Yes   Weight -- 4 lb 4 lb 4 lb 4 lb   Reps -- 10-15 10-15 10-15 10-15     Interval Training   Interval Training -- No No No No     Oxygen   Oxygen -- Continuous Continuous Continuous Continuous   Liters -- 2 0-2 1 1      Recumbant Bike   Level -- 2 1 -- --   Watts -- 18 18 -- --   Minutes -- 15 15 -- --   METs -- 2.6 2.6 -- --     NuStep   Level -- 1 3 2 3    Minutes -- 15 30 30 15    METs -- 2.3 2.1 -- 2.1     Arm Ergometer   Level -- -- -- 1 --   Minutes -- -- -- 15 --   METs -- -- -- 1.7 --     T5 Nustep   Level -- -- -- 1 --   Minutes -- -- -- 15 --   METs -- -- -- 2 --     Track   Laps -- -- -- -- 16   Minutes -- -- -- -- 15   METs -- -- -- -- 1.87     Oxygen   Maintain Oxygen Saturation -- 88% or higher 88% or higher 88% or higher 88% or higher    Row Name 04/09/23 1300 04/22/23 1600 05/07/23 1300         Response to Exercise   Blood Pressure (Admit) 118/54 126/60 122/58     Blood Pressure (Exit) 118/58 110/50 110/52     Heart Rate (Admit) 98 bpm 67 bpm 97 bpm     Heart Rate (Exercise) 110 bpm 115 bpm 104 bpm     Heart Rate (Exit) 101 bpm 93 bpm 90 bpm     Oxygen Saturation (Admit) 97 % 97 % 91 %     Oxygen Saturation (Exercise) 90 % 88 % 88 %     Oxygen Saturation (Exit) 96 % 92 % 91 %     Rating of Perceived Exertion (Exercise) 15 13 13      Perceived Dyspnea (Exercise) 2 1 2      Symptoms SOB none SOB  Duration Continue with 30 Rebecca of aerobic exercise without signs/symptoms of physical distress. Continue with 30 Rebecca of aerobic exercise without signs/symptoms of physical distress. Continue with 30 Rebecca of aerobic  exercise without signs/symptoms of physical distress.     Intensity THRR unchanged THRR unchanged THRR unchanged       Progression   Progression Continue to progress workloads to maintain intensity without signs/symptoms of physical distress. Continue to progress workloads to maintain intensity without signs/symptoms of physical distress. Continue to progress workloads to maintain intensity without signs/symptoms of physical distress.     Average METs 2.04 1.99 1.78       Resistance Training   Training Prescription Yes Yes Yes     Weight 4 lb 4 lb 4 lb     Reps 10-15 10-15 10-15       Interval Training   Interval Training No No No       Oxygen   Oxygen Continuous Continuous Continuous     Liters 0-1 0-2 0-2       NuStep   Level 3 3 3      Minutes 15 15 15      METs 2.2 2.2 2       Track   Laps 22 16  Hallway 8     Minutes 15 15 15      METs 2.2 1.87 1.44       Oxygen   Maintain Oxygen Saturation 88% or higher 88% or higher 88% or higher              Exercise Comments:   Exercise Comments     Row Name 01/17/23 1408           Exercise Comments First full day of exercise!  Patient was oriented to gym and equipment including functions, settings, policies, and procedures.  Patient's individual exercise prescription and treatment plan were reviewed.  All starting workloads were established based on the results of the 6 minute walk test done at initial orientation visit.  The plan for exercise progression was also introduced and progression will be customized based on patient's performance and goals.                Exercise Goals and Review:   Exercise Goals     Row Name 01/14/23 1526             Exercise Goals   Increase Physical Activity Yes       Intervention Provide advice, education, support and counseling about physical activity/exercise needs.;Develop an individualized exercise prescription for aerobic and resistive training based on initial evaluation  findings, risk stratification, comorbidities and participant's personal goals.       Expected Outcomes Short Term: Attend rehab on a regular basis to increase amount of physical activity.;Long Term: Exercising regularly at least 3-5 days a week.;Long Term: Add in home exercise to make exercise part of routine and to increase amount of physical activity.       Increase Strength and Stamina Yes       Intervention Develop an individualized exercise prescription for aerobic and resistive training based on initial evaluation findings, risk stratification, comorbidities and participant's personal goals.;Provide advice, education, support and counseling about physical activity/exercise needs.       Expected Outcomes Short Term: Increase workloads from initial exercise prescription for resistance, speed, and METs.;Short Term: Perform resistance training exercises routinely during rehab and add in resistance training at home;Long Term: Improve cardiorespiratory fitness, muscular endurance and strength as measured by increased  METs and functional capacity ( )       Able to understand and use rate of perceived exertion (RPE) scale Yes       Intervention Provide education and explanation on how to use RPE scale       Expected Outcomes Short Term: Able to use RPE daily in rehab to express subjective intensity level;Long Term:  Able to use RPE to guide intensity level when exercising independently       Able to understand and use Dyspnea scale Yes       Intervention Provide education and explanation on how to use Dyspnea scale       Expected Outcomes Short Term: Able to use Dyspnea scale daily in rehab to express subjective sense of shortness of breath during exertion;Long Term: Able to use Dyspnea scale to guide intensity level when exercising independently       Knowledge and understanding of Target Heart Rate Range (THRR) Yes       Intervention Provide education and explanation of THRR including how the numbers  were predicted and where they are located for reference       Expected Outcomes Short Term: Able to state/look up THRR;Long Term: Able to use THRR to govern intensity when exercising independently;Short Term: Able to use daily as guideline for intensity in rehab       Able to check pulse independently Yes       Intervention Provide education and demonstration on how to check pulse in carotid and radial arteries.;Review the importance of being able to check your own pulse for safety during independent exercise       Expected Outcomes Short Term: Able to explain why pulse checking is important during independent exercise;Long Term: Able to check pulse independently and accurately       Understanding of Exercise Prescription Yes       Intervention Provide education, explanation, and written materials on patient's individual exercise prescription       Expected Outcomes Short Term: Able to explain program exercise prescription;Long Term: Able to explain home exercise prescription to exercise independently                Exercise Goals Re-Evaluation :  Exercise Goals Re-Evaluation     Row Name 01/17/23 1408 01/29/23 1454 02/12/23 1444 02/26/23 0810 03/27/23 0827     Exercise Goal Re-Evaluation   Exercise Goals Review Able to understand and use rate of perceived exertion (RPE) scale;Able to understand and use Dyspnea scale;Knowledge and understanding of Target Heart Rate Range (THRR);Understanding of Exercise Prescription Increase Physical Activity;Increase Strength and Stamina;Understanding of Exercise Prescription Increase Physical Activity;Increase Strength and Stamina;Understanding of Exercise Prescription Increase Physical Activity;Increase Strength and Stamina;Understanding of Exercise Prescription Increase Physical Activity;Increase Strength and Stamina;Understanding of Exercise Prescription   Comments Reviewed RPE and dyspnea scale, THR and program prescription with pt today.  Pt voiced  understanding and was given a copy of goals to take home. Rebecca Kirby is off to a good start in rehab. She has done well on the T4 nustep at level 1 and improved to level 2 on the recumbent bike. She also has done well with 4 lb hand weights for resistance training. She has maintained her O2 saturations above 92% during exercise as well. We will continue to monitor her progress in the program. Rebecca Kirby is doing well in the program. She continues to do well on the T4 nustep and improved to level 3. She also has done well on the recumbent bike at level  1 and continues to use 4 lb hand weights for resistance training. She has not done any walking in the program up to this point. We will continue to monitor her progress in the program. Rebecca Kirby is doing well in the program. She began using the arm crank and T5 nustep and did well at level 1 on each. She has not done any walking in the program up to this point, but we will encourage her to begin doing so. We will continue to monitor her progresss in the program. Rebecca Kirby continues to do well in the program. She was able to increase her level on the T4 nustep from level 2 to 3. She was also able to walk 16 laps on the track in 15 minutes. We will continue to encourage and monitor her progress in the program.   Expected Outcomes Short: Use RPE daily to regulate intensity. Long: Follow program prescription in THR. Short: Continue to follow current exercise prescription. Long: Continue exercise to improve strength and stamina. Short: Begin walking the track in rehab. Long: Continue exercise to improve strength and stamina. Short: Begin walking the track in rehab. Long: Continue exercise to improve strength and stamina. Short: Continue to walk the track and push for more laps. Long: Continue exercise to improvre strength and stamina.    Row Name 04/09/23 1350 04/22/23 1613 05/07/23 1352         Exercise Goal Re-Evaluation   Exercise Goals Review Increase Physical Activity;Increase  Strength and Stamina;Understanding of Exercise Prescription Increase Physical Activity;Increase Strength and Stamina;Understanding of Exercise Prescription Increase Physical Activity;Increase Strength and Stamina;Understanding of Exercise Prescription     Comments Rebecca Kirby is doing well in the program. She continues to do well at level 3 on the T4 nustep. She also increased her walking distance up to 22 laps on the track. She has also been exercising on room air at times and has done well, as her oxygen saturation did not drop below 90% since the last review. We will continue to monitor her progress in the program. Rebecca Kirby is doing well in the program. She has mantained level 3 on the T4 nustep. She switched to the hallway track and walked 16 laps. She is still exercising on room air at times and has done well, as her oxygen saturation did not drop below 88%. We will continue to monitor her progress in the program. Rebecca Kirby is doing well in rehab. She continues to work at level 3 on the T4 nustep. She also has been walking on the track without continuous oxygen, and has done well as her O2 saturation has stayed above 88%. We will continue to monitor her progress in the program.     Expected Outcomes Short: Continue to walk the track and push for more laps. Long: Continue exercise to improvre strength and stamina. Short: Try level 4 on the T4 nustep. Long: Continue exercise to improvre strength and stamina. Short: Continue to push for more laps on the track. Long: Continue exercise to improvre strength and stamina.              Discharge Exercise Prescription (Final Exercise Prescription Changes):  Exercise Prescription Changes - 05/07/23 1300       Response to Exercise   Blood Pressure (Admit) 122/58    Blood Pressure (Exit) 110/52    Heart Rate (Admit) 97 bpm    Heart Rate (Exercise) 104 bpm    Heart Rate (Exit) 90 bpm    Oxygen Saturation (Admit) 91 %  Oxygen Saturation (Exercise) 88 %    Oxygen  Saturation (Exit) 91 %    Rating of Perceived Exertion (Exercise) 13    Perceived Dyspnea (Exercise) 2    Symptoms SOB    Duration Continue with 30 Rebecca of aerobic exercise without signs/symptoms of physical distress.    Intensity THRR unchanged      Progression   Progression Continue to progress workloads to maintain intensity without signs/symptoms of physical distress.    Average METs 1.78      Resistance Training   Training Prescription Yes    Weight 4 lb    Reps 10-15      Interval Training   Interval Training No      Oxygen   Oxygen Continuous    Liters 0-2      NuStep   Level 3    Minutes 15    METs 2      Track   Laps 8    Minutes 15    METs 1.44      Oxygen   Maintain Oxygen Saturation 88% or higher             Nutrition:  Target Goals: Understanding of nutrition guidelines, daily intake of sodium 1500mg , cholesterol 200mg , calories 30% from fat and 7% or less from saturated fats, daily to have 5 or more servings of fruits and vegetables.  Education: All About Nutrition: -Group instruction provided by verbal, written material, interactive activities, discussions, models, and posters to present general guidelines for heart healthy nutrition including fat, fiber, MyPlate, the role of sodium in heart healthy nutrition, utilization of the nutrition label, and utilization of this knowledge for meal planning. Follow up email sent as well. Written material given at graduation.   Biometrics:  Pre Biometrics - 01/14/23 1527       Pre Biometrics   Height 5' 5.5" (1.664 m)    Weight 211 lb (95.7 kg)    Waist Circumference 43.5 inches    Hip Circumference 50 inches    Waist to Hip Ratio 0.87 %    BMI (Calculated) 34.57    Single Leg Stand 2.85 seconds              Nutrition Therapy Plan and Nutrition Goals:  Nutrition Therapy & Goals - 01/14/23 1514       Nutrition Therapy   RD appointment deferred Yes      Intervention Plan   Intervention  Prescribe, educate and counsel regarding individualized specific dietary modifications aiming towards targeted core components such as weight, hypertension, lipid management, diabetes, heart failure and other comorbidities.    Expected Outcomes Short Term Goal: Understand basic principles of dietary content, such as calories, fat, sodium, cholesterol and nutrients.;Short Term Goal: A plan has been developed with personal nutrition goals set during dietitian appointment.;Long Term Goal: Adherence to prescribed nutrition plan.             Nutrition Assessments:  MEDIFICTS Score Key: >=70 Need to make dietary changes  40-70 Heart Healthy Diet <= 40 Therapeutic Level Cholesterol Diet  Flowsheet Row Pulmonary Rehab from 01/14/2023 in [facility] Cardiac and Pulmonary Rehab  Picture Your Plate Total Score on Admission 46      Picture Your Plate Scores: <16 Unhealthy dietary pattern with much room for improvement. 41-50 Dietary pattern unlikely to meet recommendations for good health and room for improvement. 51-60 More healthful dietary pattern, with some room for improvement.  >60 Healthy dietary pattern, although there may be some specific  behaviors that could be improved.   Nutrition Goals Re-Evaluation:  Nutrition Goals Re-Evaluation     Row Name 02/12/23 0931 02/21/23 1353 03/21/23 1611         Goals   Current Weight 208 lb 11.2 oz (94.7 kg) -- --     Nutrition Goal RD appointment deferred -- RD appointment deferred     Comment -- Patient was informed on why it is important to maintain a balanced diet when dealing with Respiratory issues. Explained that it takes a lot of energy to breath and when they are short of breath often they will need to have a good diet to help keep up with the calories they are expending for breathing. COntinues to defer RD visit.     Expected Outcome -- Short: Choose and plan snacks accordingly to patients caloric intake to improve breathing. Long: Maintain a  diet independently that meets their caloric intake to aid in daily shortness of breath. --              Nutrition Goals Discharge (Final Nutrition Goals Re-Evaluation):  Nutrition Goals Re-Evaluation - 03/21/23 1611       Goals   Nutrition Goal RD appointment deferred    Comment COntinues to defer RD visit.             Psychosocial: Target Goals: Acknowledge presence or absence of significant depression and/or stress, maximize coping skills, provide positive support system. Participant is able to verbalize types and ability to use techniques and skills needed for reducing stress and depression.   Education: Stress, Anxiety, and Depression - Group verbal and visual presentation to define topics covered.  Reviews how body is impacted by stress, anxiety, and depression.  Also discusses healthy ways to reduce stress and to treat/manage anxiety and depression.  Written material given at graduation.   Education: Sleep Hygiene -Provides group verbal and written instruction about how sleep can affect your health.  Define sleep hygiene, discuss sleep cycles and impact of sleep habits. Review good sleep hygiene tips.    Initial Review & Psychosocial Screening:  Initial Psych Review & Screening - 01/10/23 0931       Initial Review   Current issues with Current Psychotropic Meds;Current Depression;History of Depression      Family Dynamics   Good Support System? Yes    Comments Rebecca Kirby has a lot of friends, chruch and family that support her. She does take medication for depression. She is ready to get started in the program since her last bout with Covid she has been more short of breath.      Barriers   Psychosocial barriers to participate in program The patient should benefit from training in stress management and relaxation.      Screening Interventions   Interventions Encouraged to exercise;To provide support and resources with identified psychosocial needs;Provide feedback  about the scores to participant    Expected Outcomes Short Term goal: Utilizing psychosocial counselor, staff and physician to assist with identification of specific Stressors or current issues interfering with healing process. Setting desired goal for each stressor or current issue identified.;Long Term Goal: Stressors or current issues are controlled or eliminated.;Short Term goal: Identification and review with participant of any Quality of Life or Depression concerns found by scoring the questionnaire.;Long Term goal: The participant improves quality of Life and PHQ9 Scores as seen by post scores and/or verbalization of changes             Quality of Life Scores:  Scores of 19 and below usually indicate a poorer quality of life in these areas.  A difference of  2-3 points is a clinically meaningful difference.  A difference of 2-3 points in the total score of the Quality of Life Index has been associated with significant improvement in overall quality of life, self-image, physical symptoms, and general health in studies assessing change in quality of life.  PHQ-9: Review Flowsheet       01/14/2023  Depression screen PHQ 2/9  Decreased Interest 0  Down, Depressed, Hopeless 0  PHQ - 2 Score 0  Altered sleeping 2  Tired, decreased energy 0  Change in appetite 0  Feeling bad or failure about yourself  0  Trouble concentrating 1  Moving slowly or fidgety/restless 0  Suicidal thoughts 1  PHQ-9 Score 4  Difficult doing work/chores Somewhat difficult   Interpretation of Total Score  Total Score Depression Severity:  1-4 = Minimal depression, 5-9 = Mild depression, 10-14 = Moderate depression, 15-19 = Moderately severe depression, 20-27 = Severe depression   Psychosocial Evaluation and Intervention:  Psychosocial Evaluation - 01/10/23 0933       Psychosocial Evaluation & Interventions   Interventions Encouraged to exercise with the program and follow exercise prescription;Stress  management education;Relaxation education    Comments Rebecca Kirby has a lot of friends, chruch and family that support her. She does take medication for depression. She is ready to get started in the program since her last bout with Covid she has been more short of breath.    Expected Outcomes Short: Start [facility] to help with mood. Long: Maintain a healthy mental state.    Continue Psychosocial Services  Follow up required by staff             Psychosocial Re-Evaluation:  Psychosocial Re-Evaluation     Row Name 02/12/23 0926 02/21/23 1356 03/21/23 1605         Psychosocial Re-Evaluation   Current issues with None Identified None Identified None Identified     Comments Rebecca Kirby states that she has no current stressors, and states that she is getting good sleep. Her grandson, son and daughter make up her support system, and she can reach out to them when she is dealing with stress. Patient reports no issues with their current mental states, sleep, stress, depression or anxiety. Will follow up with patient in a few weeks for any changes. Continues tkaing meds to prevent depression symptoms, states it helps her not get angry and yell at others. She is wotking on lowering her oxygen needs during exercise and is also working on increasing workloads a tolerated. She is happy to see the changes since she started the program     Expected Outcomes Short: Continue to attend [facility] regularly for regular exercise and social engagement. Long: Continue to improve symptoms and manage a positive mental state. Short: Continue to exercise regularly to support mental health and notify staff of any changes. Long: maintain mental health and well being through teaching of rehab or prescribed medications independently. STG continue with exercise progression and working on weaning her oxygen, continue to take meds as prescribed.     Interventions Encouraged to attend Pulmonary Rehabilitation for the exercise Encouraged to  attend Pulmonary Rehabilitation for the exercise Encouraged to attend Pulmonary Rehabilitation for the exercise     Continue Psychosocial Services  Follow up required by staff Follow up required by staff Follow up required by staff  Psychosocial Discharge (Final Psychosocial Re-Evaluation):  Psychosocial Re-Evaluation - 03/21/23 1605       Psychosocial Re-Evaluation   Current issues with None Identified    Comments Continues tkaing meds to prevent depression symptoms, states it helps her not get angry and yell at others. She is wotking on lowering her oxygen needs during exercise and is also working on increasing workloads a tolerated. She is happy to see the changes since she started the program    Expected Outcomes STG continue with exercise progression and working on weaning her oxygen, continue to take meds as prescribed.    Interventions Encouraged to attend Pulmonary Rehabilitation for the exercise    Continue Psychosocial Services  Follow up required by staff             Education: Education Goals: Education classes will be provided on a weekly basis, covering required topics. Participant will state understanding/return demonstration of topics presented.  Learning Barriers/Preferences:  Learning Barriers/Preferences - 01/10/23 0930       Learning Barriers/Preferences   Learning Barriers Sight    Learning Preferences None             General Pulmonary Education Topics:  Infection Prevention: - Provides verbal and written material to individual with discussion of infection control including proper hand washing and proper equipment cleaning during exercise session. Flowsheet Row Pulmonary Rehab from 01/14/2023 in [facility] Cardiac and Pulmonary Rehab  Date 01/10/23  Educator [facility]  Instruction Review Code 1- Verbalizes Understanding       Falls Prevention: - Provides verbal and written material to individual with discussion of falls prevention and  safety. Flowsheet Row Pulmonary Rehab from 01/14/2023 in [facility] Cardiac and Pulmonary Rehab  Date 01/10/23  Educator [facility]  Instruction Review Code 1- Verbalizes Understanding       Chronic Lung Disease Review: - Group verbal instruction with posters, models, PowerPoint presentations and videos,  to review new updates, new respiratory medications, new advancements in procedures and treatments. Providing information on websites and "800" numbers for continued self-education. Includes information about supplement oxygen, available portable oxygen systems, continuous and intermittent flow rates, oxygen safety, concentrators, and Medicare reimbursement for oxygen. Explanation of Pulmonary Drugs, including class, frequency, complications, importance of spacers, rinsing mouth after steroid MDI's, and proper cleaning methods for nebulizers. Review of basic lung anatomy and physiology related to function, structure, and complications of lung disease. Review of risk factors. Discussion about methods for diagnosing sleep apnea and types of masks and machines for OSA. Includes a review of the use of types of environmental controls: home humidity, furnaces, filters, dust mite/pet prevention, HEPA vacuums. Discussion about weather changes, air quality and the benefits of nasal washing. Instruction on Warning signs, infection symptoms, calling MD promptly, preventive modes, and value of vaccinations. Review of effective airway clearance, coughing and/or vibration techniques. Emphasizing that all should Create an Action Plan. Written material given at graduation. Flowsheet Row Pulmonary Rehab from 01/14/2023 in [facility] Cardiac and Pulmonary Rehab  Education need identified 01/14/23       AED/CPR: - Group verbal and written instruction with the use of models to demonstrate the basic use of the AED with the basic ABC's of resuscitation.    Anatomy and Cardiac Procedures: - Group verbal and visual presentation and  models provide information about basic cardiac anatomy and function. Reviews the testing methods done to diagnose heart disease and the outcomes of the test results. Describes the treatment choices: Medical Management, Angioplasty, or Coronary Bypass Surgery for treating  various heart conditions including Myocardial Infarction, Angina, Valve Disease, and Cardiac Arrhythmias.  Written material given at graduation.   Medication Safety: - Group verbal and visual instruction to review commonly prescribed medications for heart and lung disease. Reviews the medication, class of the drug, and side effects. Includes the steps to properly store meds and maintain the prescription regimen.  Written material given at graduation.   Other: -Provides group and verbal instruction on various topics (see comments)   Knowledge Questionnaire Score:  Knowledge Questionnaire Score - 01/14/23 1516       Knowledge Questionnaire Score   Pre Score 15/18              Core Components/Risk Factors/Patient Goals at Admission:  Personal Goals and Risk Factors at Admission - 01/10/23 0930       Core Components/Risk Factors/Patient Goals on Admission    Weight Management Yes;Weight Maintenance    Intervention Weight Management: Develop a combined nutrition and exercise program designed to reach desired caloric intake, while maintaining appropriate intake of nutrient and fiber, sodium and fats, and appropriate energy expenditure required for the weight goal.;Weight Management: Provide education and appropriate resources to help participant work on and attain dietary goals.;Weight Management/Obesity: Establish reasonable short term and long term weight goals.    Expected Outcomes Short Term: Continue to assess and modify interventions until short term weight is achieved;Long Term: Adherence to nutrition and physical activity/exercise program aimed toward attainment of established weight goal;Weight Maintenance:  Understanding of the daily nutrition guidelines, which includes 25-35% calories from fat, 7% or less cal from saturated fats, less than 200mg  cholesterol, less than 1.5gm of sodium, & 5 or more servings of fruits and vegetables daily;Understanding recommendations for meals to include 15-35% energy as protein, 25-35% energy from fat, 35-60% energy from carbohydrates, less than 200mg  of dietary cholesterol, 20-35 gm of total fiber daily;Understanding of distribution of calorie intake throughout the day with the consumption of 4-5 meals/snacks    Improve shortness of breath with ADL's Yes    Intervention Provide education, individualized exercise plan and daily activity instruction to help decrease symptoms of SOB with activities of daily living.    Expected Outcomes Short Term: Improve cardiorespiratory fitness to achieve a reduction of symptoms when performing ADLs;Long Term: Be able to perform more ADLs without symptoms or delay the onset of symptoms    Diabetes Yes    Intervention Provide education about signs/symptoms and action to take for hypo/hyperglycemia.;Provide education about proper nutrition, including hydration, and aerobic/resistive exercise prescription along with prescribed medications to achieve blood glucose in normal ranges: Fasting glucose 65-99 mg/dL    Expected Outcomes Short Term: Participant verbalizes understanding of the signs/symptoms and immediate care of hyper/hypoglycemia, proper foot care and importance of medication, aerobic/resistive exercise and nutrition plan for blood glucose control.;Long Term: Attainment of HbA1C < 7%.    Hypertension Yes    Intervention Provide education on lifestyle modifcations including regular physical activity/exercise, weight management, moderate sodium restriction and increased consumption of fresh fruit, vegetables, and low fat dairy, alcohol moderation, and smoking cessation.;Monitor prescription use compliance.    Expected Outcomes Short Term:  Continued assessment and intervention until BP is < 140/68mm HG in hypertensive participants. < 130/9mm HG in hypertensive participants with diabetes, heart failure or chronic kidney disease.;Long Term: Maintenance of blood pressure at goal levels.             Education:Diabetes - Individual verbal and written instruction to review signs/symptoms of diabetes, desired ranges of glucose  level fasting, after meals and with exercise. Acknowledge that pre and post exercise glucose checks will be done for 3 sessions at entry of program. Flowsheet Row Pulmonary Rehab from 01/14/2023 in [facility] Cardiac and Pulmonary Rehab  Date 01/10/23  Educator [facility]  Instruction Review Code 1- Verbalizes Understanding       Know Your Numbers and Heart Failure: - Group verbal and visual instruction to discuss disease risk factors for cardiac and pulmonary disease and treatment options.  Reviews associated critical values for Overweight/Obesity, Hypertension, Cholesterol, and Diabetes.  Discusses basics of heart failure: signs/symptoms and treatments.  Introduces Heart Failure Zone chart for action plan for heart failure.  Written material given at graduation. Flowsheet Row Pulmonary Rehab from 01/14/2023 in [facility] Cardiac and Pulmonary Rehab  Education need identified 01/14/23       Core Components/Risk Factors/Patient Goals Review:   Goals and Risk Factor Review     Row Name 02/12/23 0932 02/21/23 1352 03/21/23 1612         Core Components/Risk Factors/Patient Goals Review   Personal Goals Review Hypertension Improve shortness of breath with ADL's Improve shortness of breath with ADL's;Diabetes;Hypertension     Review Rebecca Kirby is taking her blood pressure every other day. She was taking it every day twice a day when she was prescribed new medication but has since gotten a better grasp on her blood pressure medication. Spoke to patient about their shortness of breath and what they can do to improve. Patient has  been informed of breathing techniques when starting the program. Patient is informed to tell staff if they have had any med changes and that certain meds they are taking or not taking can be causing shortness of breath. Is seeing improvement od shortness of breath symptoms with ADLS. Able to go longerwith less shortness off breath.  Gets stopped by her daughter because her HR is elevated to 120's.  DIabetes  A1c in hte 5-6 % range and her blood pressure has been good, still needs to be lower to be in goal range.  122-136/50-60 at cooldown.  She is compliant with all meds.  She has been weaning her oxygen with exertion and has been able to manage with  1 /L Rebecca with good sat results. She is pleased to be at this new level.     Expected Outcomes Short: Continue to take blood pressure regularly. Long: Continue to monitor blood pressure to deal with hypertension. Short: Attend [facility] regularly to improve shortness of breath with ADL's. Long: maintain independence with ADL's STG continue to work on BP results, take meds as prescribed and continue with exercise progression.  LTG all risk factor numbers are in goal range with her nutriton, meds and exercise management              Core Components/Risk Factors/Patient Goals at Discharge (Final Review):   Goals and Risk Factor Review - 03/21/23 1612       Core Components/Risk Factors/Patient Goals Review   Personal Goals Review Improve shortness of breath with ADL's;Diabetes;Hypertension    Review Is seeing improvement od shortness of breath symptoms with ADLS. Able to go longerwith less shortness off breath.  Gets stopped by her daughter because her HR is elevated to 120's.  DIabetes  A1c in hte 5-6 % range and her blood pressure has been good, still needs to be lower to be in goal range.  122-136/50-60 at cooldown.  She is compliant with all meds.  She has been weaning her oxygen with  exertion and has been able to manage with  1 /L Rebecca with good sat  results. She is pleased to be at this new level.    Expected Outcomes STG continue to work on BP results, take meds as prescribed and continue with exercise progression.  LTG all risk factor numbers are in goal range with her nutriton, meds and exercise management             ITP Comments:  ITP Comments     Row Name 01/10/23 0926 01/14/23 1510 01/16/23 0948 01/17/23 1401 02/13/23 1151   ITP Comments Virtual Visit completed. Patient informed on EP and RD appointment and 6 Minute walk test. Patient also informed of patient health questionnaires on My Chart. Patient Verbalizes understanding. Visit diagnosis can be found in [facility] 01/12/2023. Completed and gym orientation. Initial ITP created and sent for review to Dr. Jinny Sanders, Medical Director. 30 Day review completed. Medical Director ITP review done, changes made as directed, and signed approval by Medical Director.    new to program First full day of exercise!  Patient was oriented to gym and equipment including functions, settings, policies, and procedures.  Patient's individual exercise prescription and treatment plan were reviewed.  All starting workloads were established based on the results of the 6 minute walk test done at initial orientation visit.  The plan for exercise progression was also introduced and progression will be customized based on patient's performance and goals. 30 Day review completed. Medical Director ITP review done, changes made as directed, and signed approval by Medical Director.    new to program    Row Name 03/13/23 1303 04/10/23 0829 05/08/23 0827 06/05/23 1421     ITP Comments 30 Day review completed. Medical Director ITP review done, changes made as directed, and signed approval by Medical Director.    new to program 30 Day review completed. Medical Director ITP review done, changes made as directed, and signed approval by Medical Director. 30 Day review completed. Medical Director ITP review done, changes  made as directed, and signed approval by Medical Director. Discharged  3/13 injured /broken ankle             Comments: Discharge ITP

## 2023-06-06 ENCOUNTER — Ambulatory Visit

## 2023-06-11 ENCOUNTER — Ambulatory Visit

## 2023-06-13 ENCOUNTER — Ambulatory Visit

## 2023-06-18 ENCOUNTER — Ambulatory Visit

## 2023-06-20 ENCOUNTER — Ambulatory Visit

## 2023-07-07 ENCOUNTER — Emergency Department

## 2023-07-07 ENCOUNTER — Other Ambulatory Visit: Payer: Self-pay

## 2023-07-07 ENCOUNTER — Encounter: Payer: Self-pay | Admitting: Emergency Medicine

## 2023-07-07 ENCOUNTER — Emergency Department: Admission: EM | Admit: 2023-07-07 | Discharge: 2023-07-07 | Disposition: A | Discharge status: Home / Self Care

## 2023-07-07 DIAGNOSIS — Y9301 Activity, walking, marching and hiking: Secondary | ICD-10-CM | POA: Diagnosis not present

## 2023-07-07 DIAGNOSIS — I10 Essential (primary) hypertension: Secondary | ICD-10-CM | POA: Insufficient documentation

## 2023-07-07 DIAGNOSIS — S0990XA Unspecified injury of head, initial encounter: Secondary | ICD-10-CM | POA: Diagnosis present

## 2023-07-07 DIAGNOSIS — E119 Type 2 diabetes mellitus without complications: Secondary | ICD-10-CM | POA: Insufficient documentation

## 2023-07-07 DIAGNOSIS — M545 Low back pain, unspecified: Secondary | ICD-10-CM | POA: Diagnosis not present

## 2023-07-07 DIAGNOSIS — M546 Pain in thoracic spine: Secondary | ICD-10-CM | POA: Diagnosis not present

## 2023-07-07 DIAGNOSIS — Z96653 Presence of artificial knee joint, bilateral: Secondary | ICD-10-CM | POA: Diagnosis not present

## 2023-07-07 DIAGNOSIS — S0003XA Contusion of scalp, initial encounter: Secondary | ICD-10-CM | POA: Diagnosis not present

## 2023-07-07 DIAGNOSIS — W228XXA Striking against or struck by other objects, initial encounter: Secondary | ICD-10-CM | POA: Insufficient documentation

## 2023-07-07 DIAGNOSIS — Y92009 Unspecified place in unspecified non-institutional (private) residence as the place of occurrence of the external cause: Secondary | ICD-10-CM | POA: Insufficient documentation

## 2023-07-07 DIAGNOSIS — W19XXXA Unspecified fall, initial encounter: Secondary | ICD-10-CM

## 2023-07-07 MED ORDER — ONDANSETRON 4 MG PO TBDP
4.0000 mg | ORAL_TABLET | Freq: Once | ORAL | Status: AC
  Administered 2023-07-07: 4 mg via ORAL
  Filled 2023-07-07: qty 1

## 2023-07-07 MED ORDER — HYDROCODONE-ACETAMINOPHEN 5-325 MG PO TABS
1.0000 | ORAL_TABLET | Freq: Once | ORAL | Status: AC
  Administered 2023-07-07: 1 via ORAL
  Filled 2023-07-07: qty 1

## 2023-07-07 NOTE — ED Provider Notes (Signed)
 [facility] Provider Note    Event Date/Time   First MD Initiated Contact with Patient 07/07/23 1209     (approximate)   History   Fall   HPI  Rebecca Kirby is a 78 y.o. female with PMH of bilateral knee replacements, hypertension, diabetes and recent fracture of left knee, ankle and foot about 8 weeks ago presents for evaluation of a fall.  Patient had an unwitnessed fall at home.  She states she was walking without her walker, turned to sit in the recliner and missed the chair completely landing on her bottom and hitting the top of her head on something.  Patient's family member states she refused to be evaluated at the time that this happened because she did not have any pain.  They did note she had a goose egg on her head at this time.  Later patient began to feel nauseous and could not eat due to the nausea so they brought her to the emergency department.  She reports pain in her lower back denies neck pain, head pain or any pain in her left leg where her previous fractures have been.      Physical Exam   Triage Vital Signs: ED Triage Vitals  Encounter Vitals Group     BP 07/07/23 1205 (!) 105/50     Systolic BP Percentile --      Diastolic BP Percentile --      Pulse Rate 07/07/23 1204 82     Resp 07/07/23 1204 20     Temp 07/07/23 1204 98.4 F (36.9 C)     Temp Source 07/07/23 1204 Oral     SpO2 07/07/23 1204 94 %     Weight 07/07/23 1205 204 lb (92.5 kg)     Height 07/07/23 1205 5' 5.5" (1.664 m)     Head Circumference --      Peak Flow --      Pain Score 07/07/23 1204 10     Pain Loc --      Pain Education --      Exclude from Growth Chart --     Most recent vital signs: Vitals:   07/07/23 1204 07/07/23 1205  BP:  (!) 105/50  Pulse: 82   Resp: 20   Temp: 98.4 F (36.9 C)   SpO2: 94%    General: Awake, no distress.  CV:  Good peripheral perfusion. RRR. Resp:  Normal effort. CTAB. Abd:  No distention.  Other:  TTP over the  lower thoracic spine and lumbar spine, no focal neuro deficits, no ataxia, PERRL, EOM intact   ED Results / Procedures / Treatments   Labs (all labs ordered are listed, but only abnormal results are displayed) Labs Reviewed - No data to display   RADIOLOGY  CT head and x-ray of the lumbar spine obtained, interpreted the images as well as reviewed the radiologist report.  CT head did not show any acute intracranial abnormalities.  There is a small hematoma on the left frontal parietal scalp near the vertex.  Chronic microvascular ischemic changes and small remote lacunar infarct in the left thalamus.  Lumbar x-ray showed mild T12 inferior endplate compression deformity with approximately 30% loss of height that was age-indeterminate.  Osteopenia/osteoporosis.  Levoscoliosis with multilevel degenerative disc disease and facet hypertrophy.  Grade 1 anterolisthesis of L4 on L5 and L5 on S1 that is likely degenerative.   PROCEDURES:  Critical Care performed: No  Procedures   MEDICATIONS ORDERED IN  ED: Medications  ondansetron  (ZOFRAN -ODT) disintegrating tablet 4 mg (4 mg Oral Given 07/07/23 1516)  HYDROcodone -acetaminophen  (NORCO/VICODIN) 5-325 MG per tablet 1 tablet (1 tablet Oral Given 07/07/23 1516)     IMPRESSION / MDM / ASSESSMENT AND PLAN / ED COURSE  I reviewed the triage vital signs and the nursing notes.                             78 year old female presents for evaluation after a fall.  Vital signs are stable patient comfortable on exam as long as she is not moving.  Differential diagnosis includes, but is not limited to, intracranial bleed, skull fracture, scalp hematoma, compression fracture, muscle strain.  Patient's presentation is most consistent with acute complicated illness / injury requiring diagnostic workup.  CT of the head was negative for any acute abnormalities.  Lumbar x-ray shows mild T12 inferior endplate compression deformity with approximately 30% loss  that is age-indeterminate.  Patient denies history of back pain in this area and is tender on exam.  There is possible that this compression fracture occurred today.  I explained to the patient and family that treatment for this is pain control, back brace and neurosurgery follow-up.  Patient's daughter stated that she could not wear a back brace as it would put an uncomfortable amount of pressure on an abdominal hernia and they declined.  Offered to give them referral for neurosurgery, but patient's daughter works at [employer] so she stated she would take care of this.  Patient was given Norco and Zofran  while in the emergency department for pain.  They voiced understanding, all questions were answered and she was stable at discharge.      FINAL CLINICAL IMPRESSION(S) / ED DIAGNOSES   Final diagnoses:  Fall, initial encounter     Rx / DC Orders   ED Discharge Orders     None        Note:  This document was prepared using Dragon voice recognition software and may include unintentional dictation errors.   Phyliss Breen, PA-C 07/07/23 1539    Collis Deaner, MD 07/07/23 1925

## 2023-07-07 NOTE — Discharge Instructions (Addendum)
 Please continue to take the norco as needed for pain. Follow up with neurosurgery if you would like. Information is attached or you can see someone different if you prefer.   Return to the ED with any worsening symptoms.

## 2023-07-07 NOTE — ED Notes (Signed)
 See triage note  Presents with pain to mid/lower back  Per family she got up and fell  Did not use her walker  She presents with boot on left foot and long brace to left leg

## 2023-07-07 NOTE — ED Triage Notes (Signed)
 Pt had unwitnessed fall during the night. Pt presents in brace from broken knee cap, ankle, and foot 8 weeks ago. Pt c/o lower back pain post fall. Pt is not on blood thinners. Family reports she had a "goose egg" on her head prior to arrival but is gone now. Pt took norco at 0545.

## 2023-07-15 ENCOUNTER — Ambulatory Visit
Admission: RE | Admit: 2023-07-15 | Discharge: 2023-07-15 | Disposition: A | Discharge status: Home / Self Care | Source: Ambulatory Visit | Attending: Family Medicine | Admitting: Family Medicine

## 2023-07-15 ENCOUNTER — Other Ambulatory Visit: Payer: Self-pay | Admitting: Family Medicine

## 2023-07-15 DIAGNOSIS — N183 Chronic kidney disease, stage 3 unspecified: Secondary | ICD-10-CM

## 2023-07-15 DIAGNOSIS — K5900 Constipation, unspecified: Secondary | ICD-10-CM | POA: Insufficient documentation

## 2023-07-15 DIAGNOSIS — K436 Other and unspecified ventral hernia with obstruction, without gangrene: Secondary | ICD-10-CM

## 2023-07-15 DIAGNOSIS — C189 Malignant neoplasm of colon, unspecified: Secondary | ICD-10-CM | POA: Diagnosis present

## 2023-07-16 ENCOUNTER — Telehealth: Payer: Self-pay | Admitting: Neurosurgery

## 2023-07-16 NOTE — Telephone Encounter (Signed)
-----   Message from Carroll Clamp sent at 07/09/2023  3:18 PM EDT ----- Regarding: [facility]: APP Timeline: 2-4wks Tests to order: upright xrays thoracolumbar ----- Message ----- From: Phyliss Breen, PA-C Sent: 07/07/2023   3:39 PM EDT To: Carroll Clamp, MD  Hi Dr. Felipe Horton,   This patient came to the ED for evaluation after a fall.  Lumbar x-ray showed age-indeterminate compression fracture with 30% loss at T12.  She was tender in this area on exam.  I offered to put patient in a TSLO brace but patient's daughter did not feel she would be able to wear this due to a large abdominal hernia.  She has tried braces in the past and found it to be quite uncomfortable.  Recommended that they follow-up with you.  Has Norco at home for pain control.  Monda Angry

## 2023-07-16 NOTE — Telephone Encounter (Signed)
 Chantal Comment 07/12/2023 08:25 AM EDT  Left message for patient to call our office back.  ------------------------------------  Margarita Shear F 07/09/2023 03:33 PM EDT  Left message for patient to call our office back.

## 2023-07-17 ENCOUNTER — Other Ambulatory Visit: Payer: Self-pay | Admitting: Family Medicine

## 2023-07-17 ENCOUNTER — Encounter: Payer: Self-pay | Admitting: Family Medicine

## 2023-07-17 DIAGNOSIS — S22080G Wedge compression fracture of T11-T12 vertebra, subsequent encounter for fracture with delayed healing: Secondary | ICD-10-CM

## 2023-07-18 ENCOUNTER — Encounter: Payer: Self-pay | Admitting: Internal Medicine

## 2023-10-03 NOTE — Progress Notes (Signed)
 Foot and Ankle Surgery Patient Visit  Chief Complaint:   Chief Complaint  Patient presents with  . Diabetes    Diabetic footcare    Subjective  HPI  Rebecca Kirby is a 78 y.o. female who presents for Diabetes (Diabetic footcare)  Patient's last A1c was 5.6%. History of Present Illness She is an 78 year old female who presents for a routine podiatry follow-up.  She complains of elongated thickened toenails to both feet that she would like trimmed today.  Her foot has been doing well since the last visit. She mentions a small bruise on her foot from dropping a bottle of shampoo, but it is not painful.  No new symptoms or issues with her feet.  No current medications related to her foot care.  Patient Active Problem List  Diagnosis  . Renal cancer (CMS/HHS-HCC)  . Colon cancer (CMS/HHS-HCC)  . Hypertension  . Fracture, ulna  . History of fracture of wrist  . History of fracture of finger  . Vitamin D  deficiency  . Anemia of chronic disease  . S/P total knee replacement using cement, left  . Thyroid  mass of unclear etiology  . Presence of artificial knee joint  . Postoperative hypothyroidism  . Diabetes mellitus type 2, insulin  dependent (CMS/HHS-HCC)  . Metastatic renal cell carcinoma of pancreas (CMS/HHS-HCC)  . PMB (postmenopausal bleeding)  . Iron  deficiency anemia due to chronic blood loss  . Liver mass  . Other cirrhosis of liver (CMS/HHS-HCC)  . Aortic atherosclerosis ()  . Polyneuropathy associated with underlying disease (CMS/HHS-HCC)  . Type 2 diabetes mellitus with stage 3 chronic kidney disease, with long-term current use of insulin  (CMS/HHS-HCC)  . Lung nodule, solitary  . Statin intolerance  . OSA (obstructive sleep apnea)  . Anemia of chronic renal failure, CKD stage 4  . Malignant neoplasm of upper-outer quadrant of left breast in female, estrogen receptor positive (CMS/HHS-HCC)  . Breast neoplasm, Tis (DCIS), left  . Genetic testing; negative; [employer]  Multi-Cancer Panel  . Hyponatremia  . Renal cell carcinoma (CMS/HHS-HCC)  . Weakness  . PSVT (paroxysmal supraventricular tachycardia) (CMS/HHS-HCC)  . Stage 3 chronic kidney disease (CMS/HHS-HCC)  . Chronic hypoxemic respiratory failure (CMS/HHS-HCC)  . O2 dependent  . Preoperative evaluation to rule out surgical contraindication    Allergies  Allergen Reactions  . Statins-Hmg-Coa Reductase Inhibitors Other (See Comments)    Due to cirrhosis    Social Drivers of Health   Tobacco Use: Medium Risk (10/04/2023)   Patient History   . Smoking Tobacco Use: Former   . Smokeless Tobacco Use: Never   . Passive Exposure: Past  Alcohol Use: Unknown (01/24/2017)   AUDIT-C   . Frequency of Alcohol Consumption: Monthly or less   . Average Number of Drinks: Not on file   . Frequency of Binge Drinking: Never  Financial Resource Strain: Low Risk  (09/19/2023)   Overall Financial Resource Strain (CARDIA)   . Difficulty of Paying Living Expenses: Not hard at all  Food Insecurity: No Food Insecurity (09/19/2023)   Hunger Vital Sign   . Worried About Programme researcher, broadcasting/film/video in the Last Year: Never true   . Ran Out of Food in the Last Year: Never true  Transportation Needs: No Transportation Needs (09/19/2023)   PRAPARE - Transportation   . Lack of Transportation (Medical): No   . Lack of Transportation (Non-Medical): No  Physical Activity: Not on file  Stress: Not on file  Social Connections: Not on file  Depression: Not at risk (  08/23/2023)   PHQ-2   . PHQ-2 Score: 0  Housing Stability: Low Risk  (07/15/2023)   Housing Stability Vital Sign   . Unable to Pay for Housing in the Last Year: No   . Number of Times Moved in the Last Year: 0   . Homeless in the Last Year: No  Utilities: Not At Risk (07/15/2023)   AHC Utilities   . Threatened with loss of utilities: No  Health Literacy: Not on file    Outpatient Medications Prior to Visit  Medication Sig Dispense Refill  . acetaminophen  (TYLENOL )  500 MG tablet Take 500-1,000 mg by mouth as needed for Pain       . ALPRAZolam  (XANAX ) 0.25 MG tablet Take 1 tablet (0.25 mg total) by mouth 2 (two) times daily as needed 60 tablet 1  . calcium  acetate,phosphat bind, (PHOSLO ) 667 mg capsule TAKE 1 CAPSULE BY MOUTH TWICE  DAILY 200 capsule 2  . cholecalciferol  1000 unit tablet Take 1,000 Units by mouth once daily    . citalopram  (CELEXA ) 40 MG tablet Take 1 tablet (40 mg total) by mouth once daily 90 tablet 3  . cyanocobalamin  (VITAMIN B12) 1000 MCG tablet Take 1,000 mcg by mouth once daily    . cyclobenzaprine (FLEXERIL) 5 MG tablet Take 1 tablet (5 mg total) by mouth 3 (three) times daily as needed for Muscle spasms 60 tablet 0  . dicyclomine  (BENTYL ) 10 mg capsule Take 1 capsule (10 mg total) by mouth 2 (two) times daily 60 capsule 11  . dilTIAZem (CARDIZEM) 30 MG immediate release tablet Take 1 tablet (30 mg total) by mouth 4 (four) times daily as needed (HR>110 for >91mins) 120 tablet 11  . ferrous sulfate  325 (65 FE) MG tablet Take 325 mg by mouth daily with breakfast    . fexofenadine (ALLEGRA) 180 MG tablet Take 1 tablet (180 mg total) by mouth once daily as needed 90 tablet 3  . flash glucose sensor (FREESTYLE LIBRE 2 SENSOR) Kit Use 1 kit every 14 (fourteen) days 6 each 1  . fluticasone  propionate (FLONASE ) 50 mcg/actuation nasal spray Place 2 sprays into both nostrils as needed    . FUROsemide  (LASIX ) 40 MG tablet Take 0.5 tablets (20 mg total) by mouth once daily    . HYDROcodone -acetaminophen  (NORCO) 10-325 mg tablet Take 1 tablet by mouth every 6 (six) hours as needed for Pain 100 tablet 0  . insulin  DEGLUDEC (TRESIBA  FLEXTOUCH U-200) pen injector (concentration 200 units/mL) Inject subcutaneously at bedtime Injecting  30-60 units daily.    . insulin  LISPRO (HUMALOG  KWIKPEN INSULIN ) pen injector (concentration 100 units/mL) INJECT UP TO 100 UNITS  SUBCUTANEOUSLY DAILY IN DIVIDED  DOSES FOR SLIDING SCALE 90 mL 3  . JARDIANCE  10 mg tablet  TAKE 1 TABLET(10 MG) BY MOUTH DAILY 90 tablet 3  . levothyroxine  (SYNTHROID ) 200 MCG tablet Take 1 tablet (200 mcg total) by mouth once daily Take on an empty stomach with a glass of water at least 30-60 minutes before breakfast. 90 tablet 3  . losartan  (COZAAR ) 25 MG tablet Take 1 tablet (25 mg total) by mouth once daily 90 tablet 1  . magnesium  oxide (MAG-OX) 400 mg (241.3 mg magnesium ) tablet TAKE 1 TABLET(400 MG) BY MOUTH TWICE DAILY 180 tablet 1  . nystatin  (MYCOSTATIN ) 100,000 unit/gram powder Apply topically 2 (two) times daily 120 g 5  . pantoprazole  (PROTONIX ) 40 MG DR tablet Take 1 tablet (40 mg total) by mouth 2 (two) times daily before meals 180  tablet 3  . pen needle, diabetic (BD ULTRA-FINE MINI PEN NEEDLE) 31 gauge x 3/16 needle USE 4 TIMES DAILY 400 each 3  . lactulose  (ENULOSE ) 10 gram/15 mL oral solution Take 15 mLs by mouth 2 (two) times daily for 10 days 150 mL 0   No facility-administered medications prior to visit.      Objective  Vitals:   10/04/23 1459  BP: 115/66  Weight: 83.9 kg (185 lb)  Height: 165.1 cm (5' 5)  PainSc: 0-No pain   Body mass index is 30.79 kg/m.  Home Vitals:     Physical Exam Physical Exam EXTREMITIES: Pulses are strong. No calluses present.  General/Constitutional: No apparent distress: well-nourished and well developed.   Psych: Normal mood and affect, oriented to person, place and time (AOx3)   Vascular: DP/PT pulses palpable bilateral, capillary fill time intact to digits bilaterally, minimal hair growth present to digits of bilateral lower extremities.  Mild to moderate swelling present to the left lower extremity.  Varicosities present also to bilateral lower extremities.   Neuro: Light touch sensation reduced bilateral lower extremities.   Derm: No open lesions or ulcerations present to bilateral lower extremities.  Nails x 10 thickened, well trimmed, dystrophic and brittle subungual debris. Skin thin to both feet and  slightly atrophic.   MSK: 5/5 muscle strength bilateral lower extremity muscle groups.  Results      Assessment/Plan:    Assessment & Plan Dystrophic nails -Nails x10 debrided reducing the thickness of the toenail and excessive curvature of the significantly thickened/dystrophic/diseased nail with sterile nail nipper.  Patient tolerated procedure well.  Diabetes type 2 with polyneuropathy -Discussed daily foot inspections, proper glycemic control, use of lotions to feet daily, no barefoot walking, and use of supportive shoes at all times.   Diagnoses and all orders for this visit:  Dystrophic nail  Type 2 diabetes mellitus with polyneuropathy (CMS/HHS-HCC)  Pain in toes of both feet    Return in about 3 months (around 01/04/2024) for [facility].  An after visit summary was provided for the patient either in written format (printed) or through MyChart if needed/necessary .  This note has been created using automated tools and reviewed for accuracy by ANDREW MICHAEL BAKER.  Translation done by AI may create words close to the sound of words used at visit, but errors could be present.  Note was reviewed.

## 2023-10-03 NOTE — Progress Notes (Signed)
 Discharge summary  Treatment:   iron  dextran (INFED) 1,500 mg in sodium chloride  0.9% 530 mL IVPB-Volume (mL): 530 & Aranesp  injection   Pre medications: Tylenol , Benadryl , Pepcid, and Solumedrol       IV: IV placed by Sam, RN, ultrasound. Labs: labs drawn via Venipuncture  COMPLICATIONS: No, Patient tolerated infusion well         Pt did not need test dose, last iron  dextran 08/29/23   Care Summary: Specialty focused assessment: Patient arrived to clinic 2A A&Ox4. Treatment plan reviewed. HGB: 8.7, within parameters to receive injection. VSS. IV removed. AVS declined. Education handout declined. Pt declined to stay for observation. Pt instructed to go to the ED or UC if any adverse reactions occur after discharge. Pt states understanding. Pt stable at discharge and left clinic via wheelchair with son in law.  Planned interventions for next visit : continue POC Next appointment on 10/30/23 for iron  dextran, 10/17/23 for aranesp    FUNCTIONAL STATUS  Fall risk: yes  Use of assistive devices: Wheelchair   Delay(s): None  Questionnaire completed and reviewed: yes Allergies reviewed : yes Medication reviewed: yes

## 2023-10-03 NOTE — Progress Notes (Signed)
 [facility] Questionnaire Revised 08/14/2023  What is your treatment today? Hem iron  dextran Is this the first time in the infusion clinic? no What is your pain level today on a scale of 0(no pain) to 10(worst pain ever)? 4 Have you tested positive for Covid in the past 30 days? no  If yes, when?  Have you received your Covid 19 series? yes Females - Is there any chance of pregnancy? no   If no, why?   Are you taking any blood thinners such as Asprin, Plavix, Warfarin, Coumadin, Eliquis, Lovenox , or Pradaxa? no Have you had any noticeable rashes? no Have you had any recent surgeries (within 30 days) or do you have upcoming surgeries within 30 days? no Do you have any open or healing wounds? no If yes, where?  Are you taking antibiotics at this time? no  If yes, for what and how long have you been taking them?  Do you currently or have you recently experienced any cold or flu -like symptoms, such as fever, chills, body aches, cough, or congestion? no Have you taken any Tylenol , Benadryl , Allegra or Zyrtec today? yes Are you driving yourself today? no Is anyone hurting/threatening you or making you feel afraid? no Are there any cultural/religious, spiritual/other issues or concerns we need to know to better care for you during your appointment? no Have you ever had any type of reaction to your infusions? yes   If yes, please explain:  Have you ever had a serious or severe reaction (such as difficulty breathing or swallowing, swelling, rash, or low blood pressure) during or after an infusion? no  If yes, please describe what happened: Is there anything we should know today to help keep you safe during your infusion - such as past allergic reactions, new symptoms, or recent illness? no

## 2023-10-09 NOTE — Progress Notes (Signed)
 Rebecca Kirby is a  78 y.o. female who presents for  CHIEF COMPLAINT Chief Complaint  Patient presents with  . Annual Exam    Subjective: History of Present Illness  Pt in NAD. Here for yearly eval. Followed closely at [facility] with extensive hx of metastatic CA and IDA. HTN stable on meds. Has DM on insulin  and thyroid  dz on Synthroid . Losing weight. Not eating well. Weak. Not ambulating. Having back pain. Having panic attacks with shaking and SOB. Some palpitations. Some atypical CP. Constipated. Using stool softeners.    Past Medical History:  Diagnosis Date  . Anesthesia complication    post op atrial fib   . Anxiety   . Aortic atherosclerosis ()    on CT imaging (2019)  . Atrial fibrillation (CMS/HHS-HCC) found during a surgery  . 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 Cancer of right kidney (CMS/HHS-HCC) 03/2006  . Cholecystitis 4 years ago   removed gallbladder  . Cirrhosis (CMS/HHS-HCC)   . Colon cancer (CMS/HHS-HCC)    status post colectomy  . History of blood transfusion 03/29/2006   due to anemia  . History of fracture of finger    Metatarsal fracture  . History of fracture of wrist    questionable ulnar styloid fracture  . History of radiation therapy   . Hypertension   . Obesity (BMI 30-39.9), unspecified 07/17/2019   BMI 35  . Osteoarthritis of both knees    status post Synvisc  . Osteoporosis    vertebral compression fracture s/p kyphoplasty 07/2023  . Pancreatitis (HHS-HCC) 06/15/2016   found on ultrasound  . Pneumonia due to COVID-19 virus 02/2019   Hospitalization at [facility]  . Post-surgical hypothyroidism   . Renal cancer (CMS/HHS-HCC)    status post nephrectomy  . Stage 3 chronic kidney disease (CMS/HHS-HCC)   . Type 2 diabetes mellitus (CMS/HHS-HCC)    Patient Active Problem List   Diagnosis  . Renal cancer (CMS/HHS-HCC)  . Colon cancer (CMS/HHS-HCC)  . Hypertension  . Fracture, ulna  . History of fracture of wrist  . History of fracture of finger  . Vitamin D  deficiency  . Anemia of chronic disease  . S/P total knee replacement using cement, left  . Thyroid  mass of unclear etiology  . Presence of artificial knee joint  . Postoperative hypothyroidism  . Diabetes mellitus type 2, insulin  dependent (CMS/HHS-HCC)  . Metastatic renal cell carcinoma of pancreas (CMS/HHS-HCC)  . PMB (postmenopausal bleeding)  . Iron  deficiency anemia due to chronic blood loss  . Liver mass  . Other cirrhosis of liver (CMS/HHS-HCC)  . Aortic atherosclerosis ()  . Polyneuropathy associated with underlying disease (CMS/HHS-HCC)  . Type 2 diabetes mellitus with stage 3 chronic kidney disease, with long-term current use of insulin  (CMS/HHS-HCC)  . Lung nodule, solitary  . Statin intolerance  . OSA (obstructive sleep apnea)  . Anemia of chronic renal failure, CKD stage 4  . Malignant neoplasm of upper-outer quadrant of left breast in female, estrogen receptor positive (CMS/HHS-HCC)  . Breast neoplasm, Tis (DCIS), left  . Genetic testing; negative; [employer] Multi-Cancer Panel  . Hyponatremia  . Renal cell carcinoma (CMS/HHS-HCC)  . Weakness  . PSVT (paroxysmal supraventricular tachycardia) (CMS/HHS-HCC)  . Stage 3 chronic kidney disease (CMS/HHS-HCC)  . Chronic hypoxemic respiratory failure (CMS/HHS-HCC)  . O2 dependent  . Preoperative evaluation to rule  out surgical contraindication    Past Surgical History:  Procedure Laterality Date  . ARTHROSCOPY KNEE Right 06/2000  . NEPHRECTOMY Right 2008   AND LAR  . COLONOSCOPY  04/25/2006   Adenocarcinoma  . COLONOSCOPY  01/03/2007   [facility]  . COLONOSCOPY  01/24/2009   Adenomatous Polyps, [facility]  . COLONOSCOPY  05/17/2010   [facility], PH Adenomatous Polyps  . CHOLECYSTECTOMY  05/2010  . Right total knee arthroplasty using  computer-assisted navigation  07/17/2010   Dr Mardee  . COLONOSCOPY  06/30/2012   Adenomatous Polyp, [facility]  . THYROIDECTOMY TOTAL  11/2013  . COLONOSCOPY  12/25/2013   [facility], PH Adenomatous Polyps: CBF 11/2015  . EGD  12/25/2013   04/25/2006; No repeat per RTE (dw)  . Left total knee arthroplasty using computer-assisted navigation  08/09/2014   Dr Mardee  . COLONOSCOPY  09/01/2015   Adenomatous Polyp, [facility]: CBF 08/2020  . EGD  09/01/2015   Gastritis: No repeat per RTE  . LAPAROSCOPY DIAGNOSTIC N/A 10/01/2016   Procedure: LAPAROSCOPY, SURGICAL; ABDOMEN, PERITONEUM, AND OMENTUM, DIAGNOSTIC, WITH OR WITHOUT COLLECTION OF SPECIMEN(S) BY BRUSHING OR WASHING (SEPARATE PROCEDURE);  Surgeon: Franky Gildardo Fairly, MD;  Location[facility] OR;  Service: General Surgery;  Laterality: N/A;  . PANCREATICODUODENECTOMY W/PANCREATICOJEJUNOSTOMY WHIPPLE N/A 10/01/2016   Procedure: PANCREATECTOMY, PROXIMAL SUBTOTAL WITH TOTAL DUODENECTOMY, PARTIAL GASTRECTOMY, CHOLEDOCHOENTEROSTOMY AND GASTROJEJUNOSTOMY (WHIPPLE-TYPE PROCEDURE); WITH PANCREATOJEJUNOSTOMY;  Surgeon: Franky Gildardo Fairly, MD;  Location: [facility] OR;  Service: General Surgery;  Laterality: N/A;  . LAPAROSCOPIC TOTAL HYSTERECTOMY  12/2016   Dr Charletta Ward  . HYSTERECTOMY  01/04/2017  . COLONOSCOPY N/A 03/25/2017   Procedure: Colonoscopy;  Surgeon: Lars Lupita Ambrosia, MD;  [facility]: [facility];  Service: Gastroenterology;  Laterality: N/A;  Can be done by any provider, does not have to be in a Berg template  . EGD N/A 04/29/2017   Procedure: EGD;  Surgeon: Lars Lupita Ambrosia, MD;  Location: [facility];  Service: Gastroenterology;  Laterality: N/A;  Please schedule in a Berg template in next 1-2 months.  Rebecca Kirby W/REMOVAL LESION BY SNARE N/A 09/08/2018   Procedure: EGD;  Surgeon: Burbridge, Asberry Caldron, MD;  Location: [facility];  Service: Gastroenterology;  Laterality: N/A;  2mL of epi  injected  . ESOPHAGOGASTRODOUDENOSCOPY W/INJECTION N/A 09/08/2018   Procedure: ESOPHAGOGASTRODUODENOSCOPY, FLEXIBLE, TRANSORAL; WITH DIRECTED SUBMUCOSAL INJECTION(S), ANY SUBSTANCE;  Surgeon: Burbridge, Asberry Caldron, MD;  Location: [facility];  Service: Gastroenterology;  Laterality: N/A;  . PERCUTANEOUS BIOPSY BREAST Left 07/08/2019   07/08/2019: Left breast stereotactic core needle biopsy of calcifications at 6:00 (buckle clip placed/verified) - ductal carcinoma in situ, grade 3, ER+   . MASTECTOMY PARTIAL Left 08/04/2019   Procedure: Left MASTECTOMY, PARTIAL;  Surgeon: Dora Delon Murray, MD;  Location: [facility];  Service: General Surgery;  Laterality: Left;  . BIOPSY/EXCISION LYMPH NODE AXILLARY Left 08/18/2019   Procedure: BIOPSY OR EXCISION OF LYMPH NODE(S); OPEN, DEEP AXILLARY NODE(S);  Surgeon: Dora Delon Murray, MD;  Location: [facility];  Service: General Surgery;  Laterality: Left;  . ESOPHAGOGASTRODOUDENOSCOPY W/CONTROL BLEEDING N/A 09/05/2020   Procedure: Upper Endoscopy;  Surgeon: Louann Alm Hora, MD;  Location: [facility];  Service: Gastroenterology;  Laterality: N/A;  . COLONOSCOPY W/CONTROL BLEEDING N/A 09/05/2020   Procedure: Colonoscopy;  Surgeon: Louann Alm Hora, MD;  Location: [facility];  Service: Gastroenterology;  Laterality: N/A;  . SMALL INTESTINE ENDOSCOPY W/REMOVAL LESION BY SNARE N/A 05/15/2023   Procedure: SMALL INTESTINAL ENDOSCOPY, ENTEROSCOPY BEYOND SECOND PORTION  OF DUODENUM, NOT INCLUDING ILEUM; WITH REMOVAL OF TUMOR(S), POLYP(S), OR OTHER LESION(S) BY SNARE TECHNIQUE;  Surgeon: Pioppo Phelan, Tinnie Brunet, MD;  Location: [facility];  Service: Gastroenterology;  Laterality: N/A;  . SMALL INTESTINE ENDOSCOPY W/CONTROL BLEEDING N/A 05/15/2023   Procedure: SMALL INTESTINAL ENDOSCOPY, ENTEROSCOPY BEYOND SECOND PORTION OF DUODENUM, NOT INCL ILEUM; W/ CONTROL BLEEDING (EG, INJ, BIPOLAR, UNIPOLAR CAUTERY, LASER, HEATER  PROBE, STAPLER, PLASMA COAGULATOR);  Surgeon: Pioppo Phelan, Tinnie Brunet, MD;  Location: [facility];  Service: Gastroenterology;  Laterality: N/A;  . ESOPHAGOGASTRODOUDENOSCOPY W/BAND LIGATION VARICIES N/A 05/15/2023   Procedure: ESOPHAGOGASTRODUODENOSCOPY, FLEXIBLE, TRANSORAL; WITH BAND LIGATION OF ESOPHAGEAL/GASTRIC VARICES;  Surgeon: Pioppo Phelan, Tinnie Brunet, MD;  Location: [facility];  Service: Gastroenterology;  Laterality: N/A;  . SMALL INTESTINE ENDOSCOPY W/ABLATION LESION N/A 05/15/2023   Procedure: SMALL INTESTINAL ENDOSCOPY, ENTEROSCOPY BEYOND SECOND PORTION OF DUODENUM, NOT INCL ILEUM; W/ ABLATION OF TUMOR, POLYP/OTHER LESION NOT AMENABLE REMOVAL BY HOT BIOPSY FORCEPS, BIPOLAR/SNARE TECHNIQUE;  Surgeon: Pioppo Phelan, Tinnie Brunet, MD;  Location: [facility];  Service: Gastroenterology;  Laterality: N/A;  . ESOPHAGOGASTRODOUDENOSCOPY W/BAND LIGATION VARICIES N/A 09/03/2023   Procedure: EGD - Upper Endoscopy;  Surgeon: Gaile Claretta Denmark, MD;  Location: [facility];  Service: Gastroenterology;  Laterality: N/A;  . BREAST BIOPSY    . BREAST LUMPECTOMY Left   . COLON SURGERY  03/2006   colon cancer  . JOINT REPLACEMENT  3years ago   left knee and rt knee done 10 + years ago  . LYMPH NODE BIOPSY  breast biopsy  . THYMECTOMY       Current Outpatient Medications:  .  acetaminophen  (TYLENOL ) 500 MG tablet, Take 500-1,000 mg by mouth as needed for Pain   , Disp: , Rfl:  .  ALPRAZolam  (XANAX ) 0.25 MG tablet, Take 1 tablet (0.25 mg total) by mouth 2 (two) times daily as needed, Disp: 60 tablet, Rfl: 1 .  calcium  acetate,phosphat bind, (PHOSLO ) 667 mg capsule, TAKE 1 CAPSULE BY MOUTH TWICE  DAILY, Disp: 200 capsule, Rfl: 2 .  cholecalciferol  1000 unit tablet, Take 1,000 Units by mouth once daily, Disp: , Rfl:  .  citalopram  (CELEXA ) 40 MG tablet, Take 1 tablet (40 mg total) by mouth once daily, Disp: 90 tablet, Rfl: 3 .  cyanocobalamin  (VITAMIN  B12) 1000 MCG tablet, Take 1,000 mcg by mouth once daily, Disp: , Rfl:  .  cyclobenzaprine (FLEXERIL) 5 MG tablet, Take 1 tablet (5 mg total) by mouth 3 (three) times daily as needed for Muscle spasms, Disp: 60 tablet, Rfl: 0 .  dicyclomine  (BENTYL ) 10 mg capsule, Take 1 capsule (10 mg total) by mouth 2 (two) times daily, Disp: 60 capsule, Rfl: 11 .  dilTIAZem (CARDIZEM) 30 MG immediate release tablet, Take 1 tablet (30 mg total) by mouth 4 (four) times daily as needed (HR>110 for >54mins), Disp: 120 tablet, Rfl: 11 .  ferrous sulfate  325 (65 FE) MG tablet, Take 325 mg by mouth daily with breakfast, Disp: , Rfl:  .  fexofenadine (ALLEGRA) 180 MG tablet, Take 1 tablet (180 mg total) by mouth once daily as needed, Disp: 90 tablet, Rfl: 3 .  flash glucose sensor (FREESTYLE LIBRE 2 SENSOR) Kit, Use 1 kit every 14 (fourteen) days, Disp: 6 each, Rfl: 1 .  fluticasone  propionate (FLONASE ) 50 mcg/actuation nasal spray, Place 2 sprays into both nostrils as needed, Disp: , Rfl:  .  FUROsemide  (LASIX ) 40 MG tablet, Take 0.5 tablets (20 mg total) by mouth once daily, Disp: ,  Rfl:  .  HYDROcodone -acetaminophen  (NORCO) 10-325 mg tablet, Take 1 tablet by mouth every 6 (six) hours as needed for Pain, Disp: 100 tablet, Rfl: 0 .  insulin  DEGLUDEC (TRESIBA  FLEXTOUCH U-200) pen injector (concentration 200 units/mL), Inject subcutaneously at bedtime Injecting  30-60 units daily., Disp: , Rfl:  .  insulin  LISPRO (HUMALOG  KWIKPEN INSULIN ) pen injector (concentration 100 units/mL), INJECT UP TO 100 UNITS  SUBCUTANEOUSLY DAILY IN DIVIDED  DOSES FOR SLIDING SCALE, Disp: 90 mL, Rfl: 3 .  JARDIANCE  10 mg tablet, TAKE 1 TABLET(10 MG) BY MOUTH DAILY, Disp: 90 tablet, Rfl: 3 .  lactulose  (ENULOSE ) 10 gram/15 mL oral solution, Take 15 mLs by mouth 2 (two) times daily for 10 days, Disp: 150 mL, Rfl: 0 .  levothyroxine  (SYNTHROID ) 200 MCG tablet, Take 1 tablet (200 mcg total) by mouth once daily Take on an empty stomach with a glass of  water at least 30-60 minutes before breakfast., Disp: 90 tablet, Rfl: 3 .  losartan  (COZAAR ) 25 MG tablet, Take 1 tablet (25 mg total) by mouth once daily, Disp: 90 tablet, Rfl: 1 .  magnesium  oxide (MAG-OX) 400 mg (241.3 mg magnesium ) tablet, TAKE 1 TABLET(400 MG) BY MOUTH TWICE DAILY, Disp: 180 tablet, Rfl: 1 .  nystatin  (MYCOSTATIN ) 100,000 unit/gram powder, Apply topically 2 (two) times daily, Disp: 120 g, Rfl: 5 .  pantoprazole  (PROTONIX ) 40 MG DR tablet, Take 1 tablet (40 mg total) by mouth 2 (two) times daily before meals, Disp: 180 tablet, Rfl: 3 .  pen needle, diabetic (BD ULTRA-FINE MINI PEN NEEDLE) 31 gauge x 3/16 needle, USE 4 TIMES DAILY, Disp: 400 each, Rfl: 3  Statins-hmg-coa reductase inhibitors  Social History   Socioeconomic History  . Marital status: Widowed  . Number of children: 2  . Years of education: 13  Occupational History  . Occupation: Retired- Copywriter, advertising at [employer]  . Smoking status: Former    Current packs/day: 0.00    Average packs/day: 0.7 packs/day for 27.0 years (20.0 ttl pk-yrs)    Types: Cigarettes    Start date: 07/14/1984    Quit date: 07/15/1994    Years since quitting: 29.2    Passive exposure: Past  . Smokeless tobacco: Never  Vaping Use  . Vaping status: Never Used  Substance and Sexual Activity  . Alcohol use: Never  . Drug use: Never  . Sexual activity: Not Currently    Partners: Male    Birth control/protection: Surgical  Social History Narrative   *Last updated 07/17/2019.   Preferred pronouns: She/Her/Hers   Widowed. 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 Difficulty of Paying Living Expenses: Not hard at all  Food Insecurity: No Food Insecurity (10/09/2023)   Hunger Vital Sign   . Worried About Programme researcher, broadcasting/film/video in the Last Year: Never true   . Ran Out of Food in the Last Year: Never true  Transportation Needs: No Transportation Needs (10/09/2023)   PRAPARE - Transportation   . Lack of Transportation (Medical): No   . Lack of Transportation (Non-Medical): No  Housing  Stability: Low Risk  (10/09/2023)   Housing Stability Vital Sign   . Unable to Pay for Housing in the Last Year: No   . Number of Times Moved in the Last Year: 0   . Homeless in the Last Year: No    Family History  Problem Relation Name Age of Onset  . Diabetes type II Mother mom   . High blood pressure (Hypertension) Mother mom   . Myocardial Infarction (Heart attack) Mother mom   . Stroke Mother mom   . Asthma Father dad   . High blood pressure (Hypertension) Father dad   . No Known Problems Son    . Anesthesia problems Daughter Arland Berlin        Anaphylactic reaction to Fentanyl .   . Colon cancer Cousin    . Colon cancer Cousin    . Breast cancer Cousin    . Pulmonary embolism Cousin    . Breast cancer Maternal Aunt Nannie     A comprehensive ROS was negative except for HPI  PE: BP 116/62   Pulse 87   Ht 165.1 cm (5' 5)   Wt 83 kg (183 lb)   SpO2 96%   BMI 30.45 kg/m  General: Alert oriented x3   Eyes: Sclera and conjunctiva clear; pupils equal round and reactive to light and accommodation; extraocular movements inta Nose: Mucosa healthy without drainage or ulceration Oropharynx: No suspicious lesions Neck: No swelling, masses, stiffness, pain, limited movement, carotid pulses normal bilaterally, thyroid  normal size, no masses palpated. No bruits heard. Lungs: Respirations unlabored; clear to auscultation bilaterally Back: No spinal deformity Cardiovascular: Heart regular rate and rhythm without murmurs,  gallops, or rubs Abdomen: Soft; non tender; non distended;  no masses or organomegaly Lymph Nodes: No significant cervical, supraclavicular, or axillary lymphadenopathy noted Musculoskeletal: No active joint inflammation Extremities: Normal, no edema Pulses: Dorsalis pedis palpable and symmetric bilaterally Neurologic: Alert and oriented; speech intact; face symmetrical; moves all extremities well    Hospital Outpatient Visit on 10/03/2023  Component Date Value Ref Range Status  . WBC (White Blood Cell Count) 10/03/2023 10.0 (H)  3.2 - 9.8 x10^9/L Final  . Hemoglobin 10/03/2023 8.7 (L)  11.7 - 15.5 g/dL Final  . Hematocrit 91/92/7974 27.6 (L)  35.0 - 45.0 % Final  . Platelets 10/03/2023 207  150 - 450 x10^9/L Final  . MCV (Mean Corpuscular Volume) 10/03/2023 87  80 - 98 fL Final  . MCH (Mean Corpuscular Hemoglobin) 10/03/2023 27.3  26.5 - 34.0 pg Final  . MCHC (Mean Corpuscular Hemoglobin * 10/03/2023 31.5  31.0 - 36.0 % Final  . RBC (Red Blood Cell Count) 10/03/2023 3.19 (L)  3.77 - 5.16 x10^12/L Final  . RDW-CV (Red Cell Distribution Widt* 10/03/2023 20.4 (H)  11.5 - 14.5 % Final  . NRBC (Nucleated Red Blood Cell Cou* 10/03/2023 0.00  0 x10^9/L Final  . NRBC % (Nucleated Red Blood Cell %) 10/03/2023 0.0  % Final  . MPV (Mean Platelet Volume) 10/03/2023 11.2  7.2 - 11.7 fL Final  . Neutrophil Count 10/03/2023 8.3  2.0 - 8.6 x10^9/L Final  . Neutrophil % 10/03/2023 82.9 (H)  37 - 80 % Final  . Lymphocyte Count 10/03/2023 0.9  0.6 - 4.2 x10^9/L Final  . Lymphocyte % 10/03/2023 8.5 (L)  10 - 50 % Final  . Monocyte Count 10/03/2023 0.7  0 - 0.9 x10^9/L Final  . Monocyte % 10/03/2023 7.0  0 - 12 % Final  . Eosinophil Count 10/03/2023 0.08  0 - 0.70 x10^9/L Final  . Eosinophil % 10/03/2023 0.8  0 - 7 % Final  . Basophil Count 10/03/2023 0.04  0 - 0.20 x10^9/L Final  . Basophil % 10/03/2023 0.4  0 - 2 % Final  . Slide Review/Morphology 10/03/2023 Yes   Final  . Immature Granulocyte Count  10/03/2023 0.04  <=0.06 x10^9/L Final  . Immature Granulocyte % 10/03/2023 0.4  <=0.7 % Final  . Immature Platelet Fraction 10/03/2023 2.9  1.6 - 8.5 % Final  . Reticulocyte % 10/03/2023 1.72  0.70 - 2.10 % Final  . Reticulocyte Count /L 10/03/2023 54.9  28.0 - 134.0 x10^9/L Final  . Reticulocyte-He 10/03/2023 24.5 (L)  29.2 - 38.8 pg Final  . Immature Reticulocyte Fraction 10/03/2023 16.4 (H)  3.1 - 16.0 % Final  Hospital Outpatient Visit on 09/19/2023  Component Date Value Ref Range Status  . Sodium 09/19/2023 138  135 - 145 mmol/L Final  . Potassium 09/19/2023 4.7  3.5 - 5.0 mmol/L Final  . Chloride 09/19/2023 105  98 - 108 mmol/L Final  . Carbon Dioxide (CO2) 09/19/2023 26  21 - 30 mmol/L Final  . Urea Nitrogen (BUN) 09/19/2023 24 (H)  7 - 20 mg/dL Final  . Creatinine 92/75/7974 1.5 (H)  0.4 - 1.0 mg/dL Final  . Glucose 92/75/7974 166 (H)  70 - 140 mg/dL Final  . Calcium  09/19/2023 8.6 (L)  8.7 - 10.2 mg/dL Final  . AST (Aspartate Aminotransferase) 09/19/2023 28  15 - 41 U/L Final  . ALT (Alanine Aminotransferase) 09/19/2023 15  10 - 39 U/L Final  . Bilirubin, Total 09/19/2023 0.7  0.4 - 1.5 mg/dL Final  . Alk Phos (Alkaline Phosphatase) 09/19/2023 106  24 - 110 U/L Final  . Albumin  09/19/2023 3.0 (L)  3.5 - 4.8 g/dL Final  . Protein, Total 09/19/2023 6.4  6.2 - 8.1 g/dL Final  . Anion Gap 92/75/7974 7  3 - 12 mmol/L Final  . BUN/CREA Ratio 09/19/2023 16  6 - 27 Final  . Glomerular Filtration Rate (eGFR)  09/19/2023 35  mL/min/1.73sq m Final  . WBC (White Blood Cell Count) 09/19/2023 8.2  3.2 - 9.8 x10^9/L Final  . Hemoglobin 09/19/2023 9.4 (L)  11.7 - 15.5 g/dL Final  . Hematocrit 92/75/7974 31.1 (L)  35.0 - 45.0 % Final  . Platelets 09/19/2023 196  150 - 450 x10^9/L Final  . MCV (Mean Corpuscular Volume) 09/19/2023 88  80 - 98 fL Final  . MCH (Mean Corpuscular Hemoglobin) 09/19/2023 26.5  26.5 - 34.0 pg Final  . MCHC (Mean Corpuscular Hemoglobin * 09/19/2023 30.2 (L)  31.0 -  36.0 % Final  . RBC (Red Blood Cell Count) 09/19/2023 3.55 (L)  3.77 - 5.16 x10^12/L Final  . RDW-CV (Red Cell Distribution Widt* 09/19/2023 22.5 (H)  11.5 - 14.5 % Final  . NRBC (Nucleated Red Blood Cell Cou* 09/19/2023 0.00  0 x10^9/L Final  . NRBC % (Nucleated Red Blood Cell %) 09/19/2023 0.0  % Final  . MPV (Mean Platelet Volume) 09/19/2023 11.5  7.2 - 11.7 fL Final  . Neutrophil Count 09/19/2023 6.7  2.0 - 8.6 x10^9/L Final  . Neutrophil % 09/19/2023 82.5 (H)  37 - 80 % Final  . Lymphocyte Count 09/19/2023 0.7  0.6 - 4.2 x10^9/L Final  . Lymphocyte % 09/19/2023 8.8 (L)  10 - 50 % Final  . Monocyte Count 09/19/2023 0.5  0 - 0.9 x10^9/L Final  . Monocyte % 09/19/2023 6.4  0 - 12 %  Final  . Eosinophil Count 09/19/2023 0.10  0 - 0.70 x10^9/L Final  . Eosinophil % 09/19/2023 1.2  0 - 7 % Final  . Basophil Count 09/19/2023 0.05  0 - 0.20 x10^9/L Final  . Basophil % 09/19/2023 0.6  0 - 2 % Final  . Slide Review/Morphology 09/19/2023 Yes   Final  . Immature Granulocyte Count 09/19/2023 0.04  <=0.06 x10^9/L Final  . Immature Granulocyte % 09/19/2023 0.5  <=0.7 % Final  . Immature Platelet Fraction 09/19/2023 2.9  1.6 - 8.5 % Final  . Reticulocyte % 09/19/2023 1.85  0.70 - 2.10 % Final  . Reticulocyte Count /L 09/19/2023 65.7  28.0 - 134.0 x10^9/L Final  . Reticulocyte-He 09/19/2023 25.8 (L)  29.2 - 38.8 pg Final  . Immature Reticulocyte Fraction 09/19/2023 15.6  3.1 - 16.0 % Final  . Lactate Dehydrogenase (LDH) 09/19/2023 145  100 - 200 U/L Final  . Iron  09/19/2023 30  28 - 170 g/dL Final  . Total Iron  Binding Capacity (TIBC) 09/19/2023 308  261 - 478 g/dL Final  . Percent Transferrin Saturation 09/19/2023 10 (L)  15 - 55 % Final  . Ferritin 09/19/2023 61  11 - 204 ng/mL Final  . ABO RH TYPE 09/19/2023 A Positive   Final  . Antibody Screen 09/19/2023 Negative   Final  . Specimen Outdate 09/19/2023 09-22-2023 23:59   Final  . Performing Lab 09/19/2023 [facility]   Final   Hospital Outpatient Visit on 09/06/2023  Component Date Value Ref Range Status  . WBC (White Blood Cell Count) 09/06/2023 10.4 (H)  3.2 - 9.8 x10^9/L Final  . Hemoglobin 09/06/2023 9.8 (L)  11.7 - 15.5 g/dL Final  . Hematocrit 92/88/7974 32.3 (L)  35.0 - 45.0 % Final  . Platelets 09/06/2023 196  150 - 450 x10^9/L Final  . MCV (Mean Corpuscular Volume) 09/06/2023 87  80 - 98 fL Final  . MCH (Mean Corpuscular Hemoglobin) 09/06/2023 26.3 (L)  26.5 - 34.0 pg Final  . MCHC (Mean Corpuscular Hemoglobin * 09/06/2023 30.3 (L)  31.0 - 36.0 % Final  . RBC (Red Blood Cell Count) 09/06/2023 3.72 (L)  3.77 - 5.16 x10^12/L Final  . RDW-CV (Red Cell Distribution Widt* 09/06/2023 23.6 (H)  11.5 - 14.5 % Final  . NRBC (Nucleated Red Blood Cell Cou* 09/06/2023 0.00  0 x10^9/L Final  . NRBC % (Nucleated Red Blood Cell %) 09/06/2023 0.0  % Final  . MPV (Mean Platelet Volume) 09/06/2023 10.6  7.2 - 11.7 fL Final  . Slide Review/Morphology 09/06/2023 Yes   Final  . Immature Platelet Fraction 09/06/2023 2.8  1.6 - 8.5 % Final  . Reticulocyte % 09/06/2023 3.13 (H)  0.70 - 2.10 % Final  . Reticulocyte Count /L 09/06/2023 116.4  28.0 - 134.0 x10^9/L Final  . Reticulocyte-He 09/06/2023 29.7  29.2 - 38.8 pg Final  . Immature Reticulocyte Fraction 09/06/2023 16.3 (H)  3.1 - 16.0 % Final  . Segmented Neutrophil % 09/06/2023 78  37 - 80 % Final  . Band % 09/06/2023 4  0 - 6 % Final  . Lymphocyte % 09/06/2023 10  10 - 50 % Final  . Monocyte % 09/06/2023 6  0 - 12 % Final  . Eosinophil %  09/06/2023 1  0 - 7 % Final  . Basophil % 09/06/2023 1  0 - 2 % Final  . Slide Review/Morphology 09/06/2023 Yes   Final  . Neutrophil Count, Absolute 09/06/2023 8.11  2.00 - 8.60  x10^9/L Final  . Band Count, Absolute 09/06/2023 0.42  x10^9/L Final  . Total Absolute Neutrophil Count (S* 09/06/2023 8.53  2.00 - 8.60 x10^9/L Final  . Lymphocyte Count, Absolute 09/06/2023 1.04  0.60 - 4.20 x10^9/L Final  . Monocyte Count, Absolute  09/06/2023 0.62  0.00 - 0.90 x10^9/L Final  . Eosinophil Count, Absolute 09/06/2023 0.10  0.00 - 0.70 x10^9/L Final  . Basophil Count, Absolute 09/06/2023 0.10  0.00 - 0.20 x10^9/L Final  Appointment on 09/03/2023  Component Date Value Ref Range Status  . WBC (White Blood Cell Count) 09/03/2023 14.0 (H)  3.2 - 9.8 x10^9/L Final  . Hemoglobin 09/03/2023 9.4 (L)  11.7 - 15.5 g/dL Final  . Hematocrit 92/91/7974 30.8 (L)  35.0 - 45.0 % Final  . Platelets 09/03/2023 239  150 - 450 x10^9/L Final  . MCV (Mean Corpuscular Volume) 09/03/2023 85  80 - 98 fL Final  . MCH (Mean Corpuscular Hemoglobin) 09/03/2023 26.0 (L)  26.5 - 34.0 pg Final  . MCHC (Mean Corpuscular Hemoglobin * 09/03/2023 30.5 (L)  31.0 - 36.0 % Final  . RBC (Red Blood Cell Count) 09/03/2023 3.62 (L)  3.77 - 5.16 x10^12/L Final  . RDW-CV (Red Cell Distribution Widt* 09/03/2023 22.1 (H)  11.5 - 14.5 % Final  . NRBC (Nucleated Red Blood Cell Cou* 09/03/2023 0.00  0 x10^9/L Final  . NRBC % (Nucleated Red Blood Cell %) 09/03/2023 0.0  % Final  . MPV (Mean Platelet Volume) 09/03/2023 10.7  7.2 - 11.7 fL Final  . Neutrophil Count 09/03/2023 12.0 (H)  2.0 - 8.6 x10^9/L Final  . Neutrophil % 09/03/2023 85.9 (H)  37 - 80 % Final  . Lymphocyte Count 09/03/2023 0.8  0.6 - 4.2 x10^9/L Final  . Lymphocyte % 09/03/2023 6.0 (L)  10 - 50 % Final  . Monocyte Count 09/03/2023 0.8  0 - 0.9 x10^9/L Final  . Monocyte % 09/03/2023 5.8  0 - 12 % Final  . Eosinophil Count 09/03/2023 0.18  0 - 0.70 x10^9/L Final  . Eosinophil % 09/03/2023 1.3  0 - 7 % Final  . Basophil Count 09/03/2023 0.05  0 - 0.20 x10^9/L Final  . Basophil % 09/03/2023 0.4  0 - 2 % Final  . Immature Granulocyte Count 09/03/2023 0.09 (H)  <=0.06 x10^9/L Final  . Immature Granulocyte % 09/03/2023 0.6  <=0.7 % Final  . Reticulocyte % 09/03/2023 5.76 (H)  0.70 - 2.10 % Final  . Reticulocyte Count /L 09/03/2023 208.5 (H)  28.0 - 134.0 x10^9/L Final  . Reticulocyte-He 09/03/2023 34.6   29.2 - 38.8 pg Final  . Immature Reticulocyte Fraction 09/03/2023 23.5 (H)  3.1 - 16.0 % Final  Admission on 09/03/2023, Discharged on 09/03/2023  Component Date Value Ref Range Status  . POC Glucose 09/03/2023 161 (H)  70 - 140 mg/dL Final  . POC Glucose 92/91/7974 164 (H)  70 - 140 mg/dL Final  Hospital Outpatient Visit on 08/29/2023  Component Date Value Ref Range Status  . Ferritin 08/29/2023 14  11 - 204 ng/mL Final  . WBC (White Blood Cell Count) 08/29/2023 8.4  3.2 - 9.8 x10^9/L Final  . Hemoglobin 08/29/2023 6.3 (L)  11.7 - 15.5 g/dL Final  . Hematocrit 92/96/7974 21.2 (L)  35.0 - 45.0 % Final  . Platelets 08/29/2023 220  150 - 450 x10^9/L Final  . MCV (Mean Corpuscular Volume) 08/29/2023 80  80 - 98 fL Final  . MCH (Mean Corpuscular Hemoglobin) 08/29/2023 23.9 (L)  26.5 -  34.0 pg Final  . MCHC (Mean Corpuscular Hemoglobin * 08/29/2023 29.7 (L)  31.0 - 36.0 % Final  . RBC (Red Blood Cell Count) 08/29/2023 2.64 (L)  3.77 - 5.16 x10^12/L Final  . RDW-CV (Red Cell Distribution Widt* 08/29/2023 17.8 (H)  11.5 - 14.5 % Final  . NRBC (Nucleated Red Blood Cell Cou* 08/29/2023 0.00  0 x10^9/L Final  . NRBC % (Nucleated Red Blood Cell %) 08/29/2023 0.0  % Final  . MPV (Mean Platelet Volume) 08/29/2023 11.4  7.2 - 11.7 fL Final  . Neutrophil Count 08/29/2023 7.1  2.0 - 8.6 x10^9/L Final  . Neutrophil % 08/29/2023 84.2 (H)  37 - 80 % Final  . Lymphocyte Count 08/29/2023 0.7  0.6 - 4.2 x10^9/L Final  . Lymphocyte % 08/29/2023 7.9 (L)  10 - 50 % Final  . Monocyte Count 08/29/2023 0.5  0 - 0.9 x10^9/L Final  . Monocyte % 08/29/2023 5.5  0 - 12 % Final  . Eosinophil Count 08/29/2023 0.13  0 - 0.70 x10^9/L Final  . Eosinophil % 08/29/2023 1.5  0 - 7 % Final  . Basophil Count 08/29/2023 0.04  0 - 0.20 x10^9/L Final  . Basophil % 08/29/2023 0.5  0 - 2 % Final  . Slide Review/Morphology 08/29/2023 Yes   Final  . Immature Granulocyte Count 08/29/2023 0.03  <=0.06 x10^9/L Final  . Immature  Granulocyte % 08/29/2023 0.4  <=0.7 % Final  . Immature Platelet Fraction 08/29/2023 3.1  1.6 - 8.5 % Final  . Iron  08/29/2023 163  28 - 170 g/dL Final  . Total Iron  Binding Capacity (TIBC) 08/29/2023 416  261 - 478 g/dL Final  . Percent Transferrin Saturation 08/29/2023 39  15 - 55 % Final  . Reticulocyte % 08/29/2023 2.60 (H)  0.70 - 2.10 % Final  . Reticulocyte Count /L 08/29/2023 68.6  28.0 - 134.0 x10^9/L Final  . Reticulocyte-He 08/29/2023 18.0 (L)  29.2 - 38.8 pg Final  . Immature Reticulocyte Fraction 08/29/2023 17.2 (H)  3.1 - 16.0 % Final  . ABO RH TYPE 08/29/2023 A Positive   Final  . Antibody Screen 08/29/2023 Negative   Final  . Specimen Outdate 08/29/2023 09-01-2023 23:59   Final  . Performing Lab 08/29/2023 [facility]   Final  . Sodium 08/29/2023 135  135 - 145 mmol/L Final  . Potassium 08/29/2023 4.5  3.5 - 5.0 mmol/L Final  . Chloride 08/29/2023 101  98 - 108 mmol/L Final  . Carbon Dioxide (CO2) 08/29/2023 25  21 - 30 mmol/L Final  . Urea Nitrogen (BUN) 08/29/2023 35 (H)  7 - 20 mg/dL Final  . Creatinine 92/96/7974 1.6 (H)  0.4 - 1.0 mg/dL Final  . Glucose 92/96/7974 118  70 - 140 mg/dL Final  . Calcium  08/29/2023 8.4 (L)  8.7 - 10.2 mg/dL Final  . AST (Aspartate Aminotransferase) 08/29/2023 30  15 - 41 U/L Final  . ALT (Alanine Aminotransferase) 08/29/2023 9 (L)  10 - 39 U/L Final  . Bilirubin, Total 08/29/2023 0.6  0.4 - 1.5 mg/dL Final  . Alk Phos (Alkaline Phosphatase) 08/29/2023 101  24 - 110 U/L Final  . Albumin  08/29/2023 3.0 (L)  3.5 - 4.8 g/dL Final  . Protein, Total 08/29/2023 6.3  6.2 - 8.1 g/dL Final  . Anion Gap 92/96/7974 9  3 - 12 mmol/L Final  . BUN/CREA Ratio 08/29/2023 22  6 - 27 Final  . Glomerular Filtration Rate (eGFR)  08/29/2023 32  mL/min/1.73sq m  Final  . Product Identification 09-09-23 Red Blood Cells   Final  . Product Code 2023/09/09 [id number]   Final  . Status Information 09-Sep-2023 Transfused   Final  . Blood Expiration Date  Sep 09, 2023 [id number]   Final  . Unit Number September 09, 2023 [id number]   Final  . Unit Blood Type September 09, 2023 A Pos   Final  . Cross Match Result Sep 09, 2023 Compatible   Final  . Coded Unit Blood Type 09/09/23 6200   Final  . Product Identification 2023/09/09 Red Blood Cells   Final  . Product Code 09-09-23 [id number]   Final  . Status Information 2023/09/09 Transfused   Final  . Blood Expiration Date 09-09-2023 [id number]   Final  . Unit Number 09/09/2023 [id number]   Final  . Unit Blood Type 09-09-2023 A Pos   Final  . Cross Match Result 09-Sep-2023 Compatible   Final  . Coded Unit Blood Type 09-09-23 6200   Final  . Issue Date/Time 09/09/2023 Sep 09, 2023 12:33   Final  . Issue Date/Time September 09, 2023 Sep 09, 2023 14:51   Final  Office Visit on 08/23/2023  Component Date Value Ref Range Status  . Parathyroid Hormone, Intact - [facility]* 08/23/2023 10 (L)  15 - 65 pg/mL Final  . Vitamin D , 25-Hydroxy - [facility] 08/23/2023 50.6  30.0 - 100.0 ng/mL Final  . Phosphorus 08/23/2023 4.7  2.5 - 5.0 mg/dL Final  . Calcium  08/23/2023 8.7  8.7 - 10.3 mg/dL Final  . Thyroid  Stimulating Hormone (TSH) 08/23/2023 0.790  0.450-5.330 uIU/ml uIU/mL Final  . WBC (White Blood Cell Count) 08/23/2023 9.5  4.1 - 10.2 10^3/uL Final  . RBC (Red Blood Cell Count) 08/23/2023 2.82 (L)  4.04 - 5.48 10^6/uL Final  . Hemoglobin 08/23/2023 7.0 (L)  12.0 - 15.0 gm/dL Final  . Hematocrit 93/72/7974 22.9 (L)  35.0 - 47.0 % Final  . MCV (Mean Corpuscular Volume) 08/23/2023 81.2  80.0 - 100.0 fl Final  . MCH (Mean Corpuscular Hemoglobin) 08/23/2023 24.8 (L)  27.0 - 31.2 pg Final  . MCHC (Mean Corpuscular Hemoglobin * 08/23/2023 30.6 (L)  32.0 - 36.0 gm/dL Final  . Platelet Count 08/23/2023 199  150 - 450 10^3/uL Final  . RDW-CV (Red Cell Distribution Widt* 08/23/2023 17.1 (H)  11.6 - 14.8 % Final  . MPV (Mean Platelet Volume) 08/23/2023 11.8  9.4 - 12.4 fl Final  . Neutrophils 08/23/2023 7.62  1.50 - 7.80 10^3/uL Final   . Lymphocytes 08/23/2023 1.03  1.00 - 3.60 10^3/uL Final  . Monocytes 08/23/2023 0.66  0.00 - 1.50 10^3/uL Final  . Eosinophils 08/23/2023 0.14  0.00 - 0.55 10^3/uL Final  . Basophils 08/23/2023 0.05  0.00 - 0.09 10^3/uL Final  . Neutrophil % 08/23/2023 79.9 (H)  32.0 - 70.0 % Final  . Lymphocyte % 08/23/2023 10.8  10.0 - 50.0 % Final  . Monocyte % 08/23/2023 6.9  4.0 - 13.0 % Final  . Eosinophil % 08/23/2023 1.5  1.0 - 5.0 % Final  . Basophil% 08/23/2023 0.5  0.0 - 2.0 % Final  . Immature Granulocyte % 08/23/2023 0.4  <=0.7 % Final  . Immature Granulocyte Count 08/23/2023 0.04  <=0.06 10^3/L Final  Hospital Outpatient Visit on 08/22/2023  Component Date Value Ref Range Status  . Case Report 08/22/2023    Final                   Value:Surgical Pathology  Case: [id number]                                Authorizing Provider:  Sherran Cain, [place] Collected:           08/22/2023 1144             Ordering Location:     [facility]   Received:            08/22/2023 1737             Pathologist:           Rulon Elsie Ade, MD                                                   Specimen:    Bone                                                                                    . DIAGNOSIS 08/22/2023    Final                   Value:A.  Bone, presumed T12 vertebra, biopsy:  Fragments of bone, scant marrow, and skeletal muscle.    . Clinical Information 08/22/2023    Final                   Value:T12 compression fracture, with delayed healing, subsequent encounter Age-related osteoporosis with current pathological fracture, vertebra(e), initial encounter for fracture (CMS/HHS-HCC)  . Gross Examination 08/22/2023    Final                   Value:A. Bone, received in formalin is a 0.7 x 0.4 x 0.1 cm aggregate of highly fragmented tan-pink soft tissue core fragments; submitted entirely in block A1.  S. Allison/Dr. Lucillie   . Microscopic  Examination 08/22/2023    Final                   Value:Microscopic examination is performed.   . Additional Documentation 08/22/2023    Final                   Value:All immunohistochemistry, in situ hybridization tests and special stains performed at [facility] and reported herein were developed, validated and their performance characteristics determined by the [facility]. During the performance of these tests, appropriate positive and negative control slides are also performed and reviewed. All control slides and internal controls (when applicable) demonstrate the expected immunoreactive patterns and/or nucleic acid hybridization. These ancillary studies were deemed medically necessary by the requesting pathologist. They were ordered following review of the H&E and clinical history except when part of a liver/kidney protocol or where clinical history (e.g. immunocompromised, critically ill, history of malignancy) clearly indicates. Some of the tests may not be cleared or approved by the U.S. Food and Drug Administration (FDA).  The FDA has determined that such clearance or approval is  not necessary.  These tests                          are used for clinical purposes and should not be regarded as investigational or as research.  This laboratory is certified under the Clinical Laboratory Improvement Amendments of 1988 (CLIA) as qualified to perform high complexity clinical testing. At least one block in this case underwent decalcification during its processing. The vast majority of immunohistochemical and hybridization assays were not validated on decalcified tissues. The results were interpreted with caution given the possibility of false negative results on decalcified specimens.   Rebecca Kirby 08/22/2023    Final                   Value:All of the diagnostic evaluations on the enumerated specimens have been personally conducted by the pathologists involved in the  care of this patient as indicated by the electronic signatures above.   Hospital Outpatient Visit on 08/02/2023  Component Date Value Ref Range Status  . WBC (White Blood Cell Count) 08/02/2023 8.0  3.2 - 9.8 x10^9/L Final  . Hemoglobin 08/02/2023 9.1 (L)  11.7 - 15.5 g/dL Final  . Hematocrit 93/93/7974 30.2 (L)  35.0 - 45.0 % Final  . Platelets 08/02/2023 193  150 - 450 x10^9/L Final  . MCV (Mean Corpuscular Volume) 08/02/2023 84  80 - 98 fL Final  . MCH (Mean Corpuscular Hemoglobin) 08/02/2023 25.3 (L)  26.5 - 34.0 pg Final  . MCHC (Mean Corpuscular Hemoglobin * 08/02/2023 30.1 (L)  31.0 - 36.0 % Final  . RBC (Red Blood Cell Count) 08/02/2023 3.59 (L)  3.77 - 5.16 x10^12/L Final  . RDW-CV (Red Cell Distribution Widt* 08/02/2023 18.7 (H)  11.5 - 14.5 % Final  . NRBC (Nucleated Red Blood Cell Cou* 08/02/2023 0.00  0 x10^9/L Final  . NRBC % (Nucleated Red Blood Cell %) 08/02/2023 0.0  % Final  . MPV (Mean Platelet Volume) 08/02/2023 12.3 (H)  7.2 - 11.7 fL Final  . Neutrophil Count 08/02/2023 6.3  2.0 - 8.6 x10^9/L Final  . Neutrophil % 08/02/2023 79.6  37 - 80 % Final  . Lymphocyte Count 08/02/2023 0.9  0.6 - 4.2 x10^9/L Final  . Lymphocyte % 08/02/2023 11.6  10 - 50 % Final  . Monocyte Count 08/02/2023 0.5  0 - 0.9 x10^9/L Final  . Monocyte % 08/02/2023 5.8  0 - 12 % Final  . Eosinophil Count 08/02/2023 0.15  0 - 0.70 x10^9/L Final  . Eosinophil % 08/02/2023 1.9  0 - 7 % Final  . Basophil Count 08/02/2023 0.05  0 - 0.20 x10^9/L Final  . Basophil % 08/02/2023 0.6  0 - 2 % Final  . Slide Review/Morphology 08/02/2023 Yes   Final  . Immature Granulocyte Count 08/02/2023 0.04  <=0.06 x10^9/L Final  . Immature Granulocyte % 08/02/2023 0.5  <=0.7 % Final  . Immature Platelet Fraction 08/02/2023 3.6  1.6 - 8.5 % Final  . Reticulocyte % 08/02/2023 1.25  0.70 - 2.10 % Final  . Reticulocyte Count /L 08/02/2023 44.9  28.0 - 134.0 x10^9/L Final  . Reticulocyte-He 08/02/2023 21.1 (L)  29.2 - 38.8  pg Final  . Immature Reticulocyte Fraction 08/02/2023 18.8 (H)  3.1 - 16.0 % Final  Hospital Outpatient Visit on 07/14/2023  Component Date Value Ref Range Status  . WBC (White Blood Cell Count) 07/14/2023 10.1 (H)  3.2 - 9.8 x10^9/L Final  . Hemoglobin 07/14/2023  9.6 (L)  11.7 - 15.5 g/dL Final  . Hematocrit 94/81/7974 31.3 (L)  35.0 - 45.0 % Final  . Platelets 07/14/2023 231  150 - 450 x10^9/L Final  . MCV (Mean Corpuscular Volume) 07/14/2023 89  80 - 98 fL Final  . MCH (Mean Corpuscular Hemoglobin) 07/14/2023 27.3  26.5 - 34.0 pg Final  . MCHC (Mean Corpuscular Hemoglobin * 07/14/2023 30.7 (L)  31.0 - 36.0 % Final  . RBC (Red Blood Cell Count) 07/14/2023 3.52 (L)  3.77 - 5.16 x10^12/L Final  . RDW-CV (Red Cell Distribution Widt* 07/14/2023 20.3 (H)  11.5 - 14.5 % Final  . NRBC (Nucleated Red Blood Cell Cou* 07/14/2023 0.00  0 x10^9/L Final  . NRBC % (Nucleated Red Blood Cell %) 07/14/2023 0.0  % Final  . MPV (Mean Platelet Volume) 07/14/2023 11.3  7.2 - 11.7 fL Final  . Neutrophil Count 07/14/2023 8.6  2.0 - 8.6 x10^9/L Final  . Neutrophil % 07/14/2023 85.8 (H)  37 - 80 % Final  . Lymphocyte Count 07/14/2023 0.7  0.6 - 4.2 x10^9/L Final  . Lymphocyte % 07/14/2023 6.6 (L)  10 - 50 % Final  . Monocyte Count 07/14/2023 0.6  0 - 0.9 x10^9/L Final  . Monocyte % 07/14/2023 5.5  0 - 12 % Final  . Eosinophil Count 07/14/2023 0.10  0 - 0.70 x10^9/L Final  . Eosinophil % 07/14/2023 1.0  0 - 7 % Final  . Basophil Count 07/14/2023 0.06  0 - 0.20 x10^9/L Final  . Basophil % 07/14/2023 0.6  0 - 2 % Final  . Slide Review/Morphology 07/14/2023 Yes   Final  . Immature Granulocyte Count 07/14/2023 0.05  <=0.06 x10^9/L Final  . Immature Granulocyte % 07/14/2023 0.5  <=0.7 % Final  . Immature Platelet Fraction 07/14/2023 3.0  1.6 - 8.5 % Final  . Reticulocyte % 07/14/2023 2.32 (H)  0.70 - 2.10 % Final  . Reticulocyte Count /L 07/14/2023 81.7  28.0 - 134.0 x10^9/L Final  . Reticulocyte-He 07/14/2023  22.4 (L)  29.2 - 38.8 pg Final  . Immature Reticulocyte Fraction 07/14/2023 19.8 (H)  3.1 - 16.0 % Final  There may be more visits with results that are not included.   DIAGNOSIS: Annual physical exam  (primary encounter diagnosis)  Depression screening (Z13.31)  Primary hypertension  Postoperative hypothyroidism  Type 2 diabetes mellitus with stage 3 chronic kidney disease, with long-term current use of insulin , unspecified whether stage 3a or 3b CKD (CMS/HHS-HCC)  Anemia of chronic renal failure, CKD stage 4  Iron  deficiency anemia due to chronic blood loss  Vitamin D  deficiency   PLAN: T-spine, CXR and KUB today. Labs today. Stop Celexa  and begin Cymbalta. Add Remeron and Buspar. RTC 6 weeks, sooner I fneeded     Attestation Statement:   I personally performed the service. (TP)  Reyes JONETTA Costa, MD, MD

## 2023-10-14 ENCOUNTER — Other Ambulatory Visit: Payer: Self-pay | Admitting: Pulmonary Disease

## 2023-10-14 DIAGNOSIS — J90 Pleural effusion, not elsewhere classified: Secondary | ICD-10-CM

## 2023-10-15 ENCOUNTER — Ambulatory Visit
Admission: RE | Admit: 2023-10-15 | Discharge: 2023-10-15 | Disposition: A | Discharge status: Home / Self Care | Source: Ambulatory Visit | Attending: Pulmonary Disease | Admitting: Pulmonary Disease

## 2023-10-15 ENCOUNTER — Ambulatory Visit
Admission: RE | Admit: 2023-10-15 | Discharge: 2023-10-15 | Disposition: A | Discharge status: Home / Self Care | Source: Ambulatory Visit | Attending: Student | Admitting: Student

## 2023-10-15 ENCOUNTER — Telehealth (HOSPITAL_COMMUNITY): Payer: Self-pay | Admitting: Student

## 2023-10-15 ENCOUNTER — Other Ambulatory Visit: Payer: Self-pay | Admitting: Student

## 2023-10-15 DIAGNOSIS — J9383 Other pneumothorax: Secondary | ICD-10-CM | POA: Insufficient documentation

## 2023-10-15 DIAGNOSIS — J90 Pleural effusion, not elsewhere classified: Secondary | ICD-10-CM | POA: Insufficient documentation

## 2023-10-15 DIAGNOSIS — J939 Pneumothorax, unspecified: Secondary | ICD-10-CM

## 2023-10-15 DIAGNOSIS — Z9889 Other specified postprocedural states: Secondary | ICD-10-CM

## 2023-10-15 LAB — AMYLASE, PLEURAL OR PERITONEAL FLUID: Amylase, Fluid: 14 U/L

## 2023-10-15 LAB — BODY FLUID CELL COUNT WITH DIFFERENTIAL
Eos, Fluid: 0 %
Lymphs, Fluid: 9 %
Monocyte-Macrophage-Serous Fluid: 0 %
Neutrophil Count, Fluid: 91 %
Total Nucleated Cell Count, Fluid: 255 uL

## 2023-10-15 LAB — PROTEIN, PLEURAL OR PERITONEAL FLUID: Total protein, fluid: <3 g/dL

## 2023-10-15 LAB — GLUCOSE, PLEURAL OR PERITONEAL FLUID: Glucose, Fluid: 137 mg/dL

## 2023-10-15 LAB — LACTATE DEHYDROGENASE, PLEURAL OR PERITONEAL FLUID: LD, Fluid: 184 U/L — ABNORMAL HIGH (ref 3–23)

## 2023-10-15 LAB — ALBUMIN, PLEURAL OR PERITONEAL FLUID: Albumin, Fluid: <1.5 g/dL

## 2023-10-15 MED ORDER — LIDOCAINE HCL 1 % IJ SOLN
INTRAMUSCULAR | Status: AC
  Filled 2023-10-15: qty 20

## 2023-10-15 NOTE — Telephone Encounter (Signed)
 Outpatient order placed for CXR to evaluate small left apical pneumothorax following today's thoracentesis.   Aviance Cooperwood, AGACNP-BC 10/15/2023, 1:10 PM

## 2023-10-15 NOTE — Procedures (Addendum)
 PROCEDURE SUMMARY:  Successful US  guided left thoracentesis. Yielded 450 ml of blood-tinged fluid. Pt tolerated procedure well. No immediate complications.  Specimen sent for labs. CXR ordered; small apical pneumothorax identified. Patient is asymptomatic. She will return to [facility] 10/16/23 for a follow up CXR. Patient aware to seek immediate medical attention if she develops chest pain, worsening shortness of breath or other concerning symptoms.   EBL < 2 mL  Warren JONELLE Dais, NP 10/15/2023 12:56 PM

## 2023-10-16 ENCOUNTER — Other Ambulatory Visit: Payer: Self-pay

## 2023-10-16 ENCOUNTER — Ambulatory Visit
Admission: RE | Admit: 2023-10-16 | Discharge: 2023-10-16 | Disposition: A | Discharge status: Home / Self Care | Source: Ambulatory Visit | Attending: Student | Admitting: Student

## 2023-10-16 ENCOUNTER — Inpatient Hospital Stay
Admission: EM | Admit: 2023-10-16 | Discharge: 2023-10-19 | DRG: 641 | Disposition: A | Discharge status: Home / Self Care | Attending: Family Medicine | Admitting: Family Medicine

## 2023-10-16 DIAGNOSIS — F32A Depression, unspecified: Secondary | ICD-10-CM | POA: Diagnosis present

## 2023-10-16 DIAGNOSIS — Z87891 Personal history of nicotine dependence: Secondary | ICD-10-CM

## 2023-10-16 DIAGNOSIS — K9089 Other intestinal malabsorption: Secondary | ICD-10-CM | POA: Diagnosis present

## 2023-10-16 DIAGNOSIS — D509 Iron deficiency anemia, unspecified: Secondary | ICD-10-CM | POA: Diagnosis present

## 2023-10-16 DIAGNOSIS — E669 Obesity, unspecified: Secondary | ICD-10-CM | POA: Diagnosis present

## 2023-10-16 DIAGNOSIS — E039 Hypothyroidism, unspecified: Secondary | ICD-10-CM | POA: Diagnosis present

## 2023-10-16 DIAGNOSIS — Z90710 Acquired absence of both cervix and uterus: Secondary | ICD-10-CM

## 2023-10-16 DIAGNOSIS — F419 Anxiety disorder, unspecified: Secondary | ICD-10-CM | POA: Diagnosis present

## 2023-10-16 DIAGNOSIS — Z7983 Long term (current) use of bisphosphonates: Secondary | ICD-10-CM

## 2023-10-16 DIAGNOSIS — Z79890 Hormone replacement therapy (postmenopausal): Secondary | ICD-10-CM

## 2023-10-16 DIAGNOSIS — J95811 Postprocedural pneumothorax: Secondary | ICD-10-CM | POA: Diagnosis present

## 2023-10-16 DIAGNOSIS — Z96653 Presence of artificial knee joint, bilateral: Secondary | ICD-10-CM | POA: Diagnosis present

## 2023-10-16 DIAGNOSIS — Z7984 Long term (current) use of oral hypoglycemic drugs: Secondary | ICD-10-CM

## 2023-10-16 DIAGNOSIS — I5032 Chronic diastolic (congestive) heart failure: Secondary | ICD-10-CM | POA: Diagnosis present

## 2023-10-16 DIAGNOSIS — J90 Pleural effusion, not elsewhere classified: Secondary | ICD-10-CM | POA: Diagnosis present

## 2023-10-16 DIAGNOSIS — N1832 Chronic kidney disease, stage 3b: Secondary | ICD-10-CM | POA: Diagnosis present

## 2023-10-16 DIAGNOSIS — J939 Pneumothorax, unspecified: Secondary | ICD-10-CM | POA: Insufficient documentation

## 2023-10-16 DIAGNOSIS — Z9981 Dependence on supplemental oxygen: Secondary | ICD-10-CM

## 2023-10-16 DIAGNOSIS — R531 Weakness: Secondary | ICD-10-CM | POA: Diagnosis present

## 2023-10-16 DIAGNOSIS — E1122 Type 2 diabetes mellitus with diabetic chronic kidney disease: Secondary | ICD-10-CM | POA: Diagnosis present

## 2023-10-16 DIAGNOSIS — K219 Gastro-esophageal reflux disease without esophagitis: Secondary | ICD-10-CM | POA: Diagnosis present

## 2023-10-16 DIAGNOSIS — Z85850 Personal history of malignant neoplasm of thyroid: Secondary | ICD-10-CM

## 2023-10-16 DIAGNOSIS — R9431 Abnormal electrocardiogram [ECG] [EKG]: Secondary | ICD-10-CM | POA: Diagnosis present

## 2023-10-16 DIAGNOSIS — Z794 Long term (current) use of insulin: Secondary | ICD-10-CM

## 2023-10-16 DIAGNOSIS — I130 Hypertensive heart and chronic kidney disease with heart failure and stage 1 through stage 4 chronic kidney disease, or unspecified chronic kidney disease: Secondary | ICD-10-CM | POA: Diagnosis present

## 2023-10-16 DIAGNOSIS — R0902 Hypoxemia: Secondary | ICD-10-CM | POA: Diagnosis present

## 2023-10-16 DIAGNOSIS — Z8673 Personal history of transient ischemic attack (TIA), and cerebral infarction without residual deficits: Secondary | ICD-10-CM

## 2023-10-16 DIAGNOSIS — N390 Urinary tract infection, site not specified: Secondary | ICD-10-CM | POA: Diagnosis present

## 2023-10-16 DIAGNOSIS — Z85528 Personal history of other malignant neoplasm of kidney: Secondary | ICD-10-CM

## 2023-10-16 DIAGNOSIS — Z85038 Personal history of other malignant neoplasm of large intestine: Secondary | ICD-10-CM

## 2023-10-16 DIAGNOSIS — Z853 Personal history of malignant neoplasm of breast: Secondary | ICD-10-CM

## 2023-10-16 DIAGNOSIS — I10 Essential (primary) hypertension: Secondary | ICD-10-CM | POA: Diagnosis present

## 2023-10-16 DIAGNOSIS — Z803 Family history of malignant neoplasm of breast: Secondary | ICD-10-CM

## 2023-10-16 DIAGNOSIS — Z8507 Personal history of malignant neoplasm of pancreas: Secondary | ICD-10-CM

## 2023-10-16 DIAGNOSIS — E1129 Type 2 diabetes mellitus with other diabetic kidney complication: Secondary | ICD-10-CM | POA: Diagnosis present

## 2023-10-16 DIAGNOSIS — E66811 Obesity, class 1: Secondary | ICD-10-CM | POA: Diagnosis present

## 2023-10-16 DIAGNOSIS — F418 Other specified anxiety disorders: Secondary | ICD-10-CM | POA: Diagnosis present

## 2023-10-16 DIAGNOSIS — E890 Postprocedural hypothyroidism: Secondary | ICD-10-CM | POA: Diagnosis present

## 2023-10-16 DIAGNOSIS — Z6833 Body mass index (BMI) 33.0-33.9, adult: Secondary | ICD-10-CM

## 2023-10-16 DIAGNOSIS — Z79899 Other long term (current) drug therapy: Secondary | ICD-10-CM

## 2023-10-16 DIAGNOSIS — Z90411 Acquired partial absence of pancreas: Secondary | ICD-10-CM

## 2023-10-16 DIAGNOSIS — K7460 Unspecified cirrhosis of liver: Secondary | ICD-10-CM | POA: Diagnosis present

## 2023-10-16 LAB — CBC
HCT: 34.8 % — ABNORMAL LOW (ref 36.0–46.0)
Hemoglobin: 10.2 g/dL — ABNORMAL LOW (ref 12.0–15.0)
MCH: 27.8 pg (ref 26.0–34.0)
MCHC: 29.3 g/dL — ABNORMAL LOW (ref 30.0–36.0)
MCV: 94.8 fL (ref 80.0–100.0)
Platelets: 212 K/uL (ref 150–400)
RBC: 3.67 MIL/uL — ABNORMAL LOW (ref 3.87–5.11)
RDW: 23.1 % — ABNORMAL HIGH (ref 11.5–15.5)
WBC: 10.3 K/uL (ref 4.0–10.5)
nRBC: 0 % (ref 0.0–0.2)

## 2023-10-16 LAB — COMPREHENSIVE METABOLIC PANEL WITH GFR
ALT: 17 U/L (ref 0–44)
AST: 38 U/L (ref 15–41)
Albumin: 3 g/dL — ABNORMAL LOW (ref 3.5–5.0)
Alkaline Phosphatase: 126 U/L (ref 38–126)
Anion gap: 11 (ref 5–15)
BUN: 25 mg/dL — ABNORMAL HIGH (ref 8–23)
CO2: 20 mmol/L — ABNORMAL LOW (ref 22–32)
Calcium: 5.9 mg/dL — CL (ref 8.9–10.3)
Chloride: 108 mmol/L (ref 98–111)
Creatinine, Ser: 1.64 mg/dL — ABNORMAL HIGH (ref 0.44–1.00)
GFR, Estimated: 31 mL/min — ABNORMAL LOW (ref >60)
Glucose, Bld: 199 mg/dL — ABNORMAL HIGH (ref 70–99)
Potassium: 4.3 mmol/L (ref 3.5–5.1)
Sodium: 139 mmol/L (ref 135–145)
Total Bilirubin: 0.6 mg/dL (ref 0.0–1.2)
Total Protein: 6.6 g/dL (ref 6.5–8.1)

## 2023-10-16 LAB — MAGNESIUM: Magnesium: 2 mg/dL (ref 1.7–2.4)

## 2023-10-16 LAB — BRAIN NATRIURETIC PEPTIDE: B Natriuretic Peptide: 94.9 pg/mL (ref 0.0–100.0)

## 2023-10-16 LAB — PHOSPHORUS: Phosphorus: 3.4 mg/dL (ref 2.5–4.6)

## 2023-10-16 MED ORDER — CALCIUM ACETATE (PHOS BINDER) 667 MG PO CAPS
667.0000 mg | ORAL_CAPSULE | Freq: Two times a day (BID) | ORAL | Status: DC
  Administered 2023-10-17 – 2023-10-18 (×3): 667 mg via ORAL
  Filled 2023-10-16 (×4): qty 1

## 2023-10-16 MED ORDER — INSULIN ASPART 100 UNIT/ML IJ SOLN
0.0000 [IU] | Freq: Three times a day (TID) | INTRAMUSCULAR | Status: DC
  Administered 2023-10-17 (×2): 3 [IU] via SUBCUTANEOUS
  Administered 2023-10-17 – 2023-10-19 (×2): 2 [IU] via SUBCUTANEOUS
  Filled 2023-10-16 (×5): qty 1

## 2023-10-16 MED ORDER — LORATADINE 10 MG PO TABS
10.0000 mg | ORAL_TABLET | Freq: Every day | ORAL | Status: DC
  Administered 2023-10-17 – 2023-10-19 (×3): 10 mg via ORAL
  Filled 2023-10-16 (×3): qty 1

## 2023-10-16 MED ORDER — EMPAGLIFLOZIN 10 MG PO TABS
10.0000 mg | ORAL_TABLET | Freq: Every day | ORAL | Status: DC
  Administered 2023-10-17 – 2023-10-19 (×3): 10 mg via ORAL
  Filled 2023-10-16 (×3): qty 1

## 2023-10-16 MED ORDER — ACETAMINOPHEN 325 MG PO TABS: 650.0000 mg | ORAL_TABLET | Freq: Four times a day (QID) | ORAL | Status: DC | PRN

## 2023-10-16 MED ORDER — CALCIUM GLUCONATE-NACL 1-0.675 GM/50ML-% IV SOLN
1.0000 g | Freq: Once | INTRAVENOUS | Status: DC
  Filled 2023-10-16: qty 50

## 2023-10-16 MED ORDER — ENOXAPARIN SODIUM 40 MG/0.4ML IJ SOSY: 40.0000 mg | PREFILLED_SYRINGE | INTRAMUSCULAR | Status: DC

## 2023-10-16 MED ORDER — PANTOPRAZOLE SODIUM 40 MG PO TBEC
40.0000 mg | DELAYED_RELEASE_TABLET | Freq: Two times a day (BID) | ORAL | Status: DC
  Administered 2023-10-17 – 2023-10-19 (×6): 40 mg via ORAL
  Filled 2023-10-16 (×6): qty 1

## 2023-10-16 MED ORDER — FERROUS SULFATE 325 (65 FE) MG PO TABS
325.0000 mg | ORAL_TABLET | Freq: Every day | ORAL | Status: DC
  Administered 2023-10-17 – 2023-10-19 (×3): 325 mg via ORAL
  Filled 2023-10-16 (×3): qty 1

## 2023-10-16 MED ORDER — INSULIN ASPART 100 UNIT/ML IJ SOLN
0.0000 [IU] | Freq: Every day | INTRAMUSCULAR | Status: DC
  Administered 2023-10-17: 4 [IU] via SUBCUTANEOUS
  Administered 2023-10-18: 5 [IU] via SUBCUTANEOUS
  Filled 2023-10-16 (×4): qty 1

## 2023-10-16 MED ORDER — DM-GUAIFENESIN ER 30-600 MG PO TB12: 1.0000 | ORAL_TABLET | Freq: Two times a day (BID) | ORAL | Status: DC | PRN

## 2023-10-16 MED ORDER — ALBUTEROL SULFATE (2.5 MG/3ML) 0.083% IN NEBU: 2.5000 mg | INHALATION_SOLUTION | RESPIRATORY_TRACT | Status: DC | PRN

## 2023-10-16 MED ORDER — LOSARTAN POTASSIUM 25 MG PO TABS
25.0000 mg | ORAL_TABLET | Freq: Every day | ORAL | Status: DC
  Administered 2023-10-17 – 2023-10-18 (×3): 25 mg via ORAL
  Filled 2023-10-16 (×3): qty 1

## 2023-10-16 MED ORDER — HYDRALAZINE HCL 20 MG/ML IJ SOLN: 5.0000 mg | INTRAMUSCULAR | Status: DC | PRN

## 2023-10-16 MED ORDER — LEVOTHYROXINE SODIUM 100 MCG PO TABS
200.0000 ug | ORAL_TABLET | Freq: Every day | ORAL | Status: DC
  Administered 2023-10-17 – 2023-10-19 (×3): 200 ug via ORAL
  Filled 2023-10-16 (×3): qty 2

## 2023-10-16 MED ORDER — ALPRAZOLAM 0.25 MG PO TABS
0.2500 mg | ORAL_TABLET | Freq: Every day | ORAL | Status: DC
  Administered 2023-10-17 – 2023-10-18 (×3): 0.25 mg via ORAL
  Filled 2023-10-16 (×3): qty 1

## 2023-10-16 MED ORDER — DIPHENHYDRAMINE HCL 50 MG/ML IJ SOLN: 12.5000 mg | Freq: Three times a day (TID) | INTRAMUSCULAR | Status: DC | PRN

## 2023-10-16 MED ORDER — VITAMIN B-12 1000 MCG PO TABS
1000.0000 ug | ORAL_TABLET | Freq: Every day | ORAL | Status: DC
  Administered 2023-10-17 – 2023-10-19 (×3): 1000 ug via ORAL
  Filled 2023-10-16 (×3): qty 1

## 2023-10-16 MED ORDER — VITAMIN D3 25 MCG (1000 UNIT) PO TABS
1000.0000 [IU] | ORAL_TABLET | Freq: Every day | ORAL | Status: DC
  Administered 2023-10-17 – 2023-10-19 (×3): 1000 [IU] via ORAL
  Filled 2023-10-16 (×6): qty 1

## 2023-10-16 MED ORDER — FUROSEMIDE 20 MG PO TABS
20.0000 mg | ORAL_TABLET | Freq: Every day | ORAL | Status: DC
  Administered 2023-10-17 – 2023-10-19 (×3): 20 mg via ORAL
  Filled 2023-10-16 (×3): qty 1

## 2023-10-16 MED ORDER — INSULIN GLARGINE 100 UNIT/ML ~~LOC~~ SOLN
40.0000 [IU] | Freq: Every day | SUBCUTANEOUS | Status: DC
  Administered 2023-10-17 – 2023-10-18 (×3): 40 [IU] via SUBCUTANEOUS
  Filled 2023-10-16 (×5): qty 0.4

## 2023-10-16 MED ORDER — DICYCLOMINE HCL 10 MG PO CAPS
10.0000 mg | ORAL_CAPSULE | Freq: Two times a day (BID) | ORAL | Status: DC
Start: 2023-10-16 — End: 2023-10-19
  Administered 2023-10-17 – 2023-10-19 (×6): 10 mg via ORAL
  Filled 2023-10-16 (×6): qty 1

## 2023-10-16 MED ORDER — ALBUTEROL SULFATE HFA 108 (90 BASE) MCG/ACT IN AERS: 2.0000 | INHALATION_SPRAY | RESPIRATORY_TRACT | Status: DC | PRN

## 2023-10-16 MED ORDER — MAGNESIUM OXIDE 400 MG PO TABS
400.0000 mg | ORAL_TABLET | Freq: Two times a day (BID) | ORAL | Status: DC
  Administered 2023-10-17 – 2023-10-19 (×5): 400 mg via ORAL
  Filled 2023-10-16 (×12): qty 1

## 2023-10-16 MED ORDER — HEPARIN SODIUM (PORCINE) 5000 UNIT/ML IJ SOLN: 5000.0000 [IU] | Freq: Three times a day (TID) | INTRAMUSCULAR | Status: DC

## 2023-10-16 MED ORDER — CALCIUM GLUCONATE-NACL 2-0.675 GM/100ML-% IV SOLN
2.0000 g | Freq: Once | INTRAVENOUS | Status: AC
  Administered 2023-10-16: 2000 mg via INTRAVENOUS
  Filled 2023-10-16: qty 100

## 2023-10-16 MED ORDER — CALCITRIOL 0.25 MCG PO CAPS: 0.5000 ug | ORAL_CAPSULE | Freq: Every day | ORAL | Status: DC

## 2023-10-16 NOTE — ED Provider Notes (Signed)
 [facility] Provider Note    Event Date/Time   First MD Initiated Contact with Patient 10/16/23 2100     (approximate)   History   Abnormal Lab   HPI  Rebecca Kirby is a 78 y.o. female with past medical history of diabetes, multiple CVA who presents with reported abnormal calcium .  Patient reports she has been feeling weak the last 7 days or so, had labs drawn which demonstrated calcium  less than 6, referred to the emergency department.  Daughter notes that patient had a shot of Prolia at the end of July but otherwise no significant changes in medications.  In reviewing the patient's prior records, her calcium  has typically been between 8.4 and 8.6     Physical Exam   Triage Vital Signs: ED Triage Vitals  Encounter Vitals Group     BP 10/16/23 1950 (!) 107/45     Girls Systolic BP Percentile --      Girls Diastolic BP Percentile --      Boys Systolic BP Percentile --      Boys Diastolic BP Percentile --      Pulse Rate 10/16/23 1950 (!) 108     Resp 10/16/23 1950 (!) 21     Temp 10/16/23 1950 98.1 F (36.7 C)     Temp Source 10/16/23 1950 Oral     SpO2 10/16/23 1950 99 %     Weight 10/16/23 1948 92.5 kg (204 lb)     Height --      Head Circumference --      Peak Flow --      Pain Score 10/16/23 1948 0     Pain Loc --      Pain Education --      Exclude from Growth Chart --     Most recent vital signs: Vitals:   10/16/23 1950  BP: (!) 107/45  Pulse: (!) 108  Resp: (!) 21  Temp: 98.1 F (36.7 C)  SpO2: 99%     General: Awake, no distress.  CV:  Good peripheral perfusion.  Resp:  Normal effort.  Abd:  No distention.  Other:  Normal strength in all extremities   ED Results / Procedures / Treatments   Labs (all labs ordered are listed, but only abnormal results are displayed) Labs Reviewed  COMPREHENSIVE METABOLIC PANEL WITH GFR - Abnormal; Notable for the following components:      Result Value   CO2 20 (*)    Glucose,  Bld 199 (*)    BUN 25 (*)    Creatinine, Ser 1.64 (*)    Calcium  5.9 (*)    Albumin  3.0 (*)    GFR, Estimated 31 (*)    All other components within normal limits  CBC - Abnormal; Notable for the following components:   RBC 3.67 (*)    Hemoglobin 10.2 (*)    HCT 34.8 (*)    MCHC 29.3 (*)    RDW 23.1 (*)    All other components within normal limits  URINALYSIS, ROUTINE W REFLEX MICROSCOPIC  CBG MONITORING, ED     EKG  ED ECG REPORT I, Lamar Price, the attending physician, personally viewed and interpreted this ECG.  Date: 10/16/2023  Rhythm: normal sinus rhythm QRS Axis: normal Intervals: normal ST/T Wave abnormalities: normal Narrative Interpretation: no evidence of acute ischemia    RADIOLOGY     PROCEDURES:  Critical Care performed:   Procedures   MEDICATIONS ORDERED IN ED: Medications  calcium   gluconate 1 g/ 50 mL sodium chloride  IVPB (has no administration in time range)     IMPRESSION / MDM / ASSESSMENT AND PLAN / ED COURSE  I reviewed the triage vital signs and the nursing notes. Patient's presentation is most consistent with acute presentation with potential threat to life or bodily function.  Patient presents with reports of hypocalcemia, we will confirm this here  EKG is reassuring, she denies muscle cramping or paresthesias  Lab work confirms calcium  of 5.9, mild elevation of BUN and creatinine, glucose of 199.  Otherwise lab work not significantly changed from prior  Will give a dose of IV gluconate over 60 minutes.  Patient will require admission, I have consulted the hospitalist team        FINAL CLINICAL IMPRESSION(S) / ED DIAGNOSES   Final diagnoses:  Hypocalcemia     Rx / DC Orders   ED Discharge Orders     None        Note:  This document was prepared using Dragon voice recognition software and may include unintentional dictation errors.   Arlander Charleston, MD 10/16/23 2121

## 2023-10-16 NOTE — ED Triage Notes (Addendum)
 Pt arrives with concerns for a drop in her calcium . Per pt, her calcium  has dropped from 6.9 to 5.8 in a week. Pt was sent by Dr. Auston for evaluation. Pt reports generalized weakness and SOB. Pt denies CP. Pt wear home O2 at 2L. Pt currently on PO antibiotics for UTI.

## 2023-10-16 NOTE — ED Notes (Signed)
 Pt hooked up to 5 lead cardiac monitor. Fall risk bundle in place. Pt daughter at bedside. No further needs at this time. Magen, RN, notified pt is in room.

## 2023-10-16 NOTE — H&P (Signed)
 History and Physical    CALYSSA ZOBRIST FMW:[id number] DOB: 11-14-1945 DOA: 10/16/2023  Referring MD/NP/PA:   PCP: Auston Reyes BIRCH, MD   Patient coming from:  The patient is coming from home.     Chief Complaint: weakness and abnormal lab with hypocalcemia  HPI: MEGYN LENG is a 78 y.o. female with medical history significant of colon cancer (s/p of colon resection), pancreatic cancer (s/p Whipple procedure), thyroid  cancer (s/p for thyroidectomy), renal cancer (s/p of right nephrectomy), breast cancer, HTN, DM, dCHF, hypothyroidism, GERD, depression with anxiety, CKD-3B, obesity, iron  deficiency anemia, left pleural effusion (s/p of thoracentesis yesterday 8/19), small apical pneumothorax post-procedure, who presents with weakness and abnormal lab with hypocalcemia.  Per patient and her daughter at the bedside, patient has generalized weakness for about a week.  She has mild numbness in her lips recently. Pt was seen by PCP who did labs. Her calcium  was found to be low at 5.8. Pt was sent to ED for further evaluation and treatment. Patient had Prolia injection 4 weeks ago. Patient does not have nausea, vomiting, diarrhea or abdominal pain. Patient is currently taking Levaquin since 8/13 for UTI.  Patient was given 3 g fosfomycin yesterday.  Today she reports SOB which is slightly worsening baseline.  She is on home level 2 L oxygen with 99% of saturation in ED.  Patient has no chest pain, cough, fever or chills.  Of note, due to left pleural effusion, patient had thoracentesis with approximately 450 mL blood-tinged fluid removed by Dr. Katha of IR. Pt developed a small apical pneumothorax post-procedure.    Data reviewed independently and ED Course: pt was found to have calcium  5.9, WBC 10.3, renal function close to baseline, temperature normal, blood pressure 107/45, heart rate 108, RR 21, oxygen saturation 99% on 2 L oxygen.  Patient is placed in telemetry bed for observation.  Chest  x-ray: Similar appearance of tiny left apical pneumothorax and moderate left pleural effusion.    EKG: I have personally reviewed.  First the EKG showed sinus rhythm, QTc 493, nonspecific T wave change.  The second EKG showed QTc 507, heart rate 101.   Review of Systems:   General: no fevers, chills, no body weight gain, has fatigue HEENT: no blurry vision, hearing changes or sore throat Respiratory: has dyspnea, no coughing, wheezing CV: no chest pain, no palpitations GI: no nausea, vomiting, abdominal pain, diarrhea, constipation GU: no dysuria, burning on urination, increased urinary frequency, hematuria  Ext: has trace leg edema Neuro: no unilateral weakness, numbness, or tingling, no vision change or hearing loss Skin: no rash, no skin tear. MSK: No muscle spasm, no deformity, no limitation of range of movement in spin Heme: No easy bruising.  Travel history: No recent long distant travel.   Allergy:  Allergies  Allergen Reactions   Statins     Cirrhosis, unable to tolerate    Past Medical History:  Diagnosis Date   Anemia    Anxiety    Colon cancer (HCC)    Diabetes mellitus without complication (HCC)    Family history of adverse reaction to anesthesia    daughter had reaction to anesthesia and was in icu   GERD (gastroesophageal reflux disease)    Hypertension    Hypothyroidism    Kidney cancer, primary, with metastasis from kidney to other site [facility]) 08/26/2014   right   Pancreas cancer (HCC) 2018   Thyroid  cancer [facility]     Past Surgical History:  Procedure Laterality  Date   CHOLECYSTECTOMY     COLON RESECTION     COLONOSCOPY WITH PROPOFOL  N/A 09/01/2015   Procedure: COLONOSCOPY WITH PROPOFOL ;  Surgeon: Lamar ONEIDA Holmes, MD;  Location: [facility] ENDOSCOPY;  Service: Endoscopy;  Laterality: N/A;   CYSTOSCOPY  12/28/2016   Procedure: CYSTOSCOPY;  Surgeon: Ward, Mitzie BROCKS, MD;  Location: [facility] ORS;  Service: Gynecology;;   ESOPHAGOGASTRODUODENOSCOPY (EGD) WITH  PROPOFOL  N/A 09/01/2015   Procedure: ESOPHAGOGASTRODUODENOSCOPY (EGD) WITH PROPOFOL ;  Surgeon: Lamar ONEIDA Holmes, MD;  Location: [facility] ENDOSCOPY;  Service: Endoscopy;  Laterality: N/A;   EUS N/A 06/28/2016   Procedure: FULL UPPER ENDOSCOPIC ULTRASOUND (EUS) RADIAL;  Surgeon: Deward Napoleon, MD;  Location: [facility] ENDOSCOPY;  Service: Endoscopy;  Laterality: N/A;   EXCISION MASS NECK N/A 09/20/2015   Procedure: EXCISION MASS NECK;  Surgeon: Chinita Hasten, MD;  Location: [facility] SURGERY CNTR;  Service: ENT;  Laterality: N/A;   LAPAROSCOPIC BILATERAL SALPINGO OOPHERECTOMY Bilateral 12/28/2016   Procedure: LAPAROSCOPIC BILATERAL SALPINGO OOPHORECTOMY;  Surgeon: Ward, Mitzie BROCKS, MD;  Location: [facility] ORS;  Service: Gynecology;  Laterality: Bilateral;   LAPAROSCOPIC HYSTERECTOMY N/A 12/28/2016   Procedure: HYSTERECTOMY TOTAL LAPAROSCOPIC;  Surgeon: Ward, Mitzie BROCKS, MD;  Location: [facility] ORS;  Service: Gynecology;  Laterality: N/A;   LAPAROSCOPIC LYSIS OF ADHESIONS  12/28/2016   Procedure: LAPAROSCOPIC EXTENSIVE LYSIS OF ADHESIONS;  Surgeon: Ward, Mitzie BROCKS, MD;  Location: [facility] ORS;  Service: Gynecology;;   rt kidney removed Right    THYROGLOSSAL DUCT CYST Right 09/20/2015   Procedure: excision sub mandibular gland right;  Surgeon: Chinita Hasten, MD;  Location: [facility] SURGERY CNTR;  Service: ENT;  Laterality: Right;   THYROID  SURGERY Bilateral    TOTAL KNEE ARTHROPLASTY Right    TOTAL KNEE ARTHROPLASTY Left 08/09/2014   Procedure: TOTAL KNEE ARTHROPLASTY;  Surgeon: Lynwood SHAUNNA Hue, MD;  Location: [facility] ORS;  Service: Orthopedics;  Laterality: Left;    Social History:  reports that she quit smoking about 29 years ago. Her smoking use included cigarettes. She has never used smokeless tobacco. She reports that she does not drink alcohol and does not use drugs.  Family History:  Family History  Problem Relation Age of Onset   Breast cancer Maternal Aunt    Breast cancer Cousin      Prior to Admission medications    Medication Sig Start Date End Date Taking? Authorizing Provider  Accu-Chek FastClix Lancets MISC USE 1 EACH FOUR TIMES DAILY. USE AS INSTRUCTED 11/08/22   [provider]  acetaminophen  (TYLENOL ) 500 MG tablet Take 500-1,000 mg by mouth 2 (two) times daily.    [provider]  acetaminophen  (TYLENOL ) 500 MG tablet Take by mouth.    [provider]  albuterol  (VENTOLIN  HFA) 108 (90 Base) MCG/ACT inhaler Inhale 1 puff into the lungs every 6 (six) hours as needed for wheezing or shortness of breath. 04/07/19   Josette Ade, MD  ALPRAZolam  (XANAX ) 0.25 MG tablet Take by mouth. 10/04/22   [provider]  B-D UF III MINI PEN NEEDLES 31G X 5 MM MISC 4 (four) times daily. as directed 10/31/22   [provider]  benzonatate (TESSALON) 200 MG capsule  10/02/22   [provider]  calcium  acetate (PHOSLO ) 667 MG capsule Take 1 capsule by mouth 2 (two) times daily. 01/01/23   [provider]  cephALEXin  (KEFLEX ) 250 MG capsule  12/24/22   [provider]  chlorpheniramine-HYDROcodone  (TUSSIONEX) 10-8 MG/5ML SHAKE LIQUID AND TAKE 5 ML BY MOUTH EVERY 12 HOURS AS NEEDED FOR  COUGH 10/02/22   [provider]  citalopram  (CELEXA ) 40 MG tablet Take 40 mg by mouth daily. 01/07/19   [provider]  citalopram  (CELEXA ) 40 MG tablet Take 1 tablet by mouth daily. 12/10/22   [provider]  dexamethasone  (DECADRON ) 2 MG tablet TAKE 1 TABLET DAILY WITH BREAKFAST FOR 20 DAYS THEN TAKE 1/2 TABLET DAILY FOR 10 DAYS 11/15/22   [provider]  empagliflozin  (JARDIANCE ) 10 MG TABS tablet Take 1 tablet by mouth daily. 08/29/22   [provider]  Ensure Max Protein (ENSURE MAX PROTEIN) LIQD Take 330 mLs (11 oz total) by mouth 2 (two) times daily between meals. Patient not taking: Reported on 01/10/2023 04/07/19   Josette Ade, MD  Ferrous Fum-Iron  Polysacch 53-53 MG CAPS Take by mouth. 11/25/20   [provider]   ferrous sulfate  325 (65 FE) MG tablet Take 1 tablet (325 mg total) by mouth daily. 04/07/19 04/06/20  Josette Ade, MD  fexofenadine (ALLEGRA) 180 MG tablet Take by mouth. 04/24/19   [provider]  fluconazole (DIFLUCAN) 150 MG tablet Take by mouth. 12/04/22   [provider]  fluticasone  (FLONASE ) 50 MCG/ACT nasal spray Place 2 sprays into both nostrils daily. Patient not taking: Reported on 01/10/2023    [provider]  fluticasone  (FLONASE ) 50 MCG/ACT nasal spray Place into the nose.    [provider]  furosemide  (LASIX ) 40 MG tablet TAKE 1 TABLET BY MOUTH ONCE DAILY AS NEEDED FOR EDEMA HOLD IF SYSTOLIC BLOOD PRESSURE<100 10/04/22   [provider]  HUMALOG  KWIKPEN 100 UNIT/ML KwikPen Inject 0.08 mLs (8 Units total) into the skin 3 (three) times daily. Per sliding scale 04/07/19   Josette Ade, MD  HYDROcodone -acetaminophen  [facility]) 10-325 MG tablet Take by mouth. 09/17/22   [provider]  LAGEVRIO 200 MG CAPS capsule TAKE FOUR CAPSULES BY MOUTH TWO TIMES A DAY FOR FIVE DAYS Patient not taking: Reported on 01/10/2023 10/02/22   [provider]  levothyroxine  (SYNTHROID ) 100 MCG tablet Take by mouth. 11/22/22 11/22/23  [provider]  levothyroxine  (SYNTHROID ) 125 MCG tablet Take by mouth. 11/28/22   [provider]  levothyroxine  (SYNTHROID , LEVOTHROID) 175 MCG tablet Take 175 mcg by mouth daily before breakfast.    [provider]  losartan  (COZAAR ) 25 MG tablet Take 1 tablet by mouth at bedtime. 11/15/22 11/15/23  [provider]  magnesium  oxide (MAG-OX) 400 (240 Mg) MG tablet  10/08/22   [provider]  magnesium  oxide (MAG-OX) 400 MG tablet Take by mouth. 10/08/22   [provider]  metoprolol tartrate (LOPRESSOR) 25 MG tablet  10/01/22   [provider]  pantoprazole  (PROTONIX ) 40 MG tablet Take 40 mg by mouth daily.    [provider]  predniSONE (DELTASONE)  10 MG tablet  10/02/22   [provider]  propranolol (INDERAL) 40 MG tablet  09/19/22   [provider]  sulfamethoxazole -trimethoprim  (BACTRIM ) 400-80 MG tablet TAKE 1 TABLET BY MOUTH DAILY FOR 21 DAYS Patient not taking: Reported on 01/10/2023 11/14/22   [provider]  TRESIBA  FLEXTOUCH 200 UNIT/ML SOPN Inject 26 Units into the skin every evening. 04/07/19   Josette Ade, MD  verapamil (CALAN-SR) 120 MG CR tablet Take by mouth. 11/27/22 11/27/23  [provider]  vitamin B-12 (CYANOCOBALAMIN ) 1000 MCG tablet Take 1,000 mcg by mouth daily. Reported on 07/07/2015    [provider]    Physical Exam: Vitals:   10/16/23 1948 10/16/23 1950  BP:  ROLLEN)  107/45  Pulse:  (!) 108  Resp:  (!) 21  Temp:  98.1 F (36.7 C)  TempSrc:  Oral  SpO2:  99%  Weight: 92.5 kg    General: Not in acute distress HEENT:       Eyes: PERRL, EOMI, no jaundice       ENT: No discharge from the ears and nose, no pharynx injection, no tonsillar enlargement.        Neck: No JVD, no bruit, no mass felt. Heme: No neck lymph node enlargement. Cardiac: S1/S2, RRR, 1/6 systolic murmur, no gallops or rubs. Respiratory: No rales, wheezing, rhonchi or rubs.  Has decreased air movement on the left side GI: Soft, nondistended, nontender, no rebound pain, no organomegaly, BS present. GU: No hematuria Ext: Has trace pitting leg edema bilaterally. 1+DP/PT pulse bilaterally. Musculoskeletal: No joint deformities, No joint redness or warmth, no limitation of ROM in spin. Skin: No rashes.  Neuro: Alert, oriented X3, cranial nerves II-XII grossly intact, moves all extremities normally.  Psych: Patient is not psychotic, no suicidal or hemocidal ideation.  Labs on Admission: I have personally reviewed following labs and imaging studies  CBC: Recent Labs  Lab 10/16/23 1952  WBC 10.3  HGB 10.2*  HCT 34.8*  MCV 94.8  PLT 212   Basic Metabolic Panel: Recent Labs  Lab  10/16/23 1952  NA 139  K 4.3  CL 108  CO2 20*  GLUCOSE 199*  BUN 25*  CREATININE 1.64*  CALCIUM  5.9*  MG 2.0  PHOS 3.4   GFR: Estimated Creatinine Clearance: 31.1 mL/min (A) (by C-G formula based on SCr of 1.64 mg/dL (H)). Liver Function Tests: Recent Labs  Lab 10/16/23 1952  AST 38  ALT 17  ALKPHOS 126  BILITOT 0.6  PROT 6.6  ALBUMIN  3.0*   No results for input(s): LIPASE, AMYLASE in the last 168 hours. No results for input(s): AMMONIA in the last 168 hours. Coagulation Profile: No results for input(s): INR, PROTIME in the last 168 hours. Cardiac Enzymes: No results for input(s): CKTOTAL, CKMB, CKMBINDEX, TROPONINI in the last 168 hours. BNP (last 3 results) No results for input(s): PROBNP in the last 8760 hours. HbA1C: No results for input(s): HGBA1C in the last 72 hours. CBG: No results for input(s): GLUCAP in the last 168 hours. Lipid Profile: No results for input(s): CHOL, HDL, LDLCALC, TRIG, CHOLHDL, LDLDIRECT in the last 72 hours. Thyroid  Function Tests: No results for input(s): TSH, T4TOTAL, FREET4, T3FREE, THYROIDAB in the last 72 hours. Anemia Panel: No results for input(s): VITAMINB12, FOLATE, FERRITIN, TIBC, IRON , RETICCTPCT in the last 72 hours. Urine analysis:    Component Value Date/Time   COLORURINE YELLOW (A) 04/01/2019 1422   APPEARANCEUR HAZY (A) 04/01/2019 1422   LABSPEC 1.018 04/01/2019 1422   PHURINE 5.0 04/01/2019 1422   GLUCOSEU NEGATIVE 04/01/2019 1422   HGBUR NEGATIVE 04/01/2019 1422   BILIRUBINUR NEGATIVE 04/01/2019 1422   KETONESUR NEGATIVE 04/01/2019 1422   PROTEINUR NEGATIVE 04/01/2019 1422   NITRITE NEGATIVE 04/01/2019 1422   LEUKOCYTESUR TRACE (A) 04/01/2019 1422   Sepsis Labs: @LABRCNTIP (procalcitonin:4,lacticidven:4) ) Recent Results (from the past 240 hours)  Body fluid culture w Gram Stain     Status: None (Preliminary result)   Collection Time: 10/15/23 11:46  AM   Specimen: J: PATH Cytology Pleural fluid   E: PATH Cytology Pleural fluid  Result Value Ref Range Status   Specimen Description   Final    FLUID Performed at [facility], [address]  [place], [place] [address]    Special Requests   Final     CYTO PLEU Performed at [facility], [address] [place], [place] [address]    Gram Stain NO WBC SEEN NO ORGANISMS SEEN   Final   Culture   Final    NO GROWTH < 24 HOURS Performed at [facility], [address], [place], [place] [address]    Report Status PENDING  Incomplete     Radiological Exams on Admission:   Assessment/Plan Principal Problem:   Hypocalcemia Active Problems:   Chronic diastolic CHF (congestive heart failure) (HCC)   Pleural effusion on left   Pneumothorax   HTN (hypertension)   Hypothyroidism   Cirrhosis of liver without ascites (HCC)   Type II diabetes mellitus with renal manifestations (HCC)   Iron  deficiency anemia   Chronic kidney disease, stage 3b (HCC)   UTI (urinary tract infection)   Prolonged QT interval   Depression with anxiety   Obesity (BMI 30-39.9)   Assessment and Plan:  Principal Problem:   Hypocalcemia Active Problems:   Chronic diastolic CHF (congestive heart failure) (HCC)   Pleural effusion on left   Pneumothorax   HTN (hypertension)   Hypothyroidism   Cirrhosis of liver without ascites (HCC)   Type II diabetes mellitus with renal manifestations (HCC)   Iron  deficiency anemia   Chronic kidney disease, stage 3b (HCC)   UTI (urinary tract infection)   Prolonged QT interval   Depression with anxiety   Obesity (BMI 30-39.9)      DVT ppx: SCD  Code Status: Full code   Family Communication:   yes, patient's daughter   at bed side.       Disposition Plan:  Anticipate discharge back to previous environment  Consults called: None  Admission status and Level of care: Telemetry Medical:    for obs     Dispo: The patient is from:  Home              Anticipated d/c is to: Home              Anticipated d/c date is: 1 day              Patient currently is not medically stable to d/c.    Severity of Illness:  The appropriate patient status for this patient is OBSERVATION. Observation status is judged to be reasonable and necessary in order to provide the required intensity of service to ensure the patient's safety. The patient's presenting symptoms, physical exam findings, and initial radiographic and laboratory data in the context of their medical condition is felt to place them at decreased risk for further clinical deterioration. Furthermore, it is anticipated that the patient will be medically stable for discharge from the hospital within 2 midnights of admission.        Date of Service 10/16/2023    Caleb Exon [facility]   If 7PM-7AM, please contact night-coverage [email or web address] 10/16/2023, 10:46 PM

## 2023-10-16 NOTE — ED Notes (Signed)
 Waiting on medication to come down from pharmacy for pt

## 2023-10-17 ENCOUNTER — Observation Stay

## 2023-10-17 DIAGNOSIS — R9431 Abnormal electrocardiogram [ECG] [EKG]: Secondary | ICD-10-CM

## 2023-10-17 DIAGNOSIS — F419 Anxiety disorder, unspecified: Secondary | ICD-10-CM | POA: Diagnosis present

## 2023-10-17 DIAGNOSIS — E039 Hypothyroidism, unspecified: Secondary | ICD-10-CM | POA: Diagnosis not present

## 2023-10-17 DIAGNOSIS — E66811 Obesity, class 1: Secondary | ICD-10-CM | POA: Diagnosis present

## 2023-10-17 DIAGNOSIS — K219 Gastro-esophageal reflux disease without esophagitis: Secondary | ICD-10-CM | POA: Diagnosis present

## 2023-10-17 DIAGNOSIS — N183 Chronic kidney disease, stage 3 unspecified: Secondary | ICD-10-CM

## 2023-10-17 DIAGNOSIS — N390 Urinary tract infection, site not specified: Secondary | ICD-10-CM

## 2023-10-17 DIAGNOSIS — J95811 Postprocedural pneumothorax: Secondary | ICD-10-CM | POA: Diagnosis present

## 2023-10-17 DIAGNOSIS — K9089 Other intestinal malabsorption: Secondary | ICD-10-CM | POA: Diagnosis present

## 2023-10-17 DIAGNOSIS — Z87891 Personal history of nicotine dependence: Secondary | ICD-10-CM | POA: Diagnosis not present

## 2023-10-17 DIAGNOSIS — Z85528 Personal history of other malignant neoplasm of kidney: Secondary | ICD-10-CM | POA: Diagnosis not present

## 2023-10-17 DIAGNOSIS — D509 Iron deficiency anemia, unspecified: Secondary | ICD-10-CM | POA: Diagnosis present

## 2023-10-17 DIAGNOSIS — J939 Pneumothorax, unspecified: Secondary | ICD-10-CM | POA: Diagnosis not present

## 2023-10-17 DIAGNOSIS — K7460 Unspecified cirrhosis of liver: Secondary | ICD-10-CM | POA: Diagnosis present

## 2023-10-17 DIAGNOSIS — N1832 Chronic kidney disease, stage 3b: Secondary | ICD-10-CM | POA: Diagnosis present

## 2023-10-17 DIAGNOSIS — I10 Essential (primary) hypertension: Secondary | ICD-10-CM | POA: Diagnosis not present

## 2023-10-17 DIAGNOSIS — I130 Hypertensive heart and chronic kidney disease with heart failure and stage 1 through stage 4 chronic kidney disease, or unspecified chronic kidney disease: Secondary | ICD-10-CM | POA: Diagnosis present

## 2023-10-17 DIAGNOSIS — E890 Postprocedural hypothyroidism: Secondary | ICD-10-CM | POA: Diagnosis present

## 2023-10-17 DIAGNOSIS — Z7984 Long term (current) use of oral hypoglycemic drugs: Secondary | ICD-10-CM | POA: Diagnosis not present

## 2023-10-17 DIAGNOSIS — F32A Depression, unspecified: Secondary | ICD-10-CM | POA: Diagnosis present

## 2023-10-17 DIAGNOSIS — Z6833 Body mass index (BMI) 33.0-33.9, adult: Secondary | ICD-10-CM | POA: Diagnosis not present

## 2023-10-17 DIAGNOSIS — E1122 Type 2 diabetes mellitus with diabetic chronic kidney disease: Secondary | ICD-10-CM | POA: Diagnosis present

## 2023-10-17 DIAGNOSIS — J90 Pleural effusion, not elsewhere classified: Secondary | ICD-10-CM | POA: Diagnosis present

## 2023-10-17 DIAGNOSIS — Z96653 Presence of artificial knee joint, bilateral: Secondary | ICD-10-CM | POA: Diagnosis present

## 2023-10-17 DIAGNOSIS — Z79890 Hormone replacement therapy (postmenopausal): Secondary | ICD-10-CM | POA: Diagnosis not present

## 2023-10-17 DIAGNOSIS — Z794 Long term (current) use of insulin: Secondary | ICD-10-CM | POA: Diagnosis not present

## 2023-10-17 DIAGNOSIS — I5032 Chronic diastolic (congestive) heart failure: Secondary | ICD-10-CM | POA: Diagnosis present

## 2023-10-17 DIAGNOSIS — Z79899 Other long term (current) drug therapy: Secondary | ICD-10-CM | POA: Diagnosis not present

## 2023-10-17 LAB — CBC
HCT: 27.1 % — ABNORMAL LOW (ref 36.0–46.0)
Hemoglobin: 8.5 g/dL — ABNORMAL LOW (ref 12.0–15.0)
MCH: 29.1 pg (ref 26.0–34.0)
MCHC: 31.4 g/dL (ref 30.0–36.0)
MCV: 92.8 fL (ref 80.0–100.0)
Platelets: 164 K/uL (ref 150–400)
RBC: 2.92 MIL/uL — ABNORMAL LOW (ref 3.87–5.11)
RDW: 22.7 % — ABNORMAL HIGH (ref 11.5–15.5)
WBC: 7.3 K/uL (ref 4.0–10.5)
nRBC: 0 % (ref 0.0–0.2)

## 2023-10-17 LAB — GLUCOSE, CAPILLARY
Glucose-Capillary: 253 mg/dL — ABNORMAL HIGH (ref 70–99)
Glucose-Capillary: 178 mg/dL — ABNORMAL HIGH (ref 70–99)
Glucose-Capillary: 227 mg/dL — ABNORMAL HIGH (ref 70–99)
Glucose-Capillary: 203 mg/dL — ABNORMAL HIGH (ref 70–99)
Glucose-Capillary: 305 mg/dL — ABNORMAL HIGH (ref 70–99)

## 2023-10-17 LAB — PROTIME-INR
INR: 1.3 — ABNORMAL HIGH (ref 0.8–1.2)
Prothrombin Time: 16.5 s — ABNORMAL HIGH (ref 11.4–15.2)

## 2023-10-17 LAB — BASIC METABOLIC PANEL WITH GFR
Anion gap: 8 (ref 5–15)
BUN: 25 mg/dL — ABNORMAL HIGH (ref 8–23)
CO2: 22 mmol/L (ref 22–32)
Calcium: 6 mg/dL — CL (ref 8.9–10.3)
Chloride: 109 mmol/L (ref 98–111)
Creatinine, Ser: 1.58 mg/dL — ABNORMAL HIGH (ref 0.44–1.00)
GFR, Estimated: 33 mL/min — ABNORMAL LOW (ref >60)
Glucose, Bld: 221 mg/dL — ABNORMAL HIGH (ref 70–99)
Potassium: 4.7 mmol/L (ref 3.5–5.1)
Sodium: 139 mmol/L (ref 135–145)

## 2023-10-17 LAB — AMMONIA: Ammonia: 29 umol/L (ref 9–35)

## 2023-10-17 LAB — APTT: aPTT: 32 s (ref 24–36)

## 2023-10-17 MED ORDER — ADULT MULTIVITAMIN W/MINERALS CH
1.0000 | ORAL_TABLET | Freq: Every day | ORAL | Status: DC
  Administered 2023-10-18 – 2023-10-19 (×2): 1 via ORAL
  Filled 2023-10-17 (×2): qty 1

## 2023-10-17 MED ORDER — CALCIUM CARBONATE 1250 (500 CA) MG PO TABS
1.0000 | ORAL_TABLET | Freq: Three times a day (TID) | ORAL | Status: DC
  Administered 2023-10-17 – 2023-10-18 (×4): 1250 mg via ORAL
  Filled 2023-10-17 (×4): qty 1

## 2023-10-17 MED ORDER — DARBEPOETIN ALFA 200 MCG/0.4ML IJ SOSY
200.0000 ug | PREFILLED_SYRINGE | Freq: Once | INTRAMUSCULAR | Status: AC
  Administered 2023-10-17: 200 ug via SUBCUTANEOUS
  Filled 2023-10-17: qty 0.4

## 2023-10-17 MED ORDER — ENSURE PLUS HIGH PROTEIN PO LIQD
237.0000 mL | Freq: Two times a day (BID) | ORAL | Status: DC
  Administered 2023-10-18 – 2023-10-19 (×3): 237 mL via ORAL

## 2023-10-17 NOTE — Progress Notes (Signed)
 PROGRESS NOTE    Rebecca Kirby  FMW:[id number] DOB: May 28, 1945 DOA: 10/16/2023 PCP: Auston Reyes BIRCH, MD  Chief Complaint  Patient presents with   Abnormal Lab    Hospital Course:  Elveria KANDICE Oto and 78 year old female with significant history of malignancy.  She has history of colon cancer status post colon resection, pancreatic cancer status post Whipple, thyroid  cancer status post thyroidectomy, renal cancer status post right nephrectomy, breast cancer, all genetic testing WNL.  She also has hypertension, diabetes, diastolic CHF, hypothyroidism, GERD, depression, anxiety, CKD stage IIIb, obesity class I, iron  deficiency anemia, left pleural effusion status post thoracentesis 8/19 with subsequent small apical pneumothorax.  She presents with generalized weakness and hypocalcemia at the urging of her PCP who recently did labs.  Patient had recent Prolia injection 4 weeks ago and was taking Levaquin since 8/13 for UTI.  She was also given 3 g fosfomycin on 8/19.  On arrival she was short of breath but on her home O2 2 L.  Subjective: This morning patient reports she started to feel better.  No acute events overnight.   Objective: Vitals:   10/17/23 0116 10/17/23 0422 10/17/23 0840 10/17/23 1432  BP: (!) 135/54 (!) 122/48 (!) 120/50 124/62  Pulse: 93 86 84 83  Resp:  16 16 18   Temp:  98.7 F (37.1 C) 97.9 F (36.6 C) 98 F (36.7 C)  TempSrc:    Oral  SpO2: 98% 96% 98% 96%  Weight:      Height:        Intake/Output Summary (Last 24 hours) at 10/17/2023 1542 Last data filed at 10/17/2023 1424 Gross per 24 hour  Intake 335.19 ml  Output --  Net 335.19 ml   Filed Weights   10/16/23 1948 10/16/23 2353  Weight: 92.5 kg 83.7 kg    Examination: General exam: Appears calm and comfortable, NAD  Respiratory system: No work of breathing, symmetric chest wall expansion Cardiovascular system: S1 & S2 heard, RRR.  Gastrointestinal system: Abdomen is nondistended, soft and  nontender.  Neuro: Alert and oriented. No focal neurological deficits. Extremities: Symmetric, expected ROM Skin: No rashes, lesions Psychiatry: Demonstrates appropriate judgement and insight. Mood & affect appropriate for situation.   Assessment & Plan:  Principal Problem:   Hypocalcemia Active Problems:   Chronic diastolic CHF (congestive heart failure) (HCC)   Pleural effusion on left   Pneumothorax   HTN (hypertension)   Hypothyroidism   Cirrhosis of liver without ascites (HCC)   Type II diabetes mellitus with renal manifestations (HCC)   Iron  deficiency anemia   Chronic kidney disease, stage 3b (HCC)   UTI (urinary tract infection)   Prolonged QT interval   Depression with anxiety   Obesity (BMI 30-39.9)    Hypocalcemia - Calcium  5.9 in the ED - Patient has multiple risk factors including malabsorption given Whipple, prior thyroidectomy, recent use of Prolia - Given creatinine clearance of 29, would not recommend Prolia in the future for this patient.  Hypocalcemia is a blackbox warning for this medication with reduced creatinine clearance. she will need close outpatient follow-up to consider alternative medication. - Magnesium  2.0, Phos 3.4 - Status post 2 g calcium  gluconate - PTH, vitamin D , urine calcium : Pending - Continue calcium  supplementation.  Recheck in AM.  Weakness - Likely secondary to hypocalcemia - PT eval's ordered and pending  Chronic diastolic CHF - 2D echo 2023: EF over 55%, grade 2 diastolic dysfunction.  Trace leg edema and BNP of 94 on arrival -  Continue with home dose Lasix   Pleural effusion on left - Outpatient thoracentesis 8/19.  450 cc blood-tinged fluid drained - Follow studies, cultures currently negative. - Continue oxygen  Chronic hypoxia - On 2 L at home, occasionally on room air.  Has conserving device.  Apical pneumothorax - Sequelae of thoracentesis 8/19 - Repeat CXR shows stability  Hypertension - Continue current  meds  Hypothyroidism - Continue Synthroid   Cirrhosis of liver without ascites - Stable.  Continue current meds  Type 2 diabetes with renal manifestations - Continue sliding scale insulin  - Basal/bolus, titrate as needed  Iron  deficiency anemia - Patient's baseline hemoglobin close to 8. - Hemoglobin on arrival 10.2, has downtrended this morning.  Suspect initial sample was dehydrated.  Patient reports recently water intake has decreased.  Encouraged oral hydration - Continue iron  supplement - Patient is due for EPO today, gets weekly injections. Will order 1 x dose -Repeat hemoglobin in a.m.  CKD stage IIIb - Renal function appears close to baseline at this time - Continue to monitor closely - Avoid nephrotoxic medications when able  UTI - Patient has been taking Levaquin since 8/13.  Received 3 g fosfomycin 8/19. - She has completed treatment.  Will discontinue antibiotics - Had QT prolongation on arrival.  Fluoroquinolone discontinued as above  Prolonged QT interval - Discontinue Levaquin as above - Also on BuSpar, Cymbalta, Remeron at home.  Will repeat EKG today and gradually resume medications  Depression with anxiety - Continue Xanax   Class I obesity BMI 33 - Outpatient follow up for lifestyle modification and risk factor management  History of colon cancer History of pancreatic cancer History of breast cancer Renal cancer History of thyroid  cancer - Reports genetic testing has been negative - Follow-up with oncology as needed.   DVT prophylaxis: SCDs, avoid AC given down trending Hgb   Code Status: Full Code Disposition: Inpatient pending resolution and hypocalcemia.  Consultants:    Procedures:    Antimicrobials:  Anti-infectives (From admission, onward)    None       Data Reviewed: I have personally reviewed following labs and imaging studies CBC: Recent Labs  Lab 10/16/23 1952 10/17/23 0331  WBC 10.3 7.3  HGB 10.2* 8.5*  HCT 34.8*  27.1*  MCV 94.8 92.8  PLT 212 164   Basic Metabolic Panel: Recent Labs  Lab 10/16/23 1952 10/17/23 0331  NA 139 139  K 4.3 4.7  CL 108 109  CO2 20* 22  GLUCOSE 199* 221*  BUN 25* 25*  CREATININE 1.64* 1.58*  CALCIUM  5.9* 6.0*  MG 2.0  --   PHOS 3.4  --    GFR: Estimated Creatinine Clearance: 29.1 mL/min (A) (by C-G formula based on SCr of 1.58 mg/dL (H)). Liver Function Tests: Recent Labs  Lab 10/16/23 1952  AST 38  ALT 17  ALKPHOS 126  BILITOT 0.6  PROT 6.6  ALBUMIN  3.0*   CBG: Recent Labs  Lab 10/16/23 2354 10/17/23 0839 10/17/23 1125  GLUCAP 253* 178* 227*    Recent Results (from the past 240 hours)  Body fluid culture w Gram Stain     Status: None (Preliminary result)   Collection Time: 10/15/23 11:46 AM   Specimen: J: PATH Cytology Pleural fluid   E: PATH Cytology Pleural fluid  Result Value Ref Range Status   Specimen Description   Final    FLUID Performed at [facility], [address] [place], [place] [address]    Special Requests   Final  CYTO PLEU Performed at [facility], [address] [place], [place] [address]    Gram Stain NO WBC SEEN NO ORGANISMS SEEN   Final   Culture   Final    NO GROWTH 2 DAYS Performed at [facility], [address], [place], [place] [address]    Report Status PENDING  Incomplete     Radiology Studies: DG Chest Port 1 View Result Date: 10/17/2023 CLINICAL DATA:  Pneumothorax. EXAM: PORTABLE CHEST 1 VIEW COMPARISON:  10/16/2023 FINDINGS: Pleural line identified in the left apex on the previous study is no longer evident. Left base collapse/consolidation with moderate effusion is similar. The cardio pericardial silhouette is enlarged. Right upper lobe calcified granuloma evident as confirmed on CT of 10/18/2016. No acute bony abnormality. Telemetry leads overlie the chest. IMPRESSION: 1. No evidence for left-sided pneumothorax on the current study. 2. Persistent left base  collapse/consolidation with moderate effusion. Electronically Signed   By: Camellia Candle M.D.   On: 10/17/2023 06:13   DG Chest 1 View Result Date: 10/16/2023 CLINICAL DATA:  Left thoracentesis.  History of pneumothorax. EXAM: CHEST  1 VIEW COMPARISON:  10/15/2023 FINDINGS: Moderate left pleural effusion is similar to prior. Probable associated left base collapse/consolidation. Tiny left apical pneumothorax is similar. The cardio pericardial silhouette is enlarged. Right lung remains clear. IMPRESSION: Similar appearance of tiny left apical pneumothorax and moderate left pleural effusion. Electronically Signed   By: Camellia Candle M.D.   On: 10/16/2023 16:06    Scheduled Meds:  ALPRAZolam   0.25 mg Oral QHS   calcium  acetate  667 mg Oral BID WC   calcium  carbonate  1 tablet Oral TID WC   cholecalciferol   1,000 Units Oral Daily   cyanocobalamin   1,000 mcg Oral Daily   dicyclomine   10 mg Oral BID   empagliflozin   10 mg Oral Daily   ferrous sulfate   325 mg Oral Q breakfast   furosemide   20 mg Oral Daily   insulin  aspart  0-5 Units Subcutaneous QHS   insulin  aspart  0-9 Units Subcutaneous TID WC   insulin  glargine  40 Units Subcutaneous QHS   levothyroxine   200 mcg Oral Q0600   loratadine   10 mg Oral Daily   losartan   25 mg Oral QHS   magnesium  oxide  400 mg Oral BID   pantoprazole   40 mg Oral BID   Continuous Infusions:   LOS: 0 days  MDM: Patient is high risk for one or more organ failure.  They necessitate ongoing hospitalization for continued IV therapies and subsequent lab monitoring. Total time spent interpreting labs and vitals, reviewing the medical record, coordinating care amongst consultants and care team members, directly assessing and discussing care with the patient and/or family: 55 min  Kashia Brossard, DO [facility]  To contact the attending physician between 7A-7P please use Epic Chat. To contact the covering physician during after hours 7P-7A, please review Amion.   10/17/2023, 3:42 PM   *This document has been created with the assistance of dictation software. Please excuse typographical errors. *

## 2023-10-17 NOTE — Progress Notes (Signed)
 Initial Nutrition Assessment  DOCUMENTATION CODES:   Obesity unspecified  INTERVENTION:   Ensure Plus High Protein po BID, each supplement provides 350 kcal and 20 grams of protein   MVI po daily   Liberalize diet   Pt at high refeed risk; recommend monitor potassium, magnesium  and phosphorus labs daily until stable  Daily weights   NUTRITION DIAGNOSIS:   Increased nutrient needs related to chronic illness as evidenced by estimated needs.  GOAL:   Patient will meet greater than or equal to 90% of their needs  MONITOR:   PO intake, Supplement acceptance, Labs, Weight trends, I & O's, Skin  REASON FOR ASSESSMENT:   Malnutrition Screening Tool    ASSESSMENT:   78 y.o. 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  Pt s/p thoracentesis 8/19 with output  Met with pt in room today. Pt reports fairly good appetite and oral intake pta and in hospital. Pt reports eating 100% of meals in hospital. Pt reports a recent 30lb weight loss pta. Per chart, pt is down 19lbs(10%) over the past 3 months; this is significant weight loss. Pt reports that she has been drinking chocolate Ensure at home; pt agreeable to continue this in hospital. RD will liberalize the heart healthy portion of pt's diet as this is restrictive of  protein. Pt is at refeed risk. Pt with h/o distal pancreatectomy; pt denies any diarrhea or loose stools.   Medications reviewed and include: phoslo , oscal, D3, B12, bentyl , jardiance , ferrous sulfate , lasix , insulin , synthroid , Mg oxide, protonix   Labs reviewed: K 4.7 wnl, BUN 25(H), creat 1.58(H), Ca 6.0(L) Hgb 8.5(L), Hct 27.1(L) Cbgs- 227, 178 x 48 hrs AIC 7.4(H)- 2021  NUTRITION - FOCUSED PHYSICAL EXAM:  Flowsheet Row Most Recent Value  Orbital Region No depletion  Upper Arm Region No depletion  Thoracic and Lumbar Region No depletion  Buccal Region No depletion  Temple Region Mild depletion  Clavicle Bone Region Mild depletion  Clavicle and Acromion Bone Region Mild depletion  Scapular Bone Region No depletion  Dorsal Hand No depletion  Patellar Region Mild depletion  Anterior Thigh Region No depletion  Posterior Calf Region No depletion  Edema (RD Assessment) Mild  Hair Reviewed  Eyes Reviewed  Mouth Reviewed  Skin Reviewed  Nails Reviewed   Diet Order:   Diet Order             Diet heart healthy/carb modified Fluid consistency: Thin  Diet effective now                  EDUCATION NEEDS:   Education needs have been addressed  Skin:  Skin Assessment: Reviewed RN Assessment  Last BM:  8/20  Height:   Ht Readings from Last 1 Encounters:  10/16/23 5' 3 (1.6 m)    Weight:   Wt Readings from Last 1 Encounters:  10/16/23 83.7 kg    Ideal Body Weight:  52.2 kg  BMI:  Body mass index  is 32.69 kg/m.  Estimated Nutritional Needs:   Kcal:  1700-2000kcal/day  Protein:  85-100g/day  Fluid:  1.4-1.6L/day  Rebecca Shams MS, RD, LDN If unable to be reached, please send secure chat to RD inpatient available from 8:00a-4:00p daily

## 2023-10-17 NOTE — Evaluation (Signed)
 Physical Therapy Evaluation Patient Details Name: Rebecca Kirby MRN: [id number] DOB: Apr 06, 1945 Today's Date: 10/17/2023  History of Present Illness  Pt is an 78 year old female presents with weakness and abnormal lab with hypocalcemia.    PMH significant for colon cancer (s/p of colon resection), pancreatic cancer (s/p Whipple procedure), thyroid  cancer (s/p for thyroidectomy), renal cancer (s/p of right nephrectomy), breast cancer, HTN, DM, dCHF, hypothyroidism, GERD, depression with anxiety, CKD-3B, obesity, iron  deficiency anemia, left pleural effusion (s/p of thoracentesis yesterday 8/19), small apical pneumothorax post-procedure  Clinical Impression  Pt pleasant and eager to get up and do a prolonged bout of ambulation.  SpO2 98% on 2L on arrival, maintains mid 90s on room air with bed mobility, etc - she requested to try ambulation on room air - did well (200 ft mostly w/o AD) but did have some DOE/fatigue and SpO2 dropped to 84%, quickly back to mid/upper 90s on 2L at rest.  Pt moves well and showed good confidence and balance with prolonged ambulation effort.  Will benefit from continued PT to insure safe d/c and address minimal functional limitations.        If plan is discharge home, recommend the following: A little help with walking and/or transfers;Assist for transportation;Help with stairs or ramp for entrance   Can travel by private vehicle        Equipment Recommendations None recommended by PT  Recommendations for Other Services       Functional Status Assessment Patient has had a recent decline in their functional status and demonstrates the ability to make significant improvements in function in a reasonable and predictable amount of time.     Precautions / Restrictions Precautions Precautions: Fall Recall of Precautions/Restrictions: Intact Restrictions Weight Bearing Restrictions Per Provider Order: No      Mobility  Bed Mobility Overal bed mobility:  Independent                  Transfers Overall transfer level: Modified independent Equipment used: None Transfers: Sit to/from Stand Sit to Stand: Supervision           General transfer comment: Pt able to rise to standing with good confidence and no safety issues    Ambulation/Gait Ambulation/Gait assistance: Supervision, Contact guard assist Gait Distance (Feet): 200 Feet Assistive device: Rolling walker (2 wheels), None         General Gait Details: Pt was eager to try ambulation w/o O2 to see how she handled it.  She was able to ambulate with community appropriat speed, no LOBs and good overall confidence.  First 50 ft with walker, then 150 ft w/o AD and only incedental UE use on rail.  Pt did have some fatigue and SpO2 dropped from high 90s to mid 80s with the effort.  Quickly back to high 90s on 2L after <1 minute of rest.  Stairs            Wheelchair Mobility     Tilt Bed    Modified Rankin (Stroke Patients Only)       Balance Overall balance assessment: Needs assistance Sitting-balance support: Feet supported Sitting balance-Leahy Scale: Good     Standing balance support: Bilateral upper extremity supported, Reliant on assistive device for balance, During functional activity Standing balance-Leahy Scale: Good Standing balance comment: good balance with walker, no LOBs but mild guarding without UEs  Pertinent Vitals/Pain Pain Assessment Pain Assessment: No/denies pain    Home Living Family/patient expects to be discharged to:: Private residence Living Arrangements: Alone Available Help at Discharge: Family;Available 24 hours/day Type of Home: Mobile home Home Access: Ramped entrance       Home Layout: One level Home Equipment: Agricultural consultant (2 wheels);Shower seat;Rollator (4 wheels);Transport chair;Cane - quad;Grab bars - tub/shower Additional Comments: home o2    Prior Function Prior Level  of Function : History of Falls (last six months)             Mobility Comments: amb with AD, but regularly does not need in the home; 2 falls in May and June ADLs Comments: daugther assist with meds; cooks/cleans/drives     Extremity/Trunk Assessment   Upper Extremity Assessment Upper Extremity Assessment: Overall WFL for tasks assessed    Lower Extremity Assessment Lower Extremity Assessment: Overall WFL for tasks assessed       Communication   Communication Communication: No apparent difficulties    Cognition Arousal: Alert Behavior During Therapy: WFL for tasks assessed/performed                             Following commands: Intact       Cueing Cueing Techniques: Verbal cues     General Comments General comments (skin integrity, edema, etc.): spo2 >90% on 2L via [place] throughout, HR 90s bpm after mobility    Exercises     Assessment/Plan    PT Assessment Patient needs continued PT services  PT Problem List Decreased strength;Decreased range of motion;Decreased activity tolerance;Decreased balance;Decreased mobility;Decreased safety awareness       PT Treatment Interventions DME instruction;Gait training;Stair training;Functional mobility training;Therapeutic activities;Therapeutic exercise;Balance training;Cognitive remediation;Patient/family education    PT Goals (Current goals can be found in the Care Plan section)  Acute Rehab PT Goals Patient Stated Goal: go home tomorrow PT Goal Formulation: With patient Time For Goal Achievement: 10/17/23 Potential to Achieve Goals: Good    Frequency Min 1X/week     Co-evaluation               AM-PAC PT 6 Clicks Mobility  Outcome Measure Help needed turning from your back to your side while in a flat bed without using bedrails?: None Help needed moving from lying on your back to sitting on the side of a flat bed without using bedrails?: None Help needed moving to and from a bed to a  chair (including a wheelchair)?: None Help needed standing up from a chair using your arms (e.g., wheelchair or bedside chair)?: None Help needed to walk in hospital room?: A Little Help needed climbing 3-5 steps with a railing? : A Little 6 Click Score: 22    End of Session Equipment Utilized During Treatment: Gait belt;Oxygen Activity Tolerance: Patient tolerated treatment well;Patient limited by fatigue Patient left: with bed alarm set;with call bell/phone within reach   PT Visit Diagnosis: Muscle weakness (generalized) (M62.81);Difficulty in walking, not elsewhere classified (R26.2)    Time: 1550-1610 PT Time Calculation (min) (ACUTE ONLY): 20 min   Charges:   PT Evaluation $PT Eval Low Complexity: 1 Low PT Treatments $Gait Training: 8-22 mins PT General Charges $$ ACUTE PT VISIT: 1 Visit         Carmin JONELLE Deed, DPT 10/17/2023, 4:52 PM

## 2023-10-17 NOTE — Care Management Obs Status (Signed)
 MEDICARE OBSERVATION STATUS NOTIFICATION   Patient Details  Name: Rebecca Kirby MRN: [id number] Date of Birth: 09-28-1945   Medicare Observation Status Notification Given:  No (did not want a copy)    Rebecca Kirby 10/17/2023, 12:32 PM

## 2023-10-17 NOTE — Inpatient Diabetes Management (Signed)
 Inpatient Diabetes Program Recommendations  AACE/ADA: New Consensus Statement on Inpatient Glycemic Control (2015)  Target Ranges:  Prepandial:   less than 140 mg/dL      Peak postprandial:   less than 180 mg/dL (1-2 hours)      Critically ill patients:  140 - 180 mg/dL   Lab Results  Component Value Date   GLUCAP 253 (H) 10/16/2023   HGBA1C 7.4 (H) 03/19/2019    Review of Glycemic Control  Latest Reference Range & Units 10/16/23 23:54  Glucose-Capillary 70 - 99 mg/dL 746 (H)  (H): Data is abnormally high Diabetes history: DM Outpatient Diabetes medications:  Per last visit with Dr. Solum- endocrinology: Tresiba  U100 insulin  - 60 units qHS (except 90 units on evening of an evening after that she has iron  infusions with steroids) Humalog  U100 insulin  - 18 units before breakfast / lunch + SSI of 2 units per blood glucose of 50 > 150. Humalog  U100 insulin  - 24 units before supper + SSI of 2 units per blood glucose of 50 > 150. Holds Humalog  if pre-meal blood glucose is 70 or less  Current orders for Inpatient glycemic control:  Lantus  40 units daily Jardiance  10 mg daily Novolog  0-9 units tid with meals and HS  Inpatient Diabetes Program Recommendations:    Please consider adding Novolog  5 units tid with meals to cover meal intake (hold if patient eats less than 50% or NPO).   Thanks,  Randall Bullocks, RN, BC-ADM Inpatient Diabetes Coordinator Pager [phone]  (8a-5p)

## 2023-10-17 NOTE — TOC CM/SW Note (Signed)
 Transition of Care [facility]) - Inpatient Brief Assessment   Patient Details  Name: LANEY LOUDERBACK MRN: [id number] Date of Birth: 03-03-1945  Transition of Care [facility]) CM/SW Contact:    Corean ONEIDA Haddock, RN Phone Number: 10/17/2023, 9:50 AM   Clinical Narrative:    Transition of Care [facility]) Screening Note   Patient Details  Name: DAMIAH MCDONALD Date of Birth: 25-Aug-1945   Transition of Care [facility]) CM/SW Contact:    Corean ONEIDA Haddock, RN Phone Number: 10/17/2023, 9:50 AM    Transition of Care Department [facility]) has reviewed patient and no TOC needs have been identified at this time.. If new patient transition needs arise, please place a TOC consult.   Transition of Care Asessment: Insurance and Status: Insurance coverage has been reviewed Patient has primary care physician: Yes     Prior/Current Home Services: No current home services Social Drivers of Health Review: SDOH reviewed no interventions necessary Readmission risk has been reviewed: Yes Transition of care needs: no transition of care needs at this time

## 2023-10-17 NOTE — Evaluation (Signed)
 Occupational Therapy Evaluation Patient Details Name: Rebecca Kirby MRN: [id number] DOB: 01/06/1946 Today's Date: 10/17/2023   History of Present Illness   Pt is an 78 year old female presents with weakness and abnormal lab with hypocalcemia.    PMH significant for colon cancer (s/p of colon resection), pancreatic cancer (s/p Whipple procedure), thyroid  cancer (s/p for thyroidectomy), renal cancer (s/p of right nephrectomy), breast cancer, HTN, DM, dCHF, hypothyroidism, GERD, depression with anxiety, CKD-3B, obesity, iron  deficiency anemia, left pleural effusion (s/p of thoracentesis 8/19), small apical pneumothorax post-procedure     Clinical Impressions Chart reviewed, pt greeted semi supine in bed, agreeable to OT evaluation. Pt is alert and oriented x4. PTA pt reports she is generally MOD I-I in ADL/IADL; she was living at her daughters house after a fall in May until approx  48month ago when she returned to her house. She reports she typically amb with no AD. Pt presents with deficits in activity tolerance, balance, endurance, affecting safe and optimal ADL completion. Bed mobility completed with supervision, STS with CGA, amb with RW (pt reports she feels weaker/unsteady than usual) with supervision-CGA approx 100'. CGA required for LB dressing. Discussion/education re: safe ADL completion with DME/AE for energy conservation. PT is left in bed side chair, all needs met. Spo2 >90% on 2 L via [place] and HR 90s bpm post mobility. OT will follow to facilitate optimal ADL/functional mobility performance.      If plan is discharge home, recommend the following:   A little help with bathing/dressing/bathroom;Assist for transportation;Assistance with cooking/housework     Functional Status Assessment   Patient has had a recent decline in their functional status and demonstrates the ability to make significant improvements in function in a reasonable and predictable amount of time.      Equipment Recommendations   None recommended by OT;Other (comment) (pt has recommended equipment)     Recommendations for Other Services         Precautions/Restrictions   Precautions Precautions: Fall Recall of Precautions/Restrictions: Intact Restrictions Weight Bearing Restrictions Per Provider Order: No     Mobility Bed Mobility Overal bed mobility: Modified Independent                  Transfers Overall transfer level: Needs assistance Equipment used: None Transfers: Sit to/from Stand Sit to Stand: Contact guard assist                  Balance Overall balance assessment: Needs assistance Sitting-balance support: Feet supported Sitting balance-Leahy Scale: Good     Standing balance support: Bilateral upper extremity supported, Reliant on assistive device for balance, During functional activity Standing balance-Leahy Scale: Fair                             ADL either performed or assessed with clinical judgement   ADL Overall ADL's : Needs assistance/impaired Eating/Feeding: Set up   Grooming: Sitting;Set up               Lower Body Dressing: Contact guard assist;Sitting/lateral leans Lower Body Dressing Details (indicate cue type and reason): donn slip in shoes Toilet Transfer: Supervision/safety;Rolling walker (2 wheels);Contact guard assist Toilet Transfer Details (indicate cue type and reason): simulated         Functional mobility during ADLs: Supervision/safety;Contact guard assist;Rolling walker (2 wheels) (approx 100' with RW)       Vision Patient Visual Report: No change from baseline  Perception         Praxis         Pertinent Vitals/Pain Pain Assessment Pain Assessment: No/denies pain     Extremity/Trunk Assessment Upper Extremity Assessment Upper Extremity Assessment: Overall WFL for tasks assessed   Lower Extremity Assessment Lower Extremity Assessment: Generalized weakness        Communication Communication Communication: No apparent difficulties   Cognition Arousal: Alert Behavior During Therapy: WFL for tasks assessed/performed Cognition: No apparent impairments                               Following commands: Intact       Cueing  General Comments   Cueing Techniques: Verbal cues  spo2 >90% on 2L via [place] throughout, HR 90s bpm after mobility   Exercises Other Exercises Other Exercises: edu re: role of OT, role of rehab, discharge recommendations   Shoulder Instructions      Home Living Family/patient expects to be discharged to:: Private residence Living Arrangements: Alone Available Help at Discharge: Family;Available 24 hours/day Type of Home: Mobile home Home Access: Ramped entrance     Home Layout: One level     Bathroom Shower/Tub: Walk-in shower;Tub/shower unit   Bathroom Toilet: Standard (has bsc over it) Bathroom Accessibility: Yes   Home Equipment: Agricultural consultant (2 wheels);Shower seat;Rollator (4 wheels);Transport chair;Cane - quad;Grab bars - tub/shower   Additional Comments: home o2      Prior Functioning/Environment Prior Level of Function : History of Falls (last six months)             Mobility Comments: amb with AD; 2 falls in May and June ADLs Comments: daugther assist with meds; cooks/cleans/drives    OT Problem List: Decreased strength;Decreased activity tolerance;Impaired balance (sitting and/or standing);Decreased knowledge of use of DME or AE   OT Treatment/Interventions: Self-care/ADL training;Balance training;Therapeutic exercise;Therapeutic activities;Energy conservation;DME and/or AE instruction;Patient/family education      OT Goals(Current goals can be found in the care plan section)   Acute Rehab OT Goals Patient Stated Goal: go home OT Goal Formulation: With patient Time For Goal Achievement: 10/31/23 Potential to Achieve Goals: Good ADL Goals Pt Will Perform Grooming:  with modified independence;standing;sitting Pt Will Perform Lower Body Dressing: with modified independence;sit to/from stand;sitting/lateral leans Pt Will Transfer to Toilet: with modified independence;ambulating Pt Will Perform Toileting - Clothing Manipulation and hygiene: with modified independence;sitting/lateral leans;sit to/from stand   OT Frequency:  Min 2X/week    Co-evaluation              AM-PAC OT 6 Clicks Daily Activity     Outcome Measure Help from another person eating meals?: None Help from another person taking care of personal grooming?: None Help from another person toileting, which includes using toliet, bedpan, or urinal?: None Help from another person bathing (including washing, rinsing, drying)?: A Little Help from another person to put on and taking off regular upper body clothing?: None Help from another person to put on and taking off regular lower body clothing?: A Little 6 Click Score: 22   End of Session Equipment Utilized During Treatment: Rolling walker (2 wheels);Oxygen Nurse Communication: Mobility status  Activity Tolerance: Patient tolerated treatment well Patient left: in chair;with call bell/phone within reach;with chair alarm set  OT Visit Diagnosis: Other abnormalities of gait and mobility (R26.89);Muscle weakness (generalized) (M62.81)                Time: 8877-8849 OT  Time Calculation (min): 28 min Charges:  OT General Charges $OT Visit: 1 Visit OT Evaluation $OT Eval Moderate Complexity: 1 Mod  Therisa Sheffield, OTD OTR/L  10/17/23, 1:13 PM

## 2023-10-18 DIAGNOSIS — J939 Pneumothorax, unspecified: Secondary | ICD-10-CM | POA: Diagnosis not present

## 2023-10-18 DIAGNOSIS — I10 Essential (primary) hypertension: Secondary | ICD-10-CM | POA: Diagnosis not present

## 2023-10-18 DIAGNOSIS — E039 Hypothyroidism, unspecified: Secondary | ICD-10-CM | POA: Diagnosis not present

## 2023-10-18 LAB — COMPREHENSIVE METABOLIC PANEL WITH GFR
ALT: 12 U/L (ref 0–44)
AST: 23 U/L (ref 15–41)
Albumin: 2.5 g/dL — ABNORMAL LOW (ref 3.5–5.0)
Alkaline Phosphatase: 115 U/L (ref 38–126)
Anion gap: 8 (ref 5–15)
BUN: 25 mg/dL — ABNORMAL HIGH (ref 8–23)
CO2: 24 mmol/L (ref 22–32)
Calcium: 5.9 mg/dL — CL (ref 8.9–10.3)
Chloride: 107 mmol/L (ref 98–111)
Creatinine, Ser: 1.28 mg/dL — ABNORMAL HIGH (ref 0.44–1.00)
GFR, Estimated: 42 mL/min — ABNORMAL LOW (ref >60)
Glucose, Bld: 86 mg/dL (ref 70–99)
Potassium: 4.3 mmol/L (ref 3.5–5.1)
Sodium: 139 mmol/L (ref 135–145)
Total Bilirubin: 0.5 mg/dL (ref 0.0–1.2)
Total Protein: 5.6 g/dL — ABNORMAL LOW (ref 6.5–8.1)

## 2023-10-18 LAB — CBC WITH DIFFERENTIAL/PLATELET
Abs Granulocyte: 5.7 K/uL (ref 1.5–6.5)
Abs Immature Granulocytes: 0.02 K/uL (ref 0.00–0.07)
Basophils Absolute: 0 K/uL (ref 0.0–0.1)
Basophils Relative: 1 %
Eosinophils Absolute: 0.1 K/uL (ref 0.0–0.5)
Eosinophils Relative: 1 %
HCT: 28.3 % — ABNORMAL LOW (ref 36.0–46.0)
Hemoglobin: 8.6 g/dL — ABNORMAL LOW (ref 12.0–15.0)
Immature Granulocytes: 0 %
Lymphocytes Relative: 10 %
Lymphs Abs: 0.7 K/uL (ref 0.7–4.0)
MCH: 28.2 pg (ref 26.0–34.0)
MCHC: 30.4 g/dL (ref 30.0–36.0)
MCV: 92.8 fL (ref 80.0–100.0)
Monocytes Absolute: 0.5 K/uL (ref 0.1–1.0)
Monocytes Relative: 7 %
Neutro Abs: 5.7 K/uL (ref 1.7–7.7)
Neutrophils Relative %: 81 %
Platelets: 177 K/uL (ref 150–400)
RBC: 3.05 MIL/uL — ABNORMAL LOW (ref 3.87–5.11)
RDW: 22.3 % — ABNORMAL HIGH (ref 11.5–15.5)
Smear Review: NORMAL
WBC: 7 K/uL (ref 4.0–10.5)
nRBC: 0 % (ref 0.0–0.2)

## 2023-10-18 LAB — MAGNESIUM: Magnesium: 1.9 mg/dL (ref 1.7–2.4)

## 2023-10-18 LAB — ACID FAST SMEAR (AFB, MYCOBACTERIA): Acid Fast Smear: NEGATIVE

## 2023-10-18 LAB — GLUCOSE, CAPILLARY
Glucose-Capillary: 205 mg/dL — ABNORMAL HIGH (ref 70–99)
Glucose-Capillary: 387 mg/dL — ABNORMAL HIGH (ref 70–99)

## 2023-10-18 LAB — PHOSPHORUS: Phosphorus: 3.4 mg/dL (ref 2.5–4.6)

## 2023-10-18 MED ORDER — CALCIUM CARBONATE 1250 (500 CA) MG PO TABS
1000.0000 mg | ORAL_TABLET | Freq: Three times a day (TID) | ORAL | Status: DC
  Administered 2023-10-18 – 2023-10-19 (×3): 2500 mg via ORAL
  Filled 2023-10-18 (×5): qty 2

## 2023-10-18 MED ORDER — DOCUSATE SODIUM 100 MG PO CAPS
100.0000 mg | ORAL_CAPSULE | Freq: Once | ORAL | Status: AC
  Administered 2023-10-18: 100 mg via ORAL
  Filled 2023-10-18: qty 1

## 2023-10-18 MED ORDER — SODIUM CHLORIDE 0.9 % IV SOLN: 1.0000 mg/kg/h | INTRAVENOUS | Status: DC

## 2023-10-18 MED ORDER — CALCIUM GLUCONATE-NACL 2-0.675 GM/100ML-% IV SOLN
2.0000 g | Freq: Once | INTRAVENOUS | Status: AC
  Administered 2023-10-18: 2000 mg via INTRAVENOUS
  Filled 2023-10-18 (×2): qty 100

## 2023-10-18 MED ORDER — INSULIN ASPART 100 UNIT/ML IJ SOLN
5.0000 [IU] | Freq: Three times a day (TID) | INTRAMUSCULAR | Status: DC
  Administered 2023-10-19: 5 [IU] via SUBCUTANEOUS
  Filled 2023-10-18 (×3): qty 1

## 2023-10-18 MED ORDER — MAGNESIUM SULFATE 2 GM/50ML IV SOLN
2.0000 g | Freq: Once | INTRAVENOUS | Status: AC
  Administered 2023-10-18: 2 g via INTRAVENOUS
  Filled 2023-10-18: qty 50

## 2023-10-18 NOTE — TOC Initial Note (Signed)
 Transition of Care [facility]) - Initial/Assessment Note    Patient Details  Name: Rebecca Kirby MRN: [id number] Date of Birth: 09-07-1945  Transition of [facility]) CM/SW Contact:    Asberry CHRISTELLA Jaksch, RN Phone Number: 10/18/2023, 3:59 PM  Clinical Narrative:                 Admitted for: Hypocalcemia Admitted from: Home  PCP: Auston Reyes BIRCH, MD Current home health/prior home health/DME: Per patient she owns a transport chair, shower seat, RW, cane, and manual WC.  Spoke with patient at bedside. She states that she lives alone in a retirement community. She drives herself, or her daughter or son in law will drive her to appointments. She states her neighbors and family are supportive in her care. I discussed HH recommendations. She is open to PT but declines OT. Referral sent to [facility] with [facility]. Daughter Arland to provide DC transport.    Expected Discharge Plan: Home w Home Health Services Barriers to Discharge: Continued Medical Work up   Patient Goals and CMS Choice Patient states their goals for this hospitalization and ongoing recovery are:: Going home          Expected Discharge Plan and Services                         DME Arranged: N/A         HH Arranged: PT (Patient refused HH OT) HH Agency: [facility] Date HH Agency Contacted: 10/18/23 Time HH Agency Contacted: [id number] Representative spoke with at [facility] Agency: [place]  Prior Living Arrangements/Services   Lives with:: Self Patient language and need for interpreter reviewed:: Yes Do you feel safe going back to the place where you live?: Yes        Care giver support system in place?: Yes (comment) Current home services: DME (Per patient she owns a transport chair, shower seat, RW, cane,  and manual WC)    Activities of Daily Living   ADL Screening (condition at time of admission) Independently performs ADLs?: No Does the patient have a NEW difficulty with  bathing/dressing/toileting/self-feeding that is expected to last >3 days?: No Does the patient have a NEW difficulty with getting in/out of bed, walking, or climbing stairs that is expected to last >3 days?: No Does the patient have a NEW difficulty with communication that is expected to last >3 days?: No Is the patient deaf or have difficulty hearing?: Yes Does the patient have difficulty seeing, even when wearing glasses/contacts?: No Does the patient have difficulty concentrating, remembering, or making decisions?: Yes  Permission Sought/Granted                  Emotional Assessment Appearance:: Appears stated age     Orientation: : Oriented to Self, Oriented to Place, Oriented to  Time, Oriented to Situation      Admission diagnosis:  Hypocalcemia [E83.51] Patient Active Problem List   Diagnosis Date Noted   Hypocalcemia 10/16/2023   Type II diabetes mellitus with renal manifestations (HCC) 10/16/2023   Chronic kidney disease, stage 3b (HCC) 10/16/2023   Obesity (BMI 30-39.9) 10/16/2023   Chronic diastolic CHF (congestive heart failure) (HCC) 10/16/2023   HTN (hypertension) 10/16/2023   Depression with anxiety 10/16/2023   Pleural effusion on left 10/16/2023   Pneumothorax 10/16/2023   UTI (urinary tract infection) 10/16/2023   Prolonged QT interval 10/16/2023   Weakness    Acute kidney injury superimposed on CKD (  HCC)    Hypothyroidism    Renal cell carcinoma (HCC)    Pneumonia due to COVID-19 virus 03/19/2019   Acute hypoxemic respiratory failure due to COVID-19 [facility]) 03/19/2019   Hyponatremia 03/19/2019   Lung nodule, solitary 07/03/2016   Cancer of right kidney (HCC) 11/03/2015   Iron  deficiency anemia 09/02/2015   Cirrhosis (HCC) 09/02/2015   Cirrhosis of liver without ascites (HCC) 09/02/2015   Malignant neoplasm of colon (HCC) 03/16/2015   Diabetes mellitus (HCC) 03/16/2015   Broken ulna 03/16/2015   Personal history of healed traumatic fracture  03/16/2015   BP (high blood pressure) 03/16/2015   Impaired renal function 03/16/2015   Avitaminosis D 03/16/2015   Lump in thyroid  01/05/2015   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   Total knee replacement status 08/09/2014   Artificial knee joint present 08/09/2014   Anemia in chronic illness 02/04/2014   PCP:  Auston Reyes BIRCH, MD Pharmacy:   [facility]) - [place], [place] - [address] [place] [address] [place] [address] Phone: [phone] Fax: [phone]  [id number] - [place], [place] - [address] AT [facility] [address] [place] [address] Phone: [phone] Fax: [phone]     Social Drivers of Health (SDOH) Social History: SDOH Screenings   Food Insecurity: No Food Insecurity (10/17/2023)  Housing: Low Risk  (10/17/2023)  Transportation Needs: No Transportation Needs (10/17/2023)  Utilities: Not At Risk (10/17/2023)  Depression (PHQ2-9): Low Risk  (01/14/2023)  Financial Resource Strain: Low Risk  (10/09/2023)   Received from [facility]  Social Connections: Moderately Integrated (10/17/2023)  Tobacco Use: Medium Risk (10/16/2023)   SDOH Interventions:     Readmission Risk Interventions    10/18/2023    3:56 PM  Readmission Risk Prevention Plan  Transportation Screening Complete  Medication Review (RN Care Manager) Complete  PCP or Specialist appointment within 3-5 days of discharge Complete  HRI or Home Care Consult Complete  SW Recovery Care/Counseling Consult Complete  Palliative Care Screening Not Applicable  Skilled Nursing Facility Not Applicable

## 2023-10-18 NOTE — Progress Notes (Signed)
 Occupational Therapy Treatment Patient Details Name: Rebecca Kirby MRN: [id number] DOB: [birth year] Today's Date: 10/18/2023   History of present illness Pt is an 78 year old female presents with weakness and abnormal lab with hypocalcemia.    PMH significant for colon cancer (s/p of colon resection), pancreatic cancer (s/p Whipple procedure), thyroid  cancer (s/p for thyroidectomy), renal cancer (s/p of right nephrectomy), breast cancer, HTN, DM, dCHF, hypothyroidism, GERD, depression with anxiety, CKD-3B, obesity, iron  deficiency anemia, left pleural effusion (s/p of thoracentesis yesterday 8/19), small apical pneumothorax post-procedure   OT comments  Chart reviewed to date, pt greeted semi supine in bed, agreeable to OT tx session targeting improving functional activity tolerance in prep for ADL tasks. Pt continues to perform ADL/functional mobility below PLOF but demonstrates improvements in overall ADL and functional mobility tolerance and independence. Pt is left in bedside chair, all needs met. OT will continue to follow.       If plan is discharge home, recommend the following:  A little help with bathing/dressing/bathroom;Assist for transportation;Assistance with cooking/housework   Equipment Recommendations  None recommended by OT;Other (comment) (pt has recommended equipment)    Recommendations for Other Services      Precautions / Restrictions Precautions Precautions: Fall Recall of Precautions/Restrictions: Intact Restrictions Weight Bearing Restrictions Per Provider Order: No       Mobility Bed Mobility Overal bed mobility: Modified Independent                  Transfers Overall transfer level: Needs assistance Equipment used: None Transfers: Sit to/from Stand Sit to Stand: Supervision                 Balance Overall balance assessment: Needs assistance Sitting-balance support: Feet supported Sitting balance-Leahy Scale: Good     Standing  balance support: Bilateral upper extremity supported, Reliant on assistive device for balance, During functional activity Standing balance-Leahy Scale: Good                             ADL either performed or assessed with clinical judgement   ADL Overall ADL's : Needs assistance/impaired     Grooming: Supervision/safety;Standing;Wash/dry hands Grooming Details (indicate cue type and reason): sink level             Lower Body Dressing: Contact guard assist;Sitting/lateral leans Lower Body Dressing Details (indicate cue type and reason): underwera, donn slip on shoes Toilet Transfer: Supervision/safety;Ambulation;Rolling walker (2 wheels);Regular Toilet;Grab bars   Toileting- Clothing Manipulation and Hygiene: Supervision/safety;Sitting/lateral lean Toileting - Clothing Manipulation Details (indicate cue type and reason): continent urine     Functional mobility during ADLs: Supervision/safety;Rolling walker (2 wheels) (approx 100' with RW)      Extremity/Trunk Assessment              Vision       Perception     Praxis     Communication Communication Communication: No apparent difficulties   Cognition Arousal: Alert Behavior During Therapy: WFL for tasks assessed/performed Cognition: No apparent impairments                               Following commands: Intact        Cueing   Cueing Techniques: Verbal cues  Exercises Other Exercises Other Exercises: edu re: role of OT, role of rehab, discharge recommendations, safe ADL completion with DME/AE    Shoulder Instructions  General Comments spo2 >90% on 2 L via [place] throughout    Pertinent Vitals/ Pain       Pain Assessment Pain Assessment: No/denies pain  Home Living                                          Prior Functioning/Environment              Frequency  Min 2X/week        Progress Toward Goals  OT Goals(current goals can now be  found in the care plan section)  Progress towards OT goals: Progressing toward goals  Acute Rehab OT Goals Time For Goal Achievement: 10/31/23  Plan      Co-evaluation                 AM-PAC OT 6 Clicks Daily Activity     Outcome Measure   Help from another person eating meals?: None Help from another person taking care of personal grooming?: None Help from another person toileting, which includes using toliet, bedpan, or urinal?: None Help from another person bathing (including washing, rinsing, drying)?: A Little Help from another person to put on and taking off regular upper body clothing?: None Help from another person to put on and taking off regular lower body clothing?: A Little 6 Click Score: 22    End of Session Equipment Utilized During Treatment: Rolling walker (2 wheels);Oxygen  OT Visit Diagnosis: Other abnormalities of gait and mobility (R26.89);Muscle weakness (generalized) (M62.81)   Activity Tolerance Patient tolerated treatment well   Patient Left in chair;with call bell/phone within reach;with chair alarm set   Nurse Communication          Time: 8887-8859 OT Time Calculation (min): 28 min  Charges: OT General Charges $OT Visit: 1 Visit OT Treatments $Self Care/Home Management : 8-22 mins $Therapeutic Activity: 8-22 mins  Therisa Sheffield, OTD OTR/L  10/18/23, 1:49 PM

## 2023-10-18 NOTE — Progress Notes (Deleted)
   10/18/23 0427  Assess: MEWS Score  Temp 98.4 F (36.9 C)  BP (!) 158/53  MAP (mmHg) 82  Pulse Rate 100  Resp 20  SpO2 97 %  O2 Device Nasal Cannula  Assess: MEWS Score  MEWS Temp 0  MEWS Systolic 0  MEWS Pulse 0  MEWS RR 0  MEWS LOC 0  MEWS Score 0  MEWS Score Color Green  Notify: Charge Nurse/RN  Name of Charge Nurse/RN Notified Dawn RN  Provider Notification  Provider Name/Title Jesus NP  Date Provider Notified 10/18/23  Time Provider Notified [phone]  Method of Notification Page  Notification Reason Critical Result (Calcium  5.9)  Provider response No new orders  Date of Provider Response 10/18/23  Time of Provider Response 0500  Assess: SIRS CRITERIA  SIRS Temperature  0  SIRS Respirations  0  SIRS Pulse 1  SIRS WBC 0  SIRS Score Sum  1

## 2023-10-18 NOTE — Progress Notes (Signed)
 PROGRESS NOTE    Rebecca Kirby  FMW:[id number] DOB: 09-Jul-1945 DOA: 10/16/2023 PCP: Auston Reyes BIRCH, MD  Chief Complaint  Patient presents with   Abnormal Lab    Hospital Course:  Rebecca Kirby and 78 year old female with significant history of malignancy.  She has history of colon cancer status post colon resection, pancreatic cancer status post Whipple, thyroid  cancer status post thyroidectomy, renal cancer status post right nephrectomy, breast cancer, all genetic testing WNL.  She also has hypertension, diabetes, diastolic CHF, hypothyroidism, GERD, depression, anxiety, CKD stage IIIb, obesity class I, iron  deficiency anemia, left pleural effusion status post thoracentesis 8/19 with subsequent small apical pneumothorax.  She presents with generalized weakness and hypocalcemia at the urging of her PCP who recently did labs.  Patient had recent Prolia injection 4 weeks ago and was taking Levaquin since 8/13 for UTI.  She was also given 3 g fosfomycin on 8/19.  On arrival she was short of breath but on her home O2 2 L.  Subjective: No acute events overnight.  Calcium  low again this morning.  Calcium  gluconate was ordered.  Patient denies any chest pain.   Objective: Vitals:   10/18/23 0427 10/18/23 0430 10/18/23 0731 10/18/23 1354  BP: (!) 158/53  (!) 136/57 (!) 129/42  Pulse: 100  84 89  Resp: 20  19 18   Temp: 98.4 F (36.9 C)  98.1 F (36.7 C) 98.5 F (36.9 C)  TempSrc: Oral  Oral Oral  SpO2: 97%  98% 100%  Weight:  84.8 kg    Height:        Intake/Output Summary (Last 24 hours) at 10/18/2023 1359 Last data filed at 10/18/2023 0900 Gross per 24 hour  Intake 360 ml  Output --  Net 360 ml   Filed Weights   10/16/23 1948 10/16/23 2353 10/18/23 0430  Weight: 92.5 kg 83.7 kg 84.8 kg    Examination: General exam: Appears calm and comfortable, NAD  Respiratory system: No work of breathing, symmetric chest wall expansion Cardiovascular system: S1 & S2 heard, RRR.   Gastrointestinal system: Abdomen is nondistended, soft and nontender.  Neuro: Alert and oriented. No focal neurological deficits. Extremities: Symmetric, expected ROM Skin: No rashes, lesions Psychiatry: Demonstrates appropriate judgement and insight. Mood & affect appropriate for situation.   Assessment & Plan:  Principal Problem:   Hypocalcemia Active Problems:   Chronic diastolic CHF (congestive heart failure) (HCC)   Pleural effusion on left   Pneumothorax   HTN (hypertension)   Hypothyroidism   Cirrhosis of liver without ascites (HCC)   Type II diabetes mellitus with renal manifestations (HCC)   Iron  deficiency anemia   Chronic kidney disease, stage 3b (HCC)   UTI (urinary tract infection)   Prolonged QT interval   Depression with anxiety   Obesity (BMI 30-39.9)    Hypocalcemia - Patient has multiple risk factors including malabsorption given Whipple, prior thyroidectomy, recent use of Prolia - Corrected calcium  7.1 today, does not appear to be responding significantly to calcium  gluconate.  Will continue to supplement until levels stabilize - Status post 2 g calcium  gluconate x 2.  Will also increase p.o. elemental calcium  to 1000 mg 3 times daily today.  Given patient's abdominal surgeries she is high risk for malabsorption and thus unlikely to respond expectedly.  IV supplementation ongoing. -Keep mag above 2 - Given creatinine clearance of 29, would not recommend Prolia in the future for this patient.  Hypocalcemia is a blackbox warning for this medication with reduced creatinine  clearance. she will need close outpatient follow-up to consider alternative medication. - Continue to follow calcium  levels - Ionized calcium , PTH, vitamin D , urine calcium : Pending  Weakness - Likely secondary to hypocalcemia -- Cont PT/OT, may not need HH   Chronic diastolic CHF - 2D echo 2023: EF over 55%, grade 2 diastolic dysfunction.  Trace leg edema and BNP of 94 on arrival -  Continue with home dose Lasix   Pleural effusion on left - Outpatient thoracentesis 8/19.  450 cc blood-tinged fluid drained - Follow studies, cultures currently negative. - Continue oxygen  Chronic hypoxia - On 2 L at home, occasionally on room air.  Has conserving device.  Apical pneumothorax - Sequelae of thoracentesis 8/19 - Repeat CXR shows stability  Hypertension - Continue current meds  Hypothyroidism - Continue Synthroid   Cirrhosis of liver without ascites - Stable.  Continue current meds  Type 2 diabetes with renal manifestations - Continue sliding scale insulin  - Basal/bolus, titrate as needed  Iron  deficiency anemia - Patient's baseline hemoglobin close to 8. - Hemoglobin on arrival 10.2. Suspect initial sample was dehydrated.  Patient reports recently water intake has decreased.  Encouraged oral hydration - Continue iron  supplement -Patient received EPO x 1 8/21 consistent with her weekly injections - Hemoglobin remained stable today  CKD stage IIIb -Presently renal function is improved beyond baseline - Continue to monitor closely - Avoid nephrotoxic medications when able  UTI - Patient has been taking Levaquin since 8/13.  Received 3 g fosfomycin 8/19. - She has completed treatment.  Will discontinue antibiotics - Had QT prolongation on arrival.  Fluoroquinolone discontinued as above  Prolonged QT interval - Discontinue Levaquin as above - Also on BuSpar, Cymbalta, Remeron at home.  QTc today 492, continue to hold offending medications.  Expect further resolution in QTc when calcium  resolves towards normal  Depression with anxiety - Continue Xanax   Class I obesity BMI 33 - Outpatient follow up for lifestyle modification and risk factor management  History of colon cancer History of pancreatic cancer History of breast cancer Renal cancer History of thyroid  cancer - Reports genetic testing has been negative - Follow-up with oncology as  needed.   DVT prophylaxis: SCDs, avoid AC given down trending Hgb   Code Status: Full Code Disposition: Inpatient pending resolution and hypocalcemia.  Consultants:    Procedures:    Antimicrobials:  Anti-infectives (From admission, onward)    None       Data Reviewed: I have personally reviewed following labs and imaging studies CBC: Recent Labs  Lab 10/16/23 1952 10/17/23 0331 10/18/23 0325  WBC 10.3 7.3 7.0  NEUTROABS  --   --  5.7  HGB 10.2* 8.5* 8.6*  HCT 34.8* 27.1* 28.3*  MCV 94.8 92.8 92.8  PLT 212 164 177   Basic Metabolic Panel: Recent Labs  Lab 10/16/23 1952 10/17/23 0331 10/18/23 0325  NA 139 139 139  K 4.3 4.7 4.3  CL 108 109 107  CO2 20* 22 24  GLUCOSE 199* 221* 86  BUN 25* 25* 25*  CREATININE 1.64* 1.58* 1.28*  CALCIUM  5.9* 6.0* 5.9*  MG 2.0  --  1.9  PHOS 3.4  --  3.4   GFR: Estimated Creatinine Clearance: 36.2 mL/min (A) (by C-G formula based on SCr of 1.28 mg/dL (H)). Liver Function Tests: Recent Labs  Lab 10/16/23 1952 10/18/23 0325  AST 38 23  ALT 17 12  ALKPHOS 126 115  BILITOT 0.6 0.5  PROT 6.6 5.6*  ALBUMIN  3.0* 2.5*  CBG: Recent Labs  Lab 10/16/23 2354 10/17/23 0839 10/17/23 1125 10/17/23 1609 10/17/23 2203  GLUCAP 253* 178* 227* 203* 305*    Recent Results (from the past 240 hours)  Body fluid culture w Gram Stain     Status: None (Preliminary result)   Collection Time: 10/15/23 11:46 AM   Specimen: J: PATH Cytology Pleural fluid   E: PATH Cytology Pleural fluid  Result Value Ref Range Status   Specimen Description   Final    FLUID Performed at [facility], [address], [place], [place] [address]    Special Requests   Final     CYTO PLEU Performed at [facility], [address] [place], [place] [address]    Gram Stain NO WBC SEEN NO ORGANISMS SEEN   Final   Culture   Final    NO GROWTH 3 DAYS Performed at [facility], [address], [place], [place] [address]     Report Status PENDING  Incomplete     Radiology Studies: DG Chest Port 1 View Result Date: 10/17/2023 CLINICAL DATA:  Pneumothorax. EXAM: PORTABLE CHEST 1 VIEW COMPARISON:  10/16/2023 FINDINGS: Pleural line identified in the left apex on the previous study is no longer evident. Left base collapse/consolidation with moderate effusion is similar. The cardio pericardial silhouette is enlarged. Right upper lobe calcified granuloma evident as confirmed on CT of 10/18/2016. No acute bony abnormality. Telemetry leads overlie the chest. IMPRESSION: 1. No evidence for left-sided pneumothorax on the current study. 2. Persistent left base collapse/consolidation with moderate effusion. Electronically Signed   By: Camellia Candle M.D.   On: 10/17/2023 06:13   DG Chest 1 View Result Date: 10/16/2023 CLINICAL DATA:  Left thoracentesis.  History of pneumothorax. EXAM: CHEST  1 VIEW COMPARISON:  10/15/2023 FINDINGS: Moderate left pleural effusion is similar to prior. Probable associated left base collapse/consolidation. Tiny left apical pneumothorax is similar. The cardio pericardial silhouette is enlarged. Right lung remains clear. IMPRESSION: Similar appearance of tiny left apical pneumothorax and moderate left pleural effusion. Electronically Signed   By: Camellia Candle M.D.   On: 10/16/2023 16:06    Scheduled Meds:  ALPRAZolam   0.25 mg Oral QHS   calcium  acetate  667 mg Oral BID WC   cholecalciferol   1,000 Units Oral Daily   cyanocobalamin   1,000 mcg Oral Daily   dicyclomine   10 mg Oral BID   empagliflozin   10 mg Oral Daily   feeding supplement  237 mL Oral BID BM   ferrous sulfate   325 mg Oral Q breakfast   furosemide   20 mg Oral Daily   insulin  aspart  0-5 Units Subcutaneous QHS   insulin  aspart  0-9 Units Subcutaneous TID WC   insulin  aspart  5 Units Subcutaneous TID WC   insulin  glargine  40 Units Subcutaneous QHS   levothyroxine   200 mcg Oral Q0600   loratadine   10 mg Oral Daily   losartan   25 mg  Oral QHS   magnesium  oxide  400 mg Oral BID   multivitamin with minerals  1 tablet Oral Daily   pantoprazole   40 mg Oral BID   Continuous Infusions:   LOS: 1 day  MDM: Patient is high risk for one or more organ failure.  They necessitate ongoing hospitalization for continued IV therapies and subsequent lab monitoring. Total time spent interpreting labs and vitals, reviewing the medical record, coordinating care amongst consultants and care team members, directly assessing and discussing care with the patient and/or  family: 52 min  Lorane Poland, DO [facility]  To contact the attending physician between 7A-7P please use Epic Chat. To contact the covering physician during after hours 7P-7A, please review Amion.  10/18/2023, 1:59 PM   *This document has been created with the assistance of dictation software. Please excuse typographical errors. *

## 2023-10-18 NOTE — Plan of Care (Signed)

## 2023-10-18 NOTE — Significant Event (Signed)
       CROSS COVER NOTE  NAME: Rebecca Kirby MRN: [id number] DOB : 15-Nov-1945 ATTENDING PHYSICIAN: Leesa Kast, DO    Date of Service   10/18/2023   HPI/Events of Note   Message received from nurse via secure chat Patient admitted 8-20 for hypocalcemia. HX HTN, CHF, CKD, DM, Gerd, Depression, CA=colon, thyroid , pancreatic, breast. Critical lab, calcium  5.9   HPI reviewed   Interventions   Assessment/Plan: Corrected calcium  7.1 2 gm calcium  gluconate IVPB ordered        Rebecca Kirby [facility] NP [facility] Cross Cover 7pm-7am - check amion for availability Pager [phone]

## 2023-10-18 NOTE — Progress Notes (Signed)
 Mobility Specialist - Progress Note    10/18/23 1545  Mobility  Activity Ambulated with assistance  Level of Assistance Standby assist, set-up cues, supervision of patient - no hands on  Assistive Device Front wheel walker  Distance Ambulated (ft) 24 ft  Activity Response Tolerated well  Mobility visit 1 Mobility  Mobility Specialist Start Time (ACUTE ONLY) 1518  Mobility Specialist Stop Time (ACUTE ONLY) 1522  Mobility Specialist Time Calculation (min) (ACUTE ONLY) 4 min   Pt sitting EOB upon entry, utilizing RA-- unsure of duration spent on RA. Pt STS to RW and amb to/from the bathroom with close supervision-- tolerated well. Pt amb to the recliner, left seated with alarm set and needs within reach.  America Silvan Mobility Specialist 10/18/23 3:49 PM

## 2023-10-18 NOTE — Inpatient Diabetes Management (Signed)
 Inpatient Diabetes Program Recommendations  AACE/ADA: New Consensus Statement on Inpatient Glycemic Control (2015)  Target Ranges:  Prepandial:   less than 140 mg/dL      Peak postprandial:   less than 180 mg/dL (1-2 hours)      Critically ill patients:  140 - 180 mg/dL   Lab Results  Component Value Date   GLUCAP 305 (H) 10/17/2023   HGBA1C 7.4 (H) 03/19/2019    Review of Glycemic Control  Latest Reference Range & Units 10/17/23 16:09 10/17/23 22:03  Glucose-Capillary 70 - 99 mg/dL 796 (H) 694 (H)   Diabetes history: DM Outpatient Diabetes medications:  Per last visit with Dr. Solum- endocrinology: Tresiba  U100 insulin  - 60 units qHS (except 90 units on evening of an evening after that she has iron  infusions with steroids) Humalog  U100 insulin  - 18 units before breakfast / lunch + SSI of 2 units per blood glucose of 50 > 150. Humalog  U100 insulin  - 24 units before supper + SSI of 2 units per blood glucose of 50 > 150. Holds Humalog  if pre-meal blood glucose is 70 or less  Current orders for Inpatient glycemic control:  Lantus  40 units daily Jardiance  10 mg daily Novolog  0-9 units tid with meals and HS Inpatient Diabetes Program Recommendations:   Please consider adding Novolog  5 units tid with meals to cover meal intake (hold if patient eats less than 50% or NPO).   Thanks,  Randall Bullocks, RN, BC-ADM Inpatient Diabetes Coordinator Pager [phone]  (8a-5p)

## 2023-10-18 NOTE — Plan of Care (Signed)
  Problem: Coping: Goal: Ability to adjust to condition or change in health will improve Outcome: Progressing   Problem: Fluid Volume: Goal: Ability to maintain a balanced intake and output will improve Outcome: Progressing   Problem: Health Behavior/Discharge Planning: Goal: Ability to identify and utilize available resources and services will improve Outcome: Progressing   Problem: Health Behavior/Discharge Planning: Goal: Ability to manage health-related needs will improve Outcome: Progressing   Problem: Metabolic: Goal: Ability to maintain appropriate glucose levels will improve Outcome: Progressing   Problem: Tissue Perfusion: Goal: Adequacy of tissue perfusion will improve Outcome: Progressing

## 2023-10-18 NOTE — Care Management Important Message (Signed)
 Important Message  Patient Details  Name: Rebecca Kirby MRN: [id number] Date of Birth: [birth year]   Important Message Given:  Yes - Medicare IM     Rojelio SHAUNNA Rattler 10/18/2023, 1:14 PM

## 2023-10-19 LAB — CBC WITH DIFFERENTIAL/PLATELET
Abs Granulocyte: 5.6 K/uL (ref 1.5–6.5)
Abs Immature Granulocytes: 0.04 K/uL (ref 0.00–0.07)
Basophils Absolute: 0 K/uL (ref 0.0–0.1)
Basophils Relative: 1 %
Eosinophils Absolute: 0.1 K/uL (ref 0.0–0.5)
Eosinophils Relative: 2 %
HCT: 27.9 % — ABNORMAL LOW (ref 36.0–46.0)
Hemoglobin: 8.6 g/dL — ABNORMAL LOW (ref 12.0–15.0)
Immature Granulocytes: 1 %
Lymphocytes Relative: 10 %
Lymphs Abs: 0.7 K/uL (ref 0.7–4.0)
MCH: 27.9 pg (ref 26.0–34.0)
MCHC: 30.8 g/dL (ref 30.0–36.0)
MCV: 90.6 fL (ref 80.0–100.0)
Monocytes Absolute: 0.5 K/uL (ref 0.1–1.0)
Monocytes Relative: 7 %
Neutro Abs: 5.6 K/uL (ref 1.7–7.7)
Neutrophils Relative %: 79 %
Platelets: 181 K/uL (ref 150–400)
RBC: 3.08 MIL/uL — ABNORMAL LOW (ref 3.87–5.11)
RDW: 21.6 % — ABNORMAL HIGH (ref 11.5–15.5)
Smear Review: NORMAL
WBC: 7 K/uL (ref 4.0–10.5)
nRBC: 0 % (ref 0.0–0.2)

## 2023-10-19 LAB — CALCIUM, IONIZED: Calcium, Ionized, Serum: 3.4 mg/dL — ABNORMAL LOW (ref 4.5–5.6)

## 2023-10-19 LAB — COMPREHENSIVE METABOLIC PANEL WITH GFR
ALT: 14 U/L (ref 0–44)
AST: 32 U/L (ref 15–41)
Albumin: 2.5 g/dL — ABNORMAL LOW (ref 3.5–5.0)
Alkaline Phosphatase: 127 U/L — ABNORMAL HIGH (ref 38–126)
Anion gap: 4 — ABNORMAL LOW (ref 5–15)
BUN: 25 mg/dL — ABNORMAL HIGH (ref 8–23)
CO2: 30 mmol/L (ref 22–32)
Calcium: 6.6 mg/dL — ABNORMAL LOW (ref 8.9–10.3)
Chloride: 105 mmol/L (ref 98–111)
Creatinine, Ser: 1.14 mg/dL — ABNORMAL HIGH (ref 0.44–1.00)
GFR, Estimated: 49 mL/min — ABNORMAL LOW (ref >60)
Glucose, Bld: 103 mg/dL — ABNORMAL HIGH (ref 70–99)
Potassium: 4.3 mmol/L (ref 3.5–5.1)
Sodium: 139 mmol/L (ref 135–145)
Total Bilirubin: 0.7 mg/dL (ref 0.0–1.2)
Total Protein: 5.8 g/dL — ABNORMAL LOW (ref 6.5–8.1)

## 2023-10-19 LAB — BODY FLUID CULTURE W GRAM STAIN
Culture: NO GROWTH
Gram Stain: NONE SEEN

## 2023-10-19 LAB — GLUCOSE, CAPILLARY
Glucose-Capillary: 81 mg/dL (ref 70–99)
Glucose-Capillary: 187 mg/dL — ABNORMAL HIGH (ref 70–99)

## 2023-10-19 LAB — PHOSPHORUS: Phosphorus: 3.9 mg/dL (ref 2.5–4.6)

## 2023-10-19 LAB — MAGNESIUM: Magnesium: 2.5 mg/dL — ABNORMAL HIGH (ref 1.7–2.4)

## 2023-10-19 MED ORDER — CALCIUM CARBONATE 1250 (500 CA) MG PO TABS: 2500.0000 mg | ORAL_TABLET | Freq: Three times a day (TID) | ORAL | 0 refills | Status: DC

## 2023-10-19 MED ORDER — DOCUSATE SODIUM 100 MG PO CAPS
100.0000 mg | ORAL_CAPSULE | Freq: Two times a day (BID) | ORAL | Status: DC
  Administered 2023-10-19: 100 mg via ORAL
  Filled 2023-10-19: qty 1

## 2023-10-19 NOTE — Discharge Summary (Signed)
 DISCHARGE SUMMARY    Rebecca Kirby FMW:[id number] DOB: 1946/01/24 DOA: 10/16/2023  PCP: Auston Reyes BIRCH, MD  Admit date: 10/16/2023 Discharge date: 10/19/2023   Recommendations for Outpatient Follow-up:  Follow-up with PCP within 1 week to recheck calcium  level and EKG for QTc monitoring   Hospital Course: Rebecca Kirby and 78 year old female with significant history of malignancy.  She has history of colon cancer status post colon resection, pancreatic cancer status post Whipple, thyroid  cancer status post thyroidectomy, renal cancer status post right nephrectomy, breast cancer, all genetic testing WNL.  She also has hypertension, diabetes, diastolic CHF, hypothyroidism, GERD, depression, anxiety, CKD stage IIIb, obesity class I, iron  deficiency anemia, left pleural effusion status post thoracentesis 8/19 with subsequent small apical pneumothorax.  She presents with generalized weakness and hypocalcemia at the urging of her PCP who recently did labs.  Patient had recent Prolia injection 4 weeks ago and was taking Levaquin since 8/13 for UTI.  She was also given 3 g fosfomycin on 8/19.  On arrival she was short of breath but on her home O2 2 L.  She also endorsed generalized weakness and numbness and tingling around her mouth.  QTc on arrival also found to be prolonged. Patient received multiple doses of calcium  gluconate as well as an increase in her p.o. calcium .  By 8/23 calcium  resolved to a corrected calcium  of 7.8 and patient had no further symptoms.  QTc also resolved down to 472.  Patient is well-established with outpatient follow-up, and is sure she will see her PCP this week for recheck calcium  level and review of her medications for further medication management.  I had extensive discussion with the patient as well as with her daughter on day of discharge and they are in agreement with the care plan.    Hypocalcemia - Patient has multiple risk factors including malabsorption  given Whipple, colon resection, prior thyroidectomy, CKD, recent use of Prolia - Calcium  has been slow to correct.  Today corrected calcium  is 7.8 - Will continue with higher dose p.o. elemental calcium  for the next 10 days, given multiple abdominal surgeries she remains high risk for malabsorption -- Status post 2 g calcium  gluconate x 2 - Keep mag above 2 - Given CKD, would give serious consideration to Prolia in the future. Recommend close follow up with Rheum/Renal to discuss. - Ionized calcium , PTH, vitamin D , urine calcium : Still pending   Weakness - Likely secondary to hypocalcemia -- PT HH ordered.   Chronic diastolic CHF - 2D echo 2023: EF over 55%, grade 2 diastolic dysfunction.  Trace leg edema and BNP of 94 on arrival - Continue with home dose Lasix    Pleural effusion on left - Outpatient thoracentesis 8/19.  450 cc blood-tinged fluid drained - Follow studies, cultures currently negative. - Continue oxygen   Chronic hypoxia - On 2 L at home, occasionally on room air.  Has conserving device.   Apical pneumothorax - Sequelae of thoracentesis 8/19 - Repeat CXR shows stability   Hypertension - Continue current meds   Hypothyroidism - Continue Synthroid    Cirrhosis of liver without ascites - Stable.  Continue current meds   Type 2 diabetes with renal manifestations - Continue sliding scale insulin  - Basal/bolus, titrate as needed   Iron  deficiency anemia - Patient's baseline hemoglobin close to 8. - Hemoglobin on arrival 10.2. Suspect initial sample was dehydrated.  Patient reports recently water intake has decreased.  Encouraged oral hydration - Continue iron  supplement -Patient received EPO x  1 8/21 consistent with her weekly injections - Hemoglobin remains stable    CKD stage IIIb -Presently renal function is improved beyond baseline - Continue to monitor closely - Avoid nephrotoxic medications when able   UTI - Patient has been taking Levaquin since  8/13.  Received 3 g fosfomycin 8/19. - She has completed treatment.  Will discontinue antibiotics - Had QT prolongation on arrival.  Fluoroquinolone discontinued as above   Prolonged QT interval - Discontinue Levaquin as above - Also on BuSpar, Cymbalta, at home.  QTc is resolved.  Continue home antidepressants - Follow-up with EKG outpatient.  QTc today 472   Depression with anxiety - Continue Xanax    Class I obesity BMI 33 - Outpatient follow up for lifestyle modification and risk factor management   History of colon cancer History of pancreatic cancer History of breast cancer Renal cancer History of thyroid  cancer - Reports genetic testing has been negative - Follow-up with oncology as needed.  Discharge Instructions  Discharge Instructions     Call MD for:  difficulty breathing, headache or visual disturbances   Complete by: As directed    Call MD for:  persistant dizziness or light-headedness   Complete by: As directed    Call MD for:  persistant nausea and vomiting   Complete by: As directed    Call MD for:  severe uncontrolled pain   Complete by: As directed    Call MD for:  temperature >100.4   Complete by: As directed    Diet general   Complete by: As directed    Discharge instructions   Complete by: As directed    While admitted we made some changes to your blood pressure medications. Please review your discharge medications closely to ensure you are taking the correct meds at home. Please take your blood pressure once a day and keep a log. See your primary care doctor in one week to review this log and make further medication changes.  Follow-up with your primary care physician to recheck your calcium  and EKG within 1 week   Increase activity slowly   Complete by: As directed       Allergies as of 10/19/2023       Reactions   Statins    Cirrhosis, unable to tolerate        Medication List     PAUSE taking these medications    calcium  acetate  667 MG capsule Wait to take this until your doctor or other care provider tells you to start again. Commonly known as: PHOSLO  Take 1 capsule by mouth 2 (two) times daily.   verapamil 120 MG CR tablet Wait to take this until your doctor or other care provider tells you to start again. Commonly known as: CALAN-SR Take by mouth.       STOP taking these medications    levofloxacin 500 MG tablet Commonly known as: LEVAQUIN       TAKE these medications    Accu-Chek FastClix Lancets Misc USE 1 EACH FOUR TIMES DAILY. USE AS INSTRUCTED   acetaminophen  325 MG tablet Commonly known as: TYLENOL  Take 650 mg by mouth every morning.   ALPRAZolam  0.25 MG tablet Commonly known as: XANAX  Take 0.25 mg by mouth at bedtime.   ARANESP  IJ Inject 200 mcg as directed every 14 (fourteen) days.   B-D UF III MINI PEN NEEDLES 31G X 5 MM Misc Generic drug: Insulin  Pen Needle 4 (four) times daily. as directed   busPIRone 10 MG tablet  Commonly known as: BUSPAR Take 10 mg by mouth 2 (two) times daily.   calcium  carbonate 1250 (500 Ca) MG tablet Commonly known as: OS-CAL - dosed in mg of elemental calcium  Take 2 tablets (2,500 mg total) by mouth 3 (three) times daily with meals for 10 days.   cyanocobalamin  1000 MCG tablet Commonly known as: VITAMIN B12 Take 1,000 mcg by mouth daily. Reported on 07/07/2015   cyclobenzaprine 5 MG tablet Commonly known as: FLEXERIL Take 1 tablet by mouth 3 (three) times daily as needed.   dicyclomine  10 MG capsule Commonly known as: BENTYL  Take 10 mg by mouth 2 (two) times daily.   DULoxetine 30 MG capsule Commonly known as: CYMBALTA Take 30 mg by mouth daily.   empagliflozin  10 MG Tabs tablet Commonly known as: JARDIANCE  Take 1 tablet by mouth daily.   ferrous sulfate  325 (65 FE) MG tablet Take 1 tablet (325 mg total) by mouth daily.   fexofenadine 180 MG tablet Commonly known as: ALLEGRA Take 180 mg by mouth every morning.   fluticasone  50  MCG/ACT nasal spray Commonly known as: FLONASE  Place 1 spray into both nostrils daily as needed for allergies or rhinitis.   furosemide  40 MG tablet Commonly known as: LASIX  Take 20-40 mg by mouth daily.   HumaLOG  KwikPen 100 UNIT/ML KwikPen Generic drug: insulin  lispro Inject 0.08 mLs (8 Units total) into the skin 3 (three) times daily. Per sliding scale What changed: how much to take   HYDROcodone -acetaminophen  10-325 MG tablet Commonly known as: NORCO Take by mouth.   levothyroxine  200 MCG tablet Commonly known as: SYNTHROID  Take 200 mcg by mouth daily before breakfast.   losartan  25 MG tablet Commonly known as: COZAAR  Take 1 tablet by mouth at bedtime.   magnesium  oxide 400 MG tablet Commonly known as: MAG-OX Take 400 mg by mouth 2 (two) times daily.   mirtazapine 15 MG tablet Commonly known as: REMERON Take 15 mg by mouth at bedtime.   pantoprazole  40 MG tablet Commonly known as: PROTONIX  Take 40 mg by mouth 2 (two) times daily.   Tresiba  FlexTouch 200 UNIT/ML FlexTouch Pen Generic drug: insulin  degludec Inject 26 Units into the skin every evening. What changed:  how much to take when to take this   Vitamin D -1000 Max St 25 MCG (1000 UT) tablet Generic drug: Cholecalciferol  Take 1,000 Units by mouth daily.        Allergies  Allergen Reactions   Statins     Cirrhosis, unable to tolerate    Consultations:    Procedures/Studies: DG Chest Port 1 View Result Date: 10/17/2023 CLINICAL DATA:  Pneumothorax. EXAM: PORTABLE CHEST 1 VIEW COMPARISON:  10/16/2023 FINDINGS: Pleural line identified in the left apex on the previous study is no longer evident. Left base collapse/consolidation with moderate effusion is similar. The cardio pericardial silhouette is enlarged. Right upper lobe calcified granuloma evident as confirmed on CT of 10/18/2016. No acute bony abnormality. Telemetry leads overlie the chest. IMPRESSION: 1. No evidence for left-sided pneumothorax  on the current study. 2. Persistent left base collapse/consolidation with moderate effusion. Electronically Signed   By: Camellia Candle M.D.   On: 10/17/2023 06:13   DG Chest 1 View Result Date: 10/16/2023 CLINICAL DATA:  Left thoracentesis.  History of pneumothorax. EXAM: CHEST  1 VIEW COMPARISON:  10/15/2023 FINDINGS: Moderate left pleural effusion is similar to prior. Probable associated left base collapse/consolidation. Tiny left apical pneumothorax is similar. The cardio pericardial silhouette is enlarged. Right lung remains clear. IMPRESSION: Similar  appearance of tiny left apical pneumothorax and moderate left pleural effusion. Electronically Signed   By: Camellia Candle M.D.   On: 10/16/2023 16:06   DG Chest Port 1 View Result Date: 10/15/2023 CLINICAL DATA:  Status post LEFT thoracentesis EXAM: PORTABLE CHEST - 1 VIEW COMPARISON:  10/01/2022 FINDINGS: Cardiomediastinal silhouette and pulmonary vasculature are within normal limits. Very small LEFT apical pneumothorax is suspected. Moderate opacity remains at the LEFT lung base likely combination of residual effusion and atelectasis. IMPRESSION: Very small LEFT apical pneumothorax. Follow-up chest radiograph should be obtained in 1.5-2 hours to evaluate for progression. These results were discussed with Warren Dais at the time of interpretation on 10/15/2023 at 12:40 pm, who acknowledged these results. Electronically Signed   By: Aliene Lloyd M.D.   On: 10/15/2023 12:41      Discharge Exam: Vitals:   10/19/23 0419 10/19/23 0753  BP: (!) 137/56 (!) 131/52  Pulse: 89 90  Resp: 16 19  Temp: 98.1 F (36.7 C) 98 F (36.7 C)  SpO2: 97% 97%   Vitals:   10/18/23 1354 10/18/23 2100 10/19/23 0419 10/19/23 0753  BP: (!) 129/42 (!) 139/51 (!) 137/56 (!) 131/52  Pulse: 89 93 89 90  Resp: 18 16 16 19   Temp: 98.5 F (36.9 C) 98.8 F (37.1 C) 98.1 F (36.7 C) 98 F (36.7 C)  TempSrc: Oral  Oral   SpO2: 100% 98% 97% 97%  Weight:      Height:         Constitutional:  Normal appearance. Non toxic-appearing.  HENT: Head Normocephalic and atraumatic.  Mucous membranes are moist.  Eyes:  Extraocular intact. Conjunctivae normal.  Cardiovascular: Rate and Rhythm: Normal rate and regular rhythm.  Pulmonary: Non labored, symmetric rise of chest wall.  Skin: warm and dry. not jaundiced.  Neurological: No focal deficit present. alert. Oriented.  Psychiatric: Mood and Affect congruent.    The results of significant diagnostics from this hospitalization (including imaging, microbiology, ancillary and laboratory) are listed below for reference.     Microbiology: Recent Results (from the past 240 hours)  Acid Fast Smear (AFB)     Status: None   Collection Time: 10/15/23 11:46 AM   Specimen: I: Lung, Left; Pleural Fluid   J: PATH Cytology Pleural fluid  Result Value Ref Range Status   AFB Specimen Processing Concentration  Final   Acid Fast Smear Negative  Final    Comment: (NOTE) Performed At: [facility]  [address] [place], [place] [id number] Jennette Shorter MD Ey:[id number]    Source (AFB) CYTO PLEU  Final    Comment: Performed at [facility], [address] [place], [place] [address]  Body fluid culture w Gram Stain     Status: None   Collection Time: 10/15/23 11:46 AM   Specimen: J: PATH Cytology Pleural fluid   E: PATH Cytology Pleural fluid  Result Value Ref Range Status   Specimen Description   Final    FLUID Performed at [facility], [address], [place], [place] [address]    Special Requests   Final     CYTO PLEU Performed at [facility], [address] [place], [place] [address]    Gram Stain NO WBC SEEN NO ORGANISMS SEEN   Final   Culture   Final    NO GROWTH 3 DAYS Performed at [facility], [address] [place], [place] [address]    Report Status 10/19/2023 FINAL  Final  Labs: BNP (last 3 results) Recent Labs    10/16/23 1952  BNP 94.9    Basic Metabolic Panel: Recent Labs  Lab 10/16/23 1952 10/17/23 0331 10/18/23 0325 10/19/23 0557  NA 139 139 139 139  K 4.3 4.7 4.3 4.3  CL 108 109 107 105  CO2 20* 22 24 30   GLUCOSE 199* 221* 86 103*  BUN 25* 25* 25* 25*  CREATININE 1.64* 1.58* 1.28* 1.14*  CALCIUM  5.9* 6.0* 5.9* 6.6*  MG 2.0  --  1.9 2.5*  PHOS 3.4  --  3.4 3.9   Liver Function Tests: Recent Labs  Lab 10/16/23 1952 10/18/23 0325 10/19/23 0557  AST 38 23 32  ALT 17 12 14   ALKPHOS 126 115 127*  BILITOT 0.6 0.5 0.7  PROT 6.6 5.6* 5.8*  ALBUMIN  3.0* 2.5* 2.5*   No results for input(s): LIPASE, AMYLASE in the last 168 hours. Recent Labs  Lab 10/17/23 0016  AMMONIA 29   CBC: Recent Labs  Lab 10/16/23 1952 10/17/23 0331 10/18/23 0325 10/19/23 0557  WBC 10.3 7.3 7.0 7.0  NEUTROABS  --   --  5.7 5.6  HGB 10.2* 8.5* 8.6* 8.6*  HCT 34.8* 27.1* 28.3* 27.9*  MCV 94.8 92.8 92.8 90.6  PLT 212 164 177 181   Cardiac Enzymes: No results for input(s): CKTOTAL, CKMB, CKMBINDEX, TROPONINI in the last 168 hours. BNP: Invalid input(s): POCBNP CBG: Recent Labs  Lab 10/17/23 2203 10/18/23 1406 10/18/23 2134 10/19/23 0755 10/19/23 1153  GLUCAP 305* 205* 387* 81 187*   D-Dimer No results for input(s): DDIMER in the last 72 hours. Hgb A1c No results for input(s): HGBA1C in the last 72 hours. Lipid Profile No results for input(s): CHOL, HDL, LDLCALC, TRIG, CHOLHDL, LDLDIRECT in the last 72 hours. Thyroid  function studies No results for input(s): TSH, T4TOTAL, T3FREE, THYROIDAB in the last 72 hours.  Invalid input(s): FREET3 Anemia work up No results for input(s): VITAMINB12, FOLATE, FERRITIN, TIBC, IRON , RETICCTPCT in the last 72 hours. Urinalysis    Component Value Date/Time   COLORURINE YELLOW (A) 04/01/2019 1422   APPEARANCEUR HAZY (A) 04/01/2019 1422   LABSPEC 1.018 04/01/2019 1422   PHURINE 5.0 04/01/2019 1422   GLUCOSEU NEGATIVE  04/01/2019 1422   HGBUR NEGATIVE 04/01/2019 1422   BILIRUBINUR NEGATIVE 04/01/2019 1422   KETONESUR NEGATIVE 04/01/2019 1422   PROTEINUR NEGATIVE 04/01/2019 1422   NITRITE NEGATIVE 04/01/2019 1422   LEUKOCYTESUR TRACE (A) 04/01/2019 1422   Sepsis Labs Recent Labs  Lab 10/16/23 1952 10/17/23 0331 10/18/23 0325 10/19/23 0557  WBC 10.3 7.3 7.0 7.0   Microbiology Recent Results (from the past 240 hours)  Acid Fast Smear (AFB)     Status: None   Collection Time: 10/15/23 11:46 AM   Specimen: I: Lung, Left; Pleural Fluid   J: PATH Cytology Pleural fluid  Result Value Ref Range Status   AFB Specimen Processing Concentration  Final   Acid Fast Smear Negative  Final    Comment: (NOTE) Performed At: [facility] [place] [address] [place], [place] [id number] Jennette Shorter MD Ey:[id number]    Source (AFB) CYTO PLEU  Final    Comment: Performed at [facility], [address] [place], [place] [address]  Body fluid culture w Gram Stain     Status: None   Collection Time: 10/15/23 11:46 AM   Specimen: J: PATH Cytology Pleural fluid   E: PATH Cytology Pleural fluid  Result Value Ref Range Status   Specimen Description   Final  FLUID Performed at [facility], [address], [place], [place] [address]    Special Requests   Final     CYTO PLEU Performed at [facility], [address] [place], [place] [address]    Gram Stain NO WBC SEEN NO ORGANISMS SEEN   Final   Culture   Final    NO GROWTH 3 DAYS Performed at [facility], [address] [place], [place] [address]    Report Status 10/19/2023 FINAL  Final     Time coordinating discharge: 32 min   SIGNED: Vannie Hilgert, DO [facility] 10/19/2023, 1:13 PM Pager   If 7PM-7AM, please contact night-coverage

## 2023-10-19 NOTE — Plan of Care (Signed)

## 2023-10-20 LAB — GLUCOSE, CAPILLARY: Glucose-Capillary: 95 mg/dL (ref 70–99)

## 2023-10-21 LAB — PARATHYROID HORMONE, INTACT (NO CA): PTH: 47 pg/mL (ref 15–65)

## 2023-10-21 LAB — CYTOLOGY - NON PAP

## 2023-10-21 LAB — CALCITRIOL (1,25 DI-OH VIT D): Vit D, 1,25-Dihydroxy: 38.9 pg/mL (ref 24.8–81.5)

## 2023-10-22 LAB — TRIGLYCERIDES, BODY FLUIDS: Triglycerides, Fluid: 35 mg/dL

## 2023-10-24 LAB — CHOLESTEROL, BODY FLUID: Cholesterol, Fluid: 42 mg/dL

## 2023-10-29 LAB — MISC LABCORP TEST (SEND OUT): Labcorp test code: 9985

## 2023-10-30 ENCOUNTER — Other Ambulatory Visit: Payer: Self-pay | Admitting: Pulmonary Disease

## 2023-10-30 DIAGNOSIS — J90 Pleural effusion, not elsewhere classified: Secondary | ICD-10-CM

## 2023-11-14 ENCOUNTER — Encounter: Payer: Self-pay | Admitting: Radiology

## 2023-11-19 LAB — FUNGAL ORGANISM REFLEX

## 2023-11-19 LAB — FUNGUS CULTURE WITH STAIN

## 2023-11-19 LAB — FUNGUS CULTURE RESULT

## 2023-11-20 ENCOUNTER — Ambulatory Visit

## 2023-11-22 ENCOUNTER — Other Ambulatory Visit: Payer: Self-pay | Admitting: Internal Medicine

## 2023-11-22 DIAGNOSIS — R1012 Left upper quadrant pain: Secondary | ICD-10-CM

## 2023-11-26 ENCOUNTER — Other Ambulatory Visit: Payer: Self-pay | Admitting: Internal Medicine

## 2023-11-26 DIAGNOSIS — J90 Pleural effusion, not elsewhere classified: Secondary | ICD-10-CM

## 2023-11-27 ENCOUNTER — Ambulatory Visit
Admission: RE | Admit: 2023-11-27 | Discharge: 2023-11-27 | Disposition: A | Discharge status: Home / Self Care | Source: Ambulatory Visit | Attending: Internal Medicine | Admitting: Internal Medicine

## 2023-11-27 ENCOUNTER — Other Ambulatory Visit: Payer: Self-pay | Admitting: Urology

## 2023-11-27 ENCOUNTER — Ambulatory Visit
Admission: RE | Admit: 2023-11-27 | Discharge: 2023-11-27 | Disposition: A | Discharge status: Home / Self Care | Source: Ambulatory Visit | Attending: Urology | Admitting: Urology

## 2023-11-27 DIAGNOSIS — R161 Splenomegaly, not elsewhere classified: Secondary | ICD-10-CM | POA: Insufficient documentation

## 2023-11-27 DIAGNOSIS — J9811 Atelectasis: Secondary | ICD-10-CM | POA: Insufficient documentation

## 2023-11-27 DIAGNOSIS — Z9049 Acquired absence of other specified parts of digestive tract: Secondary | ICD-10-CM | POA: Diagnosis not present

## 2023-11-27 DIAGNOSIS — J90 Pleural effusion, not elsewhere classified: Secondary | ICD-10-CM | POA: Insufficient documentation

## 2023-11-27 DIAGNOSIS — Z9889 Other specified postprocedural states: Secondary | ICD-10-CM | POA: Insufficient documentation

## 2023-11-27 DIAGNOSIS — I10 Essential (primary) hypertension: Secondary | ICD-10-CM | POA: Insufficient documentation

## 2023-11-27 DIAGNOSIS — Z905 Acquired absence of kidney: Secondary | ICD-10-CM | POA: Insufficient documentation

## 2023-11-27 DIAGNOSIS — K7460 Unspecified cirrhosis of liver: Secondary | ICD-10-CM | POA: Diagnosis not present

## 2023-11-27 DIAGNOSIS — R1012 Left upper quadrant pain: Secondary | ICD-10-CM | POA: Diagnosis not present

## 2023-11-27 MED ORDER — LIDOCAINE HCL (PF) 1 % IJ SOLN
10.0000 mL | Freq: Once | INTRAMUSCULAR | Status: AC
Start: 2023-11-27 — End: 2023-11-27
  Administered 2023-11-27: 10 mL via INTRADERMAL
  Filled 2023-11-27: qty 10

## 2023-11-27 NOTE — Discharge Instructions (Signed)
 Reviewed with patient

## 2023-11-27 NOTE — Procedures (Addendum)
 PROCEDURE SUMMARY:  Unsuccessful image-guided left-sided therapeutic thoracentesis, despite 2 separate attempts, with the introduced needle visualized within the pleural fluid collection. Yielded minimal amount (approximately 5 mL) of blood-laden pleural fluid. Patient tolerated procedure well. EBL: Zero No immediate complications.  Post procedure CXR shows no pneumothorax.  Please see imaging section of Epic for full dictation.  [facility] Braylie Badami PA-C 11/27/2023 9:49 AM

## 2023-11-30 LAB — ACID FAST CULTURE WITH REFLEXED SENSITIVITIES (MYCOBACTERIA): Acid Fast Culture: NEGATIVE

## 2024-01-29 ENCOUNTER — Other Ambulatory Visit
Admission: RE | Admit: 2024-01-29 | Discharge: 2024-01-29 | Disposition: A | Source: Ambulatory Visit | Attending: Internal Medicine | Admitting: Internal Medicine

## 2024-01-29 ENCOUNTER — Other Ambulatory Visit: Payer: Self-pay | Admitting: Internal Medicine

## 2024-01-29 ENCOUNTER — Ambulatory Visit
Admission: RE | Admit: 2024-01-29 | Discharge: 2024-01-29 | Disposition: A | Source: Ambulatory Visit | Attending: Internal Medicine | Admitting: Internal Medicine

## 2024-01-29 DIAGNOSIS — R0789 Other chest pain: Secondary | ICD-10-CM

## 2024-01-29 DIAGNOSIS — J9611 Chronic respiratory failure with hypoxia: Secondary | ICD-10-CM | POA: Diagnosis present

## 2024-01-29 DIAGNOSIS — R0602 Shortness of breath: Secondary | ICD-10-CM | POA: Diagnosis present

## 2024-01-29 LAB — D-DIMER, QUANTITATIVE: D-Dimer, Quant: 2.38 ug{FEU}/mL — ABNORMAL HIGH (ref 0.00–0.50)

## 2024-01-29 MED ORDER — IOHEXOL 350 MG/ML SOLN
75.0000 mL | Freq: Once | INTRAVENOUS | Status: AC | PRN
  Administered 2024-01-29: 75 mL via INTRAVENOUS

## 2024-02-03 ENCOUNTER — Other Ambulatory Visit: Payer: Self-pay

## 2024-02-03 ENCOUNTER — Encounter: Payer: Self-pay | Admitting: Internal Medicine

## 2024-02-03 MED ORDER — SODIUM POLYSTYRENE SULFONATE 15 GM/60ML CO SUSP
15.0000 g | Freq: Every day | 0 refills | Status: DC
  Filled 2024-02-03: qty 180, 3d supply, fill #0
  Filled 2024-02-03: qty 45, 3d supply, fill #0

## 2024-02-06 ENCOUNTER — Emergency Department

## 2024-02-06 ENCOUNTER — Other Ambulatory Visit: Payer: Self-pay

## 2024-02-06 ENCOUNTER — Inpatient Hospital Stay
Admission: EM | Admit: 2024-02-06 | Discharge: 2024-02-09 | DRG: 308 | Disposition: A | Attending: Internal Medicine | Admitting: Internal Medicine

## 2024-02-06 DIAGNOSIS — N183 Chronic kidney disease, stage 3 unspecified: Secondary | ICD-10-CM

## 2024-02-06 DIAGNOSIS — I130 Hypertensive heart and chronic kidney disease with heart failure and stage 1 through stage 4 chronic kidney disease, or unspecified chronic kidney disease: Secondary | ICD-10-CM | POA: Diagnosis present

## 2024-02-06 DIAGNOSIS — Q2730 Arteriovenous malformation, site unspecified: Secondary | ICD-10-CM | POA: Diagnosis not present

## 2024-02-06 DIAGNOSIS — E039 Hypothyroidism, unspecified: Secondary | ICD-10-CM | POA: Diagnosis present

## 2024-02-06 DIAGNOSIS — F32A Depression, unspecified: Secondary | ICD-10-CM | POA: Diagnosis not present

## 2024-02-06 DIAGNOSIS — F419 Anxiety disorder, unspecified: Secondary | ICD-10-CM | POA: Diagnosis not present

## 2024-02-06 DIAGNOSIS — I4819 Other persistent atrial fibrillation: Secondary | ICD-10-CM | POA: Diagnosis present

## 2024-02-06 DIAGNOSIS — E1165 Type 2 diabetes mellitus with hyperglycemia: Secondary | ICD-10-CM | POA: Diagnosis present

## 2024-02-06 DIAGNOSIS — E1122 Type 2 diabetes mellitus with diabetic chronic kidney disease: Secondary | ICD-10-CM | POA: Diagnosis not present

## 2024-02-06 DIAGNOSIS — Z7189 Other specified counseling: Secondary | ICD-10-CM | POA: Diagnosis not present

## 2024-02-06 DIAGNOSIS — K7460 Unspecified cirrhosis of liver: Secondary | ICD-10-CM | POA: Diagnosis present

## 2024-02-06 DIAGNOSIS — I4891 Unspecified atrial fibrillation: Principal | ICD-10-CM | POA: Diagnosis present

## 2024-02-06 DIAGNOSIS — I5033 Acute on chronic diastolic (congestive) heart failure: Secondary | ICD-10-CM | POA: Diagnosis not present

## 2024-02-06 DIAGNOSIS — D500 Iron deficiency anemia secondary to blood loss (chronic): Secondary | ICD-10-CM | POA: Diagnosis present

## 2024-02-06 DIAGNOSIS — Z794 Long term (current) use of insulin: Secondary | ICD-10-CM | POA: Diagnosis not present

## 2024-02-06 DIAGNOSIS — I10 Essential (primary) hypertension: Secondary | ICD-10-CM | POA: Diagnosis present

## 2024-02-06 DIAGNOSIS — R739 Hyperglycemia, unspecified: Secondary | ICD-10-CM | POA: Diagnosis present

## 2024-02-06 DIAGNOSIS — I3139 Other pericardial effusion (noninflammatory): Secondary | ICD-10-CM | POA: Diagnosis present

## 2024-02-06 DIAGNOSIS — J90 Pleural effusion, not elsewhere classified: Secondary | ICD-10-CM | POA: Diagnosis not present

## 2024-02-06 DIAGNOSIS — E875 Hyperkalemia: Secondary | ICD-10-CM | POA: Diagnosis present

## 2024-02-06 DIAGNOSIS — R627 Adult failure to thrive: Secondary | ICD-10-CM | POA: Diagnosis present

## 2024-02-06 DIAGNOSIS — Z711 Person with feared health complaint in whom no diagnosis is made: Secondary | ICD-10-CM | POA: Diagnosis not present

## 2024-02-06 DIAGNOSIS — E669 Obesity, unspecified: Secondary | ICD-10-CM | POA: Diagnosis present

## 2024-02-06 DIAGNOSIS — Z515 Encounter for palliative care: Secondary | ICD-10-CM | POA: Diagnosis not present

## 2024-02-06 DIAGNOSIS — C7889 Secondary malignant neoplasm of other digestive organs: Secondary | ICD-10-CM | POA: Diagnosis present

## 2024-02-06 DIAGNOSIS — Z6835 Body mass index (BMI) 35.0-35.9, adult: Secondary | ICD-10-CM | POA: Diagnosis not present

## 2024-02-06 DIAGNOSIS — K766 Portal hypertension: Secondary | ICD-10-CM | POA: Diagnosis present

## 2024-02-06 DIAGNOSIS — Z7901 Long term (current) use of anticoagulants: Secondary | ICD-10-CM | POA: Diagnosis not present

## 2024-02-06 DIAGNOSIS — C792 Secondary malignant neoplasm of skin: Secondary | ICD-10-CM | POA: Diagnosis present

## 2024-02-06 DIAGNOSIS — C50912 Malignant neoplasm of unspecified site of left female breast: Secondary | ICD-10-CM | POA: Diagnosis present

## 2024-02-06 DIAGNOSIS — Z66 Do not resuscitate: Secondary | ICD-10-CM | POA: Diagnosis not present

## 2024-02-06 LAB — CBC WITH DIFFERENTIAL/PLATELET
Abs Immature Granulocytes: 0.06 K/uL (ref 0.00–0.07)
Basophils Absolute: 0 K/uL (ref 0.0–0.1)
Basophils Relative: 0 %
Eosinophils Absolute: 0 K/uL (ref 0.0–0.5)
Eosinophils Relative: 0 %
HCT: 29.2 % — ABNORMAL LOW (ref 36.0–46.0)
Hemoglobin: 9.3 g/dL — ABNORMAL LOW (ref 12.0–15.0)
Immature Granulocytes: 1 %
Lymphocytes Relative: 4 %
Lymphs Abs: 0.4 K/uL — ABNORMAL LOW (ref 0.7–4.0)
MCH: 27 pg (ref 26.0–34.0)
MCHC: 31.8 g/dL (ref 30.0–36.0)
MCV: 84.9 fL (ref 80.0–100.0)
Monocytes Absolute: 0.5 K/uL (ref 0.1–1.0)
Monocytes Relative: 5 %
Neutro Abs: 10.4 K/uL — ABNORMAL HIGH (ref 1.7–7.7)
Neutrophils Relative %: 90 %
Platelets: 226 K/uL (ref 150–400)
RBC: 3.44 MIL/uL — ABNORMAL LOW (ref 3.87–5.11)
RDW: 23.1 % — ABNORMAL HIGH (ref 11.5–15.5)
Smear Review: NORMAL
WBC: 11.4 K/uL — ABNORMAL HIGH (ref 4.0–10.5)
nRBC: 0 % (ref 0.0–0.2)

## 2024-02-06 LAB — URINALYSIS, ROUTINE W REFLEX MICROSCOPIC
Bacteria, UA: NONE SEEN
Bilirubin Urine: NEGATIVE
Glucose, UA: >=500 mg/dL — AB
Hgb urine dipstick: NEGATIVE
Ketones, ur: NEGATIVE mg/dL
Leukocytes,Ua: NEGATIVE
Nitrite: NEGATIVE
Protein, ur: NEGATIVE mg/dL
RBC / HPF: 0 RBC/hpf (ref 0–5)
Specific Gravity, Urine: 1.013 (ref 1.005–1.030)
pH: 5 (ref 5.0–8.0)

## 2024-02-06 LAB — COMPREHENSIVE METABOLIC PANEL WITH GFR
ALT: 31 U/L (ref 0–44)
AST: 60 U/L — ABNORMAL HIGH (ref 15–41)
Albumin: 3.3 g/dL — ABNORMAL LOW (ref 3.5–5.0)
Alkaline Phosphatase: 181 U/L — ABNORMAL HIGH (ref 38–126)
Anion gap: 15 (ref 5–15)
BUN: 54 mg/dL — ABNORMAL HIGH (ref 8–23)
CO2: 23 mmol/L (ref 22–32)
Calcium: 7.6 mg/dL — ABNORMAL LOW (ref 8.9–10.3)
Chloride: 96 mmol/L — ABNORMAL LOW (ref 98–111)
Creatinine, Ser: 1.22 mg/dL — ABNORMAL HIGH (ref 0.44–1.00)
GFR, Estimated: 45 mL/min — ABNORMAL LOW (ref >60)
Glucose, Bld: 442 mg/dL — ABNORMAL HIGH (ref 70–99)
Potassium: 5.6 mmol/L — ABNORMAL HIGH (ref 3.5–5.1)
Sodium: 134 mmol/L — ABNORMAL LOW (ref 135–145)
Total Bilirubin: 0.5 mg/dL (ref 0.0–1.2)
Total Protein: 6.9 g/dL (ref 6.5–8.1)

## 2024-02-06 LAB — CBG MONITORING, ED
Glucose-Capillary: 419 mg/dL — ABNORMAL HIGH (ref 70–99)
Glucose-Capillary: 379 mg/dL — ABNORMAL HIGH (ref 70–99)

## 2024-02-06 LAB — BLOOD GAS, VENOUS
Acid-Base Excess: 2 mmol/L (ref 0.0–2.0)
Bicarbonate: 26.5 mmol/L (ref 20.0–28.0)
O2 Saturation: 86 %
Patient temperature: 37
pCO2, Ven: 40 mmHg — ABNORMAL LOW (ref 44–60)
pH, Ven: 7.43 (ref 7.25–7.43)
pO2, Ven: 55 mmHg — ABNORMAL HIGH (ref 32–45)

## 2024-02-06 LAB — TROPONIN T, HIGH SENSITIVITY
Troponin T High Sensitivity: 38 ng/L — ABNORMAL HIGH (ref 0–19)
Troponin T High Sensitivity: 39 ng/L — ABNORMAL HIGH (ref 0–19)

## 2024-02-06 LAB — BETA-HYDROXYBUTYRIC ACID: Beta-Hydroxybutyric Acid: 0.46 mmol/L — ABNORMAL HIGH (ref 0.05–0.27)

## 2024-02-06 LAB — PRO BRAIN NATRIURETIC PEPTIDE: Pro Brain Natriuretic Peptide: 719 pg/mL — ABNORMAL HIGH (ref <300.0)

## 2024-02-06 MED ORDER — FUROSEMIDE 40 MG PO TABS: 20.0000 mg | ORAL_TABLET | Freq: Every day | ORAL | Status: DC

## 2024-02-06 MED ORDER — MIDODRINE HCL 5 MG PO TABS
2.5000 mg | ORAL_TABLET | Freq: Three times a day (TID) | ORAL | Status: DC
  Administered 2024-02-07 – 2024-02-08 (×5): 2.5 mg via ORAL
  Filled 2024-02-06 (×5): qty 1

## 2024-02-06 MED ORDER — MAGNESIUM HYDROXIDE 400 MG/5ML PO SUSP: 30.0000 mL | Freq: Every day | ORAL | Status: DC | PRN

## 2024-02-06 MED ORDER — FUROSEMIDE 10 MG/ML IJ SOLN
40.0000 mg | Freq: Two times a day (BID) | INTRAMUSCULAR | Status: DC
  Administered 2024-02-07 (×3): 40 mg via INTRAVENOUS
  Filled 2024-02-06 (×4): qty 4

## 2024-02-06 MED ORDER — LEVOTHYROXINE SODIUM 50 MCG PO TABS
150.0000 ug | ORAL_TABLET | Freq: Every day | ORAL | Status: DC
  Administered 2024-02-07 – 2024-02-08 (×2): 150 ug via ORAL
  Filled 2024-02-06: qty 3
  Filled 2024-02-06: qty 1

## 2024-02-06 MED ORDER — METOCLOPRAMIDE HCL 5 MG/ML IJ SOLN: 10.0000 mg | Freq: Four times a day (QID) | INTRAMUSCULAR | Status: DC | PRN

## 2024-02-06 MED ORDER — ALPRAZOLAM 0.25 MG PO TABS
0.2500 mg | ORAL_TABLET | Freq: Every day | ORAL | Status: DC
  Administered 2024-02-07 (×2): 0.25 mg via ORAL
  Filled 2024-02-06 (×2): qty 1

## 2024-02-06 MED ORDER — SODIUM CHLORIDE 0.9 % IV BOLUS
1000.0000 mL | Freq: Once | INTRAVENOUS | Status: AC
  Administered 2024-02-06: 1000 mL via INTRAVENOUS

## 2024-02-06 MED ORDER — MAGNESIUM OXIDE -MG SUPPLEMENT 400 (240 MG) MG PO TABS: 400.0000 mg | ORAL_TABLET | Freq: Two times a day (BID) | ORAL | Status: DC

## 2024-02-06 MED ORDER — DICYCLOMINE HCL 10 MG PO CAPS
10.0000 mg | ORAL_CAPSULE | Freq: Two times a day (BID) | ORAL | Status: DC
  Administered 2024-02-07 – 2024-02-08 (×4): 10 mg via ORAL
  Filled 2024-02-06 (×5): qty 1

## 2024-02-06 MED ORDER — LORAZEPAM 2 MG/ML IJ SOLN
0.5000 mg | Freq: Once | INTRAMUSCULAR | Status: AC | PRN
  Administered 2024-02-06: 0.5 mg via INTRAVENOUS
  Filled 2024-02-06: qty 1

## 2024-02-06 MED ORDER — BUSPIRONE HCL 10 MG PO TABS
10.0000 mg | ORAL_TABLET | Freq: Two times a day (BID) | ORAL | Status: DC
  Administered 2024-02-07 – 2024-02-08 (×4): 10 mg via ORAL
  Filled 2024-02-06: qty 2
  Filled 2024-02-06: qty 1
  Filled 2024-02-06 (×2): qty 2

## 2024-02-06 MED ORDER — TRAZODONE HCL 50 MG PO TABS: 25.0000 mg | ORAL_TABLET | Freq: Every evening | ORAL | Status: DC | PRN

## 2024-02-06 MED ORDER — LOSARTAN POTASSIUM 25 MG PO TABS
25.0000 mg | ORAL_TABLET | Freq: Every day | ORAL | Status: DC
  Administered 2024-02-07: 25 mg via ORAL
  Filled 2024-02-06: qty 1

## 2024-02-06 MED ORDER — LORATADINE 10 MG PO TABS
10.0000 mg | ORAL_TABLET | Freq: Every day | ORAL | Status: DC
  Administered 2024-02-07 – 2024-02-08 (×2): 10 mg via ORAL
  Filled 2024-02-06 (×2): qty 1

## 2024-02-06 MED ORDER — INSULIN GLARGINE 100 UNIT/ML ~~LOC~~ SOLN
50.0000 [IU] | Freq: Every day | SUBCUTANEOUS | Status: DC
  Administered 2024-02-07: 50 [IU] via SUBCUTANEOUS
  Filled 2024-02-06: qty 0.5

## 2024-02-06 MED ORDER — ACETAMINOPHEN 650 MG RE SUPP: 650.0000 mg | Freq: Four times a day (QID) | RECTAL | Status: DC | PRN

## 2024-02-06 MED ORDER — DILTIAZEM HCL-DEXTROSE 125-5 MG/125ML-% IV SOLN (PREMIX)
5.0000 mg/h | INTRAVENOUS | Status: DC
  Administered 2024-02-06: 5 mg/h via INTRAVENOUS
  Filled 2024-02-06: qty 125

## 2024-02-06 MED ORDER — CALCIUM CARBONATE 1250 (500 CA) MG PO TABS
2500.0000 mg | ORAL_TABLET | Freq: Three times a day (TID) | ORAL | Status: DC
  Administered 2024-02-07 – 2024-02-08 (×5): 2500 mg via ORAL
  Filled 2024-02-06 (×6): qty 2

## 2024-02-06 MED ORDER — SODIUM ZIRCONIUM CYCLOSILICATE 10 G PO PACK
10.0000 g | PACK | Freq: Once | ORAL | Status: AC
  Administered 2024-02-06: 10 g via ORAL
  Filled 2024-02-06: qty 1

## 2024-02-06 MED ORDER — VITAMIN D 25 MCG (1000 UNIT) PO TABS
1000.0000 [IU] | ORAL_TABLET | Freq: Every day | ORAL | Status: DC
  Administered 2024-02-07 – 2024-02-08 (×2): 1000 [IU] via ORAL
  Filled 2024-02-06 (×2): qty 1

## 2024-02-06 MED ORDER — FERROUS SULFATE 325 (65 FE) MG PO TABS
325.0000 mg | ORAL_TABLET | Freq: Every day | ORAL | Status: DC
  Administered 2024-02-07 – 2024-02-08 (×2): 325 mg via ORAL
  Filled 2024-02-06 (×2): qty 1

## 2024-02-06 MED ORDER — LETROZOLE 2.5 MG PO TABS
2.5000 mg | ORAL_TABLET | Freq: Every day | ORAL | Status: DC
  Administered 2024-02-07 – 2024-02-08 (×2): 2.5 mg via ORAL
  Filled 2024-02-06 (×2): qty 1

## 2024-02-06 MED ORDER — ACETAMINOPHEN 325 MG PO TABS: 650.0000 mg | ORAL_TABLET | Freq: Four times a day (QID) | ORAL | Status: DC | PRN

## 2024-02-06 MED ORDER — MAGNESIUM OXIDE 400 MG PO TABS
400.0000 mg | ORAL_TABLET | Freq: Two times a day (BID) | ORAL | Status: DC
  Administered 2024-02-07 – 2024-02-08 (×3): 400 mg via ORAL
  Filled 2024-02-06 (×7): qty 1

## 2024-02-06 MED ORDER — CALCITRIOL 0.25 MCG PO CAPS
0.2500 ug | ORAL_CAPSULE | Freq: Every day | ORAL | Status: DC
  Administered 2024-02-07 – 2024-02-08 (×2): 0.25 ug via ORAL
  Filled 2024-02-06 (×2): qty 1

## 2024-02-06 MED ORDER — ENOXAPARIN SODIUM 40 MG/0.4ML IJ SOSY: 40.0000 mg | PREFILLED_SYRINGE | Freq: Every day | INTRAMUSCULAR | Status: DC

## 2024-02-06 MED ORDER — DILTIAZEM LOAD VIA INFUSION
10.0000 mg | Freq: Once | INTRAVENOUS | Status: AC
  Administered 2024-02-06: 10 mg via INTRAVENOUS
  Filled 2024-02-06: qty 10

## 2024-02-06 MED ORDER — EMPAGLIFLOZIN 10 MG PO TABS
10.0000 mg | ORAL_TABLET | Freq: Every day | ORAL | Status: DC
  Administered 2024-02-07: 10 mg via ORAL
  Filled 2024-02-06: qty 1

## 2024-02-06 MED ORDER — VITAMIN B-12 1000 MCG PO TABS
1000.0000 ug | ORAL_TABLET | Freq: Every day | ORAL | Status: DC
  Administered 2024-02-07 – 2024-02-08 (×2): 1000 ug via ORAL
  Filled 2024-02-06: qty 2
  Filled 2024-02-06: qty 1

## 2024-02-06 NOTE — H&P (Incomplete)
    PATIENT NAME: Rebecca Kirby    MR#:  [id number]  DATE OF BIRTH:  1946/01/19  DATE OF ADMISSION:  02/06/2024  PRIMARY CARE PHYSICIAN: Auston Reyes BIRCH, MD   Patient is coming from: Home  REQUESTING/REFERRING PHYSICIAN: Jossie Birmingham, MD  CHIEF COMPLAINT:   Chief Complaint  Patient presents with   Hyperglycemia   Irregular Heart Beat    HISTORY OF PRESENT ILLNESS:  Rebecca Kirby is a 78 y.o. female with medical history significant for essential hypertension, hypothyroidism, anxiety, type 2 diabetes mellitus ED Course: *** EKG as reviewed by me : *** Imaging: *** PAST MEDICAL HISTORY:   Past Medical History:  Diagnosis Date   Anemia    Anxiety    Colon cancer (HCC)    Diabetes mellitus without complication (HCC)    Family history of adverse reaction to anesthesia    daughter had reaction to anesthesia and was in icu   GERD (gastroesophageal reflux disease)    Hypertension    Hypothyroidism    Kidney cancer, primary, with metastasis from kidney to other site [facility]) 08/26/2014   right   Pancreas cancer (HCC) 2018   Thyroid  cancer (HCC)     PAST SURGICAL HISTORY:   Past Surgical History:  Procedure Laterality Date   CHOLECYSTECTOMY     COLON RESECTION     COLONOSCOPY WITH PROPOFOL  N/A 09/01/2015   Procedure: COLONOSCOPY WITH PROPOFOL ;  Surgeon: Lamar ONEIDA Holmes, MD;  Location: [facility] ENDOSCOPY;  Service: Endoscopy;  Laterality: N/A;   CYSTOSCOPY  12/28/2016   Procedure: CYSTOSCOPY;  Surgeon: Ward, Mitzie BROCKS, MD;  Location: [facility] ORS;  Service: Gynecology;;   ESOPHAGOGASTRODUODENOSCOPY (EGD) WITH PROPOFOL  N/A 09/01/2015   Procedure: ESOPHAGOGASTRODUODENOSCOPY (EGD) WITH PROPOFOL ;  Surgeon: Lamar ONEIDA Holmes, MD;  Location: [facility] ENDOSCOPY;  Service: Endoscopy;  Laterality: N/A;   EUS N/A 06/28/2016   Procedure: FULL UPPER ENDOSCOPIC ULTRASOUND (EUS) RADIAL;  Surgeon: Deward Napoleon, MD;  Location: [facility] ENDOSCOPY;  Service: Endoscopy;   Laterality: N/A;   EXCISION MASS NECK N/A 09/20/2015   Procedure: EXCISION MASS NECK;  Surgeon: Chinita Hasten, MD;  Location: [facility];  Service: ENT;  Laterality: N/A;   LAPAROSCOPIC BILATERAL SALPINGO OOPHERECTOMY Bilateral 12/28/2016   Procedure: LAPAROSCOPIC BILATERAL SALPINGO OOPHORECTOMY;  Surgeon: Ward, Mitzie BROCKS, MD;  Location: [facility] ORS;  Service: Gynecology;  Laterality: Bilateral;   LAPAROSCOPIC HYSTERECTOMY N/A 12/28/2016   Procedure: HYSTERECTOMY TOTAL LAPAROSCOPIC;  Surgeon: Ward, Mitzie BROCKS, MD;  Location: [facility] ORS;  Service: Gynecology;  Laterality: N/A;   LAPAROSCOPIC LYSIS OF ADHESIONS  12/28/2016   Procedure: LAPAROSCOPIC EXTENSIVE LYSIS OF ADHESIONS;  Surgeon: Ward, Mitzie BROCKS, MD;  Location: [facility] ORS;  Service: Gynecology;;   rt kidney removed Right    THYROGLOSSAL DUCT CYST Right 09/20/2015   Procedure: excision sub mandibular gland right;  Surgeon: Chinita Hasten, MD;  Location: [facility] SURGERY [facility];  Service: ENT;  Laterality: Right;   THYROID  SURGERY Bilateral    TOTAL KNEE ARTHROPLASTY Right    TOTAL KNEE ARTHROPLASTY Left 08/09/2014   Procedure: TOTAL KNEE ARTHROPLASTY;  Surgeon: Lynwood SHAUNNA Hue, MD;  Location: [facility] ORS;  Service: Orthopedics;  Laterality: Left;    SOCIAL HISTORY:   Social History   Tobacco Use   Smoking status: Former    Current packs/day: 0.00    Types: Cigarettes    Quit date: 07/29/1994    Years since quitting: 29.5   Smokeless tobacco: Never  Substance Use Topics   Alcohol use: No  FAMILY HISTORY:   Family History  Problem Relation Age of Onset   Breast cancer Maternal Aunt    Breast cancer Cousin     DRUG ALLERGIES:  Allergies[1]  REVIEW OF SYSTEMS:   ROS As per history of present illness. All pertinent systems were reviewed above. Constitutional, HEENT, cardiovascular, respiratory, GI, GU, musculoskeletal, neuro, psychiatric, endocrine, integumentary and hematologic systems were reviewed and are  otherwise negative/unremarkable except for positive findings mentioned above in the HPI.   MEDICATIONS AT HOME:   Prior to Admission medications  Medication Sig Start Date End Date Taking? Authorizing Provider  acetaminophen  (TYLENOL ) 325 MG tablet Take 650 mg by mouth every morning.   Yes [provider]  alendronate (FOSAMAX) 70 MG tablet Take 70 mg by mouth once a week. 02/03/24  Yes [provider]  ALPRAZolam  (XANAX ) 0.25 MG tablet Take 0.25 mg by mouth at bedtime. 10/04/22  Yes [provider]  busPIRone (BUSPAR) 10 MG tablet Take 10 mg by mouth 2 (two) times daily. 10/09/23 10/08/24 Yes [provider]  calcitRIOL  (ROCALTROL ) 0.25 MCG capsule Take 0.25 mcg by mouth. 11/23/23  Yes [provider]  calcium  carbonate (OS-CAL - DOSED IN MG OF ELEMENTAL CALCIUM ) 1250 (500 Ca) MG tablet Take 2 tablets (2,500 mg total) by mouth 3 (three) times daily with meals for 10 days. 10/19/23 02/06/24 Yes Dezii, Alexandra, DO  Cholecalciferol  (VITAMIN D -1000 MAX ST) 25 MCG (1000 UT) tablet Take 1,000 Units by mouth daily.   Yes [provider]  Darbepoetin Alfa -Albumin  (ARANESP  IJ) Inject 200 mcg as directed every 14 (fourteen) days.   Yes [provider]  dicyclomine  (BENTYL ) 10 MG capsule Take 10 mg by mouth 2 (two) times daily. 10/09/23  Yes [provider]  empagliflozin  (JARDIANCE ) 10 MG TABS tablet Take 1 tablet by mouth daily. 08/29/22  Yes [provider]  ferrous sulfate  325 (65 FE) MG tablet Take 1 tablet (325 mg total) by mouth daily. 04/07/19 02/06/24 Yes Wieting, Richard, MD  fexofenadine (ALLEGRA) 180 MG tablet Take 180 mg by mouth every morning. 04/24/19  Yes [provider]  fluticasone  (FLONASE ) 50 MCG/ACT nasal spray Place 1 spray into both nostrils daily as needed for allergies or rhinitis.   Yes [provider]  furosemide  (LASIX ) 40 MG tablet Take 20-40 mg by mouth daily. 10/04/22  Yes [provider]  HUMALOG  KWIKPEN 100 UNIT/ML KwikPen Inject 0.08 mLs (8 Units total) into the skin 3 (three) times daily. Per sliding scale Patient taking differently: Inject 1-8 Units into the skin 3 (three) times daily. Per sliding scale 04/07/19  Yes Wieting, Richard, MD  letrozole (FEMARA) 2.5 MG tablet Take 2.5 mg by mouth daily. 01/28/24 01/27/25 Yes [provider]  levothyroxine  (SYNTHROID ) 150 MCG tablet Take 150 mcg by mouth daily.   Yes [provider]  magnesium  oxide (MAG-OX) 400 (240 Mg) MG tablet Take 1 tablet by mouth 2 (two) times daily. 01/09/24  Yes [provider]  magnesium  oxide (MAG-OX) 400 MG tablet Take 400 mg by mouth 2 (two) times daily. 10/08/22  Yes [provider]  midodrine (PROAMATINE) 2.5 MG tablet Take 2.5 mg by mouth 3 (three) times daily with meals. 01/20/24 01/19/25 Yes [provider]  predniSONE (DELTASONE) 20 MG tablet Take 20 mg by mouth daily with breakfast. 01/29/24 02/08/24 Yes [provider]  sertraline (ZOLOFT) 25 MG tablet Take 25 mg by mouth at bedtime. 01/29/24 01/28/25 Yes [provider]  TRESIBA  FLEXTOUCH 200 UNIT/ML  SOPN Inject 26 Units into the skin every evening. Patient taking differently: Inject 60 Units into the skin at bedtime. 04/07/19  Yes Wieting, Richard, MD  vitamin B-12 (CYANOCOBALAMIN ) 1000 MCG tablet Take 1,000 mcg by mouth daily. Reported on 07/07/2015   Yes [provider]  Accu-Chek FastClix Lancets MISC USE 1 EACH FOUR TIMES DAILY. USE AS INSTRUCTED 11/08/22   [provider]  B-D UF III MINI PEN NEEDLES 31G X 5 MM MISC 4 (four) times daily. as directed 10/31/22   [provider]  [Paused] calcium  acetate (PHOSLO ) 667 MG capsule Take 1 capsule by mouth 2 (two) times daily. Wait to take this until your doctor or other care provider tells you to start again. 01/01/23   [provider]  cyclobenzaprine (FLEXERIL) 5 MG tablet Take 1 tablet by mouth 3  (three) times daily as needed. Patient not taking: Reported on 02/06/2024 08/02/23   [provider]  DULoxetine (CYMBALTA) 30 MG capsule Take 30 mg by mouth daily. Patient not taking: Reported on 02/06/2024 10/09/23 10/08/24  [provider]  HYDROcodone -acetaminophen  (NORCO) 10-325 MG tablet Take by mouth. Patient not taking: Reported on 02/06/2024 09/17/22   [provider]  levothyroxine  (SYNTHROID ) 200 MCG tablet Take 200 mcg by mouth daily before breakfast. Patient not taking: Reported on 02/06/2024 06/28/23 06/27/24  [provider]  losartan  (COZAAR ) 25 MG tablet Take 1 tablet by mouth at bedtime. 11/15/22 11/15/23  [provider]  mirtazapine (REMERON) 15 MG tablet Take 15 mg by mouth at bedtime. Patient not taking: Reported on 02/06/2024 10/09/23 10/08/24  [provider]  pantoprazole  (PROTONIX ) 40 MG tablet Take 40 mg by mouth 2 (two) times daily. Patient not taking: Reported on 02/06/2024    [provider]  sodium polystyrene (SPS, SODIUM POLYSTYRENE SULF,) 15 GM/60ML suspension Take 60 mLs (15 g total) by mouth daily for 3 days. 02/03/24 02/06/24    [Paused] verapamil (CALAN-SR) 120 MG CR tablet Take by mouth. Wait to take this until your doctor or other care provider tells you to start again. 11/27/22 11/27/23  [provider]      VITAL SIGNS:  Blood pressure (!) 104/54, pulse (!) 112, temperature 97.9 F (36.6 C), temperature source Oral, resp. rate 19, height 5' 3 (1.6 m), weight 88.2 kg, SpO2 94%.  PHYSICAL EXAMINATION:  Physical Exam  GENERAL:  78 y.o.-year-old patient lying in the bed with no acute distress.  EYES: Pupils equal, round, reactive to light and accommodation. No scleral icterus. Extraocular muscles intact.  HEENT: Head atraumatic, normocephalic. Oropharynx and nasopharynx clear.  NECK:  Supple, no jugular venous distention. No thyroid  enlargement, no tenderness.  LUNGS: Normal breath sounds  bilaterally, no wheezing, rales,rhonchi or crepitation. No use of accessory muscles of respiration.  CARDIOVASCULAR: Regular rate and rhythm, S1, S2 normal. No murmurs, rubs, or gallops.  ABDOMEN: Soft, nondistended, nontender. Bowel sounds present. No organomegaly or mass.  EXTREMITIES: No pedal edema, cyanosis, or clubbing.  NEUROLOGIC: Cranial nerves II through XII are intact. Muscle strength 5/5 in all extremities. Sensation intact. Gait not checked.  PSYCHIATRIC: The patient is alert and oriented x 3.  Normal affect and good eye contact. SKIN: No obvious rash, lesion, or ulcer.   LABORATORY PANEL:   CBC Recent Labs  Lab 02/06/24 1821  WBC 11.4*  HGB 9.3*  HCT 29.2*  PLT 226   ------------------------------------------------------------------------------------------------------------------  Chemistries  Recent Labs  Lab 02/06/24 1821  NA 134*  K 5.6*  CL 96*  CO2 23  GLUCOSE 442*  BUN 54*  CREATININE 1.22*  CALCIUM  7.6*  AST 60*  ALT 31  ALKPHOS 181*  BILITOT 0.5   ------------------------------------------------------------------------------------------------------------------  Cardiac Enzymes No results for input(s): TROPONINI in the last 168 hours. ------------------------------------------------------------------------------------------------------------------  RADIOLOGY:  DG Chest Port 1 View Result Date: 02/06/2024 CLINICAL DATA:  Chest pain short of breath EXAM: PORTABLE CHEST 1 VIEW COMPARISON:  11/27/2023, chest CT 01/29/2024 FINDINGS: Cardiomegaly with central vascular congestion and mild diffuse interstitial opacity likely underlying edema. Moderate left effusion and persistent dense airspace disease at the left base. Aortic atherosclerosis. IMPRESSION: Cardiomegaly with central vascular congestion and mild diffuse interstitial opacity likely underlying edema. Cardiac enlargement may be in part due to pericardial effusion which was noted on recent CT  imaging. Moderate left effusion with persistent dense airspace disease at the left base. Electronically Signed   By: Luke Bun M.D.   On: 02/06/2024 18:56      IMPRESSION AND PLAN:  Assessment and Plan: No notes have been filed under this hospital service. Service: Hospitalist      DVT prophylaxis: Lovenox ***  Advanced Care Planning:  Code Status: full code***  Family Communication:  The plan of care was discussed in details with the patient (and family). I answered all questions. The patient agreed to proceed with the above mentioned plan. Further management will depend upon hospital course. Disposition Plan: Back to previous home environment Consults called: none***  All the records are reviewed and case discussed with ED provider.  Status is: Inpatient {Inpatient:23812}   At the time of the admission, it appears that the appropriate admission status for this patient is inpatient.  This is judged to be reasonable and necessary in order to provide the required intensity of service to ensure the patient's safety given the presenting symptoms, physical exam findings and initial radiographic and laboratory data in the context of comorbid conditions.  The patient requires inpatient status due to high intensity of service, high risk of further deterioration and high frequency of surveillance required.  I certify that at the time of admission, it is my clinical judgment that the patient will require inpatient hospital care extending more than 2 midnights.                            Dispo: The patient is from: Home              Anticipated d/c is to: Home              Patient currently is not medically stable to d/c.              Difficult to place patient: No  Madison DELENA Peaches M.D on 02/06/2024 at 11:06 PM  [facility]   From 7 PM-7 AM, contact night-coverage [email or web address]  CC: Primary care physician; Auston Reyes BIRCH, MD       [1] Allergies Allergen Reactions    Statins     Cirrhosis, unable to tolerate

## 2024-02-06 NOTE — ED Provider Notes (Signed)
 [facility] Provider Note   Event Date/Time   First MD Initiated Contact with Patient 02/06/24 1823     (approximate) History  Hyperglycemia and Irregular Heart Beat  HPI Rebecca Kirby is a 78 y.o. female with a stated past medical history of chronic atrial fibrillation, hypertension, renal cell carcinoma, CHF, cirrhosis, diabetes, colon cancer, thyroid  cancer, and pancreatic cancer who presents complaining of worsening A-fib and hyperglycemia.  Patient is also complaining of worsening shortness of breath with an increase in her chronic 2 L nasal cannula to 3 L nasal cannula by EMS. ROS: Patient currently denies any vision changes, tinnitus, difficulty speaking, facial droop, sore throat, chest pain, abdominal pain, nausea/vomiting/diarrhea, dysuria, or weakness/numbness/paresthesias in any extremity   Physical Exam  Triage Vital Signs: ED Triage Vitals  Encounter Vitals Group     BP 02/06/24 1814 (!) 137/113     Girls Systolic BP Percentile --      Girls Diastolic BP Percentile --      Boys Systolic BP Percentile --      Boys Diastolic BP Percentile --      Pulse Rate 02/06/24 1814 (!) 146     Resp 02/06/24 1814 (!) 26     Temp 02/06/24 1814 97.9 F (36.6 C)     Temp Source 02/06/24 1814 Oral     SpO2 02/06/24 1814 97 %     Weight 02/06/24 1813 194 lb 8 oz (88.2 kg)     Height 02/06/24 1813 5' 3 (1.6 m)     Head Circumference --      Peak Flow --      Pain Score 02/06/24 1811 0     Pain Loc --      Pain Education --      Exclude from Growth Chart --    Most recent vital signs: Vitals:   02/06/24 2300 02/06/24 2320  BP: (!) 104/54 (!) 109/51  Pulse: (!) 112 (!) 123  Resp: 19 17  Temp:    SpO2: 94% 94%   General: Awake, oriented x4. CV:  Good peripheral perfusion. Resp:  Increased effort.  Decreased breath sounds over bilateral lung bases Abd:  No distention. Other:  Elderly obese Caucasian female resting comfortably in no acute  distress ED Results / Procedures / Treatments  Labs (all labs ordered are listed, but only abnormal results are displayed) Labs Reviewed  COMPREHENSIVE METABOLIC PANEL WITH GFR - Abnormal; Notable for the following components:      Result Value   Sodium 134 (*)    Potassium 5.6 (*)    Chloride 96 (*)    Glucose, Bld 442 (*)    BUN 54 (*)    Creatinine, Ser 1.22 (*)    Calcium  7.6 (*)    Albumin  3.3 (*)    AST 60 (*)    Alkaline Phosphatase 181 (*)    GFR, Estimated 45 (*)    All other components within normal limits  CBC WITH DIFFERENTIAL/PLATELET - Abnormal; Notable for the following components:   WBC 11.4 (*)    RBC 3.44 (*)    Hemoglobin 9.3 (*)    HCT 29.2 (*)    RDW 23.1 (*)    Neutro Abs 10.4 (*)    Lymphs Abs 0.4 (*)    All other components within normal limits  BLOOD GAS, VENOUS - Abnormal; Notable for the following components:   pCO2, Ven 40 (*)    pO2, Ven 55 (*)  All other components within normal limits  BETA-HYDROXYBUTYRIC ACID - Abnormal; Notable for the following components:   Beta-Hydroxybutyric Acid 0.46 (*)    All other components within normal limits  URINALYSIS, ROUTINE W REFLEX MICROSCOPIC - Abnormal; Notable for the following components:   Color, Urine YELLOW (*)    APPearance CLEAR (*)    Glucose, UA >=500 (*)    All other components within normal limits  PRO BRAIN NATRIURETIC PEPTIDE - Abnormal; Notable for the following components:   Pro Brain Natriuretic Peptide 719.0 (*)    All other components within normal limits  CBG MONITORING, ED - Abnormal; Notable for the following components:   Glucose-Capillary 419 (*)    All other components within normal limits  CBG MONITORING, ED - Abnormal; Notable for the following components:   Glucose-Capillary 379 (*)    All other components within normal limits  TROPONIN T, HIGH SENSITIVITY - Abnormal; Notable for the following components:   Troponin T High Sensitivity 38 (*)    All other components  within normal limits  TROPONIN T, HIGH SENSITIVITY - Abnormal; Notable for the following components:   Troponin T High Sensitivity 39 (*)    All other components within normal limits  BASIC METABOLIC PANEL WITH GFR  CBC   EKG ED ECG REPORT I, Artist MARLA Kerns, the attending physician, personally viewed and interpreted this ECG. Date: 02/06/2024 EKG Time: 1816 Rate: 144 Rhythm: Atrial fibrillation with rapid ventricular response QRS Axis: normal Intervals: normal ST/T Wave abnormalities: normal Narrative Interpretation: Atrial fibrillation with rapid ventricular response.  No evidence of acute ischemia RADIOLOGY ED MD interpretation: Single view portable chest x-ray shows cardiomegaly with central vascular congestion and mild diffuse interstitial opacity likely underlying edema.  There is cardiac enlargement may be in part due to pericardial effusion which was noted on recent CT.  There is moderate left pleural effusion and persistent dense airspace disease in the left base - All radiology independently interpreted and agree with radiology assessment Official radiology report(s): DG Chest Port 1 View Result Date: 02/06/2024 CLINICAL DATA:  Chest pain short of breath EXAM: PORTABLE CHEST 1 VIEW COMPARISON:  11/27/2023, chest CT 01/29/2024 FINDINGS: Cardiomegaly with central vascular congestion and mild diffuse interstitial opacity likely underlying edema. Moderate left effusion and persistent dense airspace disease at the left base. Aortic atherosclerosis. IMPRESSION: Cardiomegaly with central vascular congestion and mild diffuse interstitial opacity likely underlying edema. Cardiac enlargement may be in part due to pericardial effusion which was noted on recent CT imaging. Moderate left effusion with persistent dense airspace disease at the left base. Electronically Signed   By: Luke Bun M.D.   On: 02/06/2024 18:56   PROCEDURES: Critical Care performed: Yes, see critical care procedure  note(s) Procedures CRITICAL CARE Performed by: Shenise Wolgamott K Leliana Kontz  Total critical care time: 35 minutes  Critical care time was exclusive of separately billable procedures and treating other patients.  Critical care was necessary to treat or prevent imminent or life-threatening deterioration.  Critical care was time spent personally by me on the following activities: development of treatment plan with patient and/or surrogate as well as nursing, discussions with consultants, evaluation of patient's response to treatment, examination of patient, obtaining history from patient or surrogate, ordering and performing treatments and interventions, ordering and review of laboratory studies, ordering and review of radiographic studies, pulse oximetry and re-evaluation of patient's condition.  MEDICATIONS ORDERED IN ED: Medications  diltiazem (CARDIZEM) 125 mg in dextrose  5% 125 mL (1 mg/mL) infusion (5  mg/hr Intravenous Rate/Dose Change 02/06/24 2057)  letrozole (FEMARA) tablet 2.5 mg (has no administration in time range)  losartan  (COZAAR ) tablet 25 mg (has no administration in time range)  midodrine (PROAMATINE) tablet 2.5 mg (has no administration in time range)  ALPRAZolam  (XANAX ) tablet 0.25 mg (has no administration in time range)  busPIRone (BUSPAR) tablet 10 mg (has no administration in time range)  calcitRIOL  (ROCALTROL ) capsule 0.25 mcg (has no administration in time range)  empagliflozin  (JARDIANCE ) tablet 10 mg (has no administration in time range)  levothyroxine  (SYNTHROID ) tablet 150 mcg (has no administration in time range)  insulin  degludec (TRESIBA ) 200 UNIT/ML FlexTouch Pen SOPN 60 Units (has no administration in time range)  dicyclomine  (BENTYL ) capsule 10 mg (has no administration in time range)  magnesium  oxide (MAG-OX) tablet 400 mg (has no administration in time range)  ferrous sulfate  tablet 325 mg (has no administration in time range)  cyanocobalamin  (VITAMIN B12) tablet  1,000 mcg (has no administration in time range)  calcium  carbonate (OS-CAL - dosed in mg of elemental calcium ) tablet 2,500 mg (has no administration in time range)  magnesium  oxide (MAG-OX) tablet 400 mg (has no administration in time range)  Cholecalciferol  1,000 Units (has no administration in time range)  loratadine  (CLARITIN ) tablet 10 mg (has no administration in time range)  furosemide  (LASIX ) injection 40 mg (has no administration in time range)  enoxaparin  (LOVENOX ) injection 40 mg (has no administration in time range)  acetaminophen  (TYLENOL ) tablet 650 mg (has no administration in time range)    Or  acetaminophen  (TYLENOL ) suppository 650 mg (has no administration in time range)  traZODone (DESYREL) tablet 25 mg (has no administration in time range)  magnesium  hydroxide (MILK OF MAGNESIA) suspension 30 mL (has no administration in time range)  metoCLOPramide  (REGLAN ) injection 10 mg (has no administration in time range)  LORazepam (ATIVAN) injection 0.5 mg (0.5 mg Intravenous Given 02/06/24 1843)  sodium chloride  0.9 % bolus 1,000 mL (0 mLs Intravenous Stopped 02/06/24 2031)  diltiazem (CARDIZEM) 1 mg/mL load via infusion 10 mg (10 mg Intravenous Bolus from Bag 02/06/24 1946)  sodium zirconium cyclosilicate  (LOKELMA ) packet 10 g (10 g Oral Given 02/06/24 2237)   IMPRESSION / MDM / ASSESSMENT AND PLAN / ED COURSE  I reviewed the triage vital signs and the nursing notes.                             The patient is on the cardiac monitor to evaluate for evidence of arrhythmia and/or significant heart rate changes. Patient's presentation is most consistent with acute presentation with potential threat to life or bodily function. Patient is a 78 year old female with the above-stated past medical history who presents complaining of worsening A-fib and hyperglycemia as well as worsening shortness of breath DDx: A-fib with RVR, CHF exacerbation, pulmonary edema, upper respiratory  infection Plan: CBC, CMP, UA, VBG, BNP, troponin, beta hydroxybutyrate, chest x-ray, and EKG  Radiologic and laboratory evaluation significant for persistent atrial fibrillation with RVR requiring a diltiazem drip.  Patient also shows hyperglycemia but no evidence of DKA. Given the need for this drip, patient will require admission to the internal medicine service for further evaluation and management.  Dispo: Admit to medicine   FINAL CLINICAL IMPRESSION(S) / ED DIAGNOSES   Final diagnoses:  Atrial fibrillation with rapid ventricular response (HCC)   Rx / DC Orders   ED Discharge Orders     None  Note:  This document was prepared using Dragon voice recognition software and may include unintentional dictation errors.   Lina Hitch K, MD 02/06/24 [phone]

## 2024-02-06 NOTE — ED Triage Notes (Signed)
 Pt to ED via [facility] from home for c/o uncontrolled Afib and hyperglycemia. Per EMS, HR was up to 165 and glucometer read high. Pt states she took her morning insulin  and states glucose was 142. Pt also c/o SOB, wears 2L at baseline, placed on 3L by EMS. Given 500 mL NS.  Pt has hx cancer, Afib.

## 2024-02-06 NOTE — H&P (Signed)
 [place]   PATIENT NAME: Rebecca Kirby    MR#:  [id number]  DATE OF BIRTH:  1945-03-26  DATE OF ADMISSION:  02/06/2024  PRIMARY CARE PHYSICIAN: Auston Reyes BIRCH, MD   Patient is coming from: Home  REQUESTING/REFERRING PHYSICIAN: Jossie Birmingham, MD  CHIEF COMPLAINT:   Chief Complaint  Patient presents with   Hyperglycemia   Irregular Heart Beat    HISTORY OF PRESENT ILLNESS:  Rebecca Kirby is a 78 y.o. Caucasian female with medical history significant for essential hypertension, hypothyroidism, anxiety, grade 1 diastolic dysfunction, history of renal cancer status post right nephrectomy, history of colon cancer status post resection, metastatic breast cancer, and type 2 diabetes mellitus, who presented to the emergency room with onset of worsening dyspnea that started since Tuesday with associated palpitations, after leaving the oncology center where she has been following for stage IV metastatic breast cancer.  She has been fairly anxious when she was seen by her PCP on Thursday.  Per her daughter EKG done was normal.  She had a chest CT that showed large pericardial effusion per her report.  Her Lasix  was increased to 20 mg p.o. twice daily and she was given prednisone 20 mg p.o. daily.  Her antianxiety medication was increased.  Her potassium was 6.2 and she was given Kayexalate  for 3 days.  She denied any fever or chills.  No nausea or vomiting or abdominal pain.  Today she has been feeling very weak.  She has not been out of her bed.  She did not take her insulin .  Her neighbor called her daughter to come and see her when she brought her to the ER.  She has a history of right nephrectomy for renal cancer and 2008 and had a history of colon cancer as well status post resection.  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  ED Course: When she came to the ER, BP was 137/113 with heart rate of 146 and a respiratory rate of 26 with pulse  oximetry of 97% on 2 L of O2 by nasal cannula.  Labs revealed VBG with pH 7.43 and HCO3 of 26.5 and proBNP of 719 and high sensitive troponin of 38 and later 39 potassium was 5.6 and sodium 134 with chloride of 96 and glucose 442.  BUN was 54 and creatinine 1.22 with calcium  7.6.  Alk phos was 181 and albumin  3.3 with AST of 60.  CBC showed hemoglobin of 9.3 hematocrit 29.2 better than previous levels with WBCs of 11.4 with neutrophilia.  UA showed more than 500 glucose and was otherwise negative. EKG as reviewed by me : EKG showed atrial fibrillation with rapid ventricular response of 144 Imaging: Portable chest x-ray showed the following: Cardiomegaly with central vascular congestion and mild diffuse interstitial opacity likely underlying edema. Cardiac enlargement may be in part due to pericardial effusion which was noted on recent CT imaging. Moderate left effusion with persistent dense airspace disease at the left base.  Chest CTA revealed the following: 1. No evidence for pulmonary embolism. 2. Large pericardial effusion. 3. Small bilateral pleural effusions. 4. Pleural thickening surrounding the left pleural effusion with small locules of air within the effusion. Findings are concerning for empyema. 5. Consolidation/collapse of the lingula and left lower lobe. 6. Right lower lobe atelectasis. 7. Findings compatible with cirrhosis and portal hypertension.  The patient was given 10 g of p.o. Lokelma  and 1 L bolus of IV  normal saline, 0.5 mg of IV Ativan and IV Cardizem bolus of 10 mg followed by IV Cardizem drip.  She will be admitted to a progressive unit bed for further evaluation and management. PAST MEDICAL HISTORY:   Past Medical History:  Diagnosis Date   Anemia    Anxiety    Colon cancer (HCC)    Diabetes mellitus without complication (HCC)    Family history of adverse reaction to anesthesia    daughter had reaction to anesthesia and was in icu   GERD (gastroesophageal  reflux disease)    Hypertension    Hypothyroidism    Kidney cancer, primary, with metastasis from kidney to other site [facility]) 08/26/2014   right   Pancreas cancer (HCC) 2018   Thyroid  cancer (HCC)     PAST SURGICAL HISTORY:   Past Surgical History:  Procedure Laterality Date   CHOLECYSTECTOMY     COLON RESECTION     COLONOSCOPY WITH PROPOFOL  N/A 09/01/2015   Procedure: COLONOSCOPY WITH PROPOFOL ;  Surgeon: Lamar ONEIDA Holmes, MD;  Location: [facility] ENDOSCOPY;  Service: Endoscopy;  Laterality: N/A;   CYSTOSCOPY  12/28/2016   Procedure: CYSTOSCOPY;  Surgeon: Ward, Mitzie BROCKS, MD;  Location: [facility] ORS;  Service: Gynecology;;   ESOPHAGOGASTRODUODENOSCOPY (EGD) WITH PROPOFOL  N/A 09/01/2015   Procedure: ESOPHAGOGASTRODUODENOSCOPY (EGD) WITH PROPOFOL ;  Surgeon: Lamar ONEIDA Holmes, MD;  Location: [facility] ENDOSCOPY;  Service: Endoscopy;  Laterality: N/A;   EUS N/A 06/28/2016   Procedure: FULL UPPER ENDOSCOPIC ULTRASOUND (EUS) RADIAL;  Surgeon: Deward Napoleon, MD;  Location: [facility] ENDOSCOPY;  Service: Endoscopy;  Laterality: N/A;   EXCISION MASS NECK N/A 09/20/2015   Procedure: EXCISION MASS NECK;  Surgeon: Chinita Hasten, MD;  Location: [facility];  Service: ENT;  Laterality: N/A;   LAPAROSCOPIC BILATERAL SALPINGO OOPHERECTOMY Bilateral 12/28/2016   Procedure: LAPAROSCOPIC BILATERAL SALPINGO OOPHORECTOMY;  Surgeon: Ward, Mitzie BROCKS, MD;  Location: [facility] ORS;  Service: Gynecology;  Laterality: Bilateral;   LAPAROSCOPIC HYSTERECTOMY N/A 12/28/2016   Procedure: HYSTERECTOMY TOTAL LAPAROSCOPIC;  Surgeon: Ward, Mitzie BROCKS, MD;  Location: [facility] ORS;  Service: Gynecology;  Laterality: N/A;   LAPAROSCOPIC LYSIS OF ADHESIONS  12/28/2016   Procedure: LAPAROSCOPIC EXTENSIVE LYSIS OF ADHESIONS;  Surgeon: Ward, Mitzie BROCKS, MD;  Location: [facility] ORS;  Service: Gynecology;;   rt kidney removed Right    THYROGLOSSAL DUCT CYST Right 09/20/2015   Procedure: excision sub mandibular gland right;  Surgeon: Chinita Hasten, MD;  Location:  [facility];  Service: ENT;  Laterality: Right;   THYROID  SURGERY Bilateral    TOTAL KNEE ARTHROPLASTY Right    TOTAL KNEE ARTHROPLASTY Left 08/09/2014   Procedure: TOTAL KNEE ARTHROPLASTY;  Surgeon: Lynwood SHAUNNA Hue, MD;  Location: [facility] ORS;  Service: Orthopedics;  Laterality: Left;    SOCIAL HISTORY:   Social History   Tobacco Use   Smoking status: Former    Current packs/day: 0.00    Types: Cigarettes    Quit date: 07/29/1994    Years since quitting: 29.5   Smokeless tobacco: Never  Substance Use Topics   Alcohol use: No    FAMILY HISTORY:   Family History  Problem Relation Age of Onset   Breast cancer Maternal Aunt    Breast cancer Cousin     DRUG ALLERGIES:  Allergies[1]  REVIEW OF SYSTEMS:   ROS As per history of present illness. All pertinent systems were reviewed above. Constitutional, HEENT, cardiovascular, respiratory, GI, GU, musculoskeletal, neuro, psychiatric, endocrine, integumentary and hematologic systems were reviewed and are otherwise negative/unremarkable except for  positive findings mentioned above in the HPI.   MEDICATIONS AT HOME:   Prior to Admission medications  Medication Sig Start Date End Date Taking? Authorizing Provider  acetaminophen  (TYLENOL ) 325 MG tablet Take 650 mg by mouth every morning.   Yes [provider]  alendronate (FOSAMAX) 70 MG tablet Take 70 mg by mouth once a week. 02/03/24  Yes [provider]  ALPRAZolam  (XANAX ) 0.25 MG tablet Take 0.25 mg by mouth at bedtime. 10/04/22  Yes [provider]  busPIRone (BUSPAR) 10 MG tablet Take 10 mg by mouth 2 (two) times daily. 10/09/23 10/08/24 Yes [provider]  calcitRIOL  (ROCALTROL ) 0.25 MCG capsule Take 0.25 mcg by mouth. 11/23/23  Yes [provider]  calcium  carbonate (OS-CAL - DOSED IN MG OF ELEMENTAL CALCIUM ) 1250 (500 Ca) MG tablet Take 2 tablets (2,500 mg total) by mouth 3 (three) times daily with meals for 10 days. 10/19/23 02/06/24  Yes Dezii, Alexandra, DO  Cholecalciferol  (VITAMIN D -1000 MAX ST) 25 MCG (1000 UT) tablet Take 1,000 Units by mouth daily.   Yes [provider]  Darbepoetin Alfa -Albumin  (ARANESP  IJ) Inject 200 mcg as directed every 14 (fourteen) days.   Yes [provider]  dicyclomine  (BENTYL ) 10 MG capsule Take 10 mg by mouth 2 (two) times daily. 10/09/23  Yes [provider]  empagliflozin  (JARDIANCE ) 10 MG TABS tablet Take 1 tablet by mouth daily. 08/29/22  Yes [provider]  ferrous sulfate  325 (65 FE) MG tablet Take 1 tablet (325 mg total) by mouth daily. 04/07/19 02/06/24 Yes Wieting, Richard, MD  fexofenadine (ALLEGRA) 180 MG tablet Take 180 mg by mouth every morning. 04/24/19  Yes [provider]  fluticasone  (FLONASE ) 50 MCG/ACT nasal spray Place 1 spray into both nostrils daily as needed for allergies or rhinitis.   Yes [provider]  furosemide  (LASIX ) 40 MG tablet Take 20-40 mg by mouth daily. 10/04/22  Yes [provider]  HUMALOG  KWIKPEN 100 UNIT/ML KwikPen Inject 0.08 mLs (8 Units total) into the skin 3 (three) times daily. Per sliding scale Patient taking differently: Inject 1-8 Units into the skin 3 (three) times daily. Per sliding scale 04/07/19  Yes Wieting, Richard, MD  letrozole (FEMARA) 2.5 MG tablet Take 2.5 mg by mouth daily. 01/28/24 01/27/25 Yes [provider]  levothyroxine  (SYNTHROID ) 150 MCG tablet Take 150 mcg by mouth daily.   Yes [provider]  magnesium  oxide (MAG-OX) 400 (240 Mg) MG tablet Take 1 tablet by mouth 2 (two) times daily. 01/09/24  Yes [provider]  magnesium  oxide (MAG-OX) 400 MG tablet Take 400 mg by mouth 2 (two) times daily. 10/08/22  Yes [provider]  midodrine (PROAMATINE) 2.5 MG tablet Take 2.5 mg by mouth 3 (three) times daily with meals. 01/20/24 01/19/25 Yes [provider]  predniSONE (DELTASONE) 20 MG tablet Take 20 mg by mouth daily with breakfast.  01/29/24 02/08/24 Yes [provider]  sertraline (ZOLOFT) 25 MG tablet Take 25 mg by mouth at bedtime. 01/29/24 01/28/25 Yes [provider]  TRESIBA  FLEXTOUCH 200 UNIT/ML SOPN Inject 26 Units into the skin every evening. Patient taking differently: Inject 60 Units into the skin at bedtime. 04/07/19  Yes Wieting, Richard, MD  vitamin B-12 (CYANOCOBALAMIN ) 1000 MCG tablet Take 1,000 mcg by mouth daily. Reported on 07/07/2015   Yes [provider]  Accu-Chek FastClix Lancets MISC USE 1 EACH FOUR TIMES DAILY. USE AS INSTRUCTED 11/08/22   [provider]  B-D UF III MINI  PEN NEEDLES 31G X 5 MM MISC 4 (four) times daily. as directed 10/31/22   [provider]  [Paused] calcium  acetate (PHOSLO ) 667 MG capsule Take 1 capsule by mouth 2 (two) times daily. Wait to take this until your doctor or other care provider tells you to start again. 01/01/23   [provider]  cyclobenzaprine (FLEXERIL) 5 MG tablet Take 1 tablet by mouth 3 (three) times daily as needed. Patient not taking: Reported on 02/06/2024 08/02/23   [provider]  DULoxetine (CYMBALTA) 30 MG capsule Take 30 mg by mouth daily. Patient not taking: Reported on 02/06/2024 10/09/23 10/08/24  [provider]  HYDROcodone -acetaminophen  (NORCO) 10-325 MG tablet Take by mouth. Patient not taking: Reported on 02/06/2024 09/17/22   [provider]  levothyroxine  (SYNTHROID ) 200 MCG tablet Take 200 mcg by mouth daily before breakfast. Patient not taking: Reported on 02/06/2024 06/28/23 06/27/24  [provider]  losartan  (COZAAR ) 25 MG tablet Take 1 tablet by mouth at bedtime. 11/15/22 11/15/23  [provider]  mirtazapine (REMERON) 15 MG tablet Take 15 mg by mouth at bedtime. Patient not taking: Reported on 02/06/2024 10/09/23 10/08/24  [provider]  pantoprazole  (PROTONIX ) 40 MG tablet Take 40 mg by mouth 2 (two) times daily. Patient not taking: Reported on  02/06/2024    [provider]  sodium polystyrene (SPS, SODIUM POLYSTYRENE SULF,) 15 GM/60ML suspension Take 60 mLs (15 g total) by mouth daily for 3 days. 02/03/24 02/06/24    [Paused] verapamil (CALAN-SR) 120 MG CR tablet Take by mouth. Wait to take this until your doctor or other care provider tells you to start again. 11/27/22 11/27/23  [provider]      VITAL SIGNS:  Blood pressure (!) 131/47, pulse 85, temperature 98.4 F (36.9 C), resp. rate 20, height 5' 3 (1.6 m), weight 88.2 kg, SpO2 94%.  PHYSICAL EXAMINATION:  Physical Exam  GENERAL:  78 y.o.-year-old patient lying in the bed with no acute distress.  EYES: Pupils equal, round, reactive to light and accommodation. No scleral icterus. Extraocular muscles intact.  HEENT: Head atraumatic, normocephalic. Oropharynx and nasopharynx clear.  NECK:  Supple, no jugular venous distention. No thyroid  enlargement, no tenderness.  LUNGS: Diminished bibasilar breath sounds with bibasilar rales.. No use of accessory muscles of respiration.  CARDIOVASCULAR: Irregularly irregular rhythm, S1, S2 normal. No murmurs, rubs, or gallops.  ABDOMEN: Soft, nondistended, nontender. Bowel sounds present. No organomegaly or mass.  EXTREMITIES: Trace bilateral lower extremity pitting edema with no cyanosis, or clubbing.  NEUROLOGIC: Cranial nerves II through XII are intact. Muscle strength 5/5 in all extremities. Sensation intact. Gait not checked.  PSYCHIATRIC: The patient is alert and oriented x 3.  Normal affect and good eye contact. SKIN: No obvious rash, lesion, or ulcer.   LABORATORY PANEL:   CBC Recent Labs  Lab 02/07/24 0427  WBC 10.3  HGB 8.6*  HCT 27.0*  PLT 230   ------------------------------------------------------------------------------------------------------------------  Chemistries  Recent Labs  Lab 02/06/24 1821 02/07/24 0427  NA 134* 135  K 5.6* 4.2  CL 96* 99  CO2 23 25  GLUCOSE 442* 327*  BUN 54*  56*  CREATININE 1.22* 1.29*  CALCIUM  7.6* 7.8*  AST 60*  --   ALT 31  --   ALKPHOS 181*  --   BILITOT 0.5  --    ------------------------------------------------------------------------------------------------------------------  Cardiac Enzymes No results for input(s): TROPONINI in the last 168 hours. ------------------------------------------------------------------------------------------------------------------  RADIOLOGY:  DG Chest Port 1 View Result Date:  02/06/2024 CLINICAL DATA:  Chest pain short of breath EXAM: PORTABLE CHEST 1 VIEW COMPARISON:  11/27/2023, chest CT 01/29/2024 FINDINGS: Cardiomegaly with central vascular congestion and mild diffuse interstitial opacity likely underlying edema. Moderate left effusion and persistent dense airspace disease at the left base. Aortic atherosclerosis. IMPRESSION: Cardiomegaly with central vascular congestion and mild diffuse interstitial opacity likely underlying edema. Cardiac enlargement may be in part due to pericardial effusion which was noted on recent CT imaging. Moderate left effusion with persistent dense airspace disease at the left base. Electronically Signed   By: Luke Bun M.D.   On: 02/06/2024 18:56      IMPRESSION AND PLAN:  Assessment and Plan: * Atrial fibrillation with RVR (HCC) - The patient will be admitted to a progressive unit bed. - Will continue IV Cardizem drip. - Given elevated Chads 2 VASc score, will place her on p.o. Eliquis. - Cardiology consult to be obtained. -I notified Dr. Ammon about the patient.  Acute on chronic diastolic CHF (congestive heart failure) (HCC) - The patient will be gently diuresed with IV Lasix  given borderline BP. - Will continue Jardiance . - Last 2D echo revealed diastolic dysfunction grade 1 with an EF of 70 to 75% in 2021.  Hypothyroidism - Will continue Synthroid .  Type 2 diabetes mellitus with chronic kidney disease, with long-term current use of insulin   (HCC) - The patient will be placed on supplement coverage with NovoLog . - Will continue basal coverage. - Will continue Jardiance .  Anxiety and depression - Will continue BuSpar, Xanax , Remeron and Zoloft.       DVT prophylaxis: Eliquis Advanced Care Planning:  Code Status: full code. Family Communication:  The plan of care was discussed in details with the patient (and family). I answered all questions. The patient agreed to proceed with the above mentioned plan. Further management will depend upon hospital course. Disposition Plan: Back to previous home environment Consults called: Cardiology All the records are reviewed and case discussed with ED provider.  Status is: Inpatient    At the time of the admission, it appears that the appropriate admission status for this patient is inpatient.  This is judged to be reasonable and necessary in order to provide the required intensity of service to ensure the patient's safety given the presenting symptoms, physical exam findings and initial radiographic and laboratory data in the context of comorbid conditions.  The patient requires inpatient status due to high intensity of service, high risk of further deterioration and high frequency of surveillance required.  I certify that at the time of admission, it is my clinical judgment that the patient will require inpatient hospital care extending more than 2 midnights.                            Dispo: The patient is from: Home              Anticipated d/c is to: Home              Patient currently is not medically stable to d/c.              Difficult to place patient: No  [place] DELENA Peaches M.D on 02/07/2024 at 5:23 AM  [facility]   From 7 PM-7 AM, contact night-coverage [email or web address]  CC: Primary care physician; Auston Reyes BIRCH, MD      [1]  Allergies Allergen Reactions   Statins     Cirrhosis, unable to tolerate

## 2024-02-07 ENCOUNTER — Telehealth (HOSPITAL_COMMUNITY): Payer: Self-pay | Admitting: Pharmacy Technician

## 2024-02-07 ENCOUNTER — Encounter: Payer: Self-pay | Admitting: Internal Medicine

## 2024-02-07 ENCOUNTER — Other Ambulatory Visit (HOSPITAL_COMMUNITY): Payer: Self-pay

## 2024-02-07 DIAGNOSIS — E1122 Type 2 diabetes mellitus with diabetic chronic kidney disease: Secondary | ICD-10-CM

## 2024-02-07 DIAGNOSIS — D649 Anemia, unspecified: Secondary | ICD-10-CM

## 2024-02-07 DIAGNOSIS — I3139 Other pericardial effusion (noninflammatory): Secondary | ICD-10-CM

## 2024-02-07 DIAGNOSIS — F419 Anxiety disorder, unspecified: Secondary | ICD-10-CM

## 2024-02-07 DIAGNOSIS — E875 Hyperkalemia: Secondary | ICD-10-CM

## 2024-02-07 DIAGNOSIS — I5033 Acute on chronic diastolic (congestive) heart failure: Secondary | ICD-10-CM

## 2024-02-07 LAB — CBG MONITORING, ED
Glucose-Capillary: 283 mg/dL — ABNORMAL HIGH (ref 70–99)
Glucose-Capillary: 191 mg/dL — ABNORMAL HIGH (ref 70–99)
Glucose-Capillary: 169 mg/dL — ABNORMAL HIGH (ref 70–99)
Glucose-Capillary: 158 mg/dL — ABNORMAL HIGH (ref 70–99)
Glucose-Capillary: 255 mg/dL — ABNORMAL HIGH (ref 70–99)

## 2024-02-07 LAB — CBC
HCT: 27 % — ABNORMAL LOW (ref 36.0–46.0)
Hemoglobin: 8.6 g/dL — ABNORMAL LOW (ref 12.0–15.0)
MCH: 27.1 pg (ref 26.0–34.0)
MCHC: 31.9 g/dL (ref 30.0–36.0)
MCV: 85.2 fL (ref 80.0–100.0)
Platelets: 230 K/uL (ref 150–400)
RBC: 3.17 MIL/uL — ABNORMAL LOW (ref 3.87–5.11)
RDW: 23.1 % — ABNORMAL HIGH (ref 11.5–15.5)
WBC: 10.3 K/uL (ref 4.0–10.5)
nRBC: 0 % (ref 0.0–0.2)

## 2024-02-07 LAB — BASIC METABOLIC PANEL WITH GFR
Anion gap: 11 (ref 5–15)
BUN: 56 mg/dL — ABNORMAL HIGH (ref 8–23)
CO2: 25 mmol/L (ref 22–32)
Calcium: 7.8 mg/dL — ABNORMAL LOW (ref 8.9–10.3)
Chloride: 99 mmol/L (ref 98–111)
Creatinine, Ser: 1.29 mg/dL — ABNORMAL HIGH (ref 0.44–1.00)
GFR, Estimated: 42 mL/min — ABNORMAL LOW (ref >60)
Glucose, Bld: 327 mg/dL — ABNORMAL HIGH (ref 70–99)
Potassium: 4.2 mmol/L (ref 3.5–5.1)
Sodium: 135 mmol/L (ref 135–145)

## 2024-02-07 MED ORDER — DILTIAZEM HCL ER COATED BEADS 120 MG PO CP24
120.0000 mg | ORAL_CAPSULE | Freq: Every day | ORAL | Status: DC
  Administered 2024-02-07 – 2024-02-08 (×2): 120 mg via ORAL
  Filled 2024-02-07 (×2): qty 1

## 2024-02-07 MED ORDER — INSULIN ASPART 100 UNIT/ML IJ SOLN
0.0000 [IU] | Freq: Every day | INTRAMUSCULAR | Status: DC
  Administered 2024-02-07: 3 [IU] via SUBCUTANEOUS
  Filled 2024-02-07: qty 3

## 2024-02-07 MED ORDER — INSULIN ASPART 100 UNIT/ML IJ SOLN
0.0000 [IU] | Freq: Three times a day (TID) | INTRAMUSCULAR | Status: DC
  Administered 2024-02-07: 2 [IU] via SUBCUTANEOUS
  Administered 2024-02-08: 1 [IU] via SUBCUTANEOUS
  Administered 2024-02-08: 5 [IU] via SUBCUTANEOUS
  Filled 2024-02-07: qty 2
  Filled 2024-02-07: qty 5
  Filled 2024-02-07: qty 1

## 2024-02-07 MED ORDER — APIXABAN 5 MG PO TABS
5.0000 mg | ORAL_TABLET | Freq: Two times a day (BID) | ORAL | Status: DC
  Administered 2024-02-07 – 2024-02-08 (×3): 5 mg via ORAL
  Filled 2024-02-07 (×3): qty 1

## 2024-02-07 NOTE — Assessment & Plan Note (Signed)
 Status post Lokelma  on 12/11 Resolved Check BMP in a.m.

## 2024-02-07 NOTE — ED Notes (Signed)
 Hospice RN at the bedside with patient and family.

## 2024-02-07 NOTE — ED Notes (Signed)
 Cardiologist at bedside.

## 2024-02-07 NOTE — Consult Note (Signed)
 Consultation Note Date: 02/07/2024   Patient Name: Rebecca Kirby  DOB: August 30, 1945  MRN: [id number]  Age / Sex: 78 y.o., female  PCP: Auston Reyes BIRCH, MD Referring Physician: Sherre Greig SAILOR, DO  Reason for Consultation: Establishing goals of care   HPI/Brief Hospital Course: 78 y.o. female  with past medical history of colon cancer status post resection, renal cancer status post right nephrectomy, hypertension, hypothyroidism, anxiety, CHF, cirrhosis, chronic and recurrent pleural effusions found to be negative for malignancy and CKD stage III admitted from home on 02/06/2024 with progressive and worsening shortness of breath x 1 week.  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  After many consultations, current malignancy found to be inoperable and incurable at this time. Treatment is palliative focused.  Palliative medicine was consulted for assisting with goals of care conversations.  Subjective:  Extensive chart review has been completed prior to meeting patient including labs, vital signs, imaging, progress notes, orders, and available advanced directive documents from current and previous encounters.  Visited with [name] at her bedside. She is awake, alert and able to engage in conversation. She is HOH and left her hearing aids at home, complicating visit. Daughter and son in law at bedside during time of visit.  Introduced myself as a publishing rights manager as a member of the palliative care team. Explained palliative medicine is specialized medical care for people living with serious illness. It focuses on providing relief from the symptoms and stress of a serious illness. The goal is to improve quality of life for both the patient and the family.   Daughter at bedside shares being familiar with Palliative/Hospice services  as Ms. Pfost was recently referred by her oncologist. That referral was placed for [facility], daughter had recently attempted contact with [employer] out of [place].  Daughter shares a brief life review. She speaks to [name]'s cancer history of colon and renal cancer that metastasized. [name] is followed by [facility] oncology, she is also closely followed by her PCP her locally. Throughout her history, Ms. Cho has tried to remain as independent as she could. Currently, at baseline, [name] lives alone and functions independently. Her daughter and son in law visit with her daily and assist with meal preparation.  Daughter speaks to Ms Morriss's decline over the last several weeks since her last oncology visit. Daughter voices concern related to [name]'s full understanding of the advancement of her disease. Daughter shares a few days prior, [name] requested to move in with her as she did not feel she was safe to live alone. Daughter shares initially symptoms of shortness of breath and anxiety were treated by adjusting psychiatric medicine as well as imaging. Daughter able to share her understanding of [name]'s current conditions and treatments for A. Fib with RVR, CHF and CKD. Daughter expresses concern regarding [name] having an understanding of her multiple underlying comorbid conditions. Daughter shares cardiology visited bedside earlier and recommended consideration of hospice.  Expressed overall concerns, reasons for hospitalization and treatment options. We also discussed continuing with current treatment versus shifting her focus to comfort care.  Discussed in depth the difference between quality quantity of life.  We discussed patient's current illness and what it means in the larger context of patient's on-going co-morbidities. Natural disease trajectory and expectations at EOL were discussed.   At this time Mr. Kirschenbaum shares she wishes  to continue with current treatment  and interventions.  She shares her long-term goal is to see her grandson graduate PA school.  She shares in conversation with her oncologist she feels with her current regimen she can continue to improve.  We again discussed not only her underlying advanced metastatic breast cancer but also her underlying cardiac and renal impairments.  No change to goals of care at this time.  I discussed importance of continued conversations with family/support persons and all members of their medical team regarding overall plan of care and treatment options ensuring decisions are in alignment with patients goals of care.  All questions/concerns addressed. Emotional support provided to patient/family/support persons. PMT will continue to follow and support patient as needed.  Objective: Primary Diagnoses: Present on Admission:  Atrial fibrillation with RVR (HCC)  Hypothyroidism  HTN (hypertension)  Obesity (BMI 30-39.9)   Physical Exam Constitutional:      General: She is not in acute distress.    Appearance: She is obese. She is ill-appearing.  HENT:     Head: Normocephalic.     Mouth/Throat:     Mouth: Mucous membranes are dry.  Pulmonary:     Effort: Pulmonary effort is normal. No respiratory distress.  Abdominal:     General: There is no distension.     Palpations: Abdomen is soft.  Skin:    General: Skin is warm and dry.  Neurological:     Mental Status: She is alert and oriented to person, place, and time.     Motor: Weakness present.  Psychiatric:        Mood and Affect: Mood normal.        Behavior: Behavior normal.        Thought Content: Thought content normal.     Vital Signs: BP (!) 137/48   Pulse 72   Temp 97.9 F (36.6 C) (Oral)   Resp 20   Ht 5' 3 (1.6 m)   Wt 88.2 kg   SpO2 100%   BMI 34.45 kg/m  Pain Scale: 0-10   Pain Score: 0-No pain  IO: Intake/output summary:  Intake/Output Summary (Last 24 hours) at 02/07/2024 1523 Last data filed at 02/06/2024  2031 Gross per 24 hour  Intake 1000 ml  Output --  Net 1000 ml    LBM:   Baseline Weight: Weight: 88.2 kg Most recent weight: Weight: 88.2 kg      Assessment and Plan  SUMMARY OF RECOMMENDATIONS   Continue with current plan of care Ongoing GOC  Palliative Prophylaxis:   Bowel Regimen, Delirium Protocol and Frequent Pain Assessment    Thank you for this consult and allowing Palliative Medicine to participate in the care of ***. Palliative medicine will continue to follow and assist as needed.   Time Total: ***  Time spent includes: Detailed review of medical records (labs, imaging, vital signs), medically appropriate exam (mental status, respiratory, cardiac, skin), discussed with treatment team, counseling and educating patient, family and staff, documenting clinical information, medication management and coordination of care.   Signed by: Waddell Lesches, DNP, AGNP-C Palliative Medicine    Please contact Palliative Medicine Team phone at [phone] for questions and concerns.  For individual provider: See Tracey

## 2024-02-07 NOTE — Assessment & Plan Note (Addendum)
 Initial value was 38 and on repeat was 39 12/13: Patient has no chest pain or shortness of breath at this time

## 2024-02-07 NOTE — Progress Notes (Signed)
 Heart Failure Navigator Progress Note  Assessed for Heart & Vascular TOC clinic readiness.  Patient does not meet criteria due to current [facility] CHF clinic patient.   Navigator will sign off at this time.  Charmaine Pines, RN, BSN [facility] Heart Failure Navigator Secure Chat Only

## 2024-02-07 NOTE — ED Notes (Signed)
 Awaiting PO from pharmacy and will discontinue infusion 1-2 hrs after PO based on MAr instructions

## 2024-02-07 NOTE — Assessment & Plan Note (Addendum)
-   The patient will be admitted to a progressive unit bed. - Will continue IV Cardizem  drip. - Given elevated Chads 2 VASc score, will place her on p.o. Eliquis . - Cardiology consult to be obtained. -I notified Dr. Ammon about the patient.  02/07/2024: I assumed care of the patient.  Diltiazem  gtt. has been discontinued.  Diltiazem  120 mg extended release daily initiated.  Complete echo ordered.  Noted patient on apixaban  5 mg p.o. twice daily. Dr. Ammon saw patient at bedside per daughter.   Daughter does want to continue liquids however would like to have her cbc monitored due to history of multiple AVMs requiring blood transfusion (at [facility]) when hgb <6. 12/13: Hemoglobin is 9.3.  No signs of active bleeding.  Continue Eliquis  and monitor

## 2024-02-07 NOTE — Assessment & Plan Note (Signed)
 Secondary to history of avms. Per daughter at bedside, she goes to [facility] for transfusion when hgb < 6.  12/13: Hemoglobin is 9.3 today. Recheck CBC in the a.m.

## 2024-02-07 NOTE — Assessment & Plan Note (Signed)
 Home losartan  25 mg nightly.  Patient also on midodrine 2.5 mg 3 times daily with meals.  Furosemide  40 mg IV twice daily 12/12 diltiazem 120 mg daily initiated

## 2024-02-07 NOTE — Consult Note (Signed)
 [facility] Cardiology  CARDIOLOGY CONSULT NOTE  Patient ID: Rebecca Kirby MRN: [id number] DOB/AGE: [birth year] 78 y.o.  Admit date: 02/06/2024 Referring Physician [facility] Primary Physician [facility] Primary Cardiologist [place] Reason for Consultation atrial fibrillation  HPI: 78 year old female referred for evaluation of atrial fibrillation.  The patient has a history of stage IV metastatic breast cancer.  According to her daughter, she has had a recent line in her health.  She presents to [facility] emergency room with worsening shortness of breath.  Patient noted to be tachycardic, ECG revealed atrial fibrillation with rapid ventricular rate.  Chest CT 01/29/2024 revealed large pericardial effusion with cirrhosis and portal hypertension.  In the emergency room, patient treated with Cardizem IV bolus and infusion with conversion to sinus rhythm at 86 bpm.  Admission labs notable for elevated BUN and creatinine of 56 and 1.29, respectively.  Troponin was mildly elevated (38, 39) in the absence of chest pain.  Patient noted to be anemic with hemoglobin and hematocrit at 8.6 and 27.0, respectively.  Review of systems complete and found to be negative unless listed above     Past Medical History:  Diagnosis Date   Anemia    Anxiety    Colon cancer (HCC)    Diabetes mellitus without complication (HCC)    Family history of adverse reaction to anesthesia    daughter had reaction to anesthesia and was in icu   GERD (gastroesophageal reflux disease)    Hypertension    Hypothyroidism    Kidney cancer, primary, with metastasis from kidney to other site [facility]) 08/26/2014   right   Pancreas cancer (HCC) 2018   Thyroid  cancer (HCC)     Past Surgical History:  Procedure Laterality Date   CHOLECYSTECTOMY     COLON RESECTION     COLONOSCOPY WITH PROPOFOL  N/A 09/01/2015   Procedure: COLONOSCOPY WITH PROPOFOL ;  Surgeon: Lamar ONEIDA Holmes, MD;  Location: [facility] ENDOSCOPY;  Service: Endoscopy;  Laterality: N/A;    CYSTOSCOPY  12/28/2016   Procedure: CYSTOSCOPY;  Surgeon: Ward, Mitzie BROCKS, MD;  Location: [facility] ORS;  Service: Gynecology;;   ESOPHAGOGASTRODUODENOSCOPY (EGD) WITH PROPOFOL  N/A 09/01/2015   Procedure: ESOPHAGOGASTRODUODENOSCOPY (EGD) WITH PROPOFOL ;  Surgeon: Lamar ONEIDA Holmes, MD;  Location: [facility] ENDOSCOPY;  Service: Endoscopy;  Laterality: N/A;   EUS N/A 06/28/2016   Procedure: FULL UPPER ENDOSCOPIC ULTRASOUND (EUS) RADIAL;  Surgeon: Deward Napoleon, MD;  Location: [facility] ENDOSCOPY;  Service: Endoscopy;  Laterality: N/A;   EXCISION MASS NECK N/A 09/20/2015   Procedure: EXCISION MASS NECK;  Surgeon: Chinita Hasten, MD;  Location: [facility] SURGERY [facility]TR;  Service: ENT;  Laterality: N/A;   LAPAROSCOPIC BILATERAL SALPINGO OOPHERECTOMY Bilateral 12/28/2016   Procedure: LAPAROSCOPIC BILATERAL SALPINGO OOPHORECTOMY;  Surgeon: Ward, Mitzie BROCKS, MD;  Location: [facility] ORS;  Service: Gynecology;  Laterality: Bilateral;   LAPAROSCOPIC HYSTERECTOMY N/A 12/28/2016   Procedure: HYSTERECTOMY TOTAL LAPAROSCOPIC;  Surgeon: Ward, Mitzie BROCKS, MD;  Location: [facility] ORS;  Service: Gynecology;  Laterality: N/A;   LAPAROSCOPIC LYSIS OF ADHESIONS  12/28/2016   Procedure: LAPAROSCOPIC EXTENSIVE LYSIS OF ADHESIONS;  Surgeon: Ward, Mitzie BROCKS, MD;  Location: [facility] ORS;  Service: Gynecology;;   rt kidney removed Right    THYROGLOSSAL DUCT CYST Right 09/20/2015   Procedure: excision sub mandibular gland right;  Surgeon: Chinita Hasten, MD;  Location: [facility]ERY [facility];  Service: ENT;  Laterality: Right;   THYROID  SURGERY Bilateral    TOTAL KNEE ARTHROPLASTY Right    TOTAL KNEE ARTHROPLASTY Left 08/09/2014   Procedure: TOTAL KNEE ARTHROPLASTY;  Surgeon:  Lynwood SHAUNNA Hue, MD;  Location: [facility] ORS;  Service: Orthopedics;  Laterality: Left;    (Not in a hospital admission)  Social History   Socioeconomic History   Marital status: Widowed    Spouse name: Not on file   Number of children: Not on file   Years of education: Not on file   Highest  education level: Not on file  Occupational History   Not on file  Tobacco Use   Smoking status: Former    Current packs/day: 0.00    Types: Cigarettes    Quit date: 07/29/1994    Years since quitting: 29.5   Smokeless tobacco: Never  Vaping Use   Vaping status: Never Used  Substance and Sexual Activity   Alcohol use: No   Drug use: No   Sexual activity: Not Currently  Other Topics Concern   Not on file  Social History Narrative   Not on file   Social Drivers of Health   Tobacco Use: Medium Risk (02/07/2024)   Patient History    Smoking Tobacco Use: Former    Smokeless Tobacco Use: Never    Passive Exposure: Not on Actuary Strain: Low Risk  (11/28/2023)   Received from [facility]   Overall Financial Resource Strain (CARDIA)    Difficulty of Paying Living Expenses: Not hard at all  Food Insecurity: No Food Insecurity (11/28/2023)   Received from [facility]   Epic    Within the past 12 months, you worried that your food would run out before you got the money to buy more.: Never true    Within the past 12 months, the food you bought just didn't last and you didn't have money to get more.: Never true  Transportation Needs: No Transportation Needs (11/28/2023)   Received from [facility] - Transportation    In the past 12 months, has lack of transportation kept you from medical appointments or from getting medications?: No    Lack of Transportation (Non-Medical): No  Physical Activity: Not on file  Stress: Not on file  Social Connections: Moderately Integrated (10/17/2023)   Social Connection and Isolation Panel    Frequency of Communication with Friends and Family: More than three times a week    Frequency of Social Gatherings with Friends and Family: More than three times a week    Attends Religious Services: More than 4 times per year    Active Member of [employer] or Organizations: Yes    Attends  Banker Meetings: More than 4 times per year    Marital Status: Widowed  Intimate Partner Violence: Not At Risk (10/17/2023)   Epic    Fear of Current or Ex-Partner: No    Emotionally Abused: No    Physically Abused: No    Sexually Abused: No  Depression (PHQ2-9): Low Risk (01/14/2023)   Depression (PHQ2-9)    PHQ-2 Score: 4  Alcohol Screen: Not on file  Housing: Low Risk  (11/28/2023)   Received from [facility]   Epic    In the last 12 months, was there a time when you were not able to pay the mortgage or rent on time?: No    In the past 12 months, how many times have you moved where you were living?: 0    At any time in the past 12 months, were you homeless or living in a shelter (including now)?: No  Utilities: Not  At Risk (11/28/2023)   Received from [facility]   Epic    In the past 12 months has the electric, gas, oil, or water company threatened to shut off services in your home?: No  Health Literacy: Not on file    Family History  Problem Relation Age of Onset   Breast cancer Maternal Aunt    Breast cancer Cousin       Review of systems complete and found to be negative unless listed above      PHYSICAL EXAM  General: Well developed, well nourished, in no acute distress HEENT:  Normocephalic and atramatic Neck:  No JVD.  Lungs: Clear bilaterally to auscultation and percussion. Heart: HRRR . Normal S1 and S2 without gallops or murmurs.  Abdomen: Bowel sounds are positive, abdomen soft and non-tender  Msk:  Back normal, normal gait. Normal strength and tone for age. Extremities: No clubbing, cyanosis or edema.   Neuro: Alert and oriented X 3. Psych:  Good affect, responds appropriately  Labs:   Lab Results  Component Value Date   WBC 10.3 02/07/2024   HGB 8.6 (L) 02/07/2024   HCT 27.0 (L) 02/07/2024   MCV 85.2 02/07/2024   PLT 230 02/07/2024    Recent Labs  Lab 02/06/24 1821 02/07/24 0427  NA 134* 135   K 5.6* 4.2  CL 96* 99  CO2 23 25  BUN 54* 56*  CREATININE 1.22* 1.29*  CALCIUM  7.6* 7.8*  PROT 6.9  --   BILITOT 0.5  --   ALKPHOS 181*  --   ALT 31  --   AST 60*  --   GLUCOSE 442* 327*   No results found for: CKTOTAL, CKMB, CKMBINDEX, TROPONINI No results found for: CHOL No results found for: HDL No results found for: [facility] Lab Results  Component Value Date   TRIG 105 03/19/2019   No results found for: CHOLHDL No results found for: LDLDIRECT    Radiology: [facility] Chest Port 1 View Result Date: 02/06/2024 CLINICAL DATA:  Chest pain short of breath EXAM: PORTABLE CHEST 1 VIEW COMPARISON:  11/27/2023, chest CT 01/29/2024 FINDINGS: Cardiomegaly with central vascular congestion and mild diffuse interstitial opacity likely underlying edema. Moderate left effusion and persistent dense airspace disease at the left base. Aortic atherosclerosis. IMPRESSION: Cardiomegaly with central vascular congestion and mild diffuse interstitial opacity likely underlying edema. Cardiac enlargement may be in part due to pericardial effusion which was noted on recent CT imaging. Moderate left effusion with persistent dense airspace disease at the left base. Electronically Signed   By: Luke Bun M.D.   On: 02/06/2024 18:56   CT Angio Chest Pulmonary Embolism (PE) W or WO Contrast Result Date: 01/29/2024 CLINICAL DATA:  Shortness of breath EXAM: CT ANGIOGRAPHY CHEST WITH CONTRAST TECHNIQUE: Multidetector CT imaging of the chest was performed using the standard protocol during bolus administration of intravenous contrast. Multiplanar CT image reconstructions and MIPs were obtained to evaluate the vascular anatomy. RADIATION DOSE REDUCTION: This exam was performed according to the departmental dose-optimization program which includes automated exposure control, adjustment of the mA and/or kV according to patient size and/or use of iterative reconstruction technique. CONTRAST:  75mL OMNIPAQUE   IOHEXOL  350 MG/ML SOLN COMPARISON:  CT angiogram chest 10/18/2016 FINDINGS: Cardiovascular: The heart and aorta are normal in size. There is a large pericardial effusion. There is adequate opacification of the pulmonary arteries to the segmental level. There is no evidence for pulmonary embolism. Mediastinum/Nodes: Thyroid  gland is not seen and surgical  clips are present in the thyroid  bed. There are no enlarged mediastinal or hilar lymph nodes. There are calcified right hilar lymph nodes. Visualized esophagus is within normal limits. Lungs/Pleura: There are small bilateral pleural effusions. There is smooth pleural thickening surrounding the left pleural effusion with small locules of air within the effusion on image 5/45. There is atelectasis of the right lower lobe. There is consolidation/collapse throughout the lingula and left lower lobe. Trachea and central airways are patent. Upper Abdomen: There is lobulated liver contour. The gallbladder surgically absent. The spleen is enlarged. Musculoskeletal: Postsurgical changes and scarring are noted in the left breast. There is left breast skin thickening. No acute osseous findings. There are vertebroplasty changes at T12. Review of the MIP images confirms the above findings. IMPRESSION: 1. No evidence for pulmonary embolism. 2. Large pericardial effusion. 3. Small bilateral pleural effusions. 4. Pleural thickening surrounding the left pleural effusion with small locules of air within the effusion. Findings are concerning for empyema. 5. Consolidation/collapse of the lingula and left lower lobe. 6. Right lower lobe atelectasis. 7. Findings compatible with cirrhosis and portal hypertension. Electronically Signed   By: Greig Pique M.D.   On: 01/29/2024 19:03    EKG: Sinus rhythm 86 bpm  ASSESSMENT AND PLAN:   1.  Atrial fibrillation with rapid ventricular rate, converted to sinus rhythm on IV Cardizem, now on Cardizem CD.  Started on Eliquis for stroke  prevention. 2.  Acute on chronic HFpEF, received IV furosemide , on Emblica flows and 3.  Large pericardial effusion on CT scan, in the setting of metastatic breast cancer, appears hemodynamically stable, doubt tamponade.  Order inclined not to be aggressive with regard to diagnostic and/or therapeutic pericardiocentesis.  Recommendations  1.  Agree with overall current therapy 2.  Continue Eliquis for stroke prevention 3.  Continue Cardizem CD for rate and rhythm control 4.  Continue gentle diuresis 5.  Carefully monitor renal status 6.  Review 2D echocardiogram  Signed: Marsa Dooms MD,PhD, FACC 02/07/2024, 1:44 PM

## 2024-02-07 NOTE — Assessment & Plan Note (Signed)
 Dr. Ammon states that should patient have changes in vitals, he can do pericardiocentesis Echo pending

## 2024-02-07 NOTE — Assessment & Plan Note (Signed)
 -  This complicates overall care and prognosis.

## 2024-02-07 NOTE — ED Notes (Signed)
 Applied pt on bedpan. Pt was removed from bedpan and urine went out of the bedpan and onto the bed sheets. Changed pt bed sheets and pulled pt up in the bed with assistance from RN.

## 2024-02-07 NOTE — Progress Notes (Addendum)
 PROGRESS NOTE  Rebecca Kirby  FMW:[id number] DOB: November 25, 1945 DOA: 02/06/2024 PCP: Auston Reyes BIRCH, MD   Ms. Kavita Bartl is an 78 year old female with history of hypertension, hypothyroid, anxiety, grade 1 diastolic dysfunction, history of renal cancer status post right nephrectomy, history of colon cancer status post resection, metastatic breast cancer, non-insulin -dependent diabetes mellitus type 2.  02/06/2024: Patient presented to the ED for chief concerns of worsening shortness of breath since Tuesday.  Patient was found to have atrial fibrillation with RVR and EDP started patient on diltiazem gtt.  12/11: [facility] admitted [facility] for A-fib with RVR.  Diltiazem gtt. was continued.  12/12: I assumed care of the patient.    Insulin  SSI with at bedtime coverage initiated.  Patient heart rate noted to be in the 60s and 70s for more than 12 hours.  Diltiazem gtt. discontinued.  Patient bed requirement changed to telemetry, inpatient.  Diltiazem extended release 120 mg p.o. daily initiated.  Daughter also requests palliative/hospice care referral, consult placed to palliative.  Complete echo ordered.  Family updated at bedside. Pt spoke with palliative and would like to continue full scope of care. Monitor CBC in the AM.  Assessment & Plan:   Principal Problem:   Atrial fibrillation with RVR (HCC) Active Problems:   Hyperkalemia   Obesity (BMI 30-39.9)   HTN (hypertension)   Acute on chronic diastolic CHF (congestive heart failure) (HCC)   Hypothyroidism   Anxiety and depression   Type 2 diabetes mellitus with chronic kidney disease, with long-term current use of insulin  (HCC)   Elevated troponin   Chronic anemia   Pericardial effusion   Assessment and Plan:  * Atrial fibrillation with RVR (HCC) - The patient will be admitted to a progressive unit bed. - Will continue IV Cardizem drip. - Given elevated Chads 2 VASc score, will place her on p.o.  Eliquis. - Cardiology consult to be obtained. -I notified Dr. Ammon about the patient.  02/07/2024: I assumed care of the patient.  Diltiazem gtt. has been discontinued.  Diltiazem 120 mg extended release daily initiated.  Complete echo ordered.  Noted patient on apixaban 5 mg p.o. twice daily. Dr. Ammon saw patient at bedside per daughter.   Daughter does want to continue liquids however would like to have her cbc monitored due to history of multiple AVMs requiring blood transfusion (at [facility]) when hgb <6.  Hyperkalemia Status post Lokelma  on 12/11 Resolved Check BMP in a.m.  HTN (hypertension) Home losartan  25 mg nightly.  Patient also on midodrine 2.5 mg 3 times daily with meals.  Furosemide  40 mg IV twice daily 12/12 diltiazem 120 mg daily initiated  Obesity (BMI 30-39.9) This complicates overall care and prognosis.   Acute on chronic diastolic CHF (congestive heart failure) (HCC) - The patient will be gently diuresed with IV Lasix  given borderline BP. - Will continue Jardiance . - Last 2D echo revealed diastolic dysfunction grade 1 with an EF of 70 to 75% in 2021.  proBNP noted to be > 700.   02/07/2024: Complete echo ordered.  Continue strict I's and O's.  I reviewed strict I's and O's and noted patient received 1 L of fluid on 02/06/2024.  No documented output.  Continue Lasix  40 mg IV twice daily  Hypothyroidism Home levothyroxine  150 mcg daily resume  Pericardial effusion Dr. Ammon states that should patient have changes in vitals, he can do pericardiocentesis Echo pending  Chronic anemia Secondary to history of avms. Per daughter at bedside, she  goes to [facility] for transfusion when hgb < 6.   Elevated troponin Initial value was 38 and on repeat was 39 Complete echo ordered  Type 2 diabetes mellitus with chronic kidney disease, with long-term current use of insulin  (HCC) - The patient will be placed on supplement coverage with NovoLog . - Will continue  basal coverage. - Will continue Jardiance .  02/07/2024: Home Jardiance  and glipizide  discontinued due to high risk of hypoglycemia in an inpatient status.  Continue insulin  SSI with at bedtime coverage ordered.  Anxiety and depression Home buspirone 10 mg p.o. twice daily, alprazolam  0.25 mg nightly, trazodone 25 mg nightly as needed for sleep   DVT prophylaxis: Apixaban 5 mg p.o. twice daily Code Status: full code Family Communication: daughter, Arland, who is POA and HCPOA and sil were at bedside Disposition Plan: Pending clinical course Level of care: Telemetry  Consultants:  Per admitting hospitalist, cardiology was consulted  Procedures:  no  Antimicrobials: None indicated  Subjective:  At bedside, patient able to tell me her first and last name, age, current location, current calendar year. She does not appear to be in acute distress. [place] in place.   Objective: Vitals:   02/07/24 1148 02/07/24 1333 02/07/24 1335 02/07/24 1400  BP: (!) 142/51 (!) 143/48  (!) 137/48  Pulse:  74  72  Resp:  19  20  Temp:   97.9 F (36.6 C)   TempSrc:   Oral   SpO2:  99%  100%  Weight:      Height:        Intake/Output Summary (Last 24 hours) at 02/07/2024 1631 Last data filed at 02/06/2024 2031 Gross per 24 hour  Intake 1000 ml  Output --  Net 1000 ml   Filed Weights   02/06/24 1813  Weight: 88.2 kg   Examination:  General exam: Appears calm and comfortable  Respiratory system: Clear to auscultation. Respiratory effort normal. Cardiovascular system: S1 & S2 heard, RRR. No JVD, murmurs, rubs, gallops or clicks. No pedal edema. Gastrointestinal system: obese abdomen, abdomen is nondistended, soft and nontender. No organomegaly or masses felt. Normal bowel sounds heard. Central nervous system: Alert and oriented. No focal neurological deficits. Extremities: Symmetric 5 x 5 power. Skin: left breast skin changes consistent with metastasis to breast. Left breast area is firm.  H/o left breast resection. Right breast is normal. Bilateral knee skin scare consistent with bilateral knee surgery. Psychiatry: Judgement and insight appear normal. Mood & affect appropriate.   Data Reviewed: I have personally reviewed following labs and imaging studies  CBC: Recent Labs  Lab 02/06/24 1821 02/07/24 0427  WBC 11.4* 10.3  NEUTROABS 10.4*  --   HGB 9.3* 8.6*  HCT 29.2* 27.0*  MCV 84.9 85.2  PLT 226 230   Basic Metabolic Panel: Recent Labs  Lab 02/06/24 1821 02/07/24 0427  NA 134* 135  K 5.6* 4.2  CL 96* 99  CO2 23 25  GLUCOSE 442* 327*  BUN 54* 56*  CREATININE 1.22* 1.29*  CALCIUM  7.6* 7.8*   GFR: Estimated Creatinine Clearance: 36.6 mL/min (A) (by C-G formula based on SCr of 1.29 mg/dL (H)).  Liver Function Tests: Recent Labs  Lab 02/06/24 1821  AST 60*  ALT 31  ALKPHOS 181*  BILITOT 0.5  PROT 6.9  ALBUMIN  3.3*   BNP (last 3 results) Recent Labs    02/06/24 2025  PROBNP 719.0*   CBG: Recent Labs  Lab 02/06/24 2027 02/07/24 0422 02/07/24 1330 02/07/24 1511 02/07/24 1624  GLUCAP  379* 283* 191* 169* 158*   Radiology Studies: DG Chest Port 1 View Result Date: 02/06/2024 CLINICAL DATA:  Chest pain short of breath EXAM: PORTABLE CHEST 1 VIEW COMPARISON:  11/27/2023, chest CT 01/29/2024 FINDINGS: Cardiomegaly with central vascular congestion and mild diffuse interstitial opacity likely underlying edema. Moderate left effusion and persistent dense airspace disease at the left base. Aortic atherosclerosis. IMPRESSION: Cardiomegaly with central vascular congestion and mild diffuse interstitial opacity likely underlying edema. Cardiac enlargement may be in part due to pericardial effusion which was noted on recent CT imaging. Moderate left effusion with persistent dense airspace disease at the left base. Electronically Signed   By: Luke Bun M.D.   On: 02/06/2024 18:56   Scheduled Meds:  ALPRAZolam   0.25 mg Oral QHS   apixaban  5 mg Oral  BID   busPIRone  10 mg Oral BID   calcitRIOL   0.25 mcg Oral Daily   calcium  carbonate  2,500 mg Oral TID WC   cholecalciferol   1,000 Units Oral Daily   cyanocobalamin   1,000 mcg Oral Daily   dicyclomine   10 mg Oral BID   diltiazem  120 mg Oral Daily   ferrous sulfate   325 mg Oral Daily   furosemide   40 mg Intravenous Q12H   insulin  aspart  0-5 Units Subcutaneous QHS   insulin  aspart  0-9 Units Subcutaneous TID WC   letrozole  2.5 mg Oral Daily   levothyroxine   150 mcg Oral Daily   loratadine   10 mg Oral Daily   losartan   25 mg Oral QHS   magnesium  oxide  400 mg Oral BID   midodrine  2.5 mg Oral TID WC    LOS: 1 day   Time spent: 50 minutes  Dr. Sherre [facility] If 7PM-7AM, please contact night-coverage 02/07/2024, 4:31 PM

## 2024-02-07 NOTE — Assessment & Plan Note (Addendum)
 Home levothyroxine  150 mcg daily resume

## 2024-02-07 NOTE — ED Notes (Addendum)
 Pt urinated at this time, this tech and RN changed pt bed sheets and gown at this time. Pt has pure wick placed at this time. Pt has family member and call bell by bedside.

## 2024-02-07 NOTE — Inpatient Diabetes Management (Signed)
 Inpatient Diabetes Program Recommendations  AACE/ADA: New Consensus Statement on Inpatient Glycemic Control   Target Ranges:  Prepandial:   less than 140 mg/dL      Peak postprandial:   less than 180 mg/dL (1-2 hours)      Critically ill patients:  140 - 180 mg/dL   Lab Results  Component Value Date   GLUCAP 283 (H) 02/07/2024   HGBA1C 7.4 (H) 03/19/2019    Latest Reference Range & Units 02/06/24 18:14 02/06/24 20:27 02/07/24 04:22  Glucose-Capillary 70 - 99 mg/dL 580 (H) 620 (H) 716 (H)   Review of Glycemic Control  Diabetes history: DM2  Outpatient Diabetes medications:  Tresiba  60 units daily  Humalog  1-8 units TID  Jardiance  10mg  daily   Current orders for Inpatient glycemic control:  Lantus  50 daily  Jardiance  10mg  daily   Inpatient Diabetes Program Recommendations:   Please consider ordering Novolog  0-6 units Q4HRS.   Thanks,  Lavanda Search, RN, MSN, [facility]  Inpatient Diabetes Coordinator  Pager [phone] (8a-5p)

## 2024-02-07 NOTE — Discharge Instructions (Signed)

## 2024-02-07 NOTE — Assessment & Plan Note (Addendum)
-   The patient will be gently diuresed with IV Lasix  given borderline BP. - Will continue Jardiance . - Last 2D echo revealed diastolic dysfunction grade 1 with an EF of 70 to 75% in 2021.  proBNP noted to be > 700.   02/07/2024: Complete echo ordered.  Continue strict I's and O's.  I reviewed strict I's and O's and noted patient received 1 L of fluid on 02/06/2024.  No documented output.  Continue Lasix  40 mg IV twice daily.  12/13: Discontinued Lasix  40 mg IV twice daily.  Continue strict I's and O's.  Recheck BMP in a.m.

## 2024-02-07 NOTE — Assessment & Plan Note (Addendum)
 Home buspirone  10 mg p.o. twice daily, alprazolam  0.25 mg nightly, trazodone  25 mg nightly as needed for sleep  12/13: Patient states that she takes alprazolam  0.25 mg p.o. twice daily as needed for anxiety. Alprazolam  0.25 mg p.o. twice daily as needed for anxiety resumed

## 2024-02-07 NOTE — Assessment & Plan Note (Addendum)
-   The patient will be placed on supplement coverage with NovoLog . - Will continue basal coverage. - Will continue Jardiance .  02/07/2024: Home Jardiance  and glipizide  discontinued due to high risk of hypoglycemia in an inpatient status.  Continue insulin  SSI with at bedtime coverage ordered.

## 2024-02-07 NOTE — Telephone Encounter (Signed)
 Patient Product/process development scientist completed.    The patient is insured through [facility]. Patient has Medicare and is not eligible for a copay card, but may be able to apply for patient assistance or Medicare RX Payment Plan (Patient Must reach out to their plan, if eligible for payment plan), if available.    Ran test claim for Eliquis 5 mg and the current 30 day co-pay is $0.00.   This test claim was processed through [facility]- copay amounts may vary at other pharmacies due to pharmacy/plan contracts, or as the patient moves through the different stages of their insurance plan.     Reyes Sharps, CPHT Pharmacy Technician Patient Advocate Specialist Lead [facility] Patient Advocate Team Direct Number: [phone]  Fax: [phone]

## 2024-02-07 NOTE — Hospital Course (Addendum)
 Ms. Rebecca Kirby is an 78 year old female with history of hypertension, hypothyroid, anxiety, grade 1 diastolic dysfunction, history of renal cancer status post right nephrectomy, history of colon cancer status post resection, metastatic breast cancer, non-insulin -dependent diabetes mellitus type 2.  02/06/2024: Patient presented to the ED for chief concerns of worsening shortness of breath since Tuesday.  Patient was found to have atrial fibrillation with RVR and EDP started patient on diltiazem  gtt.  12/11: Patient admitted to [facility] for A-fib with RVR.  Diltiazem  gtt. was continued.  12/12: I assumed care of the patient.    Insulin  SSI with at bedtime coverage initiated.  Patient heart rate noted to be in the 60s and 70s for more than 12 hours.  Diltiazem  gtt. discontinued.  Patient bed requirement changed to telemetry, inpatient.  Diltiazem  extended release 120 mg p.o. daily initiated.  Daughter also requests palliative/hospice care referral, consult placed to palliative.  Complete echo ordered.  Family updated at bedside. Pt spoke with palliative and would like to continue full scope of care. Monitor CBC in the AM.  12/13: Hemoglobin noted to be 9.3 however there is some white cell count elevation and elevated platelets.  Query volume contraction.  Serum creatinine is 1.52 from 1.29 yesterday.  Furosemide  IV has been discontinued.  Continue strict I's and O's.  Update: Complete echo has been read with left ventricular ejection fraction by estimation is 60 to 65%.  Left ventricular diastolic parameters are indeterminate.  Right ventricular systolic function is normal.  Right ventricular size is normal.  Moderate pericardial effusion.  No evidence of cardiac tamponade.  Aortic valve is normal in structure.  Patient also asked for sorbet of any flavor.  This has been requested in the diet order and via dietitian.   Home Xanax  0.25 mg twice daily as needed resumed.

## 2024-02-08 ENCOUNTER — Inpatient Hospital Stay
Admit: 2024-02-08 | Discharge: 2024-02-08 | Disposition: A | Discharge status: Home / Self Care | Attending: Internal Medicine | Admitting: Internal Medicine

## 2024-02-08 DIAGNOSIS — R7989 Other specified abnormal findings of blood chemistry: Secondary | ICD-10-CM

## 2024-02-08 LAB — BASIC METABOLIC PANEL WITH GFR
Anion gap: 12 (ref 5–15)
BUN: 59 mg/dL — ABNORMAL HIGH (ref 8–23)
CO2: 25 mmol/L (ref 22–32)
Calcium: 8.4 mg/dL — ABNORMAL LOW (ref 8.9–10.3)
Chloride: 100 mmol/L (ref 98–111)
Creatinine, Ser: 1.52 mg/dL — ABNORMAL HIGH (ref 0.44–1.00)
GFR, Estimated: 34 mL/min — ABNORMAL LOW (ref >60)
Glucose, Bld: 147 mg/dL — ABNORMAL HIGH (ref 70–99)
Potassium: 4.1 mmol/L (ref 3.5–5.1)
Sodium: 136 mmol/L (ref 135–145)

## 2024-02-08 LAB — ECHOCARDIOGRAM COMPLETE
AV Peak grad: 6.3 mmHg
Ao pk vel: 1.25 m/s
Area-P 1/2: 2.99 cm2
Height: 62.992 in
Weight: 3167.57 [oz_av]

## 2024-02-08 LAB — CBC
HCT: 28.7 % — ABNORMAL LOW (ref 36.0–46.0)
Hemoglobin: 9.3 g/dL — ABNORMAL LOW (ref 12.0–15.0)
MCH: 27 pg (ref 26.0–34.0)
MCHC: 32.4 g/dL (ref 30.0–36.0)
MCV: 83.2 fL (ref 80.0–100.0)
Platelets: 239 K/uL (ref 150–400)
RBC: 3.45 MIL/uL — ABNORMAL LOW (ref 3.87–5.11)
RDW: 23.4 % — ABNORMAL HIGH (ref 11.5–15.5)
WBC: 12.2 K/uL — ABNORMAL HIGH (ref 4.0–10.5)
nRBC: 0 % (ref 0.0–0.2)

## 2024-02-08 LAB — GLUCOSE, CAPILLARY
Glucose-Capillary: 147 mg/dL — ABNORMAL HIGH (ref 70–99)
Glucose-Capillary: 284 mg/dL — ABNORMAL HIGH (ref 70–99)

## 2024-02-08 LAB — HEMOGLOBIN A1C
Hgb A1c MFr Bld: 6.8 % — ABNORMAL HIGH (ref 4.8–5.6)
Mean Plasma Glucose: 148.46 mg/dL

## 2024-02-08 MED ORDER — POLYVINYL ALCOHOL 1.4 % OP SOLN: 1.0000 [drp] | Freq: Four times a day (QID) | OPHTHALMIC | Status: DC | PRN

## 2024-02-08 MED ORDER — ALPRAZOLAM 0.25 MG PO TABS
0.2500 mg | ORAL_TABLET | Freq: Two times a day (BID) | ORAL | Status: DC | PRN
  Administered 2024-02-08: 0.25 mg via ORAL
  Filled 2024-02-08: qty 1

## 2024-02-08 MED ORDER — HALOPERIDOL LACTATE 5 MG/ML IJ SOLN: 0.5000 mg | INTRAMUSCULAR | Status: DC | PRN

## 2024-02-08 MED ORDER — MORPHINE SULFATE (PF) 2 MG/ML IV SOLN
1.0000 mg | INTRAVENOUS | Status: DC | PRN
  Administered 2024-02-08 – 2024-02-09 (×2): 1 mg via INTRAVENOUS
  Filled 2024-02-08 (×2): qty 1

## 2024-02-08 MED ORDER — ACETAMINOPHEN 325 MG PO TABS: 650.0000 mg | ORAL_TABLET | Freq: Four times a day (QID) | ORAL | Status: DC | PRN

## 2024-02-08 MED ORDER — LORAZEPAM 2 MG/ML PO CONC: 1.0000 mg | ORAL | Status: DC | PRN

## 2024-02-08 MED ORDER — ENSURE PLUS HIGH PROTEIN PO LIQD: 237.0000 mL | Freq: Two times a day (BID) | ORAL | Status: DC

## 2024-02-08 MED ORDER — ORAL CARE MOUTH RINSE: 15.0000 mL | OROMUCOSAL | Status: DC | PRN

## 2024-02-08 MED ORDER — HALOPERIDOL 0.5 MG PO TABS: 0.5000 mg | ORAL_TABLET | ORAL | Status: DC | PRN

## 2024-02-08 MED ORDER — GLYCOPYRROLATE 1 MG PO TABS: 1.0000 mg | ORAL_TABLET | ORAL | Status: DC | PRN

## 2024-02-08 MED ORDER — MIDODRINE HCL 5 MG PO TABS: 5.0000 mg | ORAL_TABLET | Freq: Three times a day (TID) | ORAL | Status: DC

## 2024-02-08 MED ORDER — HALOPERIDOL LACTATE 2 MG/ML PO CONC: 0.5000 mg | ORAL | Status: DC | PRN

## 2024-02-08 MED ORDER — LORAZEPAM 2 MG/ML IJ SOLN: 0.5000 mg | INTRAMUSCULAR | Status: DC | PRN

## 2024-02-08 MED ORDER — GLYCOPYRROLATE 0.2 MG/ML IJ SOLN: 0.2000 mg | INTRAMUSCULAR | Status: DC | PRN

## 2024-02-08 MED ORDER — LORAZEPAM 1 MG PO TABS
1.0000 mg | ORAL_TABLET | ORAL | Status: DC | PRN
  Administered 2024-02-08: 1 mg via ORAL
  Filled 2024-02-08: qty 1

## 2024-02-08 MED ORDER — LORAZEPAM 2 MG/ML IJ SOLN
1.0000 mg | INTRAMUSCULAR | Status: DC | PRN
  Administered 2024-02-09 (×2): 1 mg via INTRAVENOUS
  Filled 2024-02-08 (×2): qty 1

## 2024-02-08 MED ORDER — ACETAMINOPHEN 650 MG RE SUPP: 650.0000 mg | Freq: Four times a day (QID) | RECTAL | Status: DC | PRN

## 2024-02-08 MED ORDER — BIOTENE DRY MOUTH MT LIQD: 15.0000 mL | OROMUCOSAL | Status: DC | PRN

## 2024-02-08 MED ORDER — IPRATROPIUM-ALBUTEROL 0.5-2.5 (3) MG/3ML IN SOLN
3.0000 mL | RESPIRATORY_TRACT | Status: DC | PRN
  Filled 2024-02-08: qty 3

## 2024-02-08 NOTE — Plan of Care (Signed)
  Problem: Education: Goal: Ability to describe self-care measures that may prevent or decrease complications (Diabetes Survival Skills Education) will improve Outcome: Progressing Goal: Individualized Educational Video(s) Outcome: Progressing   Problem: Coping: Goal: Ability to adjust to condition or change in health will improve Outcome: Progressing   Problem: Fluid Volume: Goal: Ability to maintain a balanced intake and output will improve Outcome: Progressing   Problem: Health Behavior/Discharge Planning: Goal: Ability to identify and utilize available resources and services will improve Outcome: Progressing Goal: Ability to manage health-related needs will improve Outcome: Progressing   Problem: Metabolic: Goal: Ability to maintain appropriate glucose levels will improve Outcome: Progressing   Problem: Nutritional: Goal: Maintenance of adequate nutrition will improve Outcome: Progressing Goal: Progress toward achieving an optimal weight will improve Outcome: Progressing   Problem: Skin Integrity: Goal: Risk for impaired skin integrity will decrease Outcome: Progressing   Problem: Tissue Perfusion: Goal: Adequacy of tissue perfusion will improve Outcome: Progressing   Problem: Education: Goal: Knowledge of General Education information will improve Description: Including pain rating scale, medication(s)/side effects and non-pharmacologic comfort measures Outcome: Progressing   Problem: Health Behavior/Discharge Planning: Goal: Ability to manage health-related needs will improve Outcome: Progressing   Problem: Clinical Measurements: Goal: Ability to maintain clinical measurements within normal limits will improve Outcome: Progressing Goal: Will remain free from infection Outcome: Progressing Goal: Diagnostic test results will improve Outcome: Progressing Goal: Respiratory complications will improve Outcome: Progressing Goal: Cardiovascular complication will  be avoided Outcome: Progressing   Problem: Activity: Goal: Risk for activity intolerance will decrease Outcome: Progressing   Problem: Nutrition: Goal: Adequate nutrition will be maintained Outcome: Progressing   Problem: Coping: Goal: Level of anxiety will decrease Outcome: Progressing   Problem: Elimination: Goal: Will not experience complications related to bowel motility Outcome: Progressing Goal: Will not experience complications related to urinary retention Outcome: Progressing   Problem: Pain Managment: Goal: General experience of comfort will improve and/or be controlled Outcome: Progressing   Problem: Safety: Goal: Ability to remain free from injury will improve Outcome: Progressing   Problem: Skin Integrity: Goal: Risk for impaired skin integrity will decrease Outcome: Progressing   Problem: Education: Goal: Ability to demonstrate management of disease process will improve Outcome: Progressing Goal: Ability to verbalize understanding of medication therapies will improve Outcome: Progressing Goal: Individualized Educational Video(s) Outcome: Progressing   Problem: Activity: Goal: Capacity to carry out activities will improve Outcome: Progressing   Problem: Cardiac: Goal: Ability to achieve and maintain adequate cardiopulmonary perfusion will improve Outcome: Progressing   Problem: Education: Goal: Knowledge of the prescribed therapeutic regimen will improve Outcome: Progressing   Problem: Coping: Goal: Ability to identify and develop effective coping behavior will improve Outcome: Progressing   Problem: Clinical Measurements: Goal: Quality of life will improve Outcome: Progressing   Problem: Respiratory: Goal: Verbalizations of increased ease of respirations will increase Outcome: Progressing   Problem: Role Relationship: Goal: Family's ability to cope with current situation will improve Outcome: Progressing Goal: Ability to verbalize  concerns, feelings, and thoughts to partner or family member will improve Outcome: Progressing   Problem: Pain Management: Goal: Satisfaction with pain management regimen will improve Outcome: Progressing

## 2024-02-08 NOTE — Assessment & Plan Note (Signed)
 Suspect secondary to overdiuresis Furosemide  40 mg IV twice daily discontinue Recheck BMP in the a.m.

## 2024-02-08 NOTE — Progress Notes (Addendum)
 Daily Progress Note   Patient Name: Rebecca Kirby       Date: 02/08/2024 DOB: August 14, 1945  Age: 78 y.o. MRN#: [id number] Attending Physician: Sherre Greig SAILOR, DO Primary Care Physician: Auston Reyes BIRCH, MD Admit Date: 02/06/2024  Reason for Consultation/Follow-up: Establishing goals of care  HPI/Brief Hospital Review: 78 y.o. female  with past medical history of colon cancer status post resection, renal cancer status post right nephrectomy, hypertension, hypothyroidism, anxiety, CHF, cirrhosis, chronic and recurrent pleural effusions found to be negative for malignancy and CKD stage III admitted from home on 02/06/2024 with progressive and worsening shortness of breath x 1 week.  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  After many consultations, current malignancy found to be inoperable and incurable at this time. Treatment is palliative focused.   Palliative medicine was consulted for assisting with goals of care conversations.  Subjective: Extensive chart review has been completed prior to meeting patient including labs, vital signs, imaging, progress notes, orders, and available advanced directive documents from current and previous encounters.    Visited with Ms. Dougher at her bedside. She is drowsy on exam today, able to open eyes and engage in conversation. HOH, hearing aids not at bedside, able to hear better out of right ear. Daughter at bedside during time of visit.  Ms. Kincannon reports feeling just ok today, unable to offer specific complaints. Reports not being able to sleep well last night due to being in a conjoined room--nursing staff assisting with  transitioning to private room. Denies acute pain or discomfort. Able to tolerate about half of breakfast this AM.  Attempted to readdress goals of care. Today we discussed code status in depth and the difference between Full Code and Do Not Resuscitate. Encouraged Ms. Derstine to consider DNR/DNI status understanding evidenced based poor outcomes in similar hospitalized patients, as the cause of the arrest is likely associated with chronic/terminal disease rather than a reversible acute cardio-pulmonary event.  Ms. Koenigsberg able to engage and able to explain her understanding of difference between Full Code and DNR. At this time, she shares she is unsure on what her wishes and desires are for herself.  Daughter shares her son--grandson to Ms. Lemley will be visiting from  out of town this evening. Family hopeful this visit with bring Ms. Hennington peace and ability to recognize severity of her multiple conditions.  Daughter inquired about access to Advanced Directive documents--shares she has provided copies on multiple hospital visits. Advanced Directive not available in [employer], reviewed media tab without success in locating AD. Daughter to have her husband bring in copy for review and scan into [employer].  Answered and addressed all questions and concerns. PMT to continue to follow for ongoing needs and support.  Addendum: Secure message from nursing staff regarding family requesting provider come to bedside to discuss goals of care with Ms. Mckinnie.  Visited at bedside with Ms. Haynes, daughter, son-in-law and grandson at bedside during time of visit.  Ms. Munar apparent shortness of breath and complaints of not being able to catch her breath.  She shares in conversation with her family she has made the decision for DNR/DNI.  She is specifically requesting medications such as morphine  and lorazepam  to ease her breathing and anxiety.  We discussed comfort care, Ms. Calico would no longer receive aggressive medical  interventions such as continuous vital signs, lab work, radiology testing, or medications not focused on comfort. All care would focus on how the patient is looking and feeling. This would include management of any symptoms that may cause discomfort, pain, shortness of breath, cough, nausea, agitation, anxiety, and/or secretions etc. Symptoms would be managed with medications and other non-pharmacological interventions such as spiritual support if requested, repositioning, music therapy, or therapeutic listening. Family verbalized understanding and appreciation. Ms. Kinn and her family verbalized understanding and all wish to proceed with full comfort measures at this time.  Orders placed to reflect comfort measures utilizing morphine , lorazepam , Haldol  and Robinul  as needed to maintain comfort.  Nursing staff and primary team made aware.  PMT will follow-up with patient and family tomorrow.    Objective:  Physical Exam Constitutional:      General: She is not in acute distress.    Appearance: She is obese. She is ill-appearing.  HENT:     Head: Normocephalic.     Mouth/Throat:     Mouth: Mucous membranes are dry.  Pulmonary:     Effort: Tachypnea and prolonged expiration present. No respiratory distress.     Comments: Mild tachypnea Abdominal:     General: There is no distension.     Palpations: Abdomen is soft.     Tenderness: There is no abdominal tenderness.  Skin:    General: Skin is warm and dry.  Neurological:     Mental Status: She is alert and oriented to person, place, and time.     Motor: Weakness present.  Psychiatric:        Mood and Affect: Mood normal.        Behavior: Behavior normal.        Thought Content: Thought content normal.             Vital Signs: BP (!) 106/46 (BP Location: Right Arm)   Pulse (!) 105   Temp (!) 97.5 F (36.4 C)   Resp 16   Ht 5' 2.99 (1.6 m)   Wt 89.8 kg   SpO2 95%   BMI 35.08 kg/m  SpO2: SpO2: 95 % O2 Device: O2 Device: Nasal  Cannula O2 Flow Rate: O2 Flow Rate (L/min): 3 L/min   Palliative Care Assessment & Plan   Assessment/Recommendation/Plan  Continue with current plan of care Ongoing GOC conversations to be had On return to bedside  later in day, decision made by patient and family to transition to DNR/DNI/Comfort--orders placed to reflect comfort measures  Thank you for allowing the Palliative Medicine Team to assist in the care of this patient.  Time spent includes: Detailed review of medical records (labs, imaging, vital signs), medically appropriate exam (mental status, respiratory, cardiac, skin), discussed with treatment team, counseling and educating patient, family and staff, documenting clinical information, medication management and coordination of care.  Waddell Lesches, DNP, AGNP-C Palliative Medicine   Please contact Palliative Medicine Team phone at [phone] for questions and concerns.

## 2024-02-08 NOTE — Progress Notes (Signed)
°  Echocardiogram 2D Echocardiogram has been performed.  Thedora GORMAN Louder 02/08/2024, 8:37 AM

## 2024-02-08 NOTE — Progress Notes (Addendum)
 PROGRESS NOTE  THETA LEAF  FMW:[id number] DOB: 1945/11/04 DOA: 02/06/2024 PCP: Auston Reyes BIRCH, MD   Ms. Rebecca Kirby is an 78 year old female with history of hypertension, hypothyroid, anxiety, grade 1 diastolic dysfunction, history of renal cancer status post right nephrectomy, history of colon cancer status post resection, metastatic breast cancer, non-insulin -dependent diabetes mellitus type 2.  02/06/2024: Patient presented to the ED for chief concerns of worsening shortness of breath since Tuesday.  Patient was found to have atrial fibrillation with RVR and EDP started patient on diltiazem  gtt.  12/11: [facility] for A-fib with RVR.  Diltiazem  gtt. was continued.  12/12: I assumed care of the patient.    Insulin  SSI with at bedtime coverage initiated.  Patient heart rate noted to be in the 60s and 70s for more than 12 hours.  Diltiazem  gtt. discontinued.  Patient bed requirement changed to telemetry, inpatient.  Diltiazem  extended release 120 mg p.o. daily initiated.  Daughter also requests palliative/hospice care referral, consult placed to palliative.  Complete echo ordered.  Family updated at bedside. Pt spoke with palliative and would like to continue full scope of care. Monitor CBC in the AM.  12/13: Hemoglobin noted to be 9.3 however there is some white cell count elevation and elevated platelets.  Query volume contraction.  Serum creatinine is 1.52 from 1.29 yesterday.  Furosemide  IV has been discontinued.  Continue strict I's and O's.  Update: Complete echo has been read with left ventricular ejection fraction by estimation is 60 to 65%.  Left ventricular diastolic parameters are indeterminate.  Right ventricular systolic function is normal.  Right ventricular size is normal.  Moderate pericardial effusion.  No evidence of cardiac tamponade.  Aortic valve is normal in structure.  Patient also asked for sorbet of any flavor.  This has  been requested in the diet order and via dietitian.  Assessment & Plan:   Principal Problem:   Atrial fibrillation with RVR (HCC) Active Problems:   Acute on chronic diastolic CHF (congestive heart failure) (HCC)   Elevated serum creatinine   Elevated troponin   Chronic anemia   Pericardial effusion   Hypothyroidism   Obesity (BMI 30-39.9)   HTN (hypertension)   Anxiety and depression   Type 2 diabetes mellitus with chronic kidney disease, with long-term current use of insulin  (HCC)   Hyperkalemia    Assessment and Plan: * Atrial fibrillation with RVR (HCC) - The patient will be admitted to a progressive unit bed. - Will continue IV Cardizem  drip. - Given elevated Chads 2 VASc score, will place her on p.o. Eliquis . - Cardiology consult to be obtained. -I notified Dr. Ammon about the patient.  02/07/2024: I assumed care of the patient.  Diltiazem  gtt. has been discontinued.  Diltiazem  120 mg extended release daily initiated.  Complete echo ordered.  Noted patient on apixaban  5 mg p.o. twice daily. Dr. Ammon saw patient at bedside per daughter.   Daughter does want to continue liquids however would like to have her cbc monitored due to history of multiple AVMs requiring blood transfusion (at [facility]) when hgb <6. 12/13: Hemoglobin is 9.3.  No signs of active bleeding.  Continue Eliquis  and monitor  Elevated serum creatinine Suspect secondary to overdiuresis Furosemide  40 mg IV twice daily discontinue Recheck BMP in the a.m.  Acute on chronic diastolic CHF (congestive heart failure) (HCC) - The patient will be gently diuresed with IV Lasix  given borderline BP. - Will continue Jardiance . - Last 2D echo  revealed diastolic dysfunction grade 1 with an EF of 70 to 75% in 2021.  proBNP noted to be > 700.   02/07/2024: Complete echo ordered.  Continue strict I's and O's.  I reviewed strict I's and O's and noted patient received 1 L of fluid on 02/06/2024.  No documented  output.  Continue Lasix  40 mg IV twice daily.  12/13: Discontinued Lasix  40 mg IV twice daily.  Continue strict I's and O's.  Recheck BMP in a.m.  Pericardial effusion Dr. Ammon states that should patient have changes in vitals, he can do pericardiocentesis 12/13: Echo: Read as moderate pericardial effusion without evidence of tamponade.  Chronic anemia Secondary to history of avms. Per daughter at bedside, she goes to [facility] for transfusion when hgb < 6.  12/13: Hemoglobin is 9.3 today. Recheck CBC in the a.m.  Elevated troponin Initial value was 38 and on repeat was 39 12/13: Patient has no chest pain or shortness of breath at this time  Hyperkalemia Status post Lokelma  on 12/11 Resolved Check BMP in a.m.  Type 2 diabetes mellitus with chronic kidney disease, with long-term current use of insulin  (HCC) - The patient will be placed on supplement coverage with NovoLog . - Will continue basal coverage. - Will continue Jardiance .  02/07/2024: Home Jardiance  and glipizide  discontinued due to high risk of hypoglycemia in an inpatient status.  Continue insulin  SSI with at bedtime coverage ordered.  Anxiety and depression Home buspirone  10 mg p.o. twice daily, alprazolam  0.25 mg nightly, trazodone  25 mg nightly as needed for sleep  12/13: Patient states that she takes alprazolam  0.25 mg p.o. twice daily as needed for anxiety. Alprazolam  0.25 mg p.o. twice daily as needed for anxiety resumed  HTN (hypertension) Home losartan  25 mg nightly.  Patient also on midodrine  2.5 mg 3 times daily with meals.  Furosemide  40 mg IV twice daily 12/12 diltiazem  120 mg daily initiated  Obesity (BMI 30-39.9) This complicates overall care and prognosis.   Hypothyroidism Home levothyroxine  150 mcg daily resume  DVT prophylaxis: Eliquis  5 mg p.o. twice daily Code Status: Full code Family Communication: Daughter, Arland at bedside has been updated Disposition Plan: Pending clinical course,  improvement of renal function. Level of care: Telemetry Diet: Heart healthy/carb modified.  I added that patient can have sorbet of any flavor with her meals.  Consultants:  Cardiology  Procedures:  None  Antimicrobials: None indicated at this time  Subjective:  At bedside, patient awake alert and oriented to self, age, location, current calendar year.  Patient states that they moved her around midnight and so disturbed her sleep and then when she arrived to the room, she had a terrible roommate.  She reports she did not sleep well last night because her roommate yelled all night.    Patient requests sorbet if any flavor. Objective: Vitals:   02/08/24 0431 02/08/24 0433 02/08/24 0807 02/08/24 1226  BP: (!) 77/59 (!) 105/59 (!) 131/48 (!) 106/46  Pulse: (!) 123 (!) 135  (!) 105  Resp: (!) 22 19 18 16   Temp: 97.8 F (36.6 C)  (!) 97.5 F (36.4 C)   TempSrc: Oral     SpO2: 96% 95% 94% 95%  Weight:      Height:        Intake/Output Summary (Last 24 hours) at 02/08/2024 1508 Last data filed at 02/08/2024 1023 Gross per 24 hour  Intake 268.17 ml  Output --  Net 268.17 ml   Filed Weights   02/06/24 1813 02/08/24  0143  Weight: 88.2 kg 89.8 kg   Examination:  General exam: Appears calm and comfortable  Respiratory system: Clear to auscultation. Respiratory effort normal. Cardiovascular system: S1 & S2 heard, RRR. No JVD, murmurs, rubs, gallops or clicks. No pedal edema. Gastrointestinal system: Abdomen is nondistended, soft and nontender. No organomegaly or masses felt. Normal bowel sounds heard. Central nervous system: Alert and oriented. No focal neurological deficits. Extremities: Symmetric 5 x 5 power. Skin: No rashes, lesions or ulcers Psychiatry: Judgement and insight appear normal. Mood & affect appropriate.   Data Reviewed: I have personally reviewed following labs and imaging studies  CBC: Recent Labs  Lab 02/06/24 1821 02/07/24 0427 02/08/24 0400  WBC  11.4* 10.3 12.2*  NEUTROABS 10.4*  --   --   HGB 9.3* 8.6* 9.3*  HCT 29.2* 27.0* 28.7*  MCV 84.9 85.2 83.2  PLT 226 230 239   Basic Metabolic Panel: Recent Labs  Lab 02/06/24 1821 02/07/24 0427 02/08/24 0400  NA 134* 135 136  K 5.6* 4.2 4.1  CL 96* 99 100  CO2 23 25 25   GLUCOSE 442* 327* 147*  BUN 54* 56* 59*  CREATININE 1.22* 1.29* 1.52*  CALCIUM  7.6* 7.8* 8.4*   GFR: Estimated Creatinine Clearance: 31.4 mL/min (A) (by C-G formula based on SCr of 1.52 mg/dL (H)).  Liver Function Tests: Recent Labs  Lab 02/06/24 1821  AST 60*  ALT 31  ALKPHOS 181*  BILITOT 0.5  PROT 6.9  ALBUMIN  3.3*   BNP (last 3 results) Recent Labs    02/06/24 2025  PROBNP 719.0*   HbA1C: Recent Labs    02/08/24 0400  HGBA1C 6.8*   CBG: Recent Labs  Lab 02/07/24 1511 02/07/24 1624 02/07/24 2137 02/08/24 0759 02/08/24 1201  GLUCAP 169* 158* 255* 147* 284*   Radiology Studies: ECHOCARDIOGRAM COMPLETE Result Date: 02/08/2024    ECHOCARDIOGRAM REPORT   Patient Name:   Rebecca Kirby Date of Exam: 02/08/2024 Medical Rec #:  [id number]       Height:       63.0 in Accession #:    [id number]      Weight:       198.0 lb Date of Birth:  07/28/1945       BSA:          1.925 m Patient Age:    78 years        BP:           105/59 mmHg Patient Gender: F               HR:           112 bpm. Exam Location:  [facility] Procedure: 2D Echo, Color Doppler and Cardiac Doppler (Both Spectral and Color            Flow Doppler were utilized during procedure). Indications:     Atrial Fibrillation I48.91  History:         Patient has prior history of Echocardiogram examinations, most                  recent 03/20/2019.  Sonographer:     Thedora Louder RDCS, FASE Referring Phys:  [id number] Keidy Thurgood N Jasman Pfeifle Diagnosing Phys: Marsa Dooms MD  Sonographer Comments: Technically challenging study due to limited acoustic windows, no parasternal window, suboptimal apical window and suboptimal subcostal window. Image acquisition  challenging due to respiratory motion. The patient has a history of breast cancer making image acquisition very difficult due to breast tenderness.  IMPRESSIONS  1. Left ventricular ejection fraction, by estimation, is 60 to 65%. The left ventricle has normal function. The left ventricle has no regional wall motion abnormalities. Left ventricular diastolic parameters are indeterminate.  2. Right ventricular systolic function is normal. The right ventricular size is normal.  3. Moderate pericardial effusion. There is no evidence of cardiac tamponade.  4. The mitral valve is normal in structure. Trivial mitral valve regurgitation. No evidence of mitral stenosis.  5. The aortic valve is normal in structure. Aortic valve regurgitation is not visualized. No aortic stenosis is present.  6. The inferior vena cava is normal in size with greater than 50% respiratory variability, suggesting right atrial pressure of 3 mmHg. FINDINGS  Left Ventricle: Left ventricular ejection fraction, by estimation, is 60 to 65%. The left ventricle has normal function. The left ventricle has no regional wall motion abnormalities. Strain was performed and the global longitudinal strain is indeterminate. The left ventricular internal cavity size was normal in size. There is no left ventricular hypertrophy. Left ventricular diastolic parameters are indeterminate. Right Ventricle: The right ventricular size is normal. No increase in right ventricular wall thickness. Right ventricular systolic function is normal. Left Atrium: Left atrial size was normal in size. Right Atrium: Right atrial size was normal in size. Pericardium: A moderately sized pericardial effusion is present. There is no evidence of cardiac tamponade. Mitral Valve: The mitral valve is normal in structure. Trivial mitral valve regurgitation. No evidence of mitral valve stenosis. Tricuspid Valve: The tricuspid valve is normal in structure. Tricuspid valve regurgitation is trivial. No  evidence of tricuspid stenosis. Aortic Valve: The aortic valve is normal in structure. Aortic valve regurgitation is not visualized. No aortic stenosis is present. Aortic valve peak gradient measures 6.2 mmHg. Pulmonic Valve: The pulmonic valve was normal in structure. Pulmonic valve regurgitation is not visualized. No evidence of pulmonic stenosis. Aorta: The aortic root is normal in size and structure. Venous: The inferior vena cava is normal in size with greater than 50% respiratory variability, suggesting right atrial pressure of 3 mmHg. IAS/Shunts: No atrial level shunt detected by color flow Doppler. Additional Comments: 3D was performed not requiring image post processing on an independent workstation and was indeterminate.  AORTIC VALVE AV Vmax:      125.00 cm/s AV Peak Grad: 6.2 mmHg MITRAL VALVE MV Area (PHT): 2.99 cm MV Decel Time: 254 msec MV E velocity: 69.70 cm/s Marsa Dooms MD Electronically signed by Marsa Dooms MD Signature Date/Time: 02/08/2024/10:36:02 AM    Final    DG Chest Port 1 View Result Date: 02/06/2024 CLINICAL DATA:  Chest pain short of breath EXAM: PORTABLE CHEST 1 VIEW COMPARISON:  11/27/2023, chest CT 01/29/2024 FINDINGS: Cardiomegaly with central vascular congestion and mild diffuse interstitial opacity likely underlying edema. Moderate left effusion and persistent dense airspace disease at the left base. Aortic atherosclerosis. IMPRESSION: Cardiomegaly with central vascular congestion and mild diffuse interstitial opacity likely underlying edema. Cardiac enlargement may be in part due to pericardial effusion which was noted on recent CT imaging. Moderate left effusion with persistent dense airspace disease at the left base. Electronically Signed   By: Luke Bun M.D.   On: 02/06/2024 18:56   Scheduled Meds:  apixaban   5 mg Oral BID   busPIRone   10 mg Oral BID   calcitRIOL   0.25 mcg Oral Daily   calcium  carbonate  2,500 mg Oral TID WC   cholecalciferol    1,000 Units Oral Daily   cyanocobalamin   1,000 mcg Oral  Daily   dicyclomine   10 mg Oral BID   diltiazem   120 mg Oral Daily   [START ON 02/09/2024] feeding supplement  237 mL Oral BID BM   ferrous sulfate   325 mg Oral Daily   insulin  aspart  0-5 Units Subcutaneous QHS   insulin  aspart  0-9 Units Subcutaneous TID WC   letrozole   2.5 mg Oral Daily   levothyroxine   150 mcg Oral Daily   loratadine   10 mg Oral Daily   losartan   25 mg Oral QHS   magnesium  oxide  400 mg Oral BID   midodrine   2.5 mg Oral TID WC    LOS: 2 days   Time spent: 50 minutes  Dr. Sherre [facility] If 7PM-7AM, please contact night-coverage 02/08/2024, 3:08 PM

## 2024-02-08 NOTE — Progress Notes (Signed)
 [facility] Cardiology  SUBJECTIVE: Patient sitting up in bed, eating breakfast   Vitals:   02/08/24 0322 02/08/24 0431 02/08/24 0433 02/08/24 0807  BP: (!) 88/43 (!) 77/59 (!) 105/59 (!) 131/48  Pulse: 82 (!) 123 (!) 135   Resp: 18 (!) 22 19 18   Temp: (!) 97.5 F (36.4 C) 97.8 F (36.6 C)  (!) 97.5 F (36.4 C)  TempSrc:  Oral    SpO2: 96% 96% 95% 94%  Weight:      Height:         Intake/Output Summary (Last 24 hours) at 02/08/2024 9073 Last data filed at 02/08/2024 0500 Gross per 24 hour  Intake 88.17 ml  Output --  Net 88.17 ml      PHYSICAL EXAM  General: Well developed, well nourished, in no acute distress HEENT:  Normocephalic and atramatic Neck:  No JVD.  Lungs: Clear bilaterally to auscultation and percussion. Heart: HRRR . Normal S1 and S2 without gallops or murmurs.  Abdomen: Bowel sounds are positive, abdomen soft and non-tender  Msk:  Back normal, normal gait. Normal strength and tone for age. Extremities: No clubbing, cyanosis or edema.   Neuro: Alert and oriented X 3. Psych:  Good affect, responds appropriately   LABS: Basic Metabolic Panel: Recent Labs    02/07/24 0427 02/08/24 0400  NA 135 136  K 4.2 4.1  CL 99 100  CO2 25 25  GLUCOSE 327* 147*  BUN 56* 59*  CREATININE 1.29* 1.52*  CALCIUM  7.8* 8.4*   Liver Function Tests: Recent Labs    02/06/24 1821  AST 60*  ALT 31  ALKPHOS 181*  BILITOT 0.5  PROT 6.9  ALBUMIN  3.3*   No results for input(s): LIPASE, AMYLASE in the last 72 hours. CBC: Recent Labs    02/06/24 1821 02/07/24 0427 02/08/24 0400  WBC 11.4* 10.3 12.2*  NEUTROABS 10.4*  --   --   HGB 9.3* 8.6* 9.3*  HCT 29.2* 27.0* 28.7*  MCV 84.9 85.2 83.2  PLT 226 230 239   Cardiac Enzymes: No results for input(s): CKTOTAL, CKMB, CKMBINDEX, TROPONINI in the last 72 hours. BNP: Invalid input(s): POCBNP D-Dimer: No results for input(s): DDIMER in the last 72 hours. Hemoglobin A1C: No results for input(s):  HGBA1C in the last 72 hours. Fasting Lipid Panel: No results for input(s): CHOL, HDL, LDLCALC, TRIG, CHOLHDL, LDLDIRECT in the last 72 hours. Thyroid  Function Tests: No results for input(s): TSH, T4TOTAL, T3FREE, THYROIDAB in the last 72 hours.  Invalid input(s): FREET3 Anemia Panel: No results for input(s): VITAMINB12, FOLATE, FERRITIN, TIBC, IRON , RETICCTPCT in the last 72 hours.  DG Chest Port 1 View Result Date: 02/06/2024 CLINICAL DATA:  Chest pain short of breath EXAM: PORTABLE CHEST 1 VIEW COMPARISON:  11/27/2023, chest CT 01/29/2024 FINDINGS: Cardiomegaly with central vascular congestion and mild diffuse interstitial opacity likely underlying edema. Moderate left effusion and persistent dense airspace disease at the left base. Aortic atherosclerosis. IMPRESSION: Cardiomegaly with central vascular congestion and mild diffuse interstitial opacity likely underlying edema. Cardiac enlargement may be in part due to pericardial effusion which was noted on recent CT imaging. Moderate left effusion with persistent dense airspace disease at the left base. Electronically Signed   By: Luke Bun M.D.   On: 02/06/2024 18:56     Echo pending  TELEMETRY: Sinus rhythm 72 bpm:  ASSESSMENT AND PLAN:  Principal Problem:   Atrial fibrillation with RVR (HCC) Active Problems:   Hypothyroidism   Obesity (BMI 30-39.9)   HTN (hypertension)  Acute on chronic diastolic CHF (congestive heart failure) (HCC)   Anxiety and depression   Type 2 diabetes mellitus with chronic kidney disease, with long-term current use of insulin  (HCC)   Hyperkalemia   Elevated troponin   Chronic anemia   Pericardial effusion   Elevated serum creatinine    1.  Atrial fibrillation with rapid ventricular rate, converted to sinus rhythm on IV Cardizem , now on Cardizem  CD.  Started on Eliquis  for stroke prevention. 2.  Acute on chronic HFpEF, received IV furosemide , on Emblica flows  and 3.  Large pericardial effusion on CT scan, in the setting of metastatic breast cancer, appears hemodynamically stable, doubt tamponade.  Patient and daughter not inclined to be aggressive with regard to diagnostic and/or therapeutic pericardiocentesis.   Recommendations   1.  Agree with overall current therapy 2.  Continue Eliquis  for stroke prevention 3.  Continue Cardizem  CD for rate and rhythm control 4.  Continue gentle diuresis 5.  Carefully monitor renal status 6.  Review 2D echocardiogram   Marsa Dooms, MD, PhD, FACC 02/08/2024 9:26 AM

## 2024-02-09 DIAGNOSIS — R627 Adult failure to thrive: Secondary | ICD-10-CM

## 2024-02-09 MED ORDER — MORPHINE SULFATE (PF) 2 MG/ML IV SOLN: 1.0000 mg | INTRAVENOUS | Status: DC | PRN

## 2024-02-09 NOTE — Plan of Care (Signed)
  Problem: Education: Goal: Ability to describe self-care measures that may prevent or decrease complications (Diabetes Survival Skills Education) will improve Outcome: Progressing Goal: Individualized Educational Video(s) Outcome: Progressing   Problem: Coping: Goal: Ability to adjust to condition or change in health will improve Outcome: Progressing   Problem: Fluid Volume: Goal: Ability to maintain a balanced intake and output will improve Outcome: Progressing   Problem: Health Behavior/Discharge Planning: Goal: Ability to identify and utilize available resources and services will improve Outcome: Progressing Goal: Ability to manage health-related needs will improve Outcome: Progressing   Problem: Metabolic: Goal: Ability to maintain appropriate glucose levels will improve Outcome: Progressing   Problem: Nutritional: Goal: Maintenance of adequate nutrition will improve Outcome: Progressing Goal: Progress toward achieving an optimal weight will improve Outcome: Progressing   Problem: Skin Integrity: Goal: Risk for impaired skin integrity will decrease Outcome: Progressing   Problem: Tissue Perfusion: Goal: Adequacy of tissue perfusion will improve Outcome: Progressing   Problem: Education: Goal: Knowledge of General Education information will improve Description: Including pain rating scale, medication(s)/side effects and non-pharmacologic comfort measures Outcome: Progressing   Problem: Health Behavior/Discharge Planning: Goal: Ability to manage health-related needs will improve Outcome: Progressing   Problem: Clinical Measurements: Goal: Ability to maintain clinical measurements within normal limits will improve Outcome: Progressing Goal: Will remain free from infection Outcome: Progressing Goal: Diagnostic test results will improve Outcome: Progressing Goal: Respiratory complications will improve Outcome: Progressing Goal: Cardiovascular complication will  be avoided Outcome: Progressing   Problem: Activity: Goal: Risk for activity intolerance will decrease Outcome: Progressing   Problem: Nutrition: Goal: Adequate nutrition will be maintained Outcome: Progressing   Problem: Coping: Goal: Level of anxiety will decrease Outcome: Progressing   Problem: Elimination: Goal: Will not experience complications related to bowel motility Outcome: Progressing Goal: Will not experience complications related to urinary retention Outcome: Progressing   Problem: Pain Managment: Goal: General experience of comfort will improve and/or be controlled Outcome: Progressing   Problem: Safety: Goal: Ability to remain free from injury will improve Outcome: Progressing   Problem: Skin Integrity: Goal: Risk for impaired skin integrity will decrease Outcome: Progressing   Problem: Education: Goal: Ability to demonstrate management of disease process will improve Outcome: Progressing Goal: Ability to verbalize understanding of medication therapies will improve Outcome: Progressing Goal: Individualized Educational Video(s) Outcome: Progressing   Problem: Activity: Goal: Capacity to carry out activities will improve Outcome: Progressing   Problem: Cardiac: Goal: Ability to achieve and maintain adequate cardiopulmonary perfusion will improve Outcome: Progressing   Problem: Education: Goal: Knowledge of the prescribed therapeutic regimen will improve Outcome: Progressing   Problem: Coping: Goal: Ability to identify and develop effective coping behavior will improve Outcome: Progressing   Problem: Clinical Measurements: Goal: Quality of life will improve Outcome: Progressing   Problem: Respiratory: Goal: Verbalizations of increased ease of respirations will increase Outcome: Progressing   Problem: Role Relationship: Goal: Family's ability to cope with current situation will improve Outcome: Progressing Goal: Ability to verbalize  concerns, feelings, and thoughts to partner or family member will improve Outcome: Progressing   Problem: Pain Management: Goal: Satisfaction with pain management regimen will improve Outcome: Progressing

## 2024-02-09 NOTE — TOC Transition Note (Signed)
 Transition of Care [facility]) - Discharge Note   Patient Details  Name: Rebecca Kirby MRN: [id number] Date of Birth: [birth year]  Transition [facility]) CM/SW Contact:  Victory Jackquline RAMAN, RN Phone Number: 02/09/2024, 3:04 PM   Clinical Narrative:    Pt discharging to [facility] via [facility].Transportation set up by [facility]. Nurse to call report to [phone].No further concerns. RNCM signing off.     Final next level of care: Hospice Medical Facility Barriers to Discharge: Barriers Resolved   Patient Goals and CMS Choice            Discharge Placement                Patient to be transferred to facility by: [facility] Name of family member notified: Arland Patient and family notified of of transfer: 02/09/24  Discharge Plan and Services Additional resources added to the After Visit Summary for                                       Social Drivers of Health (SDOH) Interventions SDOH Screenings   Food Insecurity: Patient Declined (02/08/2024)  Housing: Patient Declined (02/08/2024)  Transportation Needs: Patient Declined (02/08/2024)  Utilities: Patient Declined (02/08/2024)  Depression (PHQ2-9): Low Risk (01/14/2023)  Financial Resource Strain: Low Risk  (11/28/2023)   Received from [facility]  Social Connections: Patient Declined (02/08/2024)  Tobacco Use: Medium Risk (02/07/2024)     Readmission Risk Interventions    10/18/2023    3:56 PM  Readmission Risk Prevention Plan  Transportation Screening Complete  Medication Review (RN Care Manager) Complete  PCP or Specialist appointment within 3-5 days of discharge Complete  HRI or Home Care Consult Complete  SW Recovery Care/Counseling Consult Complete  Palliative Care Screening Not Applicable  Skilled Nursing Facility Not Applicable

## 2024-02-09 NOTE — Progress Notes (Signed)
 [facility] Liaison Note  Follow on new hospice referral from Dr. Ophelia Sage prior to patient's hospitalization.  Joint visit made with Waddell Lesches, NP/PMT.  Met with patient's daughter, Arland, patient's Darden and another family member at the bedside to discuss [facility]).  Education related to hospice philosophy, comfort approach and criteria for IPU initiated.  They confirm their desire to proceed with IPU evaluation.  Patient has been approved for IPU- GIP level of care by Dr. Clayton, hospice MD.  Family accepted bed offer.  [facility] RN to call report to the Hospice Home at [phone]  This RN will arrange EMS transportation when the hospital team is ready for discharge.  Please medicate the patient prior to EMS transport as needed for comfort during transport.  Please send signed and completed DNR home with patient/family if applicable.   Above information shared with Jackquline Shove, CM and hospital medical care team.  Please call with any hospice related questions or concerns.  Thank you for the opportunity to participate in this patients care.  [name], MA, BSN, RN, FNE Nurse Liaison [phone]

## 2024-02-09 NOTE — Progress Notes (Signed)
 [facility] Liaison Note  Ms. Rebecca Kirby is a new hospice referral [facility] received from patient's PCP prior to hospitalization.  Notified hospital medical team and [name], CM of same.    Patient made full comfort measures today. Hospital liaison team will follow patient through final discharge plan.  [name], RN Nurse Liaison [phone]

## 2024-02-09 NOTE — Discharge Summary (Signed)
 Physician Discharge Summary  Rebecca Kirby FMW:[id number] DOB: [birth year] DOA: 02/06/2024  PCP: Auston Reyes BIRCH, MD  Admit date: 02/06/2024 Discharge date: 02/09/2024  Admitted From: home  Disposition:  inpatient hospice facility   Recommendations for Outpatient Follow-up:  Follow up with hospice provider ASAP   Home Health: no  Equipment/Devices:  Discharge Condition: hospice CODE STATUS:DNR Diet recommendation: regular- as tolerated  Brief/Interim Summary: HPI was taken from Dr. Lawence: Rebecca Kirby is a 78 y.o. Caucasian female with medical history significant for essential hypertension, hypothyroidism, anxiety, grade 1 diastolic dysfunction, history of renal cancer status post right nephrectomy, history of colon cancer status post resection, metastatic breast cancer, and type 2 diabetes mellitus, who presented to the emergency room with onset of worsening dyspnea that started since Tuesday with associated palpitations, after leaving the oncology center where she has been following for stage IV metastatic breast cancer.  She has been fairly anxious when she was seen by her PCP on Thursday.  Per her daughter EKG done was normal.  She had a chest CT that showed large pericardial effusion per her report.  Her Lasix  was increased to 20 mg p.o. twice daily and she was given prednisone 20 mg p.o. daily.  Her antianxiety medication was increased.  Her potassium was 6.2 and she was given Kayexalate  for 3 days.  She denied any fever or chills.  No nausea or vomiting or abdominal pain.  Today she has been feeling very weak.  She has not been out of her bed.  She did not take her insulin .  Her neighbor called her daughter to come and see her when she brought her to the ER.  She has a history of right nephrectomy for renal cancer and 2008 and had a history of colon cancer as well status post resection.  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   ED Course:  When she came to the ER, BP was 137/113 with heart rate of 146 and a respiratory rate of 26 with pulse oximetry of 97% on 2 L of O2 by nasal cannula.  Labs revealed VBG with pH 7.43 and HCO3 of 26.5 and proBNP of 719 and high sensitive troponin of 38 and later 39 potassium was 5.6 and sodium 134 with chloride of 96 and glucose 442.  BUN was 54 and creatinine 1.22 with calcium  7.6.  Alk phos was 181 and albumin  3.3 with AST of 60.  CBC showed hemoglobin of 9.3 hematocrit 29.2 better than previous levels with WBCs of 11.4 with neutrophilia.  UA showed more than 500 glucose and was otherwise negative. EKG as reviewed by me : EKG showed atrial fibrillation with rapid ventricular response of 144 Imaging: Portable chest x-ray showed the following: Cardiomegaly with central vascular congestion and mild diffuse interstitial opacity likely underlying edema. Cardiac enlargement may be in part due to pericardial effusion which was noted on recent CT imaging. Moderate left effusion with persistent dense airspace disease at the left base.   Chest CTA revealed the following: 1. No evidence for pulmonary embolism. 2. Large pericardial effusion. 3. Small bilateral pleural effusions. 4. Pleural thickening surrounding the left pleural effusion with small locules of air within the effusion. Findings are concerning for empyema. 5. Consolidation/collapse of the lingula and left lower lobe. 6. Right lower lobe atelectasis. 7. Findings compatible with cirrhosis and portal hypertension.   The patient was given 10 g of p.o. Lokelma  and 1 L bolus of IV  normal saline, 0.5 mg of IV Ativan  and IV Cardizem  bolus of 10 mg followed by IV Cardizem  drip.  She will be admitted to a progressive unit bed for further evaluation and management. Discharge Diagnoses:  Principal Problem:   Atrial fibrillation with RVR (HCC) Active Problems:   Acute on chronic diastolic CHF (congestive heart failure) (HCC)   Elevated serum  creatinine   Elevated troponin   Chronic anemia   Pericardial effusion   Hypothyroidism   Obesity (BMI 30-39.9)   HTN (hypertension)   Anxiety and depression   Type 2 diabetes mellitus with chronic kidney disease, with long-term current use of insulin  (HCC)   Hyperkalemia  Failure to thrive: secondary to all below. Continue w/ comfort care only.   A. fib: w/ RVR. D/c cardizem  drip and eliquis . Comfort care only. Cardio recs apprec    Elevated serum creatinine: trending up. Comfort care only    Acute on chronic diastolic CHF: echo revealed diastolic dysfunction grade 1 with an EF of 70 to 75% in 2021.Comfort care only    Pericardial effusion: comfort care only   Normocytic anemia: secondary to hx of AVMs. Comfort care only .   Elevated troponin: minimal elevation. Comfort care only    Hyperkalemia: WNL yesterday    DM2: well controlled, HbA1c 6.8. Comfort care only    Depression: severity unknown. Continue on home dose of sertraline  Anxiety: severity unknown. Continue on home dose of xanax    HTN: comfort care only    Obesity: BMI 35.0. Would benefit from weight loss. Comfort care only .    Hypothyroidism: comfort care only   Discharge Instructions  Discharge Instructions     Diet general   Complete by: As directed    As tolerated   Discharge instructions   Complete by: As directed    F/u w/ hospice provider as soon as possible   Increase activity slowly   Complete by: As directed       Allergies as of 02/09/2024       Reactions   Statins    Cirrhosis, unable to tolerate        Medication List     STOP taking these medications    Accu-Chek FastClix Lancets Misc   alendronate 70 MG tablet Commonly known as: FOSAMAX   ARANESP  IJ   B-D UF III MINI PEN NEEDLES 31G X 5 MM Misc Generic drug: Insulin  Pen Needle   busPIRone  10 MG tablet Commonly known as: BUSPAR    calcitRIOL  0.25 MCG capsule Commonly known as: ROCALTROL    calcium  acetate 667  MG capsule Commonly known as: PHOSLO    calcium  carbonate 1250 (500 Ca) MG tablet Commonly known as: OS-CAL - dosed in mg of elemental calcium    cyanocobalamin  1000 MCG tablet Commonly known as: VITAMIN B12   cyclobenzaprine 5 MG tablet Commonly known as: FLEXERIL   dicyclomine  10 MG capsule Commonly known as: BENTYL    DULoxetine 30 MG capsule Commonly known as: CYMBALTA   empagliflozin  10 MG Tabs tablet Commonly known as: JARDIANCE    ferrous sulfate  325 (65 FE) MG tablet   fexofenadine 180 MG tablet Commonly known as: ALLEGRA   fluticasone  50 MCG/ACT nasal spray Commonly known as: FLONASE    furosemide  40 MG tablet Commonly known as: LASIX    HumaLOG  KwikPen 100 UNIT/ML KwikPen Generic drug: insulin  lispro   HYDROcodone -acetaminophen  10-325 MG tablet Commonly known as: NORCO   letrozole  2.5 MG tablet Commonly known as: FEMARA    levothyroxine  150 MCG tablet Commonly known as:  SYNTHROID    levothyroxine  200 MCG tablet Commonly known as: SYNTHROID    losartan  25 MG tablet Commonly known as: COZAAR    magnesium  oxide 400 (240 Mg) MG tablet Commonly known as: MAG-OX   magnesium  oxide 400 MG tablet Commonly known as: MAG-OX   midodrine  2.5 MG tablet Commonly known as: PROAMATINE    mirtazapine 15 MG tablet Commonly known as: REMERON   pantoprazole  40 MG tablet Commonly known as: PROTONIX    predniSONE 20 MG tablet Commonly known as: DELTASONE   sertraline 25 MG tablet Commonly known as: ZOLOFT   SPS (Sodium Polystyrene Sulf) 15 GM/60ML suspension Generic drug: sodium polystyrene   Tresiba  FlexTouch 200 UNIT/ML FlexTouch Pen Generic drug: insulin  degludec   verapamil 120 MG CR tablet Commonly known as: CALAN-SR   Vitamin D -1000 Max St 25 MCG (1000 UT) tablet Generic drug: Cholecalciferol        TAKE these medications    acetaminophen  325 MG tablet Commonly known as: TYLENOL  Take 650 mg by mouth every morning.   ALPRAZolam  0.25 MG  tablet Commonly known as: XANAX  Take 0.25 mg by mouth at bedtime.   morphine  (PF) 2 MG/ML injection Inject 0.5 mLs (1 mg total) into the vein every hour as needed (increased WOB, SHOB, alleviate signs and symptoms of distress).        Allergies[1]  Consultations: Hospice Cardio    Procedures/Studies: ECHOCARDIOGRAM COMPLETE Result Date: 02/08/2024    ECHOCARDIOGRAM REPORT   Patient Name:   Camila G Illes Date of Exam: 02/08/2024 Medical Rec #:  [id number]       Height:       63.0 in Accession #:    [id number]      Weight:       198.0 lb Date of Birth:  1945/03/26       BSA:          1.925 m Patient Age:    78 years        BP:           105/59 mmHg Patient Gender: F               HR:           112 bpm. Exam Location:  [facility] Procedure: 2D Echo, Color Doppler and Cardiac Doppler (Both Spectral and Color            Flow Doppler were utilized during procedure). Indications:     Atrial Fibrillation I48.91  History:         Patient has prior history of Echocardiogram examinations, most                  recent 03/20/2019.  Sonographer:     Thedora Louder RDCS, FASE Referring Phys:  [id number] AMY N COX Diagnosing Phys: Marsa Dooms MD  Sonographer Comments: Technically challenging study due to limited acoustic windows, no parasternal window, suboptimal apical window and suboptimal subcostal window. Image acquisition challenging due to respiratory motion. The patient has a history of breast cancer making image acquisition very difficult due to breast tenderness. IMPRESSIONS  1. Left ventricular ejection fraction, by estimation, is 60 to 65%. The left ventricle has normal function. The left ventricle has no regional wall motion abnormalities. Left ventricular diastolic parameters are indeterminate.  2. Right ventricular systolic function is normal. The right ventricular size is normal.  3. Moderate pericardial effusion. There is no evidence of cardiac tamponade.  4. The mitral valve is normal in  structure. Trivial mitral valve regurgitation. No evidence of  mitral stenosis.  5. The aortic valve is normal in structure. Aortic valve regurgitation is not visualized. No aortic stenosis is present.  6. The inferior vena cava is normal in size with greater than 50% respiratory variability, suggesting right atrial pressure of 3 mmHg. FINDINGS  Left Ventricle: Left ventricular ejection fraction, by estimation, is 60 to 65%. The left ventricle has normal function. The left ventricle has no regional wall motion abnormalities. Strain was performed and the global longitudinal strain is indeterminate. The left ventricular internal cavity size was normal in size. There is no left ventricular hypertrophy. Left ventricular diastolic parameters are indeterminate. Right Ventricle: The right ventricular size is normal. No increase in right ventricular wall thickness. Right ventricular systolic function is normal. Left Atrium: Left atrial size was normal in size. Right Atrium: Right atrial size was normal in size. Pericardium: A moderately sized pericardial effusion is present. There is no evidence of cardiac tamponade. Mitral Valve: The mitral valve is normal in structure. Trivial mitral valve regurgitation. No evidence of mitral valve stenosis. Tricuspid Valve: The tricuspid valve is normal in structure. Tricuspid valve regurgitation is trivial. No evidence of tricuspid stenosis. Aortic Valve: The aortic valve is normal in structure. Aortic valve regurgitation is not visualized. No aortic stenosis is present. Aortic valve peak gradient measures 6.2 mmHg. Pulmonic Valve: The pulmonic valve was normal in structure. Pulmonic valve regurgitation is not visualized. No evidence of pulmonic stenosis. Aorta: The aortic root is normal in size and structure. Venous: The inferior vena cava is normal in size with greater than 50% respiratory variability, suggesting right atrial pressure of 3 mmHg. IAS/Shunts: No atrial level shunt  detected by color flow Doppler. Additional Comments: 3D was performed not requiring image post processing on an independent workstation and was indeterminate.  AORTIC VALVE AV Vmax:      125.00 cm/s AV Peak Grad: 6.2 mmHg MITRAL VALVE MV Area (PHT): 2.99 cm MV Decel Time: 254 msec MV E velocity: 69.70 cm/s Marsa Dooms MD Electronically signed by Marsa Dooms MD Signature Date/Time: 02/08/2024/10:36:02 AM    Final    DG Chest Port 1 View Result Date: 02/06/2024 CLINICAL DATA:  Chest pain short of breath EXAM: PORTABLE CHEST 1 VIEW COMPARISON:  11/27/2023, chest CT 01/29/2024 FINDINGS: Cardiomegaly with central vascular congestion and mild diffuse interstitial opacity likely underlying edema. Moderate left effusion and persistent dense airspace disease at the left base. Aortic atherosclerosis. IMPRESSION: Cardiomegaly with central vascular congestion and mild diffuse interstitial opacity likely underlying edema. Cardiac enlargement may be in part due to pericardial effusion which was noted on recent CT imaging. Moderate left effusion with persistent dense airspace disease at the left base. Electronically Signed   By: Luke Bun M.D.   On: 02/06/2024 18:56   CT Angio Chest Pulmonary Embolism (PE) W or WO Contrast Result Date: 01/29/2024 CLINICAL DATA:  Shortness of breath EXAM: CT ANGIOGRAPHY CHEST WITH CONTRAST TECHNIQUE: Multidetector CT imaging of the chest was performed using the standard protocol during bolus administration of intravenous contrast. Multiplanar CT image reconstructions and MIPs were obtained to evaluate the vascular anatomy. RADIATION DOSE REDUCTION: This exam was performed according to the departmental dose-optimization program which includes automated exposure control, adjustment of the mA and/or kV according to patient size and/or use of iterative reconstruction technique. CONTRAST:  75mL OMNIPAQUE  IOHEXOL  350 MG/ML SOLN COMPARISON:  CT angiogram chest 10/18/2016  FINDINGS: Cardiovascular: The heart and aorta are normal in size. There is a large pericardial effusion. There is adequate opacification of  the pulmonary arteries to the segmental level. There is no evidence for pulmonary embolism. Mediastinum/Nodes: Thyroid  gland is not seen and surgical clips are present in the thyroid  bed. There are no enlarged mediastinal or hilar lymph nodes. There are calcified right hilar lymph nodes. Visualized esophagus is within normal limits. Lungs/Pleura: There are small bilateral pleural effusions. There is smooth pleural thickening surrounding the left pleural effusion with small locules of air within the effusion on image 5/45. There is atelectasis of the right lower lobe. There is consolidation/collapse throughout the lingula and left lower lobe. Trachea and central airways are patent. Upper Abdomen: There is lobulated liver contour. The gallbladder surgically absent. The spleen is enlarged. Musculoskeletal: Postsurgical changes and scarring are noted in the left breast. There is left breast skin thickening. No acute osseous findings. There are vertebroplasty changes at T12. Review of the MIP images confirms the above findings. IMPRESSION: 1. No evidence for pulmonary embolism. 2. Large pericardial effusion. 3. Small bilateral pleural effusions. 4. Pleural thickening surrounding the left pleural effusion with small locules of air within the effusion. Findings are concerning for empyema. 5. Consolidation/collapse of the lingula and left lower lobe. 6. Right lower lobe atelectasis. 7. Findings compatible with cirrhosis and portal hypertension. Electronically Signed   By: Greig Pique M.D.   On: 01/29/2024 19:03   (Echo, Carotid, EGD, Colonoscopy, ERCP)    Subjective: Pt appears lethargic    Discharge Exam: Vitals:   02/09/24 0500 02/09/24 1003  BP: 136/61 (!) 117/56  Pulse: (!) 112 99  Resp:  17  Temp: 98.1 F (36.7 C) (!) 97.5 F (36.4 C)  SpO2: 93% 93%   Vitals:    02/08/24 1938 02/09/24 0015 02/09/24 0500 02/09/24 1003  BP: 129/61 (!) 119/57 136/61 (!) 117/56  Pulse: (!) 110 (!) 104 (!) 112 99  Resp: 14   17  Temp: (!) 97.5 F (36.4 C) 97.6 F (36.4 C) 98.1 F (36.7 C) (!) 97.5 F (36.4 C)  TempSrc:      SpO2: 95% 94% 93% 93%  Weight:      Height:        General: Pt is lethargic  Cardiovascular: S1/S2 +, no rubs, no gallops Respiratory: decreased breath sounds b/l  Abdominal: Soft, NT,obese, bowel sounds + Extremities: no cyanosis    The results of significant diagnostics from this hospitalization (including imaging, microbiology, ancillary and laboratory) are listed below for reference.     Microbiology: No results found for this or any previous visit (from the past 240 hours).   Labs: BNP (last 3 results) Recent Labs    10/16/23 1952  BNP 94.9   Basic Metabolic Panel: Recent Labs  Lab 02/06/24 1821 02/07/24 0427 02/08/24 0400  NA 134* 135 136  K 5.6* 4.2 4.1  CL 96* 99 100  CO2 23 25 25   GLUCOSE 442* 327* 147*  BUN 54* 56* 59*  CREATININE 1.22* 1.29* 1.52*  CALCIUM  7.6* 7.8* 8.4*   Liver Function Tests: Recent Labs  Lab 02/06/24 1821  AST 60*  ALT 31  ALKPHOS 181*  BILITOT 0.5  PROT 6.9  ALBUMIN  3.3*   No results for input(s): LIPASE, AMYLASE in the last 168 hours. No results for input(s): AMMONIA in the last 168 hours. CBC: Recent Labs  Lab 02/06/24 1821 02/07/24 0427 02/08/24 0400  WBC 11.4* 10.3 12.2*  NEUTROABS 10.4*  --   --   HGB 9.3* 8.6* 9.3*  HCT 29.2* 27.0* 28.7*  MCV 84.9 85.2 83.2  PLT  226 230 239   Cardiac Enzymes: No results for input(s): CKTOTAL, CKMB, CKMBINDEX, TROPONINI in the last 168 hours. BNP: Invalid input(s): POCBNP CBG: Recent Labs  Lab 02/07/24 1511 02/07/24 1624 02/07/24 2137 02/08/24 0759 02/08/24 1201  GLUCAP 169* 158* 255* 147* 284*   D-Dimer No results for input(s): DDIMER in the last 72 hours. Hgb A1c Recent Labs     02/08/24 0400  HGBA1C 6.8*   Lipid Profile No results for input(s): CHOL, HDL, LDLCALC, TRIG, CHOLHDL, LDLDIRECT in the last 72 hours. Thyroid  function studies No results for input(s): TSH, T4TOTAL, T3FREE, THYROIDAB in the last 72 hours.  Invalid input(s): FREET3 Anemia work up No results for input(s): VITAMINB12, FOLATE, FERRITIN, TIBC, IRON , RETICCTPCT in the last 72 hours. Urinalysis    Component Value Date/Time   COLORURINE YELLOW (A) 02/06/2024 1912   APPEARANCEUR CLEAR (A) 02/06/2024 1912   LABSPEC 1.013 02/06/2024 1912   PHURINE 5.0 02/06/2024 1912   GLUCOSEU >=500 (A) 02/06/2024 1912   HGBUR NEGATIVE 02/06/2024 1912   BILIRUBINUR NEGATIVE 02/06/2024 1912   KETONESUR NEGATIVE 02/06/2024 1912   PROTEINUR NEGATIVE 02/06/2024 1912   NITRITE NEGATIVE 02/06/2024 1912   LEUKOCYTESUR NEGATIVE 02/06/2024 1912   Sepsis Labs Recent Labs  Lab 02/06/24 1821 02/07/24 0427 02/08/24 0400  WBC 11.4* 10.3 12.2*   Microbiology No results found for this or any previous visit (from the past 240 hours).   Time coordinating discharge: 36 minutes  SIGNED:   Anthony CHRISTELLA Pouch, MD  [facility] 02/09/2024, 1:17 PM Pager   If 7PM-7AM, please contact night-coverage [email or web address]      [1]  Allergies Allergen Reactions   Statins     Cirrhosis, unable to tolerate

## 2024-02-09 NOTE — Progress Notes (Signed)
 [facility]  SUBJECTIVE: Patient laying in bed, reports slept well last night   Vitals:   02/08/24 1226 02/08/24 1938 02/09/24 0015 02/09/24 0500  BP: (!) 106/46 129/61 (!) 119/57 136/61  Pulse: (!) 105 (!) 110 (!) 104 (!) 112  Resp: 16 14    Temp:  (!) 97.5 F (36.4 C) 97.6 F (36.4 C) 98.1 F (36.7 C)  TempSrc:      SpO2: 95% 95% 94% 93%  Weight:      Height:         Intake/Output Summary (Last 24 hours) at 02/09/2024 0900 Last data filed at 02/08/2024 1836 Gross per 24 hour  Intake 300 ml  Output --  Net 300 ml      PHYSICAL EXAM  General: Well developed, well nourished, in no acute distress HEENT:  Normocephalic and atramatic Neck:  No JVD.  Lungs: Clear bilaterally to auscultation and percussion. Heart: HRRR . Normal S1 and S2 without gallops or murmurs.  Abdomen: Bowel sounds are positive, abdomen soft and non-tender  Msk:  Back normal, normal gait. Normal strength and tone for age. Extremities: No clubbing, cyanosis or edema.   Neuro: Alert and oriented X 3. Psych:  Good affect, responds appropriately   LABS: Basic Metabolic Panel: Recent Labs    02/07/24 0427 02/08/24 0400  NA 135 136  K 4.2 4.1  CL 99 100  CO2 25 25  GLUCOSE 327* 147*  BUN 56* 59*  CREATININE 1.29* 1.52*  CALCIUM  7.8* 8.4*   Liver Function Tests: Recent Labs    02/06/24 1821  AST 60*  ALT 31  ALKPHOS 181*  BILITOT 0.5  PROT 6.9  ALBUMIN  3.3*   No results for input(s): LIPASE, AMYLASE in the last 72 hours. CBC: Recent Labs    02/06/24 1821 02/07/24 0427 02/08/24 0400  WBC 11.4* 10.3 12.2*  NEUTROABS 10.4*  --   --   HGB 9.3* 8.6* 9.3*  HCT 29.2* 27.0* 28.7*  MCV 84.9 85.2 83.2  PLT 226 230 239   Cardiac Enzymes: No results for input(s): CKTOTAL, CKMB, CKMBINDEX, TROPONINI in the last 72 hours. BNP: Invalid input(s): POCBNP D-Dimer: No results for input(s): DDIMER in the last 72 hours. Hemoglobin A1C: Recent Labs    02/08/24 0400   HGBA1C 6.8*   Fasting Lipid Panel: No results for input(s): CHOL, HDL, LDLCALC, TRIG, CHOLHDL, LDLDIRECT in the last 72 hours. Thyroid  Function Tests: No results for input(s): TSH, T4TOTAL, T3FREE, THYROIDAB in the last 72 hours.  Invalid input(s): FREET3 Anemia Panel: No results for input(s): VITAMINB12, FOLATE, FERRITIN, TIBC, IRON , RETICCTPCT in the last 72 hours.  ECHOCARDIOGRAM COMPLETE Result Date: 02/08/2024    ECHOCARDIOGRAM REPORT   Patient Name:   Rebecca Kirby Date of Exam: 02/08/2024 Medical Rec #:  [id number]       Height:       63.0 in Accession #:    [id number]      Weight:       198.0 lb Date of Birth:  June 07, 1945       BSA:          1.925 m Patient Age:    78 years        BP:           105/59 mmHg Patient Gender: F               HR:           112 bpm. Exam Location:  [facility] Procedure: 2D Echo,  Color Doppler and Cardiac Doppler (Both Spectral and Color            Flow Doppler were utilized during procedure). Indications:     Atrial Fibrillation I48.91  History:         Patient has prior history of Echocardiogram examinations, most                  recent 03/20/2019.  Sonographer:     Thedora Louder RDCS, FASE Referring Phys:  [id number] AMY N COX Diagnosing Phys: Marsa Dooms MD  Sonographer Comments: Technically challenging study due to limited acoustic windows, no parasternal window, suboptimal apical window and suboptimal subcostal window. Image acquisition challenging due to respiratory motion. The patient has a history of breast cancer making image acquisition very difficult due to breast tenderness. IMPRESSIONS  1. Left ventricular ejection fraction, by estimation, is 60 to 65%. The left ventricle has normal function. The left ventricle has no regional wall motion abnormalities. Left ventricular diastolic parameters are indeterminate.  2. Right ventricular systolic function is normal. The right ventricular size is normal.  3. Moderate  pericardial effusion. There is no evidence of cardiac tamponade.  4. The mitral valve is normal in structure. Trivial mitral valve regurgitation. No evidence of mitral stenosis.  5. The aortic valve is normal in structure. Aortic valve regurgitation is not visualized. No aortic stenosis is present.  6. The inferior vena cava is normal in size with greater than 50% respiratory variability, suggesting right atrial pressure of 3 mmHg. FINDINGS  Left Ventricle: Left ventricular ejection fraction, by estimation, is 60 to 65%. The left ventricle has normal function. The left ventricle has no regional wall motion abnormalities. Strain was performed and the global longitudinal strain is indeterminate. The left ventricular internal cavity size was normal in size. There is no left ventricular hypertrophy. Left ventricular diastolic parameters are indeterminate. Right Ventricle: The right ventricular size is normal. No increase in right ventricular wall thickness. Right ventricular systolic function is normal. Left Atrium: Left atrial size was normal in size. Right Atrium: Right atrial size was normal in size. Pericardium: A moderately sized pericardial effusion is present. There is no evidence of cardiac tamponade. Mitral Valve: The mitral valve is normal in structure. Trivial mitral valve regurgitation. No evidence of mitral valve stenosis. Tricuspid Valve: The tricuspid valve is normal in structure. Tricuspid valve regurgitation is trivial. No evidence of tricuspid stenosis. Aortic Valve: The aortic valve is normal in structure. Aortic valve regurgitation is not visualized. No aortic stenosis is present. Aortic valve peak gradient measures 6.2 mmHg. Pulmonic Valve: The pulmonic valve was normal in structure. Pulmonic valve regurgitation is not visualized. No evidence of pulmonic stenosis. Aorta: The aortic root is normal in size and structure. Venous: The inferior vena cava is normal in size with greater than 50%  respiratory variability, suggesting right atrial pressure of 3 mmHg. IAS/Shunts: No atrial level shunt detected by color flow Doppler. Additional Comments: 3D was performed not requiring image post processing on an independent workstation and was indeterminate.  AORTIC VALVE AV Vmax:      125.00 cm/s AV Peak Grad: 6.2 mmHg MITRAL VALVE MV Area (PHT): 2.99 cm MV Decel Time: 254 msec MV E velocity: 69.70 cm/s Marsa Dooms MD Electronically signed by Marsa Dooms MD Signature Date/Time: 02/08/2024/10:36:02 AM    Final      Echo technically challenging study, LVEF 60-65%, moderate pericardial effusion without evidence for tamponade  TELEMETRY: Sinus rhythm 72 bpm:  ASSESSMENT AND PLAN:  Principal Problem:   Atrial fibrillation with RVR (HCC) Active Problems:   Hypothyroidism   Obesity (BMI 30-39.9)   HTN (hypertension)   Acute on chronic diastolic CHF (congestive heart failure) (HCC)   Anxiety and depression   Type 2 diabetes mellitus with chronic kidney disease, with long-term current use of insulin  (HCC)   Hyperkalemia   Elevated troponin   Chronic anemia   Pericardial effusion   Elevated serum creatinine    1. Atrial fibrillation with rapid ventricular rate, converted to sinus rhythm on IV Cardizem   2.  Acute on chronic HFpEF, received IV furosemide  3.  Large pericardial effusion on CT scan, in the setting of metastatic breast cancer, appears hemodynamically stable, doubt tamponade.  Patient and daughter not inclined to be aggressive with regard to diagnostic and/or therapeutic pericardiocentesis. 4.  DNR/DNI/comfort care   Recommendations   1.  Agree with overall current therapy 2.  Agree with DNR and comfort care  Sign off for now, please call if any questions   Marsa Dooms, MD, PhD, FACC 02/09/2024 9:00 AM

## 2024-02-09 NOTE — Progress Notes (Signed)
 Pt transferred to [facility] via EMS. Family at bedside at departure. Pt given 1 mg of Morphine  for ambulance ride per request.

## 2024-02-09 NOTE — Progress Notes (Signed)
 Daily Progress Note   Patient Name: Rebecca Kirby       Date: 02/09/2024 DOB: [birth year]  Age: 78 y.o. MRN#: [id number] Attending Physician: Trudy Anthony HERO, MD Primary Care Physician: Auston Reyes BIRCH, MD Admit Date: 02/06/2024  Reason for Consultation/Follow-up: Establishing goals of care  HPI/Brief Hospital Review: 78 y.o. female  with past medical history of colon cancer status post resection, renal cancer status post right nephrectomy, hypertension, hypothyroidism, anxiety, CHF, cirrhosis, chronic and recurrent pleural effusions found to be negative for malignancy and CKD stage III admitted from home on 02/06/2024 with progressive and worsening shortness of breath x 1 week.  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  After many consultations, current malignancy found to be inoperable and incurable at this time. Treatment is palliative focused.  Transitioned to [facility] 12/15   Palliative medicine was consulted for assisting with goals of care conversations.  Subjective: Extensive chart review has been completed prior to meeting patient including labs, vital signs, imaging, progress notes, orders, and available advanced directive documents from current and previous encounters.    Visited with Ms. Haberkorn at her bedside. She is resting in bed comfortably, she opens her eyes to calling of her name. She reports feeling much more relaxed without shortness of breath. She easily drifts back to sleep without redirection. Daughter, SIL and grandson at bedside during time of visit.  Family reports Ms. Latka had a peaceful and restful evening. Review of MAR, current  regimen maintaining comfort-no need to adjust or change at this time.  Saddie Na, RN-ACC Hospice Liaison at bedside. Saddie speaks to family about hospice [facility] for which family is interested. Referral to be placed to hospice physician. Later in day received update that Ms. Lizotte has been approved for hospice [facility] transport. Requested nursing staff medicate Ms. Coonradt prior to EMS transport.  Answered and addressed all questions and concerns.  Objective:  Physical Exam Constitutional:      General: She is not in acute distress.    Appearance: She is ill-appearing.  HENT:     Head: Normocephalic.     Mouth/Throat:     Mouth: Mucous membranes are dry.  Pulmonary:     Effort: Pulmonary effort  is normal. No respiratory distress.  Abdominal:     Palpations: Abdomen is soft.  Skin:    General: Skin is warm and dry.  Neurological:     Mental Status: She is oriented to person, place, and time.     Motor: Weakness present.  Psychiatric:        Mood and Affect: Mood normal.        Behavior: Behavior normal.        Thought Content: Thought content normal.             Vital Signs: BP (!) 141/62 (BP Location: Right Arm)   Pulse 99   Temp 97.9 F (36.6 C)   Resp 18   Ht 5' 2.99 (1.6 m)   Wt 89.8 kg   SpO2 97%   BMI 35.08 kg/m  SpO2: SpO2: 97 % O2 Device: O2 Device: Nasal Cannula O2 Flow Rate: O2 Flow Rate (L/min): 3 L/min   Palliative Care Assessment & Plan   Assessment/Recommendation/Plan  CMO Transfer to IPU  Thank you for allowing the Palliative Medicine Team to assist in the care of this patient.  Visit includes: Detailed review of medical records (labs, imaging, vital signs), medically appropriate exam (mental status, respiratory, cardiac, skin), discussed with treatment team, counseling and educating patient, family and staff, documenting clinical information, medication management and coordination of care.  Waddell Lesches, DNP, AGNP-C Palliative  Medicine   Please contact Palliative Medicine Team phone at [phone] for questions and concerns.

## 2024-02-27 DEATH — deceased
